# Patient Record
Sex: Male | Born: 1950
Health system: Southern US, Community
[De-identification: ages and names within clinical notes are randomized; demographics above are authoritative.]

## PROBLEM LIST (undated history)

## (undated) DIAGNOSIS — J449 Chronic obstructive pulmonary disease, unspecified: Secondary | ICD-10-CM

## (undated) DIAGNOSIS — G8929 Other chronic pain: Secondary | ICD-10-CM

## (undated) DIAGNOSIS — R21 Rash and other nonspecific skin eruption: Secondary | ICD-10-CM

## (undated) DIAGNOSIS — I253 Aneurysm of heart: Secondary | ICD-10-CM

## (undated) DIAGNOSIS — R509 Fever, unspecified: Secondary | ICD-10-CM

## (undated) DIAGNOSIS — R739 Hyperglycemia, unspecified: Secondary | ICD-10-CM

## (undated) DIAGNOSIS — J9601 Acute respiratory failure with hypoxia: Secondary | ICD-10-CM

## (undated) DIAGNOSIS — R1310 Dysphagia, unspecified: Secondary | ICD-10-CM

## (undated) DIAGNOSIS — R570 Cardiogenic shock: Secondary | ICD-10-CM

## (undated) DIAGNOSIS — R0989 Other specified symptoms and signs involving the circulatory and respiratory systems: Secondary | ICD-10-CM

## (undated) DIAGNOSIS — J810 Acute pulmonary edema: Secondary | ICD-10-CM

## (undated) DIAGNOSIS — I5041 Acute combined systolic (congestive) and diastolic (congestive) heart failure: Secondary | ICD-10-CM

## (undated) DIAGNOSIS — D72829 Elevated white blood cell count, unspecified: Secondary | ICD-10-CM

## (undated) DIAGNOSIS — Z9981 Dependence on supplemental oxygen: Secondary | ICD-10-CM

## (undated) DIAGNOSIS — J029 Acute pharyngitis, unspecified: Secondary | ICD-10-CM

## (undated) DIAGNOSIS — R0902 Hypoxemia: Secondary | ICD-10-CM

## (undated) DIAGNOSIS — R410 Disorientation, unspecified: Secondary | ICD-10-CM

## (undated) DIAGNOSIS — E876 Hypokalemia: Secondary | ICD-10-CM

## (undated) DIAGNOSIS — I2109 ST elevation (STEMI) myocardial infarction involving other coronary artery of anterior wall: Secondary | ICD-10-CM

## (undated) DIAGNOSIS — R5381 Other malaise: Secondary | ICD-10-CM

## (undated) DIAGNOSIS — I2510 Atherosclerotic heart disease of native coronary artery without angina pectoris: Secondary | ICD-10-CM

## (undated) DIAGNOSIS — I5022 Chronic systolic (congestive) heart failure: Secondary | ICD-10-CM

## (undated) DIAGNOSIS — Z4509 Encounter for adjustment and management of other cardiac device: Secondary | ICD-10-CM

## (undated) DIAGNOSIS — Z1211 Encounter for screening for malignant neoplasm of colon: Secondary | ICD-10-CM

## (undated) DIAGNOSIS — I213 ST elevation (STEMI) myocardial infarction of unspecified site: Secondary | ICD-10-CM

## (undated) DIAGNOSIS — I238 Other current complications following acute myocardial infarction: Secondary | ICD-10-CM

## (undated) DIAGNOSIS — I10 Essential (primary) hypertension: Secondary | ICD-10-CM

## (undated) DIAGNOSIS — A419 Sepsis, unspecified organism: Secondary | ICD-10-CM

## (undated) DIAGNOSIS — R7881 Bacteremia: Secondary | ICD-10-CM

## (undated) DIAGNOSIS — Z452 Encounter for adjustment and management of vascular access device: Secondary | ICD-10-CM

## (undated) DIAGNOSIS — R0682 Tachypnea, not elsewhere classified: Secondary | ICD-10-CM

## (undated) DIAGNOSIS — G931 Anoxic brain damage, not elsewhere classified: Secondary | ICD-10-CM

## (undated) DIAGNOSIS — M542 Cervicalgia: Secondary | ICD-10-CM

## (undated) DIAGNOSIS — I249 Acute ischemic heart disease, unspecified: Secondary | ICD-10-CM

## (undated) DIAGNOSIS — D62 Acute posthemorrhagic anemia: Secondary | ICD-10-CM

## (undated) DIAGNOSIS — I509 Heart failure, unspecified: Secondary | ICD-10-CM

## (undated) DIAGNOSIS — I952 Hypotension due to drugs: Secondary | ICD-10-CM

## (undated) HISTORY — DX: Atherosclerotic heart disease of native coronary artery without angina pectoris: I25.10

## (undated) HISTORY — DX: ST elevation (STEMI) myocardial infarction of unspecified site: I21.3

## (undated) HISTORY — DX: Dependence on supplemental oxygen: Z99.81

## (undated) HISTORY — DX: Other malaise: R53.81

## (undated) HISTORY — DX: Encounter for screening for malignant neoplasm of colon: Z12.11

## (undated) HISTORY — DX: Bacteremia: R78.81

## (undated) HISTORY — DX: Acute combined systolic (congestive) and diastolic (congestive) heart failure: I50.41

## (undated) HISTORY — DX: Acute ischemic heart disease, unspecified: I24.9

## (undated) HISTORY — DX: Disorientation, unspecified: R41.0

## (undated) HISTORY — DX: Cervicalgia: M54.2

## (undated) HISTORY — DX: Other chronic pain: G89.29

## (undated) HISTORY — DX: Heart failure, unspecified: I50.9

## (undated) HISTORY — DX: Hypokalemia: E87.6

## (undated) HISTORY — DX: Cardiogenic shock: R57.0

## (undated) HISTORY — DX: Elevated white blood cell count, unspecified: D72.829

## (undated) HISTORY — DX: Acute pulmonary edema: J81.0

## (undated) HISTORY — DX: Encounter for adjustment and management of vascular access device: Z45.2

## (undated) HISTORY — DX: Other specified symptoms and signs involving the circulatory and respiratory systems: R09.89

## (undated) HISTORY — DX: Encounter for adjustment and management of other cardiac device: Z45.09

## (undated) HISTORY — PX: CARDIAC CATHETERIZATION: SHX172

## (undated) HISTORY — DX: Hypoxemia: R09.02

## (undated) HISTORY — DX: Dysphagia, unspecified: R13.10

## (undated) HISTORY — DX: Acute pharyngitis, unspecified: J02.9

## (undated) HISTORY — DX: Chronic obstructive pulmonary disease, unspecified: J44.9

## (undated) HISTORY — DX: Chronic systolic (congestive) heart failure: I50.22

## (undated) HISTORY — DX: Acute posthemorrhagic anemia: D62

## (undated) HISTORY — DX: Hyperglycemia, unspecified: R73.9

## (undated) HISTORY — DX: Essential (primary) hypertension: I10

## (undated) HISTORY — DX: Acute respiratory failure with hypoxia: J96.01

## (undated) HISTORY — DX: Sepsis, unspecified organism: A41.9

## (undated) HISTORY — DX: Fever, unspecified: R50.9

## (undated) HISTORY — DX: Anoxic brain damage, not elsewhere classified: G93.1

## (undated) HISTORY — DX: Hypotension due to drugs: I95.2

## (undated) HISTORY — DX: Other current complications following acute myocardial infarction: I23.8

## (undated) HISTORY — DX: ST elevation (STEMI) myocardial infarction involving other coronary artery of anterior wall: I21.09

## (undated) HISTORY — DX: Tachypnea, not elsewhere classified: R06.82

## (undated) HISTORY — DX: Rash and other nonspecific skin eruption: R21

## (undated) HISTORY — DX: Other current complications following acute myocardial infarction: I25.3

---

## 2003-04-03 ENCOUNTER — Emergency Department (HOSPITAL_COMMUNITY): Admission: EM | Admit: 2003-04-03 | Discharge: 2003-04-04 | Payer: Self-pay | Admitting: Emergency Medicine

## 2010-05-11 ENCOUNTER — Inpatient Hospital Stay (HOSPITAL_COMMUNITY)
Admission: EM | Admit: 2010-05-11 | Discharge: 2010-05-16 | Payer: Self-pay | Source: Home / Self Care | Attending: Orthopedic Surgery | Admitting: Orthopedic Surgery

## 2010-06-10 ENCOUNTER — Emergency Department (HOSPITAL_COMMUNITY)
Admission: EM | Admit: 2010-06-10 | Discharge: 2010-06-10 | Payer: Self-pay | Source: Home / Self Care | Admitting: Emergency Medicine

## 2010-07-16 NOTE — Discharge Summary (Signed)
NAMESHAILEN, THIELEN NO.:  [id number]  MEDICAL RECORD NO.:  [id number]          PATIENT TYPE:  INP  LOCATION:  5033                         FACILITY:  [facility]  PHYSICIAN:  Dionne Ano. Rayn Enderson, M.D.DATE OF BIRTH:  [birth year]  DATE OF ADMISSION:  05/11/2010 DATE OF DISCHARGE:  05/16/2010                              DISCHARGE SUMMARY   ADMITTING DIAGNOSES: 1. Severe mangling injury to the left forearm with significant soft     tissue disarray secondary to dog bite. 2. History of hypertension. 3. History of tobacco abuse. 4. History of chronic back pain. 5. History of atypical chest pain with questionable history of chronic     obstructive pulmonary disease.  SURGEON:  Dionne Ano. Amanda Pea, MD  CONSULTATIONS:  [facility] per Dr. Darnelle Catalan.  PROCEDURE:  Multiple incision and debridements of the forearm with ultimate full thickness skin grafting to the significant soft tissue defects.  BRIEF HISTORY OF PRESENT ILLNESS:  Mr. Desroches is a very pleasant gentleman who was seen and evaluated at request of the emergency room at [facility]  He is a pleasant 60 year old gentleman who unfortunately sustained a severe dog bite to the left forearm who had notable soft tissue disarray present.  This was a severe mangling injury.  Given the severity of hand and upper extremity, surgeon Dominica Severin was asked to see in regards to hand surgery consultation.  The patient's past medical history was updated given this significant soft tissue disarray. We discussed with him the recommendations for urgent I and D, admittance to the hospital to prevent infection, and possibility for repeat I and Ds into the future and ultimate closure with possible skin grafting. Given his past medical history, [facility] was consulted for medical clearance and did follow him along during his inpatient admit. Preoperative labs in terms of hemogram were noted to be stable without any  significant abnormalities.  Routine chemistry was relatively within normal limits.  EKG was performed with normal sinus rhythm noted without acute abnormalities present.  Radiographs showed soft tissue defects about the forearm.  No obvious bony abnormalities.  Preoperative chest x- ray showed minimal bibasilar atelectasis, mild peribronchial thickening, otherwise lungs were noted to be clear.  HOSPITAL COURSE:  Mr. Veltre was admitted on May 12, 2011 with the above-mentioned diagnosis.  Given the severe soft tissue injury, he was taken urgently to the emergency room where he underwent irrigation and excisional debridement of the significant soft tissue disarray.  He was admitted to the hospital for IV antibiotics, pain management, and for tentative reconstruction to be performed to the upper extremity.  He did very well during his postop period.  On postop day #1, he was doing well.  Vital signs were stable.  He was afebrile.  He was tolerating p.o.'s without difficulty.  He had expected dysesthesia of the upper extremity given the significant soft tissue disarray.  His chest was clear to auscultation.  Heart was regular rate and rhythm.  The vacuum- assisted closure device was noted be intact which was placed intraoperatively and was functioning without difficulties.  Therapy had been contacted for ambulatory purposes  and ADLs.  Decision was made to proceed to the operative room for repeat I and D to the upper extremity. The patient medically was stable and control of his hypertensive issues was noted on atenolol 50 mg daily.  On May 14, 2010, he returned to the operative suite undergoing second I and D with closure utilizing split-thickness skin graft to the operative site.  He tolerated the procedure very well and there were no complication.  Continued admittance was noted for IV antibiotics and continued wound care.  The patient remained medically stable.  On May 16, 2010, he was doing very well, was not having difficulties, his pain was controlled, he was tolerating p.o.'s without difficulties, he was voiding without difficulty, and was noted to have a bowel movement.  He denied nausea, vomiting, fevers, chills, shortness of breath, or chest pain.  His head was atraumatic and normocephalic.  Chest was clear to auscultation bilaterally.  Heart was S1-S2.  Abdomen was soft and nontender.  Bowel sounds were positive.  Evaluation of the upper extremity showed neurovascularly he was intact.  Median nerve appeared intact.  He was noted to have dysesthesias about superficial radial nerve and lateral antebrachial cutaneous regions.  He had no signs of infection or cellulitis.  Evaluation of the left thigh where the split-thickness skin graft was harvested showed that the dressings were intact.  No cellulitic process was noted.  He had been performing hair dryer treatment as directed throughout his hospital stay without difficulties. The wound margins were drying appropriately  ASSESSMENT: 1. Status post severe mangling injury to the left forearm secondary to     a dog bite requiring multiple incision and debridements and stayed     reconstruction of left forearm with subsequent split thickness skin     graft supplied from the thigh to the forearm. 2. History of hypertension with prior poor compliance, currently     controlled while inpatient on metoprolol. 3. Questionable history of chronic obstructive pulmonary disease. 4. History of chronic low back pain.  PLAN:  Decision was made to discharge Mr. [name] home.  CONDITION ON DISCHARGE:  He was noted to be improved.  We recommended a DASH diet, increasing his sodium, increasing fiber, fruits, and vegetables, and decreasing his fat foods.  In regards to the activities of the left upper extremities, he will keep his dressings clean, dry, and intact, elevating his hand frequently and wearing his sling  when he is ambulatory.  DISCHARGE MEDICATIONS: 1. Augmentin 875 mg 1 p.o. b.i.d. for 10 days. 2. Percocet 5/325 one to two p.o. q.4-6 p.r.n. #50. 3. Atenolol 50 mg 1 p.o. daily with 2 refills.  DISCHARGE INSTRUCTIONS:  He does understand he will have to establish a followup care with his family physician for further refills.  In regards to wound care, he is going to change his dressings daily, utilizing air dryer treatments as directed 10-15 minutes 4-5 times daily.  He is going to follow up with Dr. Amanda Pea next Friday afternoon at 1:15 to take his dressings down since his wound looks good. We discussed the need to follow up with Dr. Madaline Guthrie for any cardiac issues and hypertensive issues at which as he had seen him in the distant past.  All questions were encouraged and answered.     Karie Chimera, P.A.-C.   ______________________________ Dionne Ano. Amanda Pea, M.D.    BB/MEDQ  D:  06/30/2010  T:  07/01/2010  Job:  [id number]  Electronically Signed by Karie Chimera  P.A.-C. on 07/12/2010 01:06:12 PM Electronically Signed by Dominica Severin M.D. on 07/16/2010 05:54:03 AM

## 2010-08-09 LAB — CBC
HCT: 42.6 % (ref 39.0–52.0)
Hemoglobin: 14.1 g/dL (ref 13.0–17.0)
MCH: 30 pg (ref 26.0–34.0)
MCHC: 33.1 g/dL (ref 30.0–36.0)
MCV: 90.6 fL (ref 78.0–100.0)
Platelets: 213 10*3/uL (ref 150–400)
RBC: 4.7 MIL/uL (ref 4.22–5.81)
RDW: 13.5 % (ref 11.5–15.5)
WBC: 11.1 10*3/uL — ABNORMAL HIGH (ref 4.0–10.5)

## 2010-08-09 LAB — BASIC METABOLIC PANEL
BUN: 7 mg/dL (ref 6–23)
CO2: 26 mEq/L (ref 19–32)
Calcium: 8.9 mg/dL (ref 8.4–10.5)
Chloride: 108 mEq/L (ref 96–112)
Creatinine, Ser: 0.92 mg/dL (ref 0.4–1.5)
GFR calc Af Amer: >60 mL/min (ref >60)
GFR calc non Af Amer: >60 mL/min (ref >60)
Glucose, Bld: 105 mg/dL — ABNORMAL HIGH (ref 70–99)
Potassium: 3.8 mEq/L (ref 3.5–5.1)
Sodium: 141 mEq/L (ref 135–145)

## 2010-08-10 LAB — DIFFERENTIAL
Basophils Absolute: 0 10*3/uL (ref 0.0–0.1)
Basophils Relative: 0 % (ref 0–1)
Eosinophils Absolute: 0.1 10*3/uL (ref 0.0–0.7)
Eosinophils Relative: 1 % (ref 0–5)
Lymphocytes Relative: 13 % (ref 12–46)
Lymphs Abs: 1.5 10*3/uL (ref 0.7–4.0)
Monocytes Absolute: 0.7 10*3/uL (ref 0.1–1.0)
Monocytes Relative: 6 % (ref 3–12)
Neutro Abs: 9.5 10*3/uL — ABNORMAL HIGH (ref 1.7–7.7)
Neutrophils Relative %: 80 % — ABNORMAL HIGH (ref 43–77)

## 2010-08-10 LAB — BASIC METABOLIC PANEL
BUN: 9 mg/dL (ref 6–23)
CO2: 24 mEq/L (ref 19–32)
Calcium: 9 mg/dL (ref 8.4–10.5)
Chloride: 104 mEq/L (ref 96–112)
Creatinine, Ser: 0.9 mg/dL (ref 0.4–1.5)
GFR calc Af Amer: >60 mL/min (ref >60)
GFR calc non Af Amer: >60 mL/min (ref >60)
Glucose, Bld: 106 mg/dL — ABNORMAL HIGH (ref 70–99)
Potassium: 4.5 mEq/L (ref 3.5–5.1)
Sodium: 134 mEq/L — ABNORMAL LOW (ref 135–145)

## 2010-08-10 LAB — CBC
HCT: 50.8 % (ref 39.0–52.0)
Hemoglobin: 16.9 g/dL (ref 13.0–17.0)
MCH: 30 pg (ref 26.0–34.0)
MCHC: 33.3 g/dL (ref 30.0–36.0)
MCV: 90.2 fL (ref 78.0–100.0)
Platelets: 212 10*3/uL (ref 150–400)
RBC: 5.63 MIL/uL (ref 4.22–5.81)
RDW: 13.6 % (ref 11.5–15.5)
WBC: 11.8 10*3/uL — ABNORMAL HIGH (ref 4.0–10.5)

## 2010-08-10 LAB — PROTIME-INR
INR: 1.05 (ref 0.00–1.49)
Prothrombin Time: 13.9 seconds (ref 11.6–15.2)

## 2010-08-10 LAB — APTT: aPTT: 31 seconds (ref 24–37)

## 2017-04-11 ENCOUNTER — Encounter (HOSPITAL_COMMUNITY): Payer: Self-pay | Admitting: Emergency Medicine

## 2017-04-11 ENCOUNTER — Emergency Department (HOSPITAL_COMMUNITY): Payer: Medicare Other

## 2017-04-11 ENCOUNTER — Inpatient Hospital Stay (HOSPITAL_COMMUNITY)
Admission: EM | Disposition: A | Discharge status: Rehabilitation Facility | Payer: Self-pay | Source: Home / Self Care | Attending: Cardiothoracic Surgery

## 2017-04-11 ENCOUNTER — Inpatient Hospital Stay (HOSPITAL_COMMUNITY)
Admission: EM | Admit: 2017-04-11 | Discharge: 2017-04-27 | DRG: 001 | Disposition: A | Discharge status: Rehabilitation Facility | Payer: Medicare Other | Attending: Cardiothoracic Surgery | Admitting: Cardiothoracic Surgery

## 2017-04-11 ENCOUNTER — Other Ambulatory Visit: Payer: Self-pay

## 2017-04-11 DIAGNOSIS — I249 Acute ischemic heart disease, unspecified: Secondary | ICD-10-CM

## 2017-04-11 DIAGNOSIS — I110 Hypertensive heart disease with heart failure: Secondary | ICD-10-CM | POA: Diagnosis present

## 2017-04-11 DIAGNOSIS — R410 Disorientation, unspecified: Secondary | ICD-10-CM | POA: Diagnosis not present

## 2017-04-11 DIAGNOSIS — N183 Chronic kidney disease, stage 3 (moderate): Secondary | ICD-10-CM

## 2017-04-11 DIAGNOSIS — R509 Fever, unspecified: Secondary | ICD-10-CM | POA: Diagnosis not present

## 2017-04-11 DIAGNOSIS — F17210 Nicotine dependence, cigarettes, uncomplicated: Secondary | ICD-10-CM | POA: Diagnosis present

## 2017-04-11 DIAGNOSIS — Z720 Tobacco use: Secondary | ICD-10-CM

## 2017-04-11 DIAGNOSIS — I5023 Acute on chronic systolic (congestive) heart failure: Secondary | ICD-10-CM | POA: Diagnosis present

## 2017-04-11 DIAGNOSIS — I252 Old myocardial infarction: Secondary | ICD-10-CM

## 2017-04-11 DIAGNOSIS — R0682 Tachypnea, not elsewhere classified: Secondary | ICD-10-CM | POA: Diagnosis not present

## 2017-04-11 DIAGNOSIS — Z9689 Presence of other specified functional implants: Secondary | ICD-10-CM

## 2017-04-11 DIAGNOSIS — M542 Cervicalgia: Secondary | ICD-10-CM | POA: Diagnosis not present

## 2017-04-11 DIAGNOSIS — J810 Acute pulmonary edema: Secondary | ICD-10-CM

## 2017-04-11 DIAGNOSIS — R7881 Bacteremia: Secondary | ICD-10-CM | POA: Diagnosis not present

## 2017-04-11 DIAGNOSIS — Z9861 Coronary angioplasty status: Secondary | ICD-10-CM | POA: Diagnosis not present

## 2017-04-11 DIAGNOSIS — I2510 Atherosclerotic heart disease of native coronary artery without angina pectoris: Secondary | ICD-10-CM | POA: Diagnosis not present

## 2017-04-11 DIAGNOSIS — I480 Paroxysmal atrial fibrillation: Secondary | ICD-10-CM | POA: Diagnosis not present

## 2017-04-11 DIAGNOSIS — A419 Sepsis, unspecified organism: Secondary | ICD-10-CM

## 2017-04-11 DIAGNOSIS — J9811 Atelectasis: Secondary | ICD-10-CM | POA: Diagnosis not present

## 2017-04-11 DIAGNOSIS — R451 Restlessness and agitation: Secondary | ICD-10-CM | POA: Diagnosis not present

## 2017-04-11 DIAGNOSIS — R918 Other nonspecific abnormal finding of lung field: Secondary | ICD-10-CM | POA: Diagnosis not present

## 2017-04-11 DIAGNOSIS — J9690 Respiratory failure, unspecified, unspecified whether with hypoxia or hypercapnia: Secondary | ICD-10-CM | POA: Diagnosis not present

## 2017-04-11 DIAGNOSIS — Y838 Other surgical procedures as the cause of abnormal reaction of the patient, or of later complication, without mention of misadventure at the time of the procedure: Secondary | ICD-10-CM | POA: Diagnosis not present

## 2017-04-11 DIAGNOSIS — G9349 Other encephalopathy: Secondary | ICD-10-CM | POA: Diagnosis present

## 2017-04-11 DIAGNOSIS — I238 Other current complications following acute myocardial infarction: Secondary | ICD-10-CM | POA: Diagnosis not present

## 2017-04-11 DIAGNOSIS — G931 Anoxic brain damage, not elsewhere classified: Secondary | ICD-10-CM | POA: Diagnosis present

## 2017-04-11 DIAGNOSIS — I2102 ST elevation (STEMI) myocardial infarction involving left anterior descending coronary artery: Secondary | ICD-10-CM

## 2017-04-11 DIAGNOSIS — Z4509 Encounter for adjustment and management of other cardiac device: Secondary | ICD-10-CM | POA: Diagnosis not present

## 2017-04-11 DIAGNOSIS — I509 Heart failure, unspecified: Secondary | ICD-10-CM | POA: Diagnosis not present

## 2017-04-11 DIAGNOSIS — J449 Chronic obstructive pulmonary disease, unspecified: Secondary | ICD-10-CM | POA: Diagnosis not present

## 2017-04-11 DIAGNOSIS — R4587 Impulsiveness: Secondary | ICD-10-CM | POA: Diagnosis not present

## 2017-04-11 DIAGNOSIS — J438 Other emphysema: Secondary | ICD-10-CM | POA: Diagnosis not present

## 2017-04-11 DIAGNOSIS — I2582 Chronic total occlusion of coronary artery: Secondary | ICD-10-CM | POA: Diagnosis not present

## 2017-04-11 DIAGNOSIS — Z4682 Encounter for fitting and adjustment of non-vascular catheter: Secondary | ICD-10-CM | POA: Diagnosis not present

## 2017-04-11 DIAGNOSIS — I952 Hypotension due to drugs: Secondary | ICD-10-CM | POA: Diagnosis not present

## 2017-04-11 DIAGNOSIS — Z955 Presence of coronary angioplasty implant and graft: Secondary | ICD-10-CM | POA: Diagnosis not present

## 2017-04-11 DIAGNOSIS — I4901 Ventricular fibrillation: Secondary | ICD-10-CM | POA: Diagnosis not present

## 2017-04-11 DIAGNOSIS — Z481 Encounter for planned postprocedural wound closure: Secondary | ICD-10-CM | POA: Diagnosis not present

## 2017-04-11 DIAGNOSIS — R011 Cardiac murmur, unspecified: Secondary | ICD-10-CM | POA: Diagnosis not present

## 2017-04-11 DIAGNOSIS — R739 Hyperglycemia, unspecified: Secondary | ICD-10-CM | POA: Diagnosis present

## 2017-04-11 DIAGNOSIS — J9383 Other pneumothorax: Secondary | ICD-10-CM | POA: Diagnosis not present

## 2017-04-11 DIAGNOSIS — G8929 Other chronic pain: Secondary | ICD-10-CM

## 2017-04-11 DIAGNOSIS — B9620 Unspecified Escherichia coli [E. coli] as the cause of diseases classified elsewhere: Secondary | ICD-10-CM | POA: Diagnosis not present

## 2017-04-11 DIAGNOSIS — Z1612 Extended spectrum beta lactamase (ESBL) resistance: Secondary | ICD-10-CM | POA: Diagnosis not present

## 2017-04-11 DIAGNOSIS — I313 Pericardial effusion (noninflammatory): Secondary | ICD-10-CM | POA: Diagnosis not present

## 2017-04-11 DIAGNOSIS — I2101 ST elevation (STEMI) myocardial infarction involving left main coronary artery: Secondary | ICD-10-CM | POA: Diagnosis not present

## 2017-04-11 DIAGNOSIS — R55 Syncope and collapse: Secondary | ICD-10-CM | POA: Diagnosis not present

## 2017-04-11 DIAGNOSIS — T82868A Thrombosis of vascular prosthetic devices, implants and grafts, initial encounter: Secondary | ICD-10-CM | POA: Diagnosis not present

## 2017-04-11 DIAGNOSIS — R0902 Hypoxemia: Secondary | ICD-10-CM | POA: Diagnosis not present

## 2017-04-11 DIAGNOSIS — J9601 Acute respiratory failure with hypoxia: Secondary | ICD-10-CM

## 2017-04-11 DIAGNOSIS — E871 Hypo-osmolality and hyponatremia: Secondary | ICD-10-CM | POA: Diagnosis not present

## 2017-04-11 DIAGNOSIS — A499 Bacterial infection, unspecified: Secondary | ICD-10-CM | POA: Diagnosis not present

## 2017-04-11 DIAGNOSIS — R0989 Other specified symptoms and signs involving the circulatory and respiratory systems: Secondary | ICD-10-CM | POA: Diagnosis not present

## 2017-04-11 DIAGNOSIS — I080 Rheumatic disorders of both mitral and aortic valves: Secondary | ICD-10-CM | POA: Diagnosis present

## 2017-04-11 DIAGNOSIS — N170 Acute kidney failure with tubular necrosis: Secondary | ICD-10-CM | POA: Diagnosis not present

## 2017-04-11 DIAGNOSIS — E874 Mixed disorder of acid-base balance: Secondary | ICD-10-CM | POA: Diagnosis not present

## 2017-04-11 DIAGNOSIS — E1165 Type 2 diabetes mellitus with hyperglycemia: Secondary | ICD-10-CM | POA: Diagnosis not present

## 2017-04-11 DIAGNOSIS — Z09 Encounter for follow-up examination after completed treatment for conditions other than malignant neoplasm: Secondary | ICD-10-CM

## 2017-04-11 DIAGNOSIS — R9431 Abnormal electrocardiogram [ECG] [EKG]: Secondary | ICD-10-CM | POA: Diagnosis not present

## 2017-04-11 DIAGNOSIS — R7989 Other specified abnormal findings of blood chemistry: Secondary | ICD-10-CM | POA: Diagnosis not present

## 2017-04-11 DIAGNOSIS — I233 Rupture of cardiac wall without hemopericardium as current complication following acute myocardial infarction: Secondary | ICD-10-CM | POA: Diagnosis not present

## 2017-04-11 DIAGNOSIS — I253 Aneurysm of heart: Secondary | ICD-10-CM | POA: Diagnosis not present

## 2017-04-11 DIAGNOSIS — Z1211 Encounter for screening for malignant neoplasm of colon: Secondary | ICD-10-CM

## 2017-04-11 DIAGNOSIS — R1310 Dysphagia, unspecified: Secondary | ICD-10-CM | POA: Diagnosis not present

## 2017-04-11 DIAGNOSIS — I10 Essential (primary) hypertension: Secondary | ICD-10-CM | POA: Diagnosis not present

## 2017-04-11 DIAGNOSIS — I5041 Acute combined systolic (congestive) and diastolic (congestive) heart failure: Secondary | ICD-10-CM

## 2017-04-11 DIAGNOSIS — J029 Acute pharyngitis, unspecified: Secondary | ICD-10-CM | POA: Diagnosis not present

## 2017-04-11 DIAGNOSIS — Z978 Presence of other specified devices: Secondary | ICD-10-CM

## 2017-04-11 DIAGNOSIS — J811 Chronic pulmonary edema: Secondary | ICD-10-CM | POA: Diagnosis not present

## 2017-04-11 DIAGNOSIS — D62 Acute posthemorrhagic anemia: Secondary | ICD-10-CM | POA: Diagnosis not present

## 2017-04-11 DIAGNOSIS — I340 Nonrheumatic mitral (valve) insufficiency: Secondary | ICD-10-CM | POA: Diagnosis not present

## 2017-04-11 DIAGNOSIS — Z951 Presence of aortocoronary bypass graft: Secondary | ICD-10-CM | POA: Diagnosis not present

## 2017-04-11 DIAGNOSIS — N390 Urinary tract infection, site not specified: Secondary | ICD-10-CM | POA: Diagnosis not present

## 2017-04-11 DIAGNOSIS — J90 Pleural effusion, not elsewhere classified: Secondary | ICD-10-CM

## 2017-04-11 DIAGNOSIS — R29898 Other symptoms and signs involving the musculoskeletal system: Secondary | ICD-10-CM | POA: Diagnosis not present

## 2017-04-11 DIAGNOSIS — R5381 Other malaise: Secondary | ICD-10-CM | POA: Diagnosis not present

## 2017-04-11 DIAGNOSIS — I5043 Acute on chronic combined systolic (congestive) and diastolic (congestive) heart failure: Secondary | ICD-10-CM | POA: Diagnosis not present

## 2017-04-11 DIAGNOSIS — E876 Hypokalemia: Secondary | ICD-10-CM | POA: Diagnosis present

## 2017-04-11 DIAGNOSIS — I2109 ST elevation (STEMI) myocardial infarction involving other coronary artery of anterior wall: Secondary | ICD-10-CM | POA: Diagnosis not present

## 2017-04-11 DIAGNOSIS — R0602 Shortness of breath: Secondary | ICD-10-CM | POA: Diagnosis not present

## 2017-04-11 DIAGNOSIS — F419 Anxiety disorder, unspecified: Secondary | ICD-10-CM | POA: Diagnosis not present

## 2017-04-11 DIAGNOSIS — D72829 Elevated white blood cell count, unspecified: Secondary | ICD-10-CM | POA: Diagnosis not present

## 2017-04-11 DIAGNOSIS — I97190 Other postprocedural cardiac functional disturbances following cardiac surgery: Secondary | ICD-10-CM | POA: Diagnosis not present

## 2017-04-11 DIAGNOSIS — G9340 Encephalopathy, unspecified: Secondary | ICD-10-CM | POA: Diagnosis not present

## 2017-04-11 DIAGNOSIS — I5022 Chronic systolic (congestive) heart failure: Secondary | ICD-10-CM | POA: Diagnosis present

## 2017-04-11 DIAGNOSIS — E872 Acidosis: Secondary | ICD-10-CM | POA: Diagnosis not present

## 2017-04-11 DIAGNOSIS — Z9289 Personal history of other medical treatment: Secondary | ICD-10-CM

## 2017-04-11 DIAGNOSIS — I5021 Acute systolic (congestive) heart failure: Secondary | ICD-10-CM | POA: Diagnosis not present

## 2017-04-11 DIAGNOSIS — R570 Cardiogenic shock: Secondary | ICD-10-CM | POA: Diagnosis present

## 2017-04-11 DIAGNOSIS — R21 Rash and other nonspecific skin eruption: Secondary | ICD-10-CM | POA: Diagnosis not present

## 2017-04-11 DIAGNOSIS — Z9981 Dependence on supplemental oxygen: Secondary | ICD-10-CM | POA: Diagnosis not present

## 2017-04-11 DIAGNOSIS — I161 Hypertensive emergency: Secondary | ICD-10-CM

## 2017-04-11 DIAGNOSIS — Z4659 Encounter for fitting and adjustment of other gastrointestinal appliance and device: Secondary | ICD-10-CM

## 2017-04-11 DIAGNOSIS — D696 Thrombocytopenia, unspecified: Secondary | ICD-10-CM | POA: Diagnosis not present

## 2017-04-11 DIAGNOSIS — I2111 ST elevation (STEMI) myocardial infarction involving right coronary artery: Secondary | ICD-10-CM | POA: Diagnosis not present

## 2017-04-11 DIAGNOSIS — I213 ST elevation (STEMI) myocardial infarction of unspecified site: Secondary | ICD-10-CM

## 2017-04-11 DIAGNOSIS — Z452 Encounter for adjustment and management of vascular access device: Secondary | ICD-10-CM | POA: Diagnosis not present

## 2017-04-11 DIAGNOSIS — I081 Rheumatic disorders of both mitral and tricuspid valves: Secondary | ICD-10-CM | POA: Diagnosis not present

## 2017-04-11 DIAGNOSIS — I519 Heart disease, unspecified: Secondary | ICD-10-CM | POA: Diagnosis not present

## 2017-04-11 HISTORY — PX: CORONARY THROMBECTOMY: CATH118304

## 2017-04-11 HISTORY — PX: CORONARY STENT INTERVENTION: CATH118234

## 2017-04-11 HISTORY — DX: Atherosclerotic heart disease of native coronary artery without angina pectoris: I25.10

## 2017-04-11 HISTORY — DX: Essential (primary) hypertension: I10

## 2017-04-11 HISTORY — PX: ULTRASOUND GUIDANCE FOR VASCULAR ACCESS: SHX6516

## 2017-04-11 HISTORY — PX: IABP INSERTION: CATH118242

## 2017-04-11 HISTORY — DX: ST elevation (STEMI) myocardial infarction of unspecified site: I21.3

## 2017-04-11 HISTORY — PX: CORONARY/GRAFT ACUTE MI REVASCULARIZATION: CATH118305

## 2017-04-11 HISTORY — DX: Chronic obstructive pulmonary disease, unspecified: J44.9

## 2017-04-11 HISTORY — PX: LEFT HEART CATH AND CORONARY ANGIOGRAPHY: CATH118249

## 2017-04-11 HISTORY — DX: Acute combined systolic (congestive) and diastolic (congestive) heart failure: I50.41

## 2017-04-11 LAB — PROTIME-INR
INR: 1.28
Prothrombin Time: 15.9 seconds — ABNORMAL HIGH (ref 11.4–15.2)

## 2017-04-11 LAB — CBC WITH DIFFERENTIAL/PLATELET
Basophils Absolute: 0.1 10*3/uL (ref 0.0–0.1)
Basophils Relative: 1 %
Eosinophils Absolute: 0.3 10*3/uL (ref 0.0–0.7)
Eosinophils Relative: 2 %
HCT: 46.7 % (ref 39.0–52.0)
Hemoglobin: 14.5 g/dL (ref 13.0–17.0)
Lymphocytes Relative: 40 %
Lymphs Abs: 5.5 10*3/uL — ABNORMAL HIGH (ref 0.7–4.0)
MCH: 28.9 pg (ref 26.0–34.0)
MCHC: 31 g/dL (ref 30.0–36.0)
MCV: 93 fL (ref 78.0–100.0)
Monocytes Absolute: 0.8 10*3/uL (ref 0.1–1.0)
Monocytes Relative: 6 %
Neutro Abs: 7.2 10*3/uL (ref 1.7–7.7)
Neutrophils Relative %: 51 %
Platelets: 235 10*3/uL (ref 150–400)
RBC: 5.02 MIL/uL (ref 4.22–5.81)
RDW: 14.3 % (ref 11.5–15.5)
WBC: 13.9 10*3/uL — ABNORMAL HIGH (ref 4.0–10.5)

## 2017-04-11 LAB — COMPREHENSIVE METABOLIC PANEL
ALT: 20 U/L (ref 17–63)
AST: 32 U/L (ref 15–41)
Albumin: 3.4 g/dL — ABNORMAL LOW (ref 3.5–5.0)
Alkaline Phosphatase: 86 U/L (ref 38–126)
Anion gap: 12 (ref 5–15)
BUN: 19 mg/dL (ref 6–20)
CO2: 19 mmol/L — ABNORMAL LOW (ref 22–32)
Calcium: 8.4 mg/dL — ABNORMAL LOW (ref 8.9–10.3)
Chloride: 107 mmol/L (ref 101–111)
Creatinine, Ser: 1.67 mg/dL — ABNORMAL HIGH (ref 0.61–1.24)
GFR calc Af Amer: 47 mL/min — ABNORMAL LOW (ref >60)
GFR calc non Af Amer: 40 mL/min — ABNORMAL LOW (ref >60)
Glucose, Bld: 249 mg/dL — ABNORMAL HIGH (ref 65–99)
Potassium: 4 mmol/L (ref 3.5–5.1)
Sodium: 138 mmol/L (ref 135–145)
Total Bilirubin: 0.5 mg/dL (ref 0.3–1.2)
Total Protein: 6.9 g/dL (ref 6.5–8.1)

## 2017-04-11 LAB — LIPID PANEL
Cholesterol: 167 mg/dL (ref 0–200)
HDL: 27 mg/dL — ABNORMAL LOW (ref >40)
LDL Cholesterol: 118 mg/dL — ABNORMAL HIGH (ref 0–99)
Total CHOL/HDL Ratio: 6.2 RATIO
Triglycerides: 109 mg/dL (ref <150)
VLDL: 22 mg/dL (ref 0–40)

## 2017-04-11 LAB — TROPONIN I: Troponin I: 0.09 ng/mL (ref <0.03)

## 2017-04-11 LAB — APTT: aPTT: 30 seconds (ref 24–36)

## 2017-04-11 SURGERY — CORONARY/GRAFT ACUTE MI REVASCULARIZATION
Anesthesia: LOCAL

## 2017-04-11 MED ORDER — LIDOCAINE HCL (PF) 1 % IJ SOLN
INTRAMUSCULAR | Status: AC
  Filled 2017-04-11: qty 30

## 2017-04-11 MED ORDER — NITROGLYCERIN 1 MG/10 ML FOR IR/CATH LAB
INTRA_ARTERIAL | Status: AC
  Filled 2017-04-11: qty 10

## 2017-04-11 MED ORDER — HEPARIN (PORCINE) IN NACL 2-0.9 UNIT/ML-% IJ SOLN
INTRAMUSCULAR | Status: AC | PRN
  Administered 2017-04-11: 500 mL

## 2017-04-11 MED ORDER — MIDAZOLAM HCL 2 MG/2ML IJ SOLN
1.0000 mg | INTRAMUSCULAR | Status: DC | PRN
  Administered 2017-04-12: 1 mg via INTRAVENOUS
  Filled 2017-04-11: qty 2

## 2017-04-11 MED ORDER — LIDOCAINE HCL (PF) 1 % IJ SOLN
INTRAMUSCULAR | Status: DC | PRN
  Administered 2017-04-11: 20 mL

## 2017-04-11 MED ORDER — MIDAZOLAM HCL 2 MG/2ML IJ SOLN
INTRAMUSCULAR | Status: AC
  Filled 2017-04-11: qty 2

## 2017-04-11 MED ORDER — MIDAZOLAM HCL 2 MG/2ML IJ SOLN
INTRAMUSCULAR | Status: DC | PRN
  Administered 2017-04-11 (×3): 2 mg via INTRAVENOUS

## 2017-04-11 MED ORDER — ADENOSINE 6 MG/2ML IV SOLN
INTRAVENOUS | Status: AC
  Filled 2017-04-11: qty 2

## 2017-04-11 MED ORDER — SODIUM CHLORIDE 0.9 % IV SOLN
INTRAVENOUS | Status: AC | PRN
  Administered 2017-04-11: 10 mL/h via INTRAVENOUS

## 2017-04-11 MED ORDER — FENTANYL BOLUS VIA INFUSION
25.0000 ug | INTRAVENOUS | Status: DC | PRN
  Filled 2017-04-11: qty 25

## 2017-04-11 MED ORDER — ADENOSINE (DIAGNOSTIC) FOR INTRACORONARY USE
INTRAVENOUS | Status: DC | PRN
  Administered 2017-04-11 (×8): 60 mg via INTRACORONARY

## 2017-04-11 MED ORDER — ASPIRIN 81 MG PO CHEW: 324.0000 mg | CHEWABLE_TABLET | Freq: Once | ORAL | Status: DC

## 2017-04-11 MED ORDER — ETOMIDATE 2 MG/ML IV SOLN
20.0000 mg | Freq: Once | INTRAVENOUS | Status: AC
  Administered 2017-04-11: 20 mg via INTRAVENOUS

## 2017-04-11 MED ORDER — ROCURONIUM BROMIDE 50 MG/5ML IV SOLN
75.0000 mg | Freq: Once | INTRAVENOUS | Status: AC
  Administered 2017-04-11: 75 mg via INTRAVENOUS

## 2017-04-11 MED ORDER — BIVALIRUDIN TRIFLUOROACETATE 250 MG IV SOLR
INTRAVENOUS | Status: AC
  Filled 2017-04-11: qty 250

## 2017-04-11 MED ORDER — FENTANYL 2500MCG IN NS 250ML (10MCG/ML) PREMIX INFUSION
25.0000 ug/h | INTRAVENOUS | Status: DC
  Administered 2017-04-11: 50 ug/h via INTRAVENOUS
  Administered 2017-04-12: 200 ug/h via INTRAVENOUS
  Filled 2017-04-11 (×2): qty 250

## 2017-04-11 MED ORDER — NITROGLYCERIN IN D5W 200-5 MCG/ML-% IV SOLN
0.0000 ug/min | Freq: Once | INTRAVENOUS | Status: AC
  Administered 2017-04-11: 5 ug/min via INTRAVENOUS

## 2017-04-11 MED ORDER — TICAGRELOR 90 MG PO TABS
ORAL_TABLET | ORAL | Status: DC | PRN
  Administered 2017-04-11: 180 mg via NASOGASTRIC

## 2017-04-11 MED ORDER — HEPARIN (PORCINE) IN NACL 2-0.9 UNIT/ML-% IJ SOLN
INTRAMUSCULAR | Status: AC
  Filled 2017-04-11: qty 1000

## 2017-04-11 MED ORDER — TICAGRELOR 90 MG PO TABS
ORAL_TABLET | ORAL | Status: AC
  Filled 2017-04-11: qty 2

## 2017-04-11 MED ORDER — NITROGLYCERIN 0.4 MG SL SUBL: 0.4000 mg | SUBLINGUAL_TABLET | SUBLINGUAL | Status: DC | PRN

## 2017-04-11 MED ORDER — HEPARIN SODIUM (PORCINE) 5000 UNIT/ML IJ SOLN
4000.0000 [IU] | Freq: Once | INTRAMUSCULAR | Status: AC
  Administered 2017-04-11: 4000 [IU] via INTRAVENOUS

## 2017-04-11 MED ORDER — MIDAZOLAM HCL 2 MG/2ML IJ SOLN
1.0000 mg | INTRAMUSCULAR | Status: AC | PRN
  Administered 2017-04-12: 4 mg via INTRAVENOUS
  Administered 2017-04-12: 1 mg via INTRAVENOUS
  Administered 2017-04-12 (×2): 2 mg via INTRAVENOUS
  Administered 2017-04-12 (×4): 1 mg via INTRAVENOUS
  Administered 2017-04-12: 2 mg via INTRAVENOUS
  Administered 2017-04-12: 1 mg via INTRAVENOUS
  Filled 2017-04-11 (×2): qty 2

## 2017-04-11 MED ORDER — NITROGLYCERIN 1 MG/10 ML FOR IR/CATH LAB
INTRA_ARTERIAL | Status: DC | PRN
  Administered 2017-04-11 (×2): 200 ug via INTRACORONARY

## 2017-04-11 MED ORDER — BIVALIRUDIN BOLUS VIA INFUSION - CUPID
INTRAVENOUS | Status: DC | PRN
  Administered 2017-04-11: 54.45 mg via INTRAVENOUS

## 2017-04-11 MED ORDER — IOPAMIDOL (ISOVUE-370) INJECTION 76%
INTRAVENOUS | Status: AC
  Filled 2017-04-11: qty 100

## 2017-04-11 MED ORDER — ADENOSINE 12 MG/4ML IV SOLN
INTRAVENOUS | Status: AC
  Filled 2017-04-11: qty 4

## 2017-04-11 MED ORDER — SODIUM CHLORIDE 0.9 % IV SOLN
INTRAVENOUS | Status: AC | PRN
  Administered 2017-04-11 (×2): 1.75 mg/kg/h via INTRAVENOUS

## 2017-04-11 MED ORDER — NITROGLYCERIN IN D5W 200-5 MCG/ML-% IV SOLN
INTRAVENOUS | Status: AC
  Filled 2017-04-11: qty 250

## 2017-04-11 SURGICAL SUPPLY — 25 items
BALLN LINEAR 7.5FR IABP 40CC (BALLOONS) ×3
BALLN SAPPHIRE 2.5X12 (BALLOONS) ×3
BALLN SAPPHIRE 2.75X20 (BALLOONS) ×3
BALLN SAPPHIRE ~~LOC~~ 3.75X18 (BALLOONS) ×3 IMPLANT
BALLOON LINEAR 7.5FR IABP 40CC (BALLOONS) ×2 IMPLANT
BALLOON SAPPHIRE 2.5X12 (BALLOONS) ×2 IMPLANT
BALLOON SAPPHIRE 2.75X20 (BALLOONS) ×2 IMPLANT
CATH EXTRAC PRONTO 5.5F 138CM (CATHETERS) ×3 IMPLANT
CATH INFINITI 5FR JL4 (CATHETERS) ×3 IMPLANT
CATH INFINITI JR4 5F (CATHETERS) ×3 IMPLANT
CATH LAUNCHER 6FR EBU3.5 (CATHETERS) ×3 IMPLANT
CATH VISTA GUIDE 6FR XBRCA (CATHETERS) ×3 IMPLANT
GLIDESHEATH SLEND SS 6F .021 (SHEATH) IMPLANT
GUIDEWIRE INQWIRE 1.5J.035X260 (WIRE) IMPLANT
INQWIRE 1.5J .035X260CM (WIRE)
KIT ENCORE 26 ADVANTAGE (KITS) ×3 IMPLANT
KIT HEART LEFT (KITS) ×3 IMPLANT
PACK CARDIAC CATHETERIZATION (CUSTOM PROCEDURE TRAY) ×3 IMPLANT
SHEATH PINNACLE 6F 10CM (SHEATH) ×3 IMPLANT
STENT SYNERGY DES 3.5X28 (Permanent Stent) ×3 IMPLANT
TRANSDUCER W/STOPCOCK (MISCELLANEOUS) ×3 IMPLANT
TUBING CIL FLEX 10 FLL-RA (TUBING) ×3 IMPLANT
VALVE GUARDIAN II ~~LOC~~ HEMO (MISCELLANEOUS) ×3 IMPLANT
WIRE ASAHI PROWATER 180CM (WIRE) ×6 IMPLANT
WIRE EMERALD 3MM-J .035X150CM (WIRE) ×3 IMPLANT

## 2017-04-11 NOTE — Consult Note (Signed)
PULMONARY / CRITICAL CARE MEDICINE   Name: Ronald HoveGary Casalino MRN: [id number] DOB: Jun 15, 1950    ADMISSION DATE:  04/11/2017 CONSULTATION DATE:  04/11/17  REFERRING MD:  Eldridge DaceVaranasi  CHIEF COMPLAINT:  STEMI  HISTORY OF PRESENT ILLNESS:  Pt is encephelopathic; therefore, this HPI is obtained from chart review. [name] is a 66 y.o. male with PMH of CAD and HTN.  [facility] with sudden onset of SOB and chest pain.  He was found to have inferolateral STEMI and was deemed to be cath lab candidate.  Prior to cath lab, he was intubated; therefore, PCCM asked to see in consultation to assist with vent management.  In cath lab, he was found to have 100% stenosed mid LAD lesion that was balloon angioplastied with 25% residual stenosis, distal RCA lesion which had DES placed, Ost 1st diag lesion 50% stenosed, proximal ramus 70% stenosed.  IABP was also placed.   PAST MEDICAL HISTORY :  He  has a past medical history of Coronary artery disease and Hypertension.  PAST SURGICAL HISTORY: He  has no past surgical history on file.  No Known Allergies  No current facility-administered medications on file prior to encounter.    No current outpatient medications on file prior to encounter.    FAMILY HISTORY:  His has no family status information on file.    SOCIAL HISTORY: He  reports that he has been smoking.  he has never used smokeless tobacco.  REVIEW OF SYSTEMS:   Unable to obtain as pt is encephalopathic.  SUBJECTIVE:  On vent, unresponsive.  VITAL SIGNS: BP 104/64 (BP Location: Left Arm)   Pulse (!) 119   Temp (!) 95.2 F (35.1 C) (Temporal)   Resp 14   Wt 72.6 kg (160 lb)   SpO2 100%   HEMODYNAMICS:    VENTILATOR SETTINGS: Vent Mode: PRVC FiO2 (%):  [100 %] 100 % Set Rate:  [14 bmp] 14 bmp Vt Set:  [580 mL] 580 mL PEEP:  [5 cmH20] 5 cmH20 Plateau Pressure:  [27 cmH20] 27 cmH20  INTAKE / OUTPUT: No intake/output data recorded.   PHYSICAL  EXAMINATION: General: Adult male, resting in bed, in NAD. Neuro: Sedated, non-responsive. HEENT: [place]/AT. PERRL, sclerae anicteric. Cardiovascular: RRR, no M/R/G.  Lungs: Respirations even and unlabored.  CTA bilaterally, No W/R/R. Abdomen: BS x 4, soft, NT/ND.  Musculoskeletal: No gross deformities, no edema.  Skin: Pale, warm, no rashes.   LABS:  BMET Recent Labs  Lab 04/11/17 2138  NA 138  K 4.0  CL 107  CO2 19*  BUN 19  CREATININE 1.67*  GLUCOSE 249*    Electrolytes Recent Labs  Lab 04/11/17 2138  CALCIUM 8.4*    CBC Recent Labs  Lab 04/11/17 2138  WBC 13.9*  HGB 14.5  HCT 46.7  PLT 235    Coag's Recent Labs  Lab 04/11/17 2138  APTT 30  INR 1.28    Sepsis Markers No results for input(s): LATICACIDVEN, PROCALCITON, O2SATVEN in the last 168 hours.  ABG No results for input(s): PHART, PCO2ART, PO2ART in the last 168 hours.  Liver Enzymes Recent Labs  Lab 04/11/17 2138  AST 32  ALT 20  ALKPHOS 86  BILITOT 0.5  ALBUMIN 3.4*    Cardiac Enzymes Recent Labs  Lab 04/11/17 2138  TROPONINI 0.09*    Glucose No results for input(s): GLUCAP in the last 168 hours.  Imaging Dg Chest Portable 1 View  Result Date: 04/11/2017 CLINICAL DATA:  Endotracheal tube  placement. EXAM: PORTABLE CHEST 1 VIEW COMPARISON:  Chest radiograph performed 05/11/2010 FINDINGS: The patient's endotracheal tube is seen ending 3-4 cm above the carina. An enteric tube is noted extending below the diaphragm. Diffuse bilateral airspace opacification is compatible with pneumonia, though superimposed pulmonary edema cannot be excluded. Vascular congestion is noted. No pleural effusion or pneumothorax is seen. The cardiomediastinal silhouette is mildly enlarged. No acute osseous abnormalities are seen. IMPRESSION: 1. Endotracheal tube seen ending 3-4 cm above the carina. 2. Diffuse bilateral airspace opacification, compatible with pneumonia. Superimposed pulmonary edema cannot be  excluded. 3. Vascular congestion and mild cardiomegaly. Electronically Signed   By: Roanna RaiderJeffery  Chang M.D.   On: 04/11/2017 22:25     STUDIES:  CXR 11/13 > possible PNA. Echo 11/14 >   CULTURES: Blood 11/13 >  Sputum 11/13 >   ANTIBIOTICS: Ceftriaxone 11/13 >  Azithromycin 11/13 >   SIGNIFICANT EVENTS: 11/13 > admit.  LINES/TUBES: ETT 11/13 >  R femoral sheath 11/13 >   DISCUSSION: 66 y.o. male admitted 11/13 with inferolateral STEMI.  He was intubated and was then taken to cath lab where he was found to have 100% stenosed mid LAD lesion that was balloon angioplastied with 25% residual stenosis, distal RCA lesion which had DES placed, Ost 1st diag lesion 50% stenosed, proximal ramus 70% stenosed.  IABP was also placed. Post cath lab, he remained ventilated and PCCM was asked to assist with vent management.   ASSESSMENT / PLAN:  PULMONARY A: Respiratory insufficiency - required intubation prior to cath lab. Possible PNA. P:   Full vent support. Wean as able. VAP prevention measures. SBT in AM if able. Empiric abx. CXR in AM.  CARDIOVASCULAR A:  Inferolateral STEMI - s/p cath lab where he was found to have 100% stenosed mid LAD lesion that was balloon angioplastied with 25% residual stenosis, distal RCA lesion which had DES placed, Ost 1st diag lesion 50% stenosed, proximal ramus 70% stenosed.  IABP was also placed. Hypotension - suspect sedation related (was normotensive on arrival to ICU but was somewhat agitated so got additional sedation and later became hypotensive). Hx HTN, CAD. P:  Cardiology following / managing. Low dose neosynephrine through PIV. Might need CVL if continues to require vasopressors or high doses required. Follow echo.  RENAL A:   NAGMA. AKI. P:   HCO3 @ 75. BMP in AM.  GASTROINTESTINAL A:   GI prophylaxis. Nutrition. P:   SUP: Pantoprazole. NPO.  HEMATOLOGIC A:   VTE Prophylaxis. P:  SCD's / heparin. CBC in  AM.  INFECTIOUS A:   Possible PNA. P:   Abx as above (ceftriaxone / azithromycin).  Follow cultures as above.  ENDOCRINE A:   No acute issues. P:   No interventions required.  NEUROLOGIC A:   Sedation due to mechanical ventilation. P:   Sedation:  Fentanyl gtt / Midazolam PRN. RASS goal: 0 to -1. Daily WUA.  Family updated: None available.  Interdisciplinary Family Meeting v Palliative Care Meeting:  Due by: 04/17/17.  CC time: 40 min.   Rutherford Guysahul Finnley Lewis, GeorgiaPA - C  Pulmonary & Critical Care Medicine Pager: [phone]  or [phone] 04/12/2017, 12:35 AM

## 2017-04-11 NOTE — ED Notes (Signed)
Per Dr Eldridge DaceVaranasi patient to be intubated before cath lab.

## 2017-04-11 NOTE — ED Provider Notes (Signed)
[facility] Provider Note  CSN: [id number] Arrival date & time: 04/11/17 2131  Chief Complaint(s) Code STEMI  HPI Arlana HoveGary Klang is a 66 y.o. male with a history of hypertension presents to the emergency department with chest pain and shortness of breath that began several hours prior to arrival.  EMS was called to the patient's home and patient was diaphoretic, writhing due to pain, and communicative due to the pain.  They obtained an EKG and transmitted for evaluation.  On my initial evaluation patient had ST segment elevation in the inferior and lateral leads with likely reciprocal changes and leads I and aVL.  I instructed EMS to activate code STEMI.  Patient received nitroglycerin and aspirin in route.  EMS reported that the patient became less agitated after the nitroglycerin.  Remainder of history, ROS, and physical exam limited due to patient's condition (acuity of condition). Additional information was obtained from EMS.   Level V Caveat.    HPI  Past Medical History Past Medical History:  Diagnosis Date  . Coronary artery disease   . Hypertension    Patient Active Problem List   Diagnosis Date Noted  . STEMI (ST elevation myocardial infarction) (HCC) 04/11/2017  . COPD (chronic obstructive pulmonary disease) (HCC) 04/11/2017  . CHF (congestive heart failure), NYHA class II, acute, combined (HCC) 04/11/2017   Home Medication(s) Prior to Admission medications   Not on File                                                                                                                                    Past Surgical History History reviewed. No pertinent surgical history. Family History History reviewed. No pertinent family history.  Social History Social History   Tobacco Use  . Smoking status: Current Every Day Smoker  . Smokeless tobacco: Never Used  Substance Use Topics  . Alcohol use: Not on file  . Drug use: Not on file    Allergies Patient has no known allergies.  Review of Systems Review of Systems  Unable to perform ROS: Acuity of condition    Physical Exam Vital Signs  I have reviewed the triage vital signs BP 104/64 (BP Location: Left Arm)   Pulse (!) 119   Temp (!) 95.2 F (35.1 C) (Temporal)   Resp 14   Wt 72.6 kg (160 lb)   SpO2 100%   Physical Exam  Constitutional: He is oriented to person, place, and time. He appears well-developed and well-nourished. He appears lethargic. He appears ill. No distress. Face mask in place.  HENT:  Head: Normocephalic and atraumatic.  Nose: Nose normal.  Eyes: Conjunctivae and EOM are normal. Pupils are equal, round, and reactive to light. Right eye exhibits no discharge. Left eye exhibits no discharge. No scleral icterus.  Neck: Normal range of motion. Neck supple.  Cardiovascular: Regular rhythm. Tachycardia present. Exam reveals no gallop and no friction  rub.  No murmur heard. Pulmonary/Chest: No stridor. Tachypnea noted. He is in respiratory distress. He has rales ( Diffuse fine crackles).  Abdominal: Soft. He exhibits no distension. There is no tenderness.  Musculoskeletal: He exhibits no edema or tenderness.  Neurological: He is oriented to person, place, and time. He appears lethargic.  Skin: Skin is warm. No rash noted. He is diaphoretic. No erythema. There is pallor.  Psychiatric: He has a normal mood and affect.  Vitals reviewed.   ED Results and Treatments Labs (all labs ordered are listed, but only abnormal results are displayed) Labs Reviewed  CBC WITH DIFFERENTIAL/PLATELET - Abnormal; Notable for the following components:      Result Value   WBC 13.9 (*)    Lymphs Abs 5.5 (*)    All other components within normal limits  PROTIME-INR - Abnormal; Notable for the following components:   Prothrombin Time 15.9 (*)    All other components within normal limits  COMPREHENSIVE METABOLIC PANEL - Abnormal; Notable for the following  components:   CO2 19 (*)    Glucose, Bld 249 (*)    Creatinine, Ser 1.67 (*)    Calcium 8.4 (*)    Albumin 3.4 (*)    GFR calc non Af Amer 40 (*)    GFR calc Af Amer 47 (*)    All other components within normal limits  TROPONIN I - Abnormal; Notable for the following components:   Troponin I 0.09 (*)    All other components within normal limits  LIPID PANEL - Abnormal; Notable for the following components:   HDL 27 (*)    LDL Cholesterol 118 (*)    All other components within normal limits  APTT                                                                                                                         EKG  EKG Interpretation  Date/Time:    Ventricular Rate:    PR Interval:    QRS Duration:   QT Interval:    QTC Calculation:   R Axis:     Text Interpretation:        Radiology Dg Chest Portable 1 View  Result Date: 04/11/2017 CLINICAL DATA:  Endotracheal tube placement. EXAM: PORTABLE CHEST 1 VIEW COMPARISON:  Chest radiograph performed 05/11/2010 FINDINGS: The patient's endotracheal tube is seen ending 3-4 cm above the carina. An enteric tube is noted extending below the diaphragm. Diffuse bilateral airspace opacification is compatible with pneumonia, though superimposed pulmonary edema cannot be excluded. Vascular congestion is noted. No pleural effusion or pneumothorax is seen. The cardiomediastinal silhouette is mildly enlarged. No acute osseous abnormalities are seen. IMPRESSION: 1. Endotracheal tube seen ending 3-4 cm above the carina. 2. Diffuse bilateral airspace opacification, compatible with pneumonia. Superimposed pulmonary edema cannot be excluded. 3. Vascular congestion and mild cardiomegaly. Electronically Signed   By: Roanna Raider M.D.   On: 04/11/2017 22:25   Pertinent labs & imaging results  that were available during my care of the patient were reviewed by me and considered in my medical decision making (see chart for details).  Medications Ordered  in ED Medications  fentaNYL in NS (94mcg/ml) infusion-PREMIX (50 mcg/hr Intravenous New Bag/Given 04/11/17 2213)  fentaNYL (SUBLIMAZE) bolus via infusion 25 mcg ( Intravenous MAR Hold 04/11/17 2229)  midazolam (VERSED) injection 1 mg ( Intravenous MAR Hold 04/11/17 2229)  midazolam (VERSED) injection 1 mg ( Intravenous MAR Hold 04/11/17 2229)  lidocaine (PF) (XYLOCAINE) 1 % injection (20 mLs  Given 04/11/17 2231)  0.9 %  sodium chloride infusion (10 mL/hr Intravenous New Bag/Given 04/11/17 2234)  bivalirudin (ANGIOMAX) BOLUS via infusion (54.45 mg Intravenous Given 04/11/17 2245)  bivalirudin (ANGIOMAX) 250 mg in sodium chloride 0.9 % 50 mL (5 mg/mL) infusion (1.75 mg/kg/hr  72.6 kg Intravenous New Bag/Given 04/11/17 2248)  heparin injection 4,000 Units (4,000 Units Intravenous Given 04/11/17 2145)  nitroGLYCERIN 50 mg in dextrose 5 % 250 mL (0.2 mg/mL) infusion (0 mcg/min Intravenous Stopped 04/11/17 2210)  etomidate (AMIDATE) injection 20 mg (20 mg Intravenous Given 04/11/17 2204)  rocuronium (ZEMURON) injection 75 mg (75 mg Intravenous Given 04/11/17 2205)                                                                                                                                    Procedures Procedure Name: Intubation Date/Time: 04/11/2017 10:40 PM Performed by: Nira Conn, MD Pre-anesthesia Checklist: Patient identified Oxygen Delivery Method: Non-rebreather mask Preoxygenation: Pre-oxygenation with 100% oxygen Induction Type: Rapid sequence Laryngoscope Size: Glidescope and 4 Grade View: Grade II Tube size: 7.5 mm Number of attempts: 1 Placement Confirmation: CO2 detector,  ETT inserted through vocal cords under direct vision and Breath sounds checked- equal and bilateral Secured at: 24 cm Tube secured with: ETT holder Difficulty Due To: Difficulty was anticipated       (including critical care time)  CRITICAL CARE Performed by: Amadeo Garnet Cardama Total critical care time: 30 minutes Critical care time was exclusive of separately billable procedures and treating other patients. Critical care was necessary to treat or prevent imminent or life-threatening deterioration. Critical care was time spent personally by me on the following activities: development of treatment plan with patient and/or surrogate as well as nursing, discussions with consultants, evaluation of patient's response to treatment, examination of patient, obtaining history from patient or surrogate, ordering and performing treatments and interventions, ordering and review of laboratory studies, ordering and review of radiographic studies, pulse oximetry and re-evaluation of patient's condition.    Medical Decision Making / ED Course I have reviewed the nursing notes for this encounter and the patient's prior records (if available in EHR or on provided paperwork).    Code STEMI activated in route.  Upon arrival patient was noted to be ill-appearing, lethargic, hypertensive, tachycardic, and respiratory distress.  EKG reaffirmed prior ST segment elevations.  Cardiology at bedside who  agreed to take the patient to the Cath Lab.  Patient started on heparin.  Given nitroglycerin resulting in improved blood pressure.  Patient placed on BiPAP due to Rales that were concerning for pulmonary edema likely secondary from heart failure.  Patient still remained lethargic and had difficulty lying supine.  Patient also had very labile saturations.  He was ultimately intubated due to airway protection.  Patient was taken up to the Cath Lab by cardiology for further management.  Final Clinical Impression(s) / ED Diagnoses Final diagnoses:  Hypertensive emergency  ST elevation myocardial infarction (STEMI), unspecified artery (HCC)  Acute heart failure, unspecified heart failure type [facility](HCC)      This chart was dictated using voice recognition software.  Despite best efforts  to proofread,  errors can occur which can change the documentation meaning.   Nira Connardama, Pedro Eduardo, MD 04/11/17 [phone]

## 2017-04-11 NOTE — H&P (Addendum)
[name] is an 66 y.o. male.         No prior  Established  Cardiologist, Pt  Seen emergently in ED  Along  With ED physician  Chief Complaint:CHEST PAIN HPI: 66 YR OLD  WITH  H/O  OF  Remote MI, HTN , COPD, h/o  Smoking C/O chest pain  And  Called EMS Found to have  Inferior , lateral Stemi, brought by EMS    Pt  Not  Oriented to name   And  Place , appears to respond  To name  oof  And on  , and  Does  Seem to have  Chest  Pressure  Per  Ems  PERSONNEL HISTORY ,  Past Medical History:  Diagnosis Date  . Coronary artery disease   . Hypertension     History reviewed. No pertinent surgical history.  History reviewed. No pertinent family history. Social History:  reports that he has been smoking.  he has never used smokeless tobacco. His alcohol and drug histories are not on file.  Allergies: No Known Allergies  No medications prior to admission.  ROS" with available  History  , only  Chest pressure  And dyspnea.  Results for orders placed or performed during the hospital encounter of 04/11/17 (from the past 48 hour(s))  CBC with Differential/Platelet     Status: Abnormal   Collection Time: 04/11/17  9:38 PM  Result Value Ref Range   WBC 13.9 (H) 4.0 - 10.5 K/uL   RBC 5.02 4.22 - 5.81 MIL/uL   Hemoglobin 14.5 13.0 - 17.0 g/dL   HCT 46.7 39.0 - 52.0 %   MCV 93.0 78.0 - 100.0 fL   MCH 28.9 26.0 - 34.0 pg   MCHC 31.0 30.0 - 36.0 g/dL   RDW 14.3 11.5 - 15.5 %   Platelets 235 150 - 400 K/uL   Neutrophils Relative % 51 %   Neutro Abs 7.2 1.7 - 7.7 K/uL   Lymphocytes Relative 40 %   Lymphs Abs 5.5 (H) 0.7 - 4.0 K/uL   Monocytes Relative 6 %   Monocytes Absolute 0.8 0.1 - 1.0 K/uL   Eosinophils Relative 2 %   Eosinophils Absolute 0.3 0.0 - 0.7 K/uL   Basophils Relative 1 %   Basophils Absolute 0.1 0.0 - 0.1 K/uL  Protime-INR     Status: Abnormal   Collection Time: 04/11/17  9:38 PM  Result Value Ref Range   Prothrombin Time 15.9 (H) 11.4 - 15.2 seconds   INR 1.28    APTT     Status: None   Collection Time: 04/11/17  9:38 PM  Result Value Ref Range   aPTT 30 24 - 36 seconds  Comprehensive metabolic panel     Status: Abnormal   Collection Time: 04/11/17  9:38 PM  Result Value Ref Range   Sodium 138 135 - 145 mmol/L   Potassium 4.0 3.5 - 5.1 mmol/L   Chloride 107 101 - 111 mmol/L   CO2 19 (L) 22 - 32 mmol/L   Glucose, Bld 249 (H) 65 - 99 mg/dL   BUN 19 6 - 20 mg/dL   Creatinine, Ser 1.67 (H) 0.61 - 1.24 mg/dL   Calcium 8.4 (L) 8.9 - 10.3 mg/dL   Total Protein 6.9 6.5 - 8.1 g/dL   Albumin 3.4 (L) 3.5 - 5.0 g/dL   AST 32 15 - 41 U/L   ALT 20 17 - 63 U/L   Alkaline Phosphatase 86 38 - 126 U/L  Total Bilirubin 0.5 0.3 - 1.2 mg/dL   GFR calc non Af Amer 40 (L) >60 mL/min   GFR calc Af Amer 47 (L) >60 mL/min    Comment: (NOTE) The eGFR has been calculated using the CKD EPI equation. This calculation has not been validated in all clinical situations. eGFR's persistently <60 mL/min signify possible Chronic Kidney Disease.    Anion gap 12 5 - 15  Troponin I     Status: Abnormal   Collection Time: 04/11/17  9:38 PM  Result Value Ref Range   Troponin I 0.09 (HH) <0.03 ng/mL    Comment: CRITICAL RESULT CALLED TO, READ BACK BY AND VERIFIED WITH: BOPE C,RCIS 04/11/17 2235 WAYK   Lipid panel     Status: Abnormal   Collection Time: 04/11/17  9:38 PM  Result Value Ref Range   Cholesterol 167 0 - 200 mg/dL   Triglycerides 109 <150 mg/dL   HDL 27 (L) >40 mg/dL   Total CHOL/HDL Ratio 6.2 RATIO   VLDL 22 0 - 40 mg/dL   LDL Cholesterol 118 (H) 0 - 99 mg/dL    Comment:        Total Cholesterol/HDL:CHD Risk Coronary Heart Disease Risk Table                     Men   Women  1/2 Average Risk   3.4   3.3  Average Risk       5.0   4.4  2 X Average Risk   9.6   7.1  3 X Average Risk  23.4   11.0        Use the calculated Patient Ratio above and the CHD Risk Table to determine the patient's CHD Risk.        ATP III CLASSIFICATION (LDL):  <100      mg/dL   Optimal  100-129  mg/dL   Near or Above                    Optimal  130-159  mg/dL   Borderline  160-189  mg/dL   High  >190     mg/dL   Very High    Dg Chest Portable 1 View  Result Date: 04/11/2017 CLINICAL DATA:  Endotracheal tube placement. EXAM: PORTABLE CHEST 1 VIEW COMPARISON:  Chest radiograph performed 05/11/2010 FINDINGS: The patient's endotracheal tube is seen ending 3-4 cm above the carina. An enteric tube is noted extending below the diaphragm. Diffuse bilateral airspace opacification is compatible with pneumonia, though superimposed pulmonary edema cannot be excluded. Vascular congestion is noted. No pleural effusion or pneumothorax is seen. The cardiomediastinal silhouette is mildly enlarged. No acute osseous abnormalities are seen. IMPRESSION: 1. Endotracheal tube seen ending 3-4 cm above the carina. 2. Diffuse bilateral airspace opacification, compatible with pneumonia. Superimposed pulmonary edema cannot be excluded. 3. Vascular congestion and mild cardiomegaly. Electronically Signed   By: Garald Balding M.D.   On: 04/11/2017 22:25    ROS  Blood pressure 104/64, pulse (!) 119, temperature (!) 95.2 F (35.1 C), temperature source Temporal, resp. rate 14, weight 72.6 kg (160 lb), SpO2 100 %. Physical Exam  Eyes: Pupils are equal, round, and reactive to light.  Cardiovascular:  Murmur heard. Respiratory: He is in respiratory distress. He has wheezes. He has rales.  Musculoskeletal: Normal range of motion.  Skin: He is diaphoretic.      Assessment/Plan  STEMI- Cath Lab  Infero lateral  Respiratory  Failure CHF, COPD  Exacerbation Hep bolus Cath lab , D/W DR Irish Lack , advised  Intubation , per  ER  Assessment, respiratory  Failure,  ICU admit, consult  Johna Sheriff, MD 04/11/2017, 10:43 PM       I have examined the patient and reviewed assessment and plan and discussed with patient.  Agree with above as stated.  I personally reviewed the ECG and  made the decision to take the patient to the Cath Lab.  Patient appeared gray, and was tachycardic.  He has bilateral crackles.  Due to significant respiratory distress, we elected to intubate the patient prior to the catheterization.  Chest x-ray, which I personally reviewed, showed bilateral pulmonary edema which explained his hypoxia.  The initial ECG appeared to show inferior and lateral ST elevations predominantly.  Cardiac cath revealed an occluded mid LAD.  There was also an ulcerative lesion in the distal RCA.  Given the ECG changes, it was not clear which vessel was the culprit but since the LAD was completely occluded, intervention was attempted on this vessel first.  After thrombectomy and angioplasty, there is significant no reflow.  Intracoronary adenosine was given but the no reflow persisted.  We then treated the distal RCA successfully with a stent.  Repeat angiography of the LAD showed continued no reflow.  Intra-aortic balloon pump was placed due to his elevated LVEDP of 44 mmHg.  Will need to obtain echocardiogram.  Hopefully, the balloon pump can be weaned and the patient diuresed.  We will have to watch his renal function as well.  Creatinine was 1.6 at the start of the cath procedure.  Larae Grooms

## 2017-04-11 NOTE — ED Triage Notes (Addendum)
Patient with sudden onset of shortness of breath and chest pain.  Patient was unwilling to give any information or history to EMS.  Patient is pale upon arrival, in resp distress, talking in one word sentences.  Patient given 324mg  ASA and one sl nitro.  Patient did receive one 5mg  albuterol neb.

## 2017-04-11 NOTE — ED Notes (Signed)
To Cath lab with RN, RT, EMT and MD.

## 2017-04-11 NOTE — ED Notes (Signed)
Pads and zoll on patient

## 2017-04-12 ENCOUNTER — Inpatient Hospital Stay (HOSPITAL_COMMUNITY): Payer: Medicare Other

## 2017-04-12 ENCOUNTER — Encounter (HOSPITAL_COMMUNITY)
Admission: EM | Disposition: A | Discharge status: Rehabilitation Facility | Payer: Self-pay | Source: Home / Self Care | Attending: Cardiothoracic Surgery

## 2017-04-12 ENCOUNTER — Inpatient Hospital Stay (HOSPITAL_COMMUNITY): Payer: Medicare Other | Admitting: Certified Registered Nurse Anesthetist

## 2017-04-12 ENCOUNTER — Ambulatory Visit (HOSPITAL_COMMUNITY): Payer: Medicare Other

## 2017-04-12 ENCOUNTER — Encounter (HOSPITAL_COMMUNITY): Payer: Self-pay | Admitting: Interventional Cardiology

## 2017-04-12 DIAGNOSIS — Z1211 Encounter for screening for malignant neoplasm of colon: Secondary | ICD-10-CM

## 2017-04-12 DIAGNOSIS — I110 Hypertensive heart disease with heart failure: Secondary | ICD-10-CM | POA: Diagnosis not present

## 2017-04-12 DIAGNOSIS — I2109 ST elevation (STEMI) myocardial infarction involving other coronary artery of anterior wall: Secondary | ICD-10-CM

## 2017-04-12 DIAGNOSIS — I2101 ST elevation (STEMI) myocardial infarction involving left main coronary artery: Secondary | ICD-10-CM

## 2017-04-12 DIAGNOSIS — I233 Rupture of cardiac wall without hemopericardium as current complication following acute myocardial infarction: Secondary | ICD-10-CM

## 2017-04-12 DIAGNOSIS — I2111 ST elevation (STEMI) myocardial infarction involving right coronary artery: Secondary | ICD-10-CM

## 2017-04-12 DIAGNOSIS — I5041 Acute combined systolic (congestive) and diastolic (congestive) heart failure: Secondary | ICD-10-CM

## 2017-04-12 DIAGNOSIS — I081 Rheumatic disorders of both mitral and tricuspid valves: Secondary | ICD-10-CM | POA: Diagnosis not present

## 2017-04-12 DIAGNOSIS — I509 Heart failure, unspecified: Secondary | ICD-10-CM | POA: Diagnosis not present

## 2017-04-12 DIAGNOSIS — J810 Acute pulmonary edema: Secondary | ICD-10-CM

## 2017-04-12 DIAGNOSIS — J438 Other emphysema: Secondary | ICD-10-CM

## 2017-04-12 DIAGNOSIS — I313 Pericardial effusion (noninflammatory): Secondary | ICD-10-CM

## 2017-04-12 DIAGNOSIS — I5021 Acute systolic (congestive) heart failure: Secondary | ICD-10-CM

## 2017-04-12 DIAGNOSIS — Z452 Encounter for adjustment and management of vascular access device: Secondary | ICD-10-CM

## 2017-04-12 DIAGNOSIS — I253 Aneurysm of heart: Secondary | ICD-10-CM

## 2017-04-12 DIAGNOSIS — I249 Acute ischemic heart disease, unspecified: Secondary | ICD-10-CM

## 2017-04-12 DIAGNOSIS — R9431 Abnormal electrocardiogram [ECG] [EKG]: Secondary | ICD-10-CM

## 2017-04-12 DIAGNOSIS — J9601 Acute respiratory failure with hypoxia: Secondary | ICD-10-CM

## 2017-04-12 DIAGNOSIS — Z4509 Encounter for adjustment and management of other cardiac device: Secondary | ICD-10-CM

## 2017-04-12 HISTORY — PX: PLACEMENT OF IMPELLA LEFT VENTRICULAR ASSIST DEVICE: SHX6519

## 2017-04-12 HISTORY — PX: REPAIR OF RIGHT VENTRICLE LACERATION: SHX6554

## 2017-04-12 HISTORY — DX: Aneurysm of heart: I25.3

## 2017-04-12 LAB — POCT ACTIVATED CLOTTING TIME: ACTIVATED CLOTTING TIME: 312 s

## 2017-04-12 LAB — ECHOCARDIOGRAM COMPLETE: Weight: 3206.37 oz

## 2017-04-12 LAB — POCT I-STAT, CHEM 8
BUN: 25 mg/dL — ABNORMAL HIGH (ref 6–20)
BUN: 15 mg/dL (ref 6–20)
BUN: 14 mg/dL (ref 6–20)
BUN: 13 mg/dL (ref 6–20)
BUN: 15 mg/dL (ref 6–20)
BUN: 14 mg/dL (ref 6–20)
CALCIUM ION: 1.19 mmol/L (ref 1.15–1.40)
CALCIUM ION: 1.14 mmol/L — AB (ref 1.15–1.40)
CHLORIDE: 101 mmol/L (ref 101–111)
CHLORIDE: 101 mmol/L (ref 101–111)
CHLORIDE: 104 mmol/L (ref 101–111)
CREATININE: 0.8 mg/dL (ref 0.61–1.24)
CREATININE: 0.9 mg/dL (ref 0.61–1.24)
Calcium, Ion: 1.01 mmol/L — ABNORMAL LOW (ref 1.15–1.40)
Calcium, Ion: 0.99 mmol/L — ABNORMAL LOW (ref 1.15–1.40)
Calcium, Ion: 1.08 mmol/L — ABNORMAL LOW (ref 1.15–1.40)
Calcium, Ion: 1.18 mmol/L (ref 1.15–1.40)
Chloride: 104 mmol/L (ref 101–111)
Chloride: 106 mmol/L (ref 101–111)
Chloride: 102 mmol/L (ref 101–111)
Creatinine, Ser: 1.4 mg/dL — ABNORMAL HIGH (ref 0.61–1.24)
Creatinine, Ser: 1 mg/dL (ref 0.61–1.24)
Creatinine, Ser: 0.8 mg/dL (ref 0.61–1.24)
Creatinine, Ser: 0.8 mg/dL (ref 0.61–1.24)
GLUCOSE: 122 mg/dL — AB (ref 65–99)
Glucose, Bld: 283 mg/dL — ABNORMAL HIGH (ref 65–99)
Glucose, Bld: 99 mg/dL (ref 65–99)
Glucose, Bld: 97 mg/dL (ref 65–99)
Glucose, Bld: 111 mg/dL — ABNORMAL HIGH (ref 65–99)
Glucose, Bld: 127 mg/dL — ABNORMAL HIGH (ref 65–99)
HCT: 26 % — ABNORMAL LOW (ref 39.0–52.0)
HEMATOCRIT: 45 % (ref 39.0–52.0)
HEMATOCRIT: 31 % — AB (ref 39.0–52.0)
HEMATOCRIT: 27 % — AB (ref 39.0–52.0)
HEMATOCRIT: 26 % — AB (ref 39.0–52.0)
HEMATOCRIT: 27 % — AB (ref 39.0–52.0)
HEMOGLOBIN: 15.3 g/dL (ref 13.0–17.0)
HEMOGLOBIN: 9.2 g/dL — AB (ref 13.0–17.0)
HEMOGLOBIN: 9.2 g/dL — AB (ref 13.0–17.0)
Hemoglobin: 10.5 g/dL — ABNORMAL LOW (ref 13.0–17.0)
Hemoglobin: 8.8 g/dL — ABNORMAL LOW (ref 13.0–17.0)
Hemoglobin: 8.8 g/dL — ABNORMAL LOW (ref 13.0–17.0)
POTASSIUM: 3.8 mmol/L (ref 3.5–5.1)
POTASSIUM: 4.2 mmol/L (ref 3.5–5.1)
POTASSIUM: 3.6 mmol/L (ref 3.5–5.1)
Potassium: 4.3 mmol/L (ref 3.5–5.1)
Potassium: 3.2 mmol/L — ABNORMAL LOW (ref 3.5–5.1)
Potassium: 3 mmol/L — ABNORMAL LOW (ref 3.5–5.1)
SODIUM: 142 mmol/L (ref 135–145)
SODIUM: 142 mmol/L (ref 135–145)
SODIUM: 142 mmol/L (ref 135–145)
SODIUM: 141 mmol/L (ref 135–145)
Sodium: 142 mmol/L (ref 135–145)
Sodium: 142 mmol/L (ref 135–145)
TCO2: 27 mmol/L (ref 22–32)
TCO2: 23 mmol/L (ref 22–32)
TCO2: 27 mmol/L (ref 22–32)
TCO2: 25 mmol/L (ref 22–32)
TCO2: 26 mmol/L (ref 22–32)
TCO2: 26 mmol/L (ref 22–32)

## 2017-04-12 LAB — POCT I-STAT 3, ART BLOOD GAS (G3+)
Acid-Base Excess: 2 mmol/L (ref 0.0–2.0)
Acid-Base Excess: 3 mmol/L — ABNORMAL HIGH (ref 0.0–2.0)
Acid-Base Excess: 2 mmol/L (ref 0.0–2.0)
Acid-base deficit: 1 mmol/L (ref 0.0–2.0)
Acid-base deficit: 2 mmol/L (ref 0.0–2.0)
BICARBONATE: 26.8 mmol/L (ref 20.0–28.0)
BICARBONATE: 25.6 mmol/L (ref 20.0–28.0)
BICARBONATE: 24.6 mmol/L (ref 20.0–28.0)
Bicarbonate: 27.8 mmol/L (ref 20.0–28.0)
Bicarbonate: 26.8 mmol/L (ref 20.0–28.0)
Bicarbonate: 23.9 mmol/L (ref 20.0–28.0)
Bicarbonate: 27.7 mmol/L (ref 20.0–28.0)
O2 SAT: 98 %
O2 SAT: 92 %
O2 Saturation: 100 %
O2 Saturation: 95 %
O2 Saturation: 100 %
O2 Saturation: 100 %
O2 Saturation: 95 %
PCO2 ART: 66.4 mmHg — AB (ref 32.0–48.0)
PCO2 ART: 42.9 mmHg (ref 32.0–48.0)
PCO2 ART: 36.9 mmHg (ref 32.0–48.0)
PH ART: 7.229 — AB (ref 7.350–7.450)
PH ART: 7.402 (ref 7.350–7.450)
PH ART: 7.42 (ref 7.350–7.450)
PH ART: 7.427 (ref 7.350–7.450)
PH ART: 7.412 (ref 7.350–7.450)
PH ART: 7.311 — AB (ref 7.350–7.450)
PO2 ART: 106 mmHg (ref 83.0–108.0)
PO2 ART: 306 mmHg — AB (ref 83.0–108.0)
PO2 ART: 67 mmHg — AB (ref 83.0–108.0)
PO2 ART: 82 mmHg — AB (ref 83.0–108.0)
Patient temperature: 36.7
TCO2: 30 mmol/L (ref 22–32)
TCO2: 28 mmol/L (ref 22–32)
TCO2: 25 mmol/L (ref 22–32)
TCO2: 29 mmol/L (ref 22–32)
TCO2: 28 mmol/L (ref 22–32)
TCO2: 27 mmol/L (ref 22–32)
TCO2: 26 mmol/L (ref 22–32)
pCO2 arterial: 42 mmHg (ref 32.0–48.0)
pCO2 arterial: 42.1 mmHg (ref 32.0–48.0)
pCO2 arterial: 45.9 mmHg (ref 32.0–48.0)
pCO2 arterial: 48.5 mmHg — ABNORMAL HIGH (ref 32.0–48.0)
pH, Arterial: 7.355 (ref 7.350–7.450)
pO2, Arterial: 222 mmHg — ABNORMAL HIGH (ref 83.0–108.0)
pO2, Arterial: 76 mmHg — ABNORMAL LOW (ref 83.0–108.0)
pO2, Arterial: 327 mmHg — ABNORMAL HIGH (ref 83.0–108.0)

## 2017-04-12 LAB — CBC
HCT: 42.5 % (ref 39.0–52.0)
HCT: 38.5 % — ABNORMAL LOW (ref 39.0–52.0)
HCT: 40.2 % (ref 39.0–52.0)
HEMOGLOBIN: 13 g/dL (ref 13.0–17.0)
HEMOGLOBIN: 13.5 g/dL (ref 13.0–17.0)
Hemoglobin: 12.1 g/dL — ABNORMAL LOW (ref 13.0–17.0)
MCH: 29.3 pg (ref 26.0–34.0)
MCH: 28.2 pg (ref 26.0–34.0)
MCH: 29 pg (ref 26.0–34.0)
MCHC: 31.4 g/dL (ref 30.0–36.0)
MCHC: 31.8 g/dL (ref 30.0–36.0)
MCHC: 32.3 g/dL (ref 30.0–36.0)
MCV: 89.5 fL (ref 78.0–100.0)
MCV: 89.7 fL (ref 78.0–100.0)
MCV: 92.2 fL (ref 78.0–100.0)
PLATELETS: 202 10*3/uL (ref 150–400)
Platelets: 194 10*3/uL (ref 150–400)
Platelets: 79 10*3/uL — ABNORMAL LOW (ref 150–400)
RBC: 4.29 MIL/uL (ref 4.22–5.81)
RBC: 4.49 MIL/uL (ref 4.22–5.81)
RBC: 4.61 MIL/uL (ref 4.22–5.81)
RDW: 14.5 % (ref 11.5–15.5)
RDW: 14.2 % (ref 11.5–15.5)
RDW: 14.2 % (ref 11.5–15.5)
WBC: 12.1 10*3/uL — AB (ref 4.0–10.5)
WBC: 12.6 10*3/uL — AB (ref 4.0–10.5)
WBC: 19.6 10*3/uL — ABNORMAL HIGH (ref 4.0–10.5)

## 2017-04-12 LAB — COMPREHENSIVE METABOLIC PANEL
ALBUMIN: 3 g/dL — AB (ref 3.5–5.0)
ALK PHOS: 75 U/L (ref 38–126)
ALT: 27 U/L (ref 17–63)
ANION GAP: 7 (ref 5–15)
AST: 39 U/L (ref 15–41)
BUN: 20 mg/dL (ref 6–20)
CALCIUM: 8.1 mg/dL — AB (ref 8.9–10.3)
CHLORIDE: 109 mmol/L (ref 101–111)
CO2: 24 mmol/L (ref 22–32)
Creatinine, Ser: 1.52 mg/dL — ABNORMAL HIGH (ref 0.61–1.24)
GFR calc Af Amer: 53 mL/min — ABNORMAL LOW (ref >60)
GFR calc non Af Amer: 45 mL/min — ABNORMAL LOW (ref >60)
GLUCOSE: 150 mg/dL — AB (ref 65–99)
Potassium: 4.3 mmol/L (ref 3.5–5.1)
SODIUM: 140 mmol/L (ref 135–145)
Total Bilirubin: 0.7 mg/dL (ref 0.3–1.2)
Total Protein: 6.3 g/dL — ABNORMAL LOW (ref 6.5–8.1)

## 2017-04-12 LAB — POCT I-STAT 4, (NA,K, GLUC, HGB,HCT)
Glucose, Bld: 153 mg/dL — ABNORMAL HIGH (ref 65–99)
HCT: 38 % — ABNORMAL LOW (ref 39.0–52.0)
Hemoglobin: 12.9 g/dL — ABNORMAL LOW (ref 13.0–17.0)
Potassium: 3.6 mmol/L (ref 3.5–5.1)
Sodium: 143 mmol/L (ref 135–145)

## 2017-04-12 LAB — COOXEMETRY PANEL
Carboxyhemoglobin: 1 % (ref 0.5–1.5)
METHEMOGLOBIN: 1.1 % (ref 0.0–1.5)
O2 Saturation: 70.5 %
Total hemoglobin: 12.3 g/dL (ref 12.0–16.0)

## 2017-04-12 LAB — FIBRINOGEN
FIBRINOGEN: 249 mg/dL (ref 210–475)
Fibrinogen: 297 mg/dL (ref 210–475)

## 2017-04-12 LAB — BASIC METABOLIC PANEL
ANION GAP: 9 (ref 5–15)
BUN: 19 mg/dL (ref 6–20)
CALCIUM: 7.8 mg/dL — AB (ref 8.9–10.3)
CO2: 24 mmol/L (ref 22–32)
Chloride: 107 mmol/L (ref 101–111)
Creatinine, Ser: 1.42 mg/dL — ABNORMAL HIGH (ref 0.61–1.24)
GFR, EST AFRICAN AMERICAN: 57 mL/min — AB (ref >60)
GFR, EST NON AFRICAN AMERICAN: 49 mL/min — AB (ref >60)
Glucose, Bld: 119 mg/dL — ABNORMAL HIGH (ref 65–99)
Potassium: 4.2 mmol/L (ref 3.5–5.1)
Sodium: 140 mmol/L (ref 135–145)

## 2017-04-12 LAB — PREPARE RBC (CROSSMATCH)

## 2017-04-12 LAB — MRSA PCR SCREENING: MRSA BY PCR: NEGATIVE

## 2017-04-12 LAB — LACTATE DEHYDROGENASE: LDH: 334 U/L — AB (ref 98–192)

## 2017-04-12 LAB — ABO/RH: ABO/RH(D): O POS

## 2017-04-12 LAB — GLUCOSE, CAPILLARY
GLUCOSE-CAPILLARY: 140 mg/dL — AB (ref 65–99)
GLUCOSE-CAPILLARY: 87 mg/dL (ref 65–99)
Glucose-Capillary: 96 mg/dL (ref 65–99)

## 2017-04-12 LAB — TROPONIN I
TROPONIN I: 0.29 ng/mL — AB (ref <0.03)
Troponin I: 0.11 ng/mL (ref <0.03)
Troponin I: 0.18 ng/mL (ref <0.03)

## 2017-04-12 LAB — LACTIC ACID, PLASMA: LACTIC ACID, VENOUS: 0.9 mmol/L (ref 0.5–1.9)

## 2017-04-12 LAB — PLATELET COUNT: Platelets: 116 10*3/uL — ABNORMAL LOW (ref 150–400)

## 2017-04-12 LAB — PROTIME-INR
INR: 0.99
PROTHROMBIN TIME: 13 s (ref 11.4–15.2)

## 2017-04-12 LAB — HEMOGLOBIN AND HEMATOCRIT, BLOOD
HCT: 27.4 % — ABNORMAL LOW (ref 39.0–52.0)
Hemoglobin: 8.6 g/dL — ABNORMAL LOW (ref 13.0–17.0)

## 2017-04-12 LAB — PHOSPHORUS: Phosphorus: 2.8 mg/dL (ref 2.5–4.6)

## 2017-04-12 LAB — APTT: APTT: 41 s — AB (ref 24–36)

## 2017-04-12 LAB — MAGNESIUM: MAGNESIUM: 1.6 mg/dL — AB (ref 1.7–2.4)

## 2017-04-12 LAB — PROCALCITONIN: Procalcitonin: 1.16 ng/mL

## 2017-04-12 SURGERY — REPAIR, PERFORATION, CARDIAC VENTRICLE
Anesthesia: General | Laterality: Left

## 2017-04-12 MED ORDER — ACETAMINOPHEN 325 MG PO TABS: 650.0000 mg | ORAL_TABLET | ORAL | Status: DC | PRN

## 2017-04-12 MED ORDER — AMIODARONE IV BOLUS ONLY 150 MG/100ML
INTRAVENOUS | Status: DC | PRN
  Administered 2017-04-12: 150 mg via INTRAVENOUS

## 2017-04-12 MED ORDER — EPHEDRINE 5 MG/ML INJ
INTRAVENOUS | Status: AC
  Filled 2017-04-12: qty 20

## 2017-04-12 MED ORDER — SODIUM CHLORIDE 0.9 % IV SOLN
INTRAVENOUS | Status: DC
  Administered 2017-04-12: 10 mL/h via INTRAVENOUS

## 2017-04-12 MED ORDER — AMIODARONE HCL IN DEXTROSE 360-4.14 MG/200ML-% IV SOLN
30.0000 mg/h | INTRAVENOUS | Status: DC
  Administered 2017-04-13 – 2017-04-25 (×25): 30 mg/h via INTRAVENOUS
  Filled 2017-04-12 (×26): qty 200

## 2017-04-12 MED ORDER — EPINEPHRINE PF 1 MG/ML IJ SOLN
0.0000 ug/min | INTRAMUSCULAR | Status: AC
  Administered 2017-04-12: 4 ug/min via INTRAVENOUS
  Filled 2017-04-12 (×2): qty 4

## 2017-04-12 MED ORDER — CHLORHEXIDINE GLUCONATE CLOTH 2 % EX PADS: 6.0000 | MEDICATED_PAD | Freq: Once | CUTANEOUS | Status: DC

## 2017-04-12 MED ORDER — BISACODYL 5 MG PO TBEC
10.0000 mg | DELAYED_RELEASE_TABLET | Freq: Every day | ORAL | Status: DC
  Administered 2017-04-19 – 2017-04-27 (×6): 10 mg via ORAL
  Filled 2017-04-12 (×5): qty 2

## 2017-04-12 MED ORDER — SODIUM CHLORIDE 0.9% FLUSH: 3.0000 mL | INTRAVENOUS | Status: DC | PRN

## 2017-04-12 MED ORDER — CHLORHEXIDINE GLUCONATE 0.12 % MT SOLN
15.0000 mL | OROMUCOSAL | Status: AC
  Administered 2017-04-12: 15 mL via OROMUCOSAL

## 2017-04-12 MED ORDER — ASPIRIN EC 81 MG PO TBEC: 81.0000 mg | DELAYED_RELEASE_TABLET | Freq: Every day | ORAL | Status: DC

## 2017-04-12 MED ORDER — LACTATED RINGERS IV SOLN
INTRAVENOUS | Status: DC
  Administered 2017-04-14 – 2017-04-16 (×2): via INTRAVENOUS

## 2017-04-12 MED ORDER — MAGNESIUM SULFATE 50 % IJ SOLN
40.0000 meq | INTRAMUSCULAR | Status: DC
  Filled 2017-04-12: qty 9.85

## 2017-04-12 MED ORDER — ASPIRIN 81 MG PO CHEW
81.0000 mg | CHEWABLE_TABLET | Freq: Every day | ORAL | Status: DC
  Administered 2017-04-12: 81 mg via ORAL
  Filled 2017-04-12: qty 1

## 2017-04-12 MED ORDER — MIDAZOLAM HCL 2 MG/2ML IJ SOLN
INTRAMUSCULAR | Status: AC
  Filled 2017-04-12: qty 2

## 2017-04-12 MED ORDER — FENTANYL CITRATE (PF) 250 MCG/5ML IJ SOLN
INTRAMUSCULAR | Status: AC
  Filled 2017-04-12: qty 20

## 2017-04-12 MED ORDER — DIPHENHYDRAMINE HCL 50 MG/ML IJ SOLN
INTRAMUSCULAR | Status: DC | PRN
  Administered 2017-04-12: 25 mg via INTRAVENOUS

## 2017-04-12 MED ORDER — SODIUM CHLORIDE 0.9 % IV SOLN
INTRAVENOUS | Status: DC
  Filled 2017-04-12: qty 1

## 2017-04-12 MED ORDER — OXYCODONE HCL 5 MG PO TABS: 5.0000 mg | ORAL_TABLET | ORAL | Status: DC | PRN

## 2017-04-12 MED ORDER — TRANEXAMIC ACID 1000 MG/10ML IV SOLN
1.5000 mg/kg/h | INTRAVENOUS | Status: AC
  Administered 2017-04-12: 1.5 mg/kg/h via INTRAVENOUS
  Filled 2017-04-12: qty 25

## 2017-04-12 MED ORDER — BISACODYL 10 MG RE SUPP
10.0000 mg | Freq: Every day | RECTAL | Status: DC
  Administered 2017-04-14 – 2017-04-16 (×3): 10 mg via RECTAL
  Filled 2017-04-12 (×3): qty 1

## 2017-04-12 MED ORDER — ALBUMIN HUMAN 5 % IV SOLN
250.0000 mL | INTRAVENOUS | Status: AC | PRN
  Administered 2017-04-13 (×3): 250 mL via INTRAVENOUS
  Filled 2017-04-12: qty 250

## 2017-04-12 MED ORDER — DEXTROSE 5 % IV SOLN
500.0000 mg | Freq: Every day | INTRAVENOUS | Status: DC
Start: 2017-04-12 — End: 2017-04-12
  Administered 2017-04-12: 500 mg via INTRAVENOUS
  Filled 2017-04-12 (×2): qty 500

## 2017-04-12 MED ORDER — SODIUM CHLORIDE 0.9 % IV SOLN
INTRAVENOUS | Status: DC
  Filled 2017-04-12: qty 30

## 2017-04-12 MED ORDER — SODIUM CHLORIDE 0.45 % IV SOLN
INTRAVENOUS | Status: DC | PRN
  Administered 2017-04-12 – 2017-04-14 (×2): via INTRAVENOUS
  Administered 2017-04-16: 20 mL/h via INTRAVENOUS

## 2017-04-12 MED ORDER — PANTOPRAZOLE SODIUM 40 MG PO TBEC: 40.0000 mg | DELAYED_RELEASE_TABLET | Freq: Every day | ORAL | Status: DC

## 2017-04-12 MED ORDER — VECURONIUM BROMIDE 10 MG IV SOLR
INTRAVENOUS | Status: AC
  Filled 2017-04-12: qty 10

## 2017-04-12 MED ORDER — MIDAZOLAM HCL 2 MG/2ML IJ SOLN
2.0000 mg | INTRAMUSCULAR | Status: DC | PRN
  Administered 2017-04-13 – 2017-04-24 (×83): 2 mg via INTRAVENOUS
  Filled 2017-04-12 (×84): qty 2

## 2017-04-12 MED ORDER — ASPIRIN 81 MG PO CHEW
324.0000 mg | CHEWABLE_TABLET | Freq: Every day | ORAL | Status: DC
  Administered 2017-04-13: 324 mg
  Filled 2017-04-12: qty 4

## 2017-04-12 MED ORDER — VITAL HIGH PROTEIN PO LIQD: 1000.0000 mL | ORAL | Status: DC

## 2017-04-12 MED ORDER — TICAGRELOR 90 MG PO TABS: 90.0000 mg | ORAL_TABLET | Freq: Two times a day (BID) | ORAL | Status: DC

## 2017-04-12 MED ORDER — SUCCINYLCHOLINE CHLORIDE 200 MG/10ML IV SOSY
PREFILLED_SYRINGE | INTRAVENOUS | Status: AC
  Filled 2017-04-12: qty 10

## 2017-04-12 MED ORDER — SODIUM CHLORIDE 0.9 % IV SOLN
0.0000 ug/min | INTRAVENOUS | Status: DC
  Administered 2017-04-14: 15 ug/min via INTRAVENOUS
  Administered 2017-04-14: 20 ug/min via INTRAVENOUS
  Administered 2017-04-15: 25 ug/min via INTRAVENOUS
  Filled 2017-04-12 (×5): qty 2

## 2017-04-12 MED ORDER — IOPAMIDOL (ISOVUE-370) INJECTION 76%
INTRAVENOUS | Status: DC | PRN
  Administered 2017-04-12: 215 mL via INTRAVENOUS

## 2017-04-12 MED ORDER — ORAL CARE MOUTH RINSE
15.0000 mL | OROMUCOSAL | Status: DC
  Administered 2017-04-13 – 2017-04-24 (×114): 15 mL via OROMUCOSAL

## 2017-04-12 MED ORDER — HEPARIN SODIUM (PORCINE) 1000 UNIT/ML IJ SOLN
INTRAMUSCULAR | Status: AC
  Filled 2017-04-12: qty 1

## 2017-04-12 MED ORDER — DEXAMETHASONE SODIUM PHOSPHATE 4 MG/ML IJ SOLN
INTRAMUSCULAR | Status: DC | PRN
  Administered 2017-04-12: 10 mg via INTRAVENOUS

## 2017-04-12 MED ORDER — ASPIRIN EC 325 MG PO TBEC
325.0000 mg | DELAYED_RELEASE_TABLET | Freq: Every day | ORAL | Status: DC
  Filled 2017-04-12: qty 1

## 2017-04-12 MED ORDER — METOPROLOL TARTRATE 12.5 MG HALF TABLET: 12.5000 mg | ORAL_TABLET | Freq: Two times a day (BID) | ORAL | Status: DC

## 2017-04-12 MED ORDER — POTASSIUM CHLORIDE 2 MEQ/ML IV SOLN
80.0000 meq | INTRAVENOUS | Status: DC
  Filled 2017-04-12: qty 40

## 2017-04-12 MED ORDER — CHLORHEXIDINE GLUCONATE 0.12% ORAL RINSE (MEDLINE KIT)
15.0000 mL | Freq: Two times a day (BID) | OROMUCOSAL | Status: DC
  Administered 2017-04-13 – 2017-04-24 (×24): 15 mL via OROMUCOSAL

## 2017-04-12 MED ORDER — POTASSIUM CHLORIDE 10 MEQ/50ML IV SOLN
10.0000 meq | INTRAVENOUS | Status: AC
  Administered 2017-04-12 – 2017-04-13 (×3): 10 meq via INTRAVENOUS

## 2017-04-12 MED ORDER — ACETAMINOPHEN 500 MG PO TABS: 1000.0000 mg | ORAL_TABLET | Freq: Four times a day (QID) | ORAL | Status: AC

## 2017-04-12 MED ORDER — CEFUROXIME SODIUM 750 MG IJ SOLR
750.0000 mg | INTRAMUSCULAR | Status: DC
  Filled 2017-04-12: qty 750

## 2017-04-12 MED ORDER — ASPIRIN 81 MG PO CHEW: 324.0000 mg | CHEWABLE_TABLET | ORAL | Status: DC

## 2017-04-12 MED ORDER — ASPIRIN 300 MG RE SUPP: 300.0000 mg | RECTAL | Status: DC

## 2017-04-12 MED ORDER — SODIUM CHLORIDE 0.9 % IV SOLN
INTRAVENOUS | Status: DC
  Administered 2017-04-12 – 2017-04-25 (×3): via INTRAVENOUS

## 2017-04-12 MED ORDER — 0.9 % SODIUM CHLORIDE (POUR BTL) OPTIME
TOPICAL | Status: DC | PRN
Start: 2017-04-12 — End: 2017-04-12
  Administered 2017-04-12: 5000 mL

## 2017-04-12 MED ORDER — INSULIN REGULAR BOLUS VIA INFUSION
0.0000 [IU] | Freq: Three times a day (TID) | INTRAVENOUS | Status: DC
  Filled 2017-04-12: qty 10

## 2017-04-12 MED ORDER — LACTATED RINGERS IV SOLN: 500.0000 mL | Freq: Once | INTRAVENOUS | Status: DC | PRN

## 2017-04-12 MED ORDER — PANTOPRAZOLE SODIUM 40 MG IV SOLR: 40.0000 mg | Freq: Every day | INTRAVENOUS | Status: DC

## 2017-04-12 MED ORDER — PROPOFOL 10 MG/ML IV BOLUS
INTRAVENOUS | Status: AC
  Filled 2017-04-12: qty 20

## 2017-04-12 MED ORDER — CHLORHEXIDINE GLUCONATE 0.12% ORAL RINSE (MEDLINE KIT)
15.0000 mL | Freq: Two times a day (BID) | OROMUCOSAL | Status: DC
  Administered 2017-04-12: 15 mL via OROMUCOSAL

## 2017-04-12 MED ORDER — DEXMEDETOMIDINE HCL IN NACL 400 MCG/100ML IV SOLN
0.0000 ug/kg/h | INTRAVENOUS | Status: DC
  Administered 2017-04-12 – 2017-04-16 (×17): 0.7 ug/kg/h via INTRAVENOUS
  Administered 2017-04-17 (×2): 0.8 ug/kg/h via INTRAVENOUS
  Administered 2017-04-17: 0.7 ug/kg/h via INTRAVENOUS
  Administered 2017-04-17 – 2017-04-18 (×5): 0.9 ug/kg/h via INTRAVENOUS
  Filled 2017-04-12 (×24): qty 100

## 2017-04-12 MED ORDER — SODIUM BICARBONATE 8.4 % IV SOLN
INTRAVENOUS | Status: DC
  Administered 2017-04-12: 01:00:00 via INTRAVENOUS
  Filled 2017-04-12 (×2): qty 850

## 2017-04-12 MED ORDER — LACTATED RINGERS IV SOLN
INTRAVENOUS | Status: DC | PRN
  Administered 2017-04-12: 15:00:00 via INTRAVENOUS

## 2017-04-12 MED ORDER — LACTATED RINGERS IV SOLN
INTRAVENOUS | Status: DC
  Administered 2017-04-13: 20 mL/h via INTRAVENOUS
  Administered 2017-04-13 – 2017-04-16 (×3): via INTRAVENOUS

## 2017-04-12 MED ORDER — SODIUM CHLORIDE 0.9% FLUSH
3.0000 mL | Freq: Two times a day (BID) | INTRAVENOUS | Status: DC
  Administered 2017-04-13 – 2017-04-25 (×16): 3 mL via INTRAVENOUS

## 2017-04-12 MED ORDER — ARTIFICIAL TEARS OPHTHALMIC OINT
TOPICAL_OINTMENT | OPHTHALMIC | Status: DC | PRN
  Administered 2017-04-12: 1 via OPHTHALMIC

## 2017-04-12 MED ORDER — METOPROLOL TARTRATE 25 MG/10 ML ORAL SUSPENSION: 12.5000 mg | Freq: Two times a day (BID) | ORAL | Status: DC

## 2017-04-12 MED ORDER — SODIUM CHLORIDE 0.9 % IV SOLN: INTRAVENOUS | Status: DC

## 2017-04-12 MED ORDER — TRANEXAMIC ACID (OHS) BOLUS VIA INFUSION
15.0000 mg/kg | INTRAVENOUS | Status: AC
  Administered 2017-04-12: 1363.5 mg via INTRAVENOUS

## 2017-04-12 MED ORDER — DEXTROSE 5 % IV SOLN
0.0000 ug/min | INTRAVENOUS | Status: DC
  Administered 2017-04-12: 2 ug/min via INTRAVENOUS
  Filled 2017-04-12 (×2): qty 4

## 2017-04-12 MED ORDER — HEMOSTATIC AGENTS (NO CHARGE) OPTIME
TOPICAL | Status: DC | PRN
  Administered 2017-04-12 (×7): 1 via TOPICAL

## 2017-04-12 MED ORDER — EPINEPHRINE PF 1 MG/ML IJ SOLN: 0.0000 ug/min | INTRAVENOUS | Status: DC

## 2017-04-12 MED ORDER — AMIODARONE HCL IN DEXTROSE 360-4.14 MG/200ML-% IV SOLN
INTRAVENOUS | Status: DC | PRN
  Administered 2017-04-12: 60 mg/h via INTRAVENOUS

## 2017-04-12 MED ORDER — MIDAZOLAM HCL 10 MG/2ML IJ SOLN
INTRAMUSCULAR | Status: AC
  Filled 2017-04-12: qty 2

## 2017-04-12 MED ORDER — NITROGLYCERIN IN D5W 200-5 MCG/ML-% IV SOLN: 0.0000 ug/min | INTRAVENOUS | Status: DC

## 2017-04-12 MED ORDER — GELATIN ABSORBABLE MT POWD
OROMUCOSAL | Status: DC | PRN
  Administered 2017-04-12: 4 mL via TOPICAL

## 2017-04-12 MED ORDER — ACETAMINOPHEN 160 MG/5ML PO SOLN
1000.0000 mg | Freq: Four times a day (QID) | ORAL | Status: AC
  Administered 2017-04-13 – 2017-04-17 (×16): 1000 mg
  Filled 2017-04-12 (×16): qty 40.6

## 2017-04-12 MED ORDER — DOPAMINE-DEXTROSE 3.2-5 MG/ML-% IV SOLN
0.0000 ug/kg/min | INTRAVENOUS | Status: AC
  Administered 2017-04-12: 2.5 ug/kg/min via INTRAVENOUS
  Filled 2017-04-12: qty 250

## 2017-04-12 MED ORDER — MIDAZOLAM HCL 2 MG/2ML IJ SOLN
INTRAMUSCULAR | Status: DC | PRN
  Administered 2017-04-11: 2 mg via INTRAVENOUS

## 2017-04-12 MED ORDER — FENTANYL CITRATE (PF) 250 MCG/5ML IJ SOLN
INTRAMUSCULAR | Status: DC | PRN
  Administered 2017-04-12 (×2): 100 ug via INTRAVENOUS
  Administered 2017-04-12: 250 ug via INTRAVENOUS
  Administered 2017-04-12: 100 ug via INTRAVENOUS
  Administered 2017-04-12: 150 ug via INTRAVENOUS
  Administered 2017-04-12: 200 ug via INTRAVENOUS
  Administered 2017-04-12: 100 ug via INTRAVENOUS
  Administered 2017-04-12: 150 ug via INTRAVENOUS
  Administered 2017-04-12 (×3): 100 ug via INTRAVENOUS
  Administered 2017-04-12 (×2): 150 ug via INTRAVENOUS

## 2017-04-12 MED ORDER — THROMBIN (RECOMBINANT) 5000 UNITS EX SOLR
CUTANEOUS | Status: DC | PRN
  Administered 2017-04-12: 5000 [IU] via TOPICAL

## 2017-04-12 MED ORDER — MORPHINE SULFATE (PF) 4 MG/ML IV SOLN: 1.0000 mg | INTRAVENOUS | Status: DC | PRN

## 2017-04-12 MED ORDER — SODIUM CHLORIDE 0.9 % IV SOLN: Freq: Once | INTRAVENOUS | Status: DC

## 2017-04-12 MED ORDER — DEXTROSE 5 % IV SOLN
1.5000 g | Freq: Two times a day (BID) | INTRAVENOUS | Status: DC
  Administered 2017-04-13 – 2017-04-15 (×5): 1.5 g via INTRAVENOUS
  Filled 2017-04-12 (×5): qty 1.5

## 2017-04-12 MED ORDER — SODIUM CHLORIDE 0.9 % IV SOLN: 250.0000 mL | INTRAVENOUS | Status: DC | PRN

## 2017-04-12 MED ORDER — ONDANSETRON HCL 4 MG/2ML IJ SOLN: 4.0000 mg | Freq: Four times a day (QID) | INTRAMUSCULAR | Status: DC | PRN

## 2017-04-12 MED ORDER — AMIODARONE HCL IN DEXTROSE 360-4.14 MG/200ML-% IV SOLN
60.0000 mg/h | INTRAVENOUS | Status: AC
  Administered 2017-04-12: 60 mg/h via INTRAVENOUS
  Filled 2017-04-12: qty 200

## 2017-04-12 MED ORDER — EPINEPHRINE PF 1 MG/ML IJ SOLN
0.5000 ug/min | INTRAVENOUS | Status: DC
  Administered 2017-04-14 – 2017-04-16 (×4): 2 ug/min via INTRAVENOUS
  Filled 2017-04-12 (×4): qty 4

## 2017-04-12 MED ORDER — SODIUM CHLORIDE 0.9 % IV BOLUS (SEPSIS)
250.0000 mL | Freq: Once | INTRAVENOUS | Status: AC
  Administered 2017-04-12: 250 mL via INTRAVENOUS

## 2017-04-12 MED ORDER — BIVALIRUDIN TRIFLUOROACETATE 250 MG IV SOLR: 1.7500 mg/kg/h | INTRAVENOUS | Status: DC

## 2017-04-12 MED ORDER — TICAGRELOR 90 MG PO TABS
90.0000 mg | ORAL_TABLET | Freq: Two times a day (BID) | ORAL | Status: DC
  Administered 2017-04-12: 90 mg
  Filled 2017-04-12: qty 1

## 2017-04-12 MED ORDER — INSULIN ASPART 100 UNIT/ML ~~LOC~~ SOLN
2.0000 [IU] | SUBCUTANEOUS | Status: DC
  Administered 2017-04-12: 2 [IU] via SUBCUTANEOUS

## 2017-04-12 MED ORDER — CALCIUM CHLORIDE 10 % IV SOLN
INTRAVENOUS | Status: DC | PRN
  Administered 2017-04-12: 0.5 g via INTRAVENOUS
  Administered 2017-04-12: .25 g via INTRAVENOUS
  Administered 2017-04-12: 0.5 g via INTRAVENOUS
  Administered 2017-04-12 (×3): .25 g via INTRAVENOUS
  Administered 2017-04-12: 0.5 g via INTRAVENOUS
  Administered 2017-04-12 (×2): .25 g via INTRAVENOUS
  Administered 2017-04-12: 0.5 g via INTRAVENOUS

## 2017-04-12 MED ORDER — NITROGLYCERIN 0.4 MG SL SUBL: 0.4000 mg | SUBLINGUAL_TABLET | SUBLINGUAL | Status: DC | PRN

## 2017-04-12 MED ORDER — SODIUM CHLORIDE 0.9 % IV SOLN
30.0000 ug/min | INTRAVENOUS | Status: DC
  Filled 2017-04-12: qty 2

## 2017-04-12 MED ORDER — ROCURONIUM BROMIDE 10 MG/ML (PF) SYRINGE
PREFILLED_SYRINGE | INTRAVENOUS | Status: AC
  Filled 2017-04-12: qty 15

## 2017-04-12 MED ORDER — SODIUM CHLORIDE 0.9% FLUSH
3.0000 mL | Freq: Two times a day (BID) | INTRAVENOUS | Status: DC
  Administered 2017-04-12: 3 mL via INTRAVENOUS

## 2017-04-12 MED ORDER — HEPARIN (PORCINE) IN NACL 100-0.45 UNIT/ML-% IJ SOLN
1250.0000 [IU]/h | INTRAMUSCULAR | Status: DC
  Administered 2017-04-12: 1250 [IU]/h via INTRAVENOUS
  Filled 2017-04-12: qty 250

## 2017-04-12 MED ORDER — FENTANYL CITRATE (PF) 250 MCG/5ML IJ SOLN
INTRAMUSCULAR | Status: AC
  Filled 2017-04-12: qty 5

## 2017-04-12 MED ORDER — NITROGLYCERIN IN D5W 200-5 MCG/ML-% IV SOLN
2.0000 ug/min | INTRAVENOUS | Status: AC
  Administered 2017-04-12: 16.6 ug/min via INTRAVENOUS
  Filled 2017-04-12: qty 250

## 2017-04-12 MED ORDER — LABETALOL HCL 5 MG/ML IV SOLN: 10.0000 mg | INTRAVENOUS | Status: DC | PRN

## 2017-04-12 MED ORDER — FAMOTIDINE IN NACL 20-0.9 MG/50ML-% IV SOLN
20.0000 mg | Freq: Two times a day (BID) | INTRAVENOUS | Status: AC
  Administered 2017-04-12 – 2017-04-13 (×2): 20 mg via INTRAVENOUS
  Filled 2017-04-12: qty 50

## 2017-04-12 MED ORDER — FUROSEMIDE 10 MG/ML IJ SOLN
80.0000 mg | Freq: Once | INTRAMUSCULAR | Status: AC
  Administered 2017-04-12: 40 mg via INTRAVENOUS
  Filled 2017-04-12: qty 8

## 2017-04-12 MED ORDER — TRAMADOL HCL 50 MG PO TABS: 50.0000 mg | ORAL_TABLET | ORAL | Status: DC | PRN

## 2017-04-12 MED ORDER — CHLORHEXIDINE GLUCONATE 0.12 % MT SOLN: 15.0000 mL | Freq: Once | OROMUCOSAL | Status: DC

## 2017-04-12 MED ORDER — BISACODYL 5 MG PO TBEC: 5.0000 mg | DELAYED_RELEASE_TABLET | Freq: Once | ORAL | Status: DC

## 2017-04-12 MED ORDER — ROCURONIUM BROMIDE 10 MG/ML (PF) SYRINGE
PREFILLED_SYRINGE | INTRAVENOUS | Status: DC | PRN
  Administered 2017-04-12: 50 mg via INTRAVENOUS
  Administered 2017-04-12: 100 mg via INTRAVENOUS
  Administered 2017-04-12: 50 mg via INTRAVENOUS

## 2017-04-12 MED ORDER — ACETAMINOPHEN 650 MG RE SUPP
650.0000 mg | Freq: Once | RECTAL | Status: AC
  Administered 2017-04-12: 650 mg via RECTAL

## 2017-04-12 MED ORDER — SODIUM CHLORIDE 0.9 % IV SOLN
1500.0000 mg | INTRAVENOUS | Status: AC
  Administered 2017-04-12: 1500 mg via INTRAVENOUS
  Filled 2017-04-12: qty 1500

## 2017-04-12 MED ORDER — METOPROLOL TARTRATE 12.5 MG HALF TABLET: 12.5000 mg | ORAL_TABLET | Freq: Once | ORAL | Status: DC

## 2017-04-12 MED ORDER — MIDAZOLAM HCL 2 MG/2ML IJ SOLN: 2.0000 mg | Freq: Once | INTRAMUSCULAR | Status: AC

## 2017-04-12 MED ORDER — PROPOFOL 500 MG/50ML IV EMUL
INTRAVENOUS | Status: AC
  Filled 2017-04-12: qty 50

## 2017-04-12 MED ORDER — LISINOPRIL 5 MG PO TABS: 5.0000 mg | ORAL_TABLET | Freq: Every day | ORAL | Status: DC

## 2017-04-12 MED ORDER — PRO-STAT SUGAR FREE PO LIQD: 30.0000 mL | Freq: Two times a day (BID) | ORAL | Status: DC

## 2017-04-12 MED ORDER — CEFUROXIME SODIUM 1.5 G IV SOLR
1.5000 g | INTRAVENOUS | Status: AC
  Administered 2017-04-12: 1.5 g via INTRAVENOUS
  Administered 2017-04-12: .75 g via INTRAVENOUS
  Filled 2017-04-12: qty 1.5

## 2017-04-12 MED ORDER — VANCOMYCIN HCL IN DEXTROSE 1-5 GM/200ML-% IV SOLN
1000.0000 mg | Freq: Once | INTRAVENOUS | Status: AC
  Administered 2017-04-13: 1000 mg via INTRAVENOUS
  Filled 2017-04-12: qty 200

## 2017-04-12 MED ORDER — DEXAMETHASONE SODIUM PHOSPHATE 10 MG/ML IJ SOLN
INTRAMUSCULAR | Status: AC
  Filled 2017-04-12: qty 1

## 2017-04-12 MED ORDER — SODIUM CHLORIDE 0.9 % IJ SOLN
OROMUCOSAL | Status: DC | PRN
  Administered 2017-04-12: 4 mL via TOPICAL

## 2017-04-12 MED ORDER — HEPARIN SODIUM (PORCINE) 1000 UNIT/ML IJ SOLN
INTRAMUSCULAR | Status: DC | PRN
  Administered 2017-04-12: 27000 [IU] via INTRAVENOUS
  Administered 2017-04-12: 5000 [IU] via INTRAVENOUS

## 2017-04-12 MED ORDER — ATORVASTATIN CALCIUM 80 MG PO TABS: 80.0000 mg | ORAL_TABLET | Freq: Every day | ORAL | Status: DC

## 2017-04-12 MED ORDER — MIDAZOLAM HCL 2 MG/2ML IJ SOLN
INTRAMUSCULAR | Status: AC
  Administered 2017-04-12: 2 mg
  Filled 2017-04-12: qty 4

## 2017-04-12 MED ORDER — HEPARIN SODIUM (PORCINE) 5000 UNIT/ML IJ SOLN: 5000.0000 [IU] | Freq: Three times a day (TID) | INTRAMUSCULAR | Status: DC

## 2017-04-12 MED ORDER — SODIUM CHLORIDE 0.9 % IV SOLN
0.0000 ug/min | INTRAVENOUS | Status: DC
  Administered 2017-04-12: 75 ug/min via INTRAVENOUS
  Administered 2017-04-12: 10 ug/min via INTRAVENOUS
  Filled 2017-04-12 (×3): qty 1

## 2017-04-12 MED ORDER — SODIUM CHLORIDE 0.9 % IJ SOLN
INTRAMUSCULAR | Status: DC | PRN
  Administered 2017-04-12: 4 mL via TOPICAL

## 2017-04-12 MED ORDER — TEMAZEPAM 15 MG PO CAPS: 15.0000 mg | ORAL_CAPSULE | Freq: Once | ORAL | Status: DC | PRN

## 2017-04-12 MED ORDER — METOPROLOL SUCCINATE ER 25 MG PO TB24
12.5000 mg | ORAL_TABLET | Freq: Every day | ORAL | Status: DC
  Filled 2017-04-12: qty 1

## 2017-04-12 MED ORDER — DIPHENHYDRAMINE HCL 50 MG/ML IJ SOLN
INTRAMUSCULAR | Status: AC
  Filled 2017-04-12: qty 1

## 2017-04-12 MED ORDER — DEXTROSE 5 % SOLN FOR IMPELLA PURGE CATHETER
INTRAVENOUS | Status: DC
  Filled 2017-04-12: qty 1000

## 2017-04-12 MED ORDER — PHENYLEPHRINE 40 MCG/ML (10ML) SYRINGE FOR IV PUSH (FOR BLOOD PRESSURE SUPPORT)
PREFILLED_SYRINGE | INTRAVENOUS | Status: AC
  Filled 2017-04-12: qty 20

## 2017-04-12 MED ORDER — THROMBIN (RECOMBINANT) 5000 UNITS EX SOLR
CUTANEOUS | Status: AC
  Filled 2017-04-12: qty 5000

## 2017-04-12 MED ORDER — DOCUSATE SODIUM 100 MG PO CAPS: 200.0000 mg | ORAL_CAPSULE | Freq: Every day | ORAL | Status: DC

## 2017-04-12 MED ORDER — DEXMEDETOMIDINE HCL IN NACL 400 MCG/100ML IV SOLN
0.1000 ug/kg/h | INTRAVENOUS | Status: AC
  Administered 2017-04-12: .7 ug/kg/h via INTRAVENOUS
  Filled 2017-04-12: qty 100

## 2017-04-12 MED ORDER — SODIUM CHLORIDE 0.9 % IV SOLN: 250.0000 mL | INTRAVENOUS | Status: DC

## 2017-04-12 MED ORDER — CALCIUM CHLORIDE 10 % IV SOLN
INTRAVENOUS | Status: AC
  Filled 2017-04-12: qty 20

## 2017-04-12 MED ORDER — POTASSIUM CHLORIDE 10 MEQ/50ML IV SOLN
10.0000 meq | Freq: Once | INTRAVENOUS | Status: AC
  Administered 2017-04-13: 10 meq via INTRAVENOUS
  Filled 2017-04-12: qty 50

## 2017-04-12 MED ORDER — ACETAMINOPHEN 160 MG/5ML PO SOLN: 650.0000 mg | Freq: Once | ORAL | Status: AC

## 2017-04-12 MED ORDER — HYDRALAZINE HCL 20 MG/ML IJ SOLN: 5.0000 mg | INTRAMUSCULAR | Status: DC | PRN

## 2017-04-12 MED ORDER — PAPAVERINE HCL 30 MG/ML IJ SOLN
INTRAMUSCULAR | Status: AC
  Administered 2017-04-12: 500 mL
  Filled 2017-04-12: qty 2.5

## 2017-04-12 MED ORDER — COAGULATION FACTOR VIIA RECOMB 1 MG IV SOLR
45.0000 ug/kg | Freq: Once | INTRAVENOUS | Status: AC
  Administered 2017-04-12 (×2): 1500 ug via INTRAVENOUS
  Administered 2017-04-12: 1000 ug via INTRAVENOUS
  Filled 2017-04-12: qty 4

## 2017-04-12 MED ORDER — CEFTRIAXONE SODIUM 1 G IJ SOLR
1.0000 g | Freq: Every day | INTRAMUSCULAR | Status: DC
  Administered 2017-04-12: 1 g via INTRAVENOUS
  Filled 2017-04-12 (×2): qty 10

## 2017-04-12 MED ORDER — MORPHINE SULFATE (PF) 4 MG/ML IV SOLN
2.0000 mg | INTRAVENOUS | Status: DC | PRN
  Administered 2017-04-13 (×2): 4 mg via INTRAVENOUS
  Filled 2017-04-12 (×2): qty 1

## 2017-04-12 MED ORDER — METOPROLOL TARTRATE 5 MG/5ML IV SOLN: 2.5000 mg | INTRAVENOUS | Status: DC | PRN

## 2017-04-12 MED ORDER — HEPARIN SODIUM (PORCINE) 5000 UNIT/ML IJ SOLN
50000.0000 [IU] | INTRAVENOUS | Status: DC
  Administered 2017-04-12: 50000 [IU]
  Filled 2017-04-12: qty 10

## 2017-04-12 MED ORDER — TRANEXAMIC ACID (OHS) PUMP PRIME SOLUTION
2.0000 mg/kg | INTRAVENOUS | Status: DC
  Filled 2017-04-12: qty 1.82

## 2017-04-12 MED ORDER — INSULIN REGULAR HUMAN 100 UNIT/ML IJ SOLN
INTRAMUSCULAR | Status: AC
  Administered 2017-04-12: .9 [IU]/h via INTRAVENOUS
  Filled 2017-04-12: qty 1

## 2017-04-12 MED ORDER — LACTATED RINGERS IV SOLN
INTRAVENOUS | Status: DC | PRN
  Administered 2017-04-12 (×4): via INTRAVENOUS

## 2017-04-12 MED ORDER — PROTAMINE SULFATE 10 MG/ML IV SOLN
INTRAVENOUS | Status: AC
  Filled 2017-04-12: qty 25

## 2017-04-12 MED ORDER — ORAL CARE MOUTH RINSE
15.0000 mL | Freq: Four times a day (QID) | OROMUCOSAL | Status: DC
  Administered 2017-04-12: 15 mL via OROMUCOSAL

## 2017-04-12 MED ORDER — MAGNESIUM SULFATE 4 GM/100ML IV SOLN
4.0000 g | Freq: Once | INTRAVENOUS | Status: AC
  Administered 2017-04-12: 4 g via INTRAVENOUS
  Filled 2017-04-12: qty 100

## 2017-04-12 MED ORDER — MIDAZOLAM HCL 2 MG/2ML IJ SOLN
4.0000 mg | Freq: Once | INTRAMUSCULAR | Status: AC
  Administered 2017-04-12: 4 mg via INTRAVENOUS

## 2017-04-12 MED ORDER — PANTOPRAZOLE SODIUM 40 MG IV SOLR
40.0000 mg | Freq: Two times a day (BID) | INTRAVENOUS | Status: DC
  Administered 2017-04-12 (×2): 40 mg via INTRAVENOUS
  Filled 2017-04-12 (×2): qty 40

## 2017-04-12 MED ORDER — TRANEXAMIC ACID 1000 MG/10ML IV SOLN
1.5000 mg/kg/h | INTRAVENOUS | Status: DC
  Filled 2017-04-12: qty 25

## 2017-04-12 MED ORDER — LACTATED RINGERS IV SOLN
INTRAVENOUS | Status: DC | PRN
  Administered 2017-04-12: 14:00:00 via INTRAVENOUS

## 2017-04-12 MED ORDER — PROTAMINE SULFATE 10 MG/ML IV SOLN
INTRAVENOUS | Status: DC | PRN
  Administered 2017-04-12: 10 mg via INTRAVENOUS
  Administered 2017-04-12: 240 mg via INTRAVENOUS

## 2017-04-12 MED FILL — Heparin Sodium (Porcine) 2 Unit/ML in Sodium Chloride 0.9%: INTRAMUSCULAR | Qty: 500 | Status: AC

## 2017-04-12 SURGICAL SUPPLY — 78 items
BAG DECANTER FOR FLEXI CONT (MISCELLANEOUS) ×3 IMPLANT
BANDAGE ACE 4X5 VEL STRL LF (GAUZE/BANDAGES/DRESSINGS) ×3 IMPLANT
BANDAGE ACE 6X5 VEL STRL LF (GAUZE/BANDAGES/DRESSINGS) ×3 IMPLANT
BANDAGE ESMARK 6X9 LF (GAUZE/BANDAGES/DRESSINGS) ×2 IMPLANT
BLADE STERNUM SYSTEM 6 (BLADE) ×3 IMPLANT
BNDG ESMARK 6X9 LF (GAUZE/BANDAGES/DRESSINGS) ×3
BNDG GAUZE ELAST 4 BULKY (GAUZE/BANDAGES/DRESSINGS) ×3 IMPLANT
CANISTER SUCT 3000ML PPV (MISCELLANEOUS) ×3 IMPLANT
CATH CPB KIT GERHARDT (MISCELLANEOUS) ×3 IMPLANT
CATH THORACIC 28FR (CATHETERS) ×3 IMPLANT
CRADLE DONUT ADULT HEAD (MISCELLANEOUS) ×3 IMPLANT
DRAIN CHANNEL 28F RND 3/8 FF (WOUND CARE) ×3 IMPLANT
DRAPE CARDIOVASCULAR INCISE (DRAPES) ×1
DRAPE SLUSH/WARMER DISC (DRAPES) ×3 IMPLANT
DRAPE SRG 135X102X78XABS (DRAPES) ×2 IMPLANT
DRSG AQUACEL AG ADV 3.5X14 (GAUZE/BANDAGES/DRESSINGS) ×3 IMPLANT
ELECT BLADE 4.0 EZ CLEAN MEGAD (MISCELLANEOUS) ×3
ELECT REM PT RETURN 9FT ADLT (ELECTROSURGICAL) ×6
ELECTRODE BLDE 4.0 EZ CLN MEGD (MISCELLANEOUS) ×2 IMPLANT
ELECTRODE REM PT RTRN 9FT ADLT (ELECTROSURGICAL) ×4 IMPLANT
FELT TEFLON 1X6 (MISCELLANEOUS) ×6 IMPLANT
GAUZE SPONGE 4X4 12PLY STRL (GAUZE/BANDAGES/DRESSINGS) ×6 IMPLANT
GAUZE SPONGE 4X4 12PLY STRL LF (GAUZE/BANDAGES/DRESSINGS) ×3 IMPLANT
GAUZE XEROFORM 5X9 LF (GAUZE/BANDAGES/DRESSINGS) ×3 IMPLANT
GLOVE BIO SURGEON STRL SZ 6.5 (GLOVE) ×9 IMPLANT
GLOVE BIOGEL M 6.5 STRL (GLOVE) ×18 IMPLANT
GLOVE BIOGEL M STRL SZ7.5 (GLOVE) ×12 IMPLANT
GLOVE BIOGEL PI IND STRL 6.5 (GLOVE) ×12 IMPLANT
GLOVE BIOGEL PI INDICATOR 6.5 (GLOVE) ×6
GOWN STRL REUS W/ TWL LRG LVL3 (GOWN DISPOSABLE) ×28 IMPLANT
GOWN STRL REUS W/TWL LRG LVL3 (GOWN DISPOSABLE) ×14
GRAFT HEMASHIELD 10MM (Graft) ×1 IMPLANT
GRAFT VASC STRG 30X10STRL (Graft) ×2 IMPLANT
HEMOSTAT POWDER SURGIFOAM 1G (HEMOSTASIS) ×9 IMPLANT
HEMOSTAT SURGICEL 2X14 (HEMOSTASIS) ×3 IMPLANT
KIT BASIN OR (CUSTOM PROCEDURE TRAY) ×3 IMPLANT
KIT CATH SUCT 8FR (CATHETERS) ×3 IMPLANT
KIT ROOM TURNOVER OR (KITS) ×3 IMPLANT
KIT SUCTION CATH 14FR (SUCTIONS) ×6 IMPLANT
KIT VASOVIEW HEMOPRO VH 3000 (KITS) ×3 IMPLANT
LEAD PACING MYOCARDI (MISCELLANEOUS) ×3 IMPLANT
LINE VENT (MISCELLANEOUS) ×3 IMPLANT
MARKER GRAFT CORONARY BYPASS (MISCELLANEOUS) ×9 IMPLANT
NS IRRIG 1000ML POUR BTL (IV SOLUTION) ×15 IMPLANT
PACK OPEN HEART (CUSTOM PROCEDURE TRAY) ×3 IMPLANT
PAD ARMBOARD 7.5X6 YLW CONV (MISCELLANEOUS) ×6 IMPLANT
PAD ELECT DEFIB RADIOL ZOLL (MISCELLANEOUS) ×3 IMPLANT
PENCIL BUTTON HOLSTER BLD 10FT (ELECTRODE) ×3 IMPLANT
PUMP SET IMPELLA LD (CATHETERS) ×3 IMPLANT
SEALANT SURG COSEAL 8ML (VASCULAR PRODUCTS) ×3 IMPLANT
SET CARDIOPLEGIA MPS 5001102 (MISCELLANEOUS) ×3 IMPLANT
SPONGE LAP 18X18 X RAY DECT (DISPOSABLE) ×9 IMPLANT
SUT BONE WAX W31G (SUTURE) ×3 IMPLANT
SUT PROLENE 2 0 MH 48 (SUTURE) ×39 IMPLANT
SUT PROLENE 3 0 SH1 36 (SUTURE) ×9 IMPLANT
SUT PROLENE 4 0 RB 1 (SUTURE) ×3
SUT PROLENE 4 0 TF (SUTURE) ×6 IMPLANT
SUT PROLENE 4-0 RB1 .5 CRCL 36 (SUTURE) ×6 IMPLANT
SUT PROLENE 6 0 CC (SUTURE) ×6 IMPLANT
SUT PROLENE 7 0 BV1 MDA (SUTURE) ×3 IMPLANT
SUT SILK  1 MH (SUTURE) ×3
SUT SILK 1 MH (SUTURE) ×6 IMPLANT
SUT SILK 2 0 SH CR/8 (SUTURE) ×3 IMPLANT
SUT STEEL 6MS V (SUTURE) ×3 IMPLANT
SUT STEEL SZ 6 DBL 3X14 BALL (SUTURE) ×3 IMPLANT
SUT VIC AB 1 CTX 18 (SUTURE) ×6 IMPLANT
SUTURE E-PAK OPEN HEART (SUTURE) ×3 IMPLANT
SYSTEM SAHARA CHEST DRAIN ATS (WOUND CARE) ×3 IMPLANT
TAPE CLOTH SURG 4X10 WHT LF (GAUZE/BANDAGES/DRESSINGS) ×3 IMPLANT
TAPE PAPER 2X10 WHT MICROPORE (GAUZE/BANDAGES/DRESSINGS) ×3 IMPLANT
TOWEL GREEN STERILE (TOWEL DISPOSABLE) ×12 IMPLANT
TOWEL GREEN STERILE FF (TOWEL DISPOSABLE) ×6 IMPLANT
TOWEL OR 17X24 6PK STRL BLUE (TOWEL DISPOSABLE) ×6 IMPLANT
TOWEL OR 17X26 10 PK STRL BLUE (TOWEL DISPOSABLE) ×6 IMPLANT
TRAY FOLEY SILVER 16FR TEMP (SET/KITS/TRAYS/PACK) ×3 IMPLANT
TUBING INSUFFLATION (TUBING) ×3 IMPLANT
UNDERPAD 30X30 (UNDERPADS AND DIAPERS) ×3 IMPLANT
WATER STERILE IRR 1000ML POUR (IV SOLUTION) ×6 IMPLANT

## 2017-04-12 NOTE — Progress Notes (Signed)
  Echocardiogram 2D Echocardiogram has been performed.  Leta JunglingCooper, Graceanna Theissen M 04/12/2017, 10:09 AM

## 2017-04-12 NOTE — Interval H&P Note (Signed)
History and Physical Interval Note:  04/12/2017 12:06 PM  Ronald HoveGary Tisdel  has presented today for surgery, with the diagnosis of STEMI  The various methods of treatment have been discussed with the patient and family. After consideration of risks, benefits and other options for treatment, the patient has consented to  Procedure(s): Coronary/Graft Acute MI Revascularization (N/A) LEFT HEART CATH AND CORONARY ANGIOGRAPHY (N/A) Ultrasound Guidance For Vascular Access CORONARY STENT INTERVENTION (N/A) IABP Insertion (N/A) Coronary Thrombectomy (N/A) as a surgical intervention .  The patient's history has been reviewed, patient examined, no change in status, stable for surgery.  I have reviewed the patient's chart and labs.  Questions were answered to the patient's satisfaction.     Eliberto Sole [employer]

## 2017-04-12 NOTE — Procedures (Signed)
Central Venous Catheter Insertion Procedure Note [name] [id number] Jan 02, 1951  Procedure: Insertion of Central Venous Catheter Indications: Assessment of intravascular volume  Procedure Details Consent: Risks of procedure as well as the alternatives and risks of each were explained to the (patient/caregiver).  Consent for procedure obtained. Time Out: Verified patient identification, verified procedure, site/side was marked, verified correct patient position, special equipment/implants available, medications/allergies/relevent history reviewed, required imaging and test results available.  Performed  Maximum sterile technique was used including antiseptics, cap, gloves, gown, hand hygiene, mask and sheet. Skin prep: Chlorhexidine; local anesthetic administered A antimicrobial bonded/coated triple lumen catheter was placed in the right internal jugular vein using the Seldinger technique.  Evaluation Blood flow good Complications: No apparent complications Patient did tolerate procedure well. Chest X-ray ordered to verify placement.  CXR: pending.  [name], Daniel MD 04/12/2017, 9:15 AM

## 2017-04-12 NOTE — Progress Notes (Signed)
Orthopedic Tech Progress Note Patient Details:  Ronald HoveGary [name] February 23, 1951 [id number]  Ortho Devices Type of Ortho Device: Knee Immobilizer Ortho Device/Splint Location: rle Ortho Device/Splint Interventions: Ordered, Adjustment, Application   [name], [name] 04/12/2017, 1:36 AM

## 2017-04-12 NOTE — H&P (View-Only) (Signed)
Advanced Heart Failure Team Consult Note   Primary Physician: Primary Cardiologist:    Reason for Consultation: Cardiogenic Shock   HPI:    Arlana HoveGary Netherland is seen today for evaluation of cardiogenic shock at the request of Dr Isabel CapriceVaranassi.   [name] is 66 year old with h/o MI, HTN, COPD admitted with chest pian. EKG with evidence of inferior/lateral STEMI. Due to lethargy and hypoxemia he was intubated in the ED.    Taken to the cath lab as noted below. RCA DES and balloon angioplasty to mid lad with 25% residual stenosis. IABP placed due to high LVEDP. He has received 40 mg IV lasix. Started on neo for hypotension. Remains on neo 160 mcg. Remains intubated.    LHC 04/11/2017   Mid LAD-2 lesion is 100% stenosed.  Balloon angioplasty was performed using a BALLOON SAPPHIRE 2.5X12. Post intervention, there is a 25% residual stenosis with significant no reflow in the apical LAD.  Dist RCA lesion is 95% stenosed. A drug-eluting stent was successfully placed using a STENT SYNERGY DES 3.5X28.  Post intervention, there is a 0% residual stenosis.  LV end diastolic pressure is severely elevated.  There is no aortic valve stenosis.  Ost 1st Diag lesion is 50% stenosed.  Proximal ramus is 70% stenosed.  IABP placed due to high LVEDP.  Review of Systems: [y] = yes, [ ]  = no Patient is encephalopathic and or intubated. Therefore history has been obtained from chart review.   General: Weight gain [ ] ; Weight loss [ ] ; Anorexia [ ] ; Fatigue [ ] ; Fever [ ] ; Chills [ ] ; Weakness [ ]   Cardiac: Chest pain/pressure [ ] ; Resting SOB [ ] ; Exertional SOB [ ] ; Orthopnea [ ] ; Pedal Edema [ ] ; Palpitations [ ] ; Syncope [ ] ; Presyncope [ ] ; Paroxysmal nocturnal dyspnea[ ]   Pulmonary: Cough [ ] ; Wheezing[ ] ; Hemoptysis[ ] ; Sputum [ ] ; Snoring [ ]   GI: Vomiting[ ] ; Dysphagia[ ] ; Melena[ ] ; Hematochezia [ ] ; Heartburn[ ] ; Abdominal pain [ ] ; Constipation [ ] ; Diarrhea [ ] ; BRBPR [ ]   GU: Hematuria[ ] ;  Dysuria [ ] ; Nocturia[ ]   Vascular: Pain in legs with walking [ ] ; Pain in feet with lying flat [ ] ; Non-healing sores [ ] ; Stroke [ ] ; TIA [ ] ; Slurred speech [ ] ;  Neuro: Headaches[ ] ; Vertigo[ ] ; Seizures[ ] ; Paresthesias[ ] ;Blurred vision [ ] ; Diplopia [ ] ; Vision changes [ ]   Ortho/Skin: Arthritis [ ] ; Joint pain [ ] ; Muscle pain [ ] ; Joint swelling [ ] ; Back Pain [ ] ; Rash [ ]   Psych: Depression[ ] ; Anxiety[ ]   Heme: Bleeding problems [ ] ; Clotting disorders [ ] ; Anemia [ ]   Endocrine: Diabetes [ ] ; Thyroid dysfunction[ ]   Home Medications Prior to Admission medications   Not on File    Past Medical History: Past Medical History:  Diagnosis Date  . Coronary artery disease   . Hypertension     Past Surgical History: History reviewed. No pertinent surgical history.  No family available to obtain additional information.    Family History: History reviewed. No pertinent family history. No family available to obtain additional information.   Social History: Social History   Socioeconomic History  . Marital status: Divorced    Spouse name: None  . Number of children: None  . Years of education: None  . Highest education level: None  Social Needs  . Financial resource strain: None  . Food insecurity - worry: None  . Food insecurity - inability:  None  . Transportation needs - medical: None  . Transportation needs - non-medical: None  Occupational History  . None  Tobacco Use  . Smoking status: Current Every Day Smoker  . Smokeless tobacco: Never Used  Substance and Sexual Activity  . Alcohol use: None  . Drug use: None  . Sexual activity: None  Other Topics Concern  . None  Social History Narrative  . None    Allergies:  No Known Allergies  Objective:    Vital Signs:   Temp:  [94.6 F (34.8 C)-97.7 F (36.5 C)] 97.7 F (36.5 C) (11/14 0730) Pulse Rate:  [39-224] 75 (11/14 0730) Resp:  [13-42] 18 (11/14 0730) BP: (48-206)/(34-120) 48/39 (11/14  0500) SpO2:  [0 %-100 %] 97 % (11/14 0730) Arterial Line BP: (40-97)/(35-65) 61/47 (11/14 0730) FiO2 (%):  [50 %-100 %] 50 % (11/14 0325) Weight:  [160 lb (72.6 kg)-215 lb 6.2 oz (97.7 kg)] 200 lb 6.4 oz (90.9 kg) (11/14 0615) Last BM Date: (PTA)  Weight change: Filed Weights   04/11/17 11-05-30 04/12/17 0030 04/12/17 0615  Weight: 160 lb (72.6 kg) 215 lb 6.2 oz (97.7 kg) 200 lb 6.4 oz (90.9 kg)    Intake/Output:   Intake/Output Summary (Last 24 hours) at 04/12/2017 0809 Last data filed at 04/12/2017 0800 Gross per 24 hour  Intake 1127.64 ml  Output 2070 ml  Net -942.36 ml      Physical Exam    General: Intubated. Awake. Follow scommands HEENT: normal. ETT  Neck: supple. JVP to jaw  . Carotids 2+ bilat; no bruits. No lymphadenopathy or thyromegaly appreciated. Cor: PMI nondisplaced. Regular rate & rhythm. 3/6 <R Lungs: EW decreased in the base.  Abdomen: soft, nontender, nondistended. No hepatosplenomegaly. No bruits or masses. Good bowel sounds. Extremities: no cyanosis, clubbing, rash, edema. Extremities warm. Able to doppler R and L pedal pulse. R groin IABP.  Neuro: Intubated. Follows commands.    Telemetry  NSR 70s    EKG   NSr 72 bpm from 04/11/17  Anterior q waves. No st elevation.  Personally reviewed    Labs   Basic Metabolic Panel: Recent Labs  Lab 04/11/17 11/04/2136 04/12/17 0057 04/12/17 0605  NA 138 140 140  K 4.0 4.3 4.2  CL 107 109 107  CO2 19* 24 24  GLUCOSE 249* 150* 119*  BUN 19 20 19   CREATININE 1.67* 1.52* 1.42*  CALCIUM 8.4* 8.1* 7.8*    Liver Function Tests: Recent Labs  Lab 04/11/17 November 04, 2136 04/12/17 0057  AST 32 39  ALT 20 27  ALKPHOS 86 75  BILITOT 0.5 0.7  PROT 6.9 6.3*  ALBUMIN 3.4* 3.0*   No results for input(s): LIPASE, AMYLASE in the last 168 hours. No results for input(s): AMMONIA in the last 168 hours.  CBC: Recent Labs  Lab 04/11/17 11-04-2136 04/12/17 0057 04/12/17 0605  WBC 13.9* 19.6* 12.6*  NEUTROABS 7.2  --   --    HGB 14.5 13.5 12.1*  HCT 46.7 42.5 38.5*  MCV 93.0 92.2 89.7  PLT 235 194 202    Cardiac Enzymes: Recent Labs  Lab 04/11/17 Nov 04, 2136 04/12/17 0057 04/12/17 0605  TROPONINI 0.09* 0.11* 0.18*    BNP: BNP (last 3 results) No results for input(s): BNP in the last 8760 hours.  ProBNP (last 3 results) No results for input(s): PROBNP in the last 8760 hours.   CBG: No results for input(s): GLUCAP in the last 168 hours.  Coagulation Studies: Recent Labs    04/11/17 November 04, 2136  LABPROT 15.9*  INR 1.28     Imaging   Dg Chest Port 1 View  Result Date: 04/12/2017 CLINICAL DATA:  66 year old male with aortic balloon pump placement. EXAM: PORTABLE CHEST 1 VIEW COMPARISON:  Earlier radiograph dated 04/11/2017 FINDINGS: There has been interval placement of an intra-aortic balloon pump. The radiopaque marker of the pop is located in the proximal descending aorta at the level of the carina. An endotracheal tube is seen with tip approximately 2 cm above the carina. Enteric tube extends into the left hemiabdomen with tip beyond the inferior margin of the image. Bilateral confluent airspace opacities again noted. No large pleural effusion. No pneumothorax. Cardiomegaly. Degenerative changes of the shoulders. No acute osseous pathology. IMPRESSION: 1. Interval placement of an intra-aortic balloon pop with radiopaque marker over the proximal descending thoracic aorta. 2. Endotracheal tube 2 cm above the carina. 3. Bilateral patchy airspace opacities. Electronically Signed   By: Elgie CollardArash  Radparvar M.D.   On: 04/12/2017 01:49   Dg Chest Portable 1 View  Result Date: 04/11/2017 CLINICAL DATA:  Endotracheal tube placement. EXAM: PORTABLE CHEST 1 VIEW COMPARISON:  Chest radiograph performed 05/11/2010 FINDINGS: The patient's endotracheal tube is seen ending 3-4 cm above the carina. An enteric tube is noted extending below the diaphragm. Diffuse bilateral airspace opacification is compatible with pneumonia,  though superimposed pulmonary edema cannot be excluded. Vascular congestion is noted. No pleural effusion or pneumothorax is seen. The cardiomediastinal silhouette is mildly enlarged. No acute osseous abnormalities are seen. IMPRESSION: 1. Endotracheal tube seen ending 3-4 cm above the carina. 2. Diffuse bilateral airspace opacification, compatible with pneumonia. Superimposed pulmonary edema cannot be excluded. 3. Vascular congestion and mild cardiomegaly. Electronically Signed   By: Roanna RaiderJeffery  Chang M.D.   On: 04/11/2017 22:25      Medications:     Current Medications: . aspirin  81 mg Oral Daily  . atorvastatin  80 mg Oral q1800  . insulin aspart  2-6 Units Subcutaneous Q4H  . lisinopril  5 mg Oral Daily  . metoprolol succinate  12.5 mg Oral Daily  . pantoprazole (PROTONIX) IV  40 mg Intravenous Q12H  . sodium chloride flush  3 mL Intravenous Q12H  . sodium chloride flush  3 mL Intravenous Q12H  . ticagrelor  90 mg Oral BID     Infusions: . sodium chloride    . sodium chloride    . azithromycin Stopped (04/12/17 0415)  . cefTRIAXone (ROCEPHIN)  IV Stopped (04/12/17 19140312)  . fentaNYL infusion INTRAVENOUS 200 mcg/hr (04/12/17 0600)  . heparin 1,250 Units/hr (04/12/17 0600)  . phenylephrine (NEO-SYNEPHRINE) Adult infusion 140 mcg/min (04/12/17 0749)  .  sodium bicarbonate (isotonic) infusion in sterile water 75 mL/hr at 04/12/17 0600       Patient Profile   [name] is 66 year old with h/o MI, HTN, COPD admitted with chest pain. EKG with evidence of inferior/lateral STEMI. Due to lethargy and hypoxemia he was intubated in the ED.  Taken to cath lab with RCA DES and POBA to chronically occluded LAD with IABP placed.    Assessment/Plan  1. STEMI -LHC -RCA DES and ballon angioplasty to mid lad with 25% residual stenosis. IABP placed due to high LVEDP. -On asa, brillinta, statin,  2. Acute Hypoxic Respiratory Failure  Intubated 11/13 prior to cath CCM following.  3.  Cardiogenic Shock  -Has IABP. Place central line. Check CO-OX CVP.  -SBP soft on neo . Anticipate starting norepi.  -Volume status elevated. Start 80 mg IV lasix  twice a day. Start after central line placed.  -No bb. No ARB for now.  -ECHO pending. 4. AKI  Admit creatinine 1.67>1.52>1.4  Follow BMET.  5. COPD.    Length of Stay: 1  Amy Clegg, NP  04/12/2017, 8:09 AM  Advanced Heart Failure Team Pager [phone] (M-F; 7a - 4p)  Please contact [facility] for night-coverage after hours (4p -7a ) and weekends on [email or web address]  Agree with above.   He is currently in shock. Cath films reviewed. LAD is totally occluded and appears chronic/sub acute. Unable to be opened. Distal RCA with ulcerated 95% plaque seems to have correlated more with ST elevation on initial ECG but troponin not elevated so suspect this was OOH/subacute MI.   Now intubated and in shock. Systolic BPs low on high-dose neo but augmented MAPs ok on IABP. On exam has significant MR murmur- concern for ischemic MR or other complication.   Central line placed emergently to measure CVP and co-ox. Wil get stat echo. Switch neo to norepi. Appreciate CCM help with vent.   On exam  Intubated following commands Cor RRR 3/6 MR Lungs mild crackles anteriorly Ab soft NT Ext warm + IABP  Await echo and CVP and co-ox to guide further management.   CRITICAL CARE Performed by: Arvilla Meres  Total critical care time: 45 minutes  Critical care time was exclusive of separately billable procedures and treating other patients.  Critical care was necessary to treat or prevent imminent or life-threatening deterioration.  Critical care was time spent personally by me (independent of midlevel providers or residents) on the following activities: development of treatment plan with patient and/or surrogate as well as nursing, discussions with consultants, evaluation of patient's response to treatment, examination of patient,  obtaining history from patient or surrogate, ordering and performing treatments and interventions, ordering and review of laboratory studies, ordering and review of radiographic studies, pulse oximetry and re-evaluation of patient's condition.  Arvilla Meres, MD  9:25 AM

## 2017-04-12 NOTE — Consult Note (Addendum)
Advanced Heart Failure Team Consult Note   Primary Physician: Primary Cardiologist:    Reason for Consultation: Cardiogenic Shock   HPI:    Ronald HoveGary [name] is seen today for evaluation of cardiogenic shock at the request of Dr Isabel CapriceVaranassi.   [name] is 66 year old with h/o MI, HTN, COPD admitted with chest pian. EKG with evidence of inferior/lateral STEMI. Due to lethargy and hypoxemia he was intubated in the ED.    Taken to the cath lab as noted below. RCA DES and balloon angioplasty to mid lad with 25% residual stenosis. IABP placed due to high LVEDP. He has received 40 mg IV lasix. Started on neo for hypotension. Remains on neo 160 mcg. Remains intubated.    LHC 04/11/2017   Mid LAD-2 lesion is 100% stenosed.  Balloon angioplasty was performed using a BALLOON SAPPHIRE 2.5X12. Post intervention, there is a 25% residual stenosis with significant no reflow in the apical LAD.  Dist RCA lesion is 95% stenosed. A drug-eluting stent was successfully placed using a STENT SYNERGY DES 3.5X28.  Post intervention, there is a 0% residual stenosis.  LV end diastolic pressure is severely elevated.  There is no aortic valve stenosis.  Ost 1st Diag lesion is 50% stenosed.  Proximal ramus is 70% stenosed.  IABP placed due to high LVEDP.  Review of Systems: [y] = yes, [ ]  = no Patient is encephalopathic and or intubated. Therefore history has been obtained from chart review.   General: Weight gain [ ] ; Weight loss [ ] ; Anorexia [ ] ; Fatigue [ ] ; Fever [ ] ; Chills [ ] ; Weakness [ ]   Cardiac: Chest pain/pressure [ ] ; Resting SOB [ ] ; Exertional SOB [ ] ; Orthopnea [ ] ; Pedal Edema [ ] ; Palpitations [ ] ; Syncope [ ] ; Presyncope [ ] ; Paroxysmal nocturnal dyspnea[ ]   Pulmonary: Cough [ ] ; Wheezing[ ] ; Hemoptysis[ ] ; Sputum [ ] ; Snoring [ ]   GI: Vomiting[ ] ; Dysphagia[ ] ; Melena[ ] ; Hematochezia [ ] ; Heartburn[ ] ; Abdominal pain [ ] ; Constipation [ ] ; Diarrhea [ ] ; BRBPR [ ]   GU: Hematuria[ ] ;  Dysuria [ ] ; Nocturia[ ]   Vascular: Pain in legs with walking [ ] ; Pain in feet with lying flat [ ] ; Non-healing sores [ ] ; Stroke [ ] ; TIA [ ] ; Slurred speech [ ] ;  Neuro: Headaches[ ] ; Vertigo[ ] ; Seizures[ ] ; Paresthesias[ ] ;Blurred vision [ ] ; Diplopia [ ] ; Vision changes [ ]   Ortho/Skin: Arthritis [ ] ; Joint pain [ ] ; Muscle pain [ ] ; Joint swelling [ ] ; Back Pain [ ] ; Rash [ ]   Psych: Depression[ ] ; Anxiety[ ]   Heme: Bleeding problems [ ] ; Clotting disorders [ ] ; Anemia [ ]   Endocrine: Diabetes [ ] ; Thyroid dysfunction[ ]   Home Medications Prior to Admission medications   Not on File    Past Medical History: Past Medical History:  Diagnosis Date  . Coronary artery disease   . Hypertension     Past Surgical History: History reviewed. No pertinent surgical history.  No family available to obtain additional information.    Family History: History reviewed. No pertinent family history. No family available to obtain additional information.   Social History: Social History   Socioeconomic History  . Marital status: Divorced    Spouse name: None  . Number of children: None  . Years of education: None  . Highest education level: None  Social Needs  . Financial resource strain: None  . Food insecurity - worry: None  . Food insecurity - inability:  None  . Transportation needs - medical: None  . Transportation needs - non-medical: None  Occupational History  . None  Tobacco Use  . Smoking status: Current Every Day Smoker  . Smokeless tobacco: Never Used  Substance and Sexual Activity  . Alcohol use: None  . Drug use: None  . Sexual activity: None  Other Topics Concern  . None  Social History Narrative  . None    Allergies:  No Known Allergies  Objective:    Vital Signs:   Temp:  [94.6 F (34.8 C)-97.7 F (36.5 C)] 97.7 F (36.5 C) (11/14 0730) Pulse Rate:  [39-224] 75 (11/14 0730) Resp:  [13-42] 18 (11/14 0730) BP: (48-206)/(34-120) 48/39 (11/14  0500) SpO2:  [0 %-100 %] 97 % (11/14 0730) Arterial Line BP: (40-97)/(35-65) 61/47 (11/14 0730) FiO2 (%):  [50 %-100 %] 50 % (11/14 0325) Weight:  [160 lb (72.6 kg)-215 lb 6.2 oz (97.7 kg)] 200 lb 6.4 oz (90.9 kg) (11/14 0615) Last BM Date: (PTA)  Weight change: Filed Weights   04/11/17 11-05-30 04/12/17 0030 04/12/17 0615  Weight: 160 lb (72.6 kg) 215 lb 6.2 oz (97.7 kg) 200 lb 6.4 oz (90.9 kg)    Intake/Output:   Intake/Output Summary (Last 24 hours) at 04/12/2017 0809 Last data filed at 04/12/2017 0800 Gross per 24 hour  Intake 1127.64 ml  Output 2070 ml  Net -942.36 ml      Physical Exam    General: Intubated. Awake. Follow scommands HEENT: normal. ETT  Neck: supple. JVP to jaw  . Carotids 2+ bilat; no bruits. No lymphadenopathy or thyromegaly appreciated. Cor: PMI nondisplaced. Regular rate & rhythm. 3/6 <R Lungs: EW decreased in the base.  Abdomen: soft, nontender, nondistended. No hepatosplenomegaly. No bruits or masses. Good bowel sounds. Extremities: no cyanosis, clubbing, rash, edema. Extremities warm. Able to doppler R and L pedal pulse. R groin IABP.  Neuro: Intubated. Follows commands.    Telemetry  NSR 70s    EKG   NSr 72 bpm from 04/11/17  Anterior q waves. No st elevation.  Personally reviewed    Labs   Basic Metabolic Panel: Recent Labs  Lab 04/11/17 11/04/2136 04/12/17 0057 04/12/17 0605  NA 138 140 140  K 4.0 4.3 4.2  CL 107 109 107  CO2 19* 24 24  GLUCOSE 249* 150* 119*  BUN 19 20 19   CREATININE 1.67* 1.52* 1.42*  CALCIUM 8.4* 8.1* 7.8*    Liver Function Tests: Recent Labs  Lab 04/11/17 November 04, 2136 04/12/17 0057  AST 32 39  ALT 20 27  ALKPHOS 86 75  BILITOT 0.5 0.7  PROT 6.9 6.3*  ALBUMIN 3.4* 3.0*   No results for input(s): LIPASE, AMYLASE in the last 168 hours. No results for input(s): AMMONIA in the last 168 hours.  CBC: Recent Labs  Lab 04/11/17 11-04-2136 04/12/17 0057 04/12/17 0605  WBC 13.9* 19.6* 12.6*  NEUTROABS 7.2  --   --    HGB 14.5 13.5 12.1*  HCT 46.7 42.5 38.5*  MCV 93.0 92.2 89.7  PLT 235 194 202    Cardiac Enzymes: Recent Labs  Lab 04/11/17 Nov 04, 2136 04/12/17 0057 04/12/17 0605  TROPONINI 0.09* 0.11* 0.18*    BNP: BNP (last 3 results) No results for input(s): BNP in the last 8760 hours.  ProBNP (last 3 results) No results for input(s): PROBNP in the last 8760 hours.   CBG: No results for input(s): GLUCAP in the last 168 hours.  Coagulation Studies: Recent Labs    04/11/17 November 04, 2136  LABPROT 15.9*  INR 1.28     Imaging   Dg Chest Port 1 View  Result Date: 04/12/2017 CLINICAL DATA:  66 year old male with aortic balloon pump placement. EXAM: PORTABLE CHEST 1 VIEW COMPARISON:  Earlier radiograph dated 04/11/2017 FINDINGS: There has been interval placement of an intra-aortic balloon pump. The radiopaque marker of the pop is located in the proximal descending aorta at the level of the carina. An endotracheal tube is seen with tip approximately 2 cm above the carina. Enteric tube extends into the left hemiabdomen with tip beyond the inferior margin of the image. Bilateral confluent airspace opacities again noted. No large pleural effusion. No pneumothorax. Cardiomegaly. Degenerative changes of the shoulders. No acute osseous pathology. IMPRESSION: 1. Interval placement of an intra-aortic balloon pop with radiopaque marker over the proximal descending thoracic aorta. 2. Endotracheal tube 2 cm above the carina. 3. Bilateral patchy airspace opacities. Electronically Signed   By: Elgie CollardArash  Radparvar M.D.   On: 04/12/2017 01:49   Dg Chest Portable 1 View  Result Date: 04/11/2017 CLINICAL DATA:  Endotracheal tube placement. EXAM: PORTABLE CHEST 1 VIEW COMPARISON:  Chest radiograph performed 05/11/2010 FINDINGS: The patient's endotracheal tube is seen ending 3-4 cm above the carina. An enteric tube is noted extending below the diaphragm. Diffuse bilateral airspace opacification is compatible with pneumonia,  though superimposed pulmonary edema cannot be excluded. Vascular congestion is noted. No pleural effusion or pneumothorax is seen. The cardiomediastinal silhouette is mildly enlarged. No acute osseous abnormalities are seen. IMPRESSION: 1. Endotracheal tube seen ending 3-4 cm above the carina. 2. Diffuse bilateral airspace opacification, compatible with pneumonia. Superimposed pulmonary edema cannot be excluded. 3. Vascular congestion and mild cardiomegaly. Electronically Signed   By: Roanna RaiderJeffery  Chang M.D.   On: 04/11/2017 22:25      Medications:     Current Medications: . aspirin  81 mg Oral Daily  . atorvastatin  80 mg Oral q1800  . insulin aspart  2-6 Units Subcutaneous Q4H  . lisinopril  5 mg Oral Daily  . metoprolol succinate  12.5 mg Oral Daily  . pantoprazole (PROTONIX) IV  40 mg Intravenous Q12H  . sodium chloride flush  3 mL Intravenous Q12H  . sodium chloride flush  3 mL Intravenous Q12H  . ticagrelor  90 mg Oral BID     Infusions: . sodium chloride    . sodium chloride    . azithromycin Stopped (04/12/17 0415)  . cefTRIAXone (ROCEPHIN)  IV Stopped (04/12/17 [id number])  . fentaNYL infusion INTRAVENOUS 200 mcg/hr (04/12/17 0600)  . heparin 1,250 Units/hr (04/12/17 0600)  . phenylephrine (NEO-SYNEPHRINE) Adult infusion 140 mcg/min (04/12/17 0749)  .  sodium bicarbonate (isotonic) infusion in sterile water 75 mL/hr at 04/12/17 0600       Patient Profile   [name] is 66 year old with h/o MI, HTN, COPD admitted with chest pain. EKG with evidence of inferior/lateral STEMI. Due to lethargy and hypoxemia he was intubated in the ED.  Taken to cath lab with RCA DES and POBA to chronically occluded LAD with IABP placed.    Assessment/Plan  1. STEMI -LHC -RCA DES and ballon angioplasty to mid lad with 25% residual stenosis. IABP placed due to high LVEDP. -On asa, brillinta, statin,  2. Acute Hypoxic Respiratory Failure  Intubated 11/13 prior to cath CCM following.  3.  Cardiogenic Shock  -Has IABP. Place central line. Check CO-OX CVP.  -SBP soft on neo . Anticipate starting norepi.  -Volume status elevated. Start 80 mg IV lasix  twice a day. Start after central line placed.  -No bb. No ARB for now.  -ECHO pending. 4. AKI  Admit creatinine 1.67>1.52>1.4  Follow BMET.  5. COPD.    Length of Stay: 1  Amy Clegg, NP  04/12/2017, 8:09 AM  Advanced Heart Failure Team Pager [phone] (M-F; 7a - 4p)  Please contact [facility] for night-coverage after hours (4p -7a ) and weekends on [email or web address]  Agree with above.   He is currently in shock. Cath films reviewed. LAD is totally occluded and appears chronic/sub acute. Unable to be opened. Distal RCA with ulcerated 95% plaque seems to have correlated more with ST elevation on initial ECG but troponin not elevated so suspect this was OOH/subacute MI.   Now intubated and in shock. Systolic BPs low on high-dose neo but augmented MAPs ok on IABP. On exam has significant Ronald murmur- concern for ischemic Ronald or other complication.   Central line placed emergently to measure CVP and co-ox. Wil get stat echo. Switch neo to norepi. Appreciate CCM help with vent.   On exam  Intubated following commands Cor RRR 3/6 Ronald Lungs mild crackles anteriorly Ab soft NT Ext warm + IABP  Await echo and CVP and co-ox to guide further management.   CRITICAL CARE Performed by: Arvilla Meres  Total critical care time: 45 minutes  Critical care time was exclusive of separately billable procedures and treating other patients.  Critical care was necessary to treat or prevent imminent or life-threatening deterioration.  Critical care was time spent personally by me (independent of midlevel providers or residents) on the following activities: development of treatment plan with patient and/or surrogate as well as nursing, discussions with consultants, evaluation of patient's response to treatment, examination of patient,  obtaining history from patient or surrogate, ordering and performing treatments and interventions, ordering and review of laboratory studies, ordering and review of radiographic studies, pulse oximetry and re-evaluation of patient's condition.  Arvilla Meres, MD  9:25 AM

## 2017-04-12 NOTE — Progress Notes (Signed)
[facility]ards [facility] paged and made aware of no pulse in right foot and minimal pulse in left. Toes cool/pale/dusky. Also relayed current vital signs and clarified order for 80mg  lasix. Orders received to half dose to 40mg .  Will continue to monitor.  Modena JanskyKevin Saamiya Jeppsen RN 2 Heart

## 2017-04-12 NOTE — Progress Notes (Signed)
Patient transported from Cath Lab to 2H21 without incident. Report given to unit RT.

## 2017-04-12 NOTE — Procedures (Signed)
Arterial Catheter Insertion Procedure Note [name] [id number] Nov 27, 1950  Procedure: Insertion of Arterial Catheter  Indications: Blood pressure monitoring and Frequent blood sampling  Procedure Details Consent: Risks of procedure as well as the alternatives and risks of each were explained to the (patient/caregiver).  Consent for procedure obtained. and Unable to obtain consent because of emergent medical necessity. Time Out: Verified patient identification, verified procedure, site/side was marked, verified correct patient position, special equipment/implants available, medications/allergies/relevent history reviewed, required imaging and test results available.  Performed  Maximum sterile technique was used including antiseptics, cap, gloves, gown, hand hygiene, mask and sheet. Skin prep: Chlorhexidine; local anesthetic administered 22 gauge catheter was inserted into right radial artery using the Seldinger technique.  Evaluation Blood flow poor; BP tracing poor. Complications: No apparent complications.   [name], [name] 04/12/2017

## 2017-04-12 NOTE — Anesthesia Procedure Notes (Signed)
Arterial Line Insertion Start/End11/14/2018 2:30 PM, 04/12/2017 2:30 PM Performed by: CRNA  Patient location: OR. Preanesthetic checklist: patient identified, IV checked, site marked, risks and benefits discussed, surgical consent, monitors and equipment checked, pre-op evaluation, timeout performed and anesthesia consent Left, radial was placed Catheter size: 20 G Hand hygiene performed  and maximum sterile barriers used   Attempts: 1 Procedure performed without using ultrasound guided technique. Following insertion, dressing applied and Biopatch. Post procedure assessment: normal  Patient tolerated the procedure well with no immediate complications.

## 2017-04-12 NOTE — Anesthesia Postprocedure Evaluation (Signed)
Anesthesia Post Note  Patient: [name]  Procedure(s) Performed: Resection of LV Aneurysm (Left ) PLACEMENT OF IMPELLA  LD     Patient location during evaluation: ICU Anesthesia Type: General Level of consciousness: sedated Pain management: pain level controlled Vital Signs Assessment: post-procedure vital signs reviewed and stable Respiratory status: patient remains intubated per anesthesia plan Cardiovascular status: unstable Postop Assessment: no apparent nausea or vomiting Anesthetic complications: no Comments: Patient transported to ICU with impella and chest open    Last Vitals:  Vitals:   04/12/17 2315 04/12/17 2330  BP:    Pulse: (!) 29 (!) 29  Resp: (!) 24 (!) 24  Temp: 37.2 C 37.2 C  SpO2: 100% 99%    Last Pain:  Vitals:   04/12/17 2200  TempSrc: Core  PainSc:                  [name]

## 2017-04-12 NOTE — Progress Notes (Signed)
Progress Note  Patient Name: Ronald HoveGary [name] Date of Encounter: 04/12/2017  Primary Cardiologist: New  Subjective   Intubated, sedated  Inpatient Medications    Scheduled Meds: . aspirin  81 mg Oral Daily  . atorvastatin  80 mg Oral q1800  . insulin aspart  2-6 Units Subcutaneous Q4H  . midazolam      . pantoprazole (PROTONIX) IV  40 mg Intravenous Q12H  . sodium chloride flush  3 mL Intravenous Q12H  . sodium chloride flush  3 mL Intravenous Q12H  . ticagrelor  90 mg Per Tube BID  . vecuronium       Continuous Infusions: . sodium chloride    . sodium chloride    . sodium chloride    . azithromycin Stopped (04/12/17 0415)  . cefTRIAXone (ROCEPHIN)  IV Stopped (04/12/17 [id number])  . fentaNYL infusion INTRAVENOUS 200 mcg/hr (04/12/17 0600)  . heparin 1,250 Units/hr (04/12/17 0600)  . norepinephrine (LEVOPHED) Adult infusion    . phenylephrine (NEO-SYNEPHRINE) Adult infusion 160 mcg/min (04/12/17 0800)  . propofol     PRN Meds: sodium chloride, sodium chloride, acetaminophen, fentaNYL, midazolam, midazolam, nitroGLYCERIN, ondansetron (ZOFRAN) IV, sodium chloride flush, sodium chloride flush   Vital Signs    Vitals:   04/12/17 0645 04/12/17 0700 04/12/17 0715 04/12/17 0730  BP:      Pulse: 72 71 83 75  Resp: 18 18 16 18   Temp: (!) 97.3 F (36.3 C) (!) 97 F (36.1 C) 97.7 F (36.5 C) 97.7 F (36.5 C)  TempSrc:      SpO2: 97% 97% 96% 97%  Weight:        Intake/Output Summary (Last 24 hours) at 04/12/2017 0855 Last data filed at 04/12/2017 0800 Gross per 24 hour  Intake 1318.39 ml  Output 2070 ml  Net -751.61 ml   Filed Weights   04/11/17 2132 04/12/17 0030 04/12/17 0615  Weight: 160 lb (72.6 kg) 215 lb 6.2 oz (97.7 kg) 200 lb 6.4 oz (90.9 kg)    Physical Exam   GEN: sedated, calm  Neck: JVD Cardiac: RRR, no murmurs, rubs, or gallops.  Respiratory: intubated, course breath sounds  GI: Soft, nontender, non-distended  MS: No edema; No deformity. Neuro:   Nonfocal   Labs    Chemistry Recent Labs  Lab 04/11/17 2138 04/12/17 0057 04/12/17 0605  NA 138 140 140  K 4.0 4.3 4.2  CL 107 109 107  CO2 19* 24 24  GLUCOSE 249* 150* 119*  BUN 19 20 19   CREATININE 1.67* 1.52* 1.42*  CALCIUM 8.4* 8.1* 7.8*  PROT 6.9 6.3*  --   ALBUMIN 3.4* 3.0*  --   AST 32 39  --   ALT 20 27  --   ALKPHOS 86 75  --   BILITOT 0.5 0.7  --   GFRNONAA 40* 45* 49*  GFRAA 47* 53* 57*  ANIONGAP 12 7 9      Hematology Recent Labs  Lab 04/11/17 2138 04/12/17 0057 04/12/17 0605  WBC 13.9* 19.6* 12.6*  RBC 5.02 4.61 4.29  HGB 14.5 13.5 12.1*  HCT 46.7 42.5 38.5*  MCV 93.0 92.2 89.7  MCH 28.9 29.3 28.2  MCHC 31.0 31.8 31.4  RDW 14.3 14.5 14.2  PLT 235 194 202    Cardiac Enzymes Recent Labs  Lab 04/11/17 2138 04/12/17 0057 04/12/17 0605  TROPONINI 0.09* 0.11* 0.18*   No results for input(s): TROPIPOC in the last 168 hours.   BNPNo results for input(s): BNP, PROBNP in the last 168  hours.   DDimer No results for input(s): DDIMER in the last 168 hours.   Radiology    Dg Chest Port 1 View  Result Date: 04/12/2017 CLINICAL DATA:  66 year old male with aortic balloon pump placement. EXAM: PORTABLE CHEST 1 VIEW COMPARISON:  Earlier radiograph dated 04/11/2017 FINDINGS: There has been interval placement of an intra-aortic balloon pump. The radiopaque marker of the pop is located in the proximal descending aorta at the level of the carina. An endotracheal tube is seen with tip approximately 2 cm above the carina. Enteric tube extends into the left hemiabdomen with tip beyond the inferior margin of the image. Bilateral confluent airspace opacities again noted. No large pleural effusion. No pneumothorax. Cardiomegaly. Degenerative changes of the shoulders. No acute osseous pathology. IMPRESSION: 1. Interval placement of an intra-aortic balloon pop with radiopaque marker over the proximal descending thoracic aorta. 2. Endotracheal tube 2 cm above the carina.  3. Bilateral patchy airspace opacities. Electronically Signed   By: Elgie CollardArash  Radparvar M.D.   On: 04/12/2017 01:49   Dg Chest Portable 1 View  Result Date: 04/11/2017 CLINICAL DATA:  Endotracheal tube placement. EXAM: PORTABLE CHEST 1 VIEW COMPARISON:  Chest radiograph performed 05/11/2010 FINDINGS: The patient's endotracheal tube is seen ending 3-4 cm above the carina. An enteric tube is noted extending below the diaphragm. Diffuse bilateral airspace opacification is compatible with pneumonia, though superimposed pulmonary edema cannot be excluded. Vascular congestion is noted. No pleural effusion or pneumothorax is seen. The cardiomediastinal silhouette is mildly enlarged. No acute osseous abnormalities are seen. IMPRESSION: 1. Endotracheal tube seen ending 3-4 cm above the carina. 2. Diffuse bilateral airspace opacification, compatible with pneumonia. Superimposed pulmonary edema cannot be excluded. 3. Vascular congestion and mild cardiomegaly. Electronically Signed   By: Roanna RaiderJeffery  Chang M.D.   On: 04/11/2017 22:25    Cardiac Studies   Cardiac catheterization 04/11/2017  Mid LAD-2 lesion is 100% stenosed.  Balloon angioplasty was performed using a BALLOON SAPPHIRE 2.5X12. Post intervention, there is a 25% residual stenosis with significant no reflow in the apical LAD.  Dist RCA lesion is 95% stenosed. A drug-eluting stent was successfully placed using a STENT SYNERGY DES 3.5X28.  Post intervention, there is a 0% residual stenosis.  LV end diastolic pressure is severely elevated.  There is no aortic valve stenosis.  Ost 1st Diag lesion is 50% stenosed.  Proximal ramus is 70% stenosed.  IABP placed due to high LVEDP.   Continue IV Angiomax for the current bag.  He will need dual antiplatelet therapy for ideally 1 year.  He will need echocardiogram to evaluate left ventricular function.  I suspect his LV function is severely decreased based on his LVEDP of 44 mmHg.  Continue aggressive  secondary prevention, including smoking cessation.    [facility].    IV Lasix ordered.  Patient Profile     66 y.o. male with history of myocardial infarction, hypertension, COPD, prior tobacco use. Presented via EMS 11/13 with chest pain and hematemesis found to have inferior lateral STEMI and was taken for emergent coronary angiography. Cath revealed occlusion of the mid LAD with an ulcerative lesion in the distal RCA. The distal RCA was stented, flow to the LAD could not be reestablished with thrombectomy and angioplasty, an intra aortic balloon pump was placed.   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 On dopamine, phenylephrine, norepinephrine and epinephrine for cardiogenic shock. Will initiate medication management for the ischemic disease as he stabilizes.   Acute hypoxic respiratory failure 2/2 cardiogenic shock - intubated prior to angiography, appreciate PCCM team care   For questions or updates, please contact [facility] Please consult [email or web address] for contact info under Cardiology/STEMI.      Signed, Eulah Pont, MD  04/12/2017, 8:55 AM

## 2017-04-12 NOTE — Progress Notes (Signed)
  Echocardiogram Echocardiogram Transesophageal has been performed.  Ronald SkeenVijay  [name] 04/12/2017, 3:22 PM

## 2017-04-12 NOTE — Transfer of Care (Signed)
Immediate Anesthesia Transfer of Care Note  Patient: Arlana HoveGary Zoll  Procedure(s) Performed: Resection of LV Aneurysm (Left ) PLACEMENT OF IMPELLA  LD  Patient Location: SICU  Anesthesia Type:General  Level of Consciousness: sedated and Patient remains intubated per anesthesia plan  Airway & Oxygen Therapy: Patient remains intubated per anesthesia plan  Post-op Assessment: Report given to RN and Post -op Vital signs reviewed and stable  Post vital signs: Reviewed and stable  Last Vitals:  Vitals:   04/12/17 1345 04/12/17 1400  BP:  (!) 154/79  Pulse: 83 99  Resp:    Temp: 37.5 C 37.4 C  SpO2: 95% 99%    Last Pain:  Vitals:   04/12/17 1200  TempSrc: Esophageal  PainSc:          Complications: No apparent anesthesia complications   Transported to unit on ventilator.  Placed on vent in OR prior to leaving.   Tolerated transport well.  VS stable upon arrival to SICU.  Dr. Tyrone SageGerhardt at bedside.  Pulse ox monitor in-and-out after trying several probes.  Currently 96%.

## 2017-04-12 NOTE — Progress Notes (Signed)
PULMONARY / CRITICAL CARE MEDICINE   Name: [name] MRN: [id number] DOB: [birth year]    ADMISSION DATE:  04/11/2017 CONSULTATION DATE:  04/11/17  REFERRING MD:  Eldridge Dace  CHIEF COMPLAINT:  STEMI  HISTORY OF PRESENT ILLNESS:  [name] is a 66 y.o. male with PMH of CAD and HTN.  He presented to [facility] ED 11/13 with sudden onset of SOB and chest pain.  He was found to have inferolateral STEMI and was taken urgently to cath lab.  Prior to cath lab, he was intubated; therefore, PCCM asked to see in consultation to assist with vent management.  In cath lab, he was found to have 100% stenosed mid LAD lesion that was balloon angioplastied with 25% residual stenosis, distal RCA lesion which had DES placed, Ost 1st diag lesion 50% stenosed, proximal ramus 70% stenosed.  IABP was also placed. Has had ongoing shock, pressor dependent.    SUBJECTIVE:  Hypotensive overnight.  On levophed .  On IABP 1:1. Good UOP. Co-ox 70  VITAL SIGNS: BP (!) 48/39   Pulse 94   Temp 97.9 F (36.6 C)   Resp 18   Wt 90.9 kg (200 lb 6.4 oz)   SpO2 97%   HEMODYNAMICS:    VENTILATOR SETTINGS: Vent Mode: PRVC FiO2 (%):  [50 %-100 %] 50 % Set Rate:  [14 bmp-18 bmp] 18 bmp Vt Set:  [580 mL] 580 mL PEEP:  [5 cmH20] 5 cmH20 Plateau Pressure:  [25 cmH20-29 cmH20] 26 cmH20  INTAKE / OUTPUT: I/O last 3 completed shifts: In: 977.6 [I.V.:897.6; NG/GT:30; IV Piggyback:50] Out: 1970 [Urine:1370; Emesis/NG output:600]   PHYSICAL EXAMINATION: General: Adult male, acutely ill appearing, NAD  Neuro: sedated, RASS -1, opens eyes to voice  HEENT: [place]/AT. PERRL, sclerae anicteric. Cardiovascular: RRR, no M/R/G.  Lungs: Respirations even and unlabored.  CTA bilaterally, No W/R/R. Abdomen: BS x 4, soft, NT/ND.  Musculoskeletal: No gross deformities, no edema.  Skin: Pale, warm, no rashes.   LABS:  BMET Recent Labs  Lab 04/11/17 2138 04/12/17 0057 04/12/17 0605  NA 138 140 140  K 4.0 4.3 4.2  CL 107  109 107  CO2 19* 24 24  BUN 19 20 19   CREATININE 1.67* 1.52* 1.42*  GLUCOSE 249* 150* 119*    Electrolytes Recent Labs  Lab 04/11/17 2138 04/12/17 0057 04/12/17 0605  CALCIUM 8.4* 8.1* 7.8*    CBC Recent Labs  Lab 04/11/17 2138 04/12/17 0057 04/12/17 0605  WBC 13.9* 19.6* 12.6*  HGB 14.5 13.5 12.1*  HCT 46.7 42.5 38.5*  PLT 235 194 202    Coag's Recent Labs  Lab 04/11/17 2138  APTT 30  INR 1.28    Sepsis Markers No results for input(s): LATICACIDVEN, PROCALCITON, O2SATVEN in the last 168 hours.  ABG Recent Labs  Lab 04/12/17 0225 04/12/17 0605  PHART 7.229* 7.402  PCO2ART 66.4* 42.9  PO2ART 222.0* 76.0*    Liver Enzymes Recent Labs  Lab 04/11/17 2138 04/12/17 0057  AST 32 39  ALT 20 27  ALKPHOS 86 75  BILITOT 0.5 0.7  ALBUMIN 3.4* 3.0*    Cardiac Enzymes Recent Labs  Lab 04/11/17 2138 04/12/17 0057 04/12/17 0605  TROPONINI 0.09* 0.11* 0.18*    Glucose Recent Labs  Lab 04/12/17 0515 04/12/17 0827  GLUCAP 140* 87    Imaging Dg Chest Port 1 View  Result Date: 04/12/2017 CLINICAL DATA:  Hypoxia EXAM: PORTABLE CHEST 1 VIEW COMPARISON:  April 12, 2017 FINDINGS: Endotracheal tube tip is 3.6 cm above the carina.  Nasogastric tube tip and side port are below the diaphragm. Intra-aortic balloon pump tip is in the proximal descending thoracic aortic region, stable. Central catheter tip is in the superior vena cava. An apparent monitor leads either in or overlying the mid esophagus. No pneumothorax. There is patchy infiltrate in the right upper lobe, less than on study obtained earlier in the day. There is perihilar interstitial edema. There is cardiomegaly with small left pleural effusion. There is mild pulmonary venous hypertension. No bone lesions. No adenopathy. IMPRESSION: Tube and catheter positions as described without pneumothorax. Less airspace opacity right upper lobe compared to earlier in the day. Persistent perihilar edema remains  with small left pleural effusion. Stable cardiomegaly. Electronically Signed   By: Bretta BangWilliam  Woodruff III M.D.   On: 04/12/2017 09:21   Dg Chest Port 1 View  Result Date: 04/12/2017 CLINICAL DATA:  66 year old male with aortic balloon pump placement. EXAM: PORTABLE CHEST 1 VIEW COMPARISON:  Earlier radiograph dated 04/11/2017 FINDINGS: There has been interval placement of an intra-aortic balloon pump. The radiopaque marker of the pop is located in the proximal descending aorta at the level of the carina. An endotracheal tube is seen with tip approximately 2 cm above the carina. Enteric tube extends into the left hemiabdomen with tip beyond the inferior margin of the image. Bilateral confluent airspace opacities again noted. No large pleural effusion. No pneumothorax. Cardiomegaly. Degenerative changes of the shoulders. No acute osseous pathology. IMPRESSION: 1. Interval placement of an intra-aortic balloon pop with radiopaque marker over the proximal descending thoracic aorta. 2. Endotracheal tube 2 cm above the carina. 3. Bilateral patchy airspace opacities. Electronically Signed   By: Elgie CollardArash  Radparvar M.D.   On: 04/12/2017 01:49   Dg Chest Portable 1 View  Result Date: 04/11/2017 CLINICAL DATA:  Endotracheal tube placement. EXAM: PORTABLE CHEST 1 VIEW COMPARISON:  Chest radiograph performed 05/11/2010 FINDINGS: The patient's endotracheal tube is seen ending 3-4 cm above the carina. An enteric tube is noted extending below the diaphragm. Diffuse bilateral airspace opacification is compatible with pneumonia, though superimposed pulmonary edema cannot be excluded. Vascular congestion is noted. No pleural effusion or pneumothorax is seen. The cardiomediastinal silhouette is mildly enlarged. No acute osseous abnormalities are seen. IMPRESSION: 1. Endotracheal tube seen ending 3-4 cm above the carina. 2. Diffuse bilateral airspace opacification, compatible with pneumonia. Superimposed pulmonary edema cannot be  excluded. 3. Vascular congestion and mild cardiomegaly. Electronically Signed   By: Roanna RaiderJeffery  Chang M.D.   On: 04/11/2017 22:25     STUDIES:  CXR 11/13 > possible PNA. Echo 11/14 >   CULTURES: Blood 11/13 >  Sputum 11/13 >   ANTIBIOTICS: Ceftriaxone 11/13 >  Azithromycin 11/13 >   SIGNIFICANT EVENTS: 11/13 > admit.  LINES/TUBES: ETT 11/13 >  R femoral sheath 11/13 >  R IJ CVL (bensimohn) 11/14>>>  DISCUSSION: 66 y.o. male admitted 11/13 with inferolateral STEMI.  He was intubated and was then taken to cath lab where he was found to have 100% stenosed mid LAD lesion that was balloon angioplastied with 25% residual stenosis, distal RCA lesion which had DES placed, Ost 1st diag lesion 50% stenosed, proximal ramus 70% stenosed.  IABP was also placed. Post cath lab, he remained ventilated, pressor dependent and PCCM was asked to assist.   ASSESSMENT / PLAN:  PULMONARY A: Acute respiratory failure - r/t STEMI ? PNA - bilateral patchiness - ?aspiration v edema  P:   Vent support - 8cc/kg  F/u CXR  F/u ABG SBT in  am if meets criteria  Continue empiric abx    CARDIOVASCULAR A:  Inferolateral STEMI - s/p cath lab where he was found to have 100% stenosed mid LAD lesion that was balloon angioplastied with 25% residual stenosis, distal RCA lesion which had DES placed, Ost 1st diag lesion 50% stenosed, proximal ramus 70% stenosed.  IABP was also placed. Hypotension - suspect sedation related (was normotensive on arrival to ICU but was somewhat agitated so got additional sedation and later became hypotensive). Hx HTN, CAD. P:  Cardiology following / managing. Wean levophed as able  Follow CVPs  Co-ox 70 Echo pending  ASA, brilinta, statin  Heparin gtt per pharmacy for ACS   RENAL A:   NAGMA. AKI - improving.  Good UOP P:   Gentle volume for now  BMP in AM. Replace electrolytes as needed   GASTROINTESTINAL A:   GI prophylaxis. Nutrition. P:   SUP:  Pantoprazole. Add TF 11/14  HEMATOLOGIC A:   VTE Prophylaxis. P:  Heparin gtt as above  CBC in AM.  INFECTIOUS A:   Possible PNA. P:   Abx as above (ceftriaxone / azithromycin).  Follow cultures Trend wbc, fever curve  Check pct   ENDOCRINE A:   No acute issues. P:   Monitor glucose on chem   NEUROLOGIC A:   Sedation due to mechanical ventilation. P:   Sedation:  Fentanyl gtt / Midazolam PRN. RASS goal: 0 to -1. Daily WUA.  Family updated: None available 11/14  Interdisciplinary Family Meeting v Palliative Care Meeting:  Due by: 04/17/17.  CC time: 40 min.   Dirk DressKaty Whiteheart, NP 04/12/2017  10:38 AM Pager: [phone] or [phone]) [phone]  STAFF NOTE: I, Rory Percyaniel Feinstein, MD FACP have personally reviewed patient's available data, including medical history, events of note, physical examination and test results as part of my evaluation. I have discussed with resident/NP and other care providers such as pharmacist, RN and RRT. In addition, I personally evaluated patient and elicited key findings of: rass -1,FC,calm on vent, JVD up, line clean just placed, coarse crackles, abdo soft, no r/g, no rash,min edema, pcxr which I reviewed shows int edema c/w pulm edema, ETT wnl, ABG last reviewed would keep him on same MV,remain ON 50%, peep 5, NO SBT with balloon support, echo prelim with Mitral valve pap rupture unofficial, MAP goal 65, levophed now 14, neo off, agree use levophed, fent to rass goal -1, may need deeper, for CVTS consult, agree lasix to even to neg balance, although now O2 needs are low and last pcxr slight improved edema, I am not impressed for infection, consider dc zosyn, glu up and down,SSI , no long acting, NO feeding, sisteer here on rounds and particpiated Assess lactic, svo2 for perfsuion status The patient is critically ill with multiple organ systems failure and requires high complexity decision making for assessment and support, frequent evaluation and  titration of therapies, application of advanced monitoring technologies and extensive interpretation of multiple databases.   Critical Care Time devoted to patient care services described in this note is 50 Minutes. This time reflects time of care of this signee: Rory Percyaniel Feinstein, MD FACP. This critical care time does not reflect procedure time, or teaching time or supervisory time of PA/NP/Med student/Med Resident etc but could involve care discussion time. Rest per NP/medical resident whose note is outlined above and that I agree with   Mcarthur Rossettianiel J. Tyson AliasFeinstein, MD, FACP Pgr: [phone] [facility] 04/12/2017 11:11 AM

## 2017-04-12 NOTE — Progress Notes (Signed)
eLink Physician-Brief Progress Note Patient Name: Ronald HoveGary [name] DOB: 1950/12/15 MRN: [id number]   Date of Service  04/12/2017  HPI/Events of Note  ABG on 100%/PRVC 14/TV 580/P5 = 7.23/66/222/27.  eICU Interventions  Will order:] 1. Increase PRVC rate to 18. 2. ABG at 3:45 AM.     Intervention Category Major Interventions: Acid-Base disturbance - evaluation and management;Respiratory failure - evaluation and management  Vinnie Gombert Dennard Nipugene 04/12/2017, 2:31 AM

## 2017-04-12 NOTE — Progress Notes (Signed)
     Dr Gala RomneyBensimhon and Dr Shirlee LatchMcLean reviewed ECHO.   Contained rupture in LV myocardium towards the Apex.   Currently on 12 mcg norepi. Set up CVP.   CT surgery consulted stat.Stop bival. Stat Type and Cross.   Amy Clegg NP-C  11:09 AM   Stat echo reviewed personally and discussed with Dr. Shirlee LatchMclean which showed contained ruptured of LV. Dr. Shirlee LatchMclean then performed TEE which confirmed TTE findings.   TCTS consulted emergently. Discussed with PharmD who stopped Bival and held [place].  Patient requiring increased doses of norepi in addition to IABP support.  I discussed with family at bedside and with Dr. Tyrone SageGerhardt. Taken emergently to OR.   Additional CCT 45 minutes.  [name], Chailyn Racette, MD  9:08 PM

## 2017-04-12 NOTE — Progress Notes (Signed)
Patient transported from ED to Cath Lab without incident.

## 2017-04-12 NOTE — Progress Notes (Addendum)
Pt with hypotensive episode unrelated to sedation. Cards and Elink MD made aware of increase in pressor requirement as well as current vital signs. Radial a-line and cuff pressure unreliable, for consistent BP readings please refer to IABP charting. No orders received. Will continue to monitor closely. Modena JanskyKevin Dent Plantz RN 2 Heart

## 2017-04-12 NOTE — Progress Notes (Signed)
Arterial Blood Gas panic value called to Dr. Dellie CatholicSommers.  New vent orders given and made.  Will obtain a repeat ABG in 1 hour per MD order.

## 2017-04-12 NOTE — Progress Notes (Signed)
eLink Physician-Brief Progress Note Patient Name: Ronald HoveGary [name] DOB: 04-Mar-1951 MRN: [id number]   Date of Service  04/12/2017  HPI/Events of Note  Patient is intubated and ventilated -  Request for A-line placement.   eICU Interventions  Will order A-line placed.      Intervention Category Major Interventions: Hypotension - evaluation and management  [name] 04/12/2017, 4:42 AM

## 2017-04-12 NOTE — Consult Note (Signed)
    [address]       [place] [address]             [phone]        Judea Ritter [facility] Medical Record #[id number] Date of Birth: 09/25/1950  Referring: No ref. provider found Primary Care: No primary care provider on file.  Chief Complaint:    Chief Complaint  Patient presents with  . Code STEMI    History of Present Illness:     Asked to see the patient urgently.  Patient is a 66-year-old who approximately 1 week ago called his family noting that he felt bad and thought he was going to die.  Over the past week initially he felt somewhat better, but because of increasing shortness of breath he was brought by EMS to the emergency room yesterday.  He was seen by cardiology and taken directly to the Cath Lab-code STEMI.  Intra-aortic balloon pump was placed patient was intubated as he was inpulmonary edema and respiratory distress.  Attempts at opening the LAD distally were unsuccessful, patient had minor degree of disease in the distal right which was stented.   First echo done today suggesting contained rupture of the apex, apical anterior wall, follow-up TEE has been done   Current Activity/ Functional Status: Unknown   Zubrod Score: At the time of surgery this patient's most appropriate activity status/level should be described as: []    0    Normal activity, no symptoms []    1    Restricted in physical strenuous activity but ambulatory, able to do out light work []    2    Ambulatory and capable of self care, unable to do work activities, up and about                 more than 50%  Of the time                            []    3    Only limited self care, in bed greater than 50% of waking hours []    4    Completely disabled, no self care, confined to bed or chair []    5    Moribund  Past Medical History:  Diagnosis Date  . Coronary artery disease   . Hypertension     History reviewed. No pertinent surgical history.  Social History    Tobacco Use  Smoking Status Current Every Day Smoker  Smokeless Tobacco Never Used    Social History   Substance and Sexual Activity  Alcohol Use Not on file    Social History   Socioeconomic History  . Marital status: Divorced    Spouse name: Not on file  . Number of children: Not on file  . Years of education: Not on file  . Highest education level: Not on file  Social Needs  . Financial resource strain: Not on file  . Food insecurity - worry: Not on file  . Food insecurity - inability: Not on file  . Transportation needs - medical: Not on file  . Transportation needs - non-medical: Not on file  Occupational History  . Not on file  Tobacco Use  . Smoking status: Current Every Day Smoker  . Smokeless tobacco: Never Used  Substance and Sexual Activity  . Alcohol use: Not on file  . Drug use: Not on file  .   Sexual activity: Not on file  Other Topics Concern  . Not on file  Social History Narrative  . Not on file    No Known Allergies  Current Facility-Administered Medications  Medication Dose Route Frequency Provider Last Rate Last Dose  . 0.9 %  sodium chloride infusion  250 mL Intravenous PRN Vedre, Ameeth, MD      . 0.9 %  sodium chloride infusion  250 mL Intravenous PRN Varanasi, Jayadeep S, MD      . 0.9 %  sodium chloride infusion   Intravenous Continuous Varanasi, Jayadeep S, MD 10 mL/hr at 04/12/17 0940 10 mL/hr at 04/12/17 0940  . 0.9 %  sodium chloride infusion   Intravenous Once Clegg, Amy D, NP      . acetaminophen (TYLENOL) tablet 650 mg  650 mg Oral Q4H PRN Varanasi, Jayadeep S, MD      . aspirin chewable tablet 81 mg  81 mg Oral Daily Varanasi, Jayadeep S, MD   81 mg at 04/12/17 1000  . atorvastatin (LIPITOR) tablet 80 mg  80 mg Oral q1800 Varanasi, Jayadeep S, MD      . azithromycin (ZITHROMAX) 500 mg in dextrose 5 % 250 mL IVPB  500 mg Intravenous QHS Varanasi, Jayadeep S, MD   Stopped at 04/12/17 0415  . cefTRIAXone (ROCEPHIN) 1 g in dextrose 5  % 50 mL IVPB  1 g Intravenous QHS Varanasi, Jayadeep S, MD   Stopped at 04/12/17 0312  . [START ON 04/13/2017] cefUROXime (ZINACEF) 1.5 g in dextrose 5 % 50 mL IVPB  1.5 g Intravenous To OR Varanasi, Jayadeep S, MD      . [START ON 04/13/2017] cefUROXime (ZINACEF) 750 mg in dextrose 5 % 50 mL IVPB  750 mg Intravenous To OR Varanasi, Jayadeep S, MD      . chlorhexidine gluconate (MEDLINE KIT) (PERIDEX) 0.12 % solution 15 mL  15 mL Mouth Rinse BID Varanasi, Jayadeep S, MD   15 mL at 04/12/17 1103  . [START ON 04/13/2017] dexmedetomidine (PRECEDEX) 400 MCG/100ML (4 mcg/mL) infusion  0.1-0.7 mcg/kg/hr Intravenous To OR Varanasi, Jayadeep S, MD      . [START ON 04/13/2017] DOPamine (INTROPIN) 800 mg in dextrose 5 % 250 mL (3.2 mg/mL) infusion  0-10 mcg/kg/min Intravenous To OR Varanasi, Jayadeep S, MD      . [START ON 04/13/2017] EPINEPHrine (ADRENALIN) 4 mg in dextrose 5 % 250 mL (0.016 mg/mL) infusion  0-10 mcg/min Intravenous To OR Varanasi, Jayadeep S, MD      . feeding supplement (PRO-STAT SUGAR FREE 64) liquid 30 mL  30 mL Per Tube BID Whiteheart, Kathryn A, NP   Stopped at 04/12/17 1309  . feeding supplement (VITAL HIGH PROTEIN) liquid 1,000 mL  1,000 mL Per Tube Q24H Whiteheart, Kathryn A, NP   Stopped at 04/12/17 1308  . fentaNYL (SUBLIMAZE) bolus via infusion 25 mcg  25 mcg Intravenous Q1H PRN Cardama, Pedro Eduardo, MD      . fentaNYL 2500mcg in NS 250mL (10mcg/ml) infusion-PREMIX  25-400 mcg/hr Intravenous Continuous Cardama, Pedro Eduardo, MD 20 mL/hr at 04/12/17 1017 200 mcg/hr at 04/12/17 1017  . [START ON 04/13/2017] heparin 2,500 Units, papaverine 30 mg in electrolyte-148 (PLASMALYTE-148) 500 mL irrigation   Irrigation To OR Varanasi, Jayadeep S, MD      . [START ON 04/13/2017] heparin 30,000 units/NS 1000 mL solution for CELLSAVER   Other To OR Varanasi, Jayadeep S, MD      . insulin aspart (novoLOG) injection 2-6 Units  2-6   Units Subcutaneous Q4H Scatliffe, Kristen D, MD   2 Units at  04/12/17 0537  . [START ON 04/13/2017] insulin regular (NOVOLIN R,HUMULIN R) 100 Units in sodium chloride 0.9 % 100 mL (1 Units/mL) infusion   Intravenous To OR Varanasi, Jayadeep S, MD      . [START ON 04/13/2017] magnesium sulfate (IV Push/IM) injection 40 mEq  40 mEq Other To OR Varanasi, Jayadeep S, MD      . MEDLINE mouth rinse  15 mL Mouth Rinse QID Varanasi, Jayadeep S, MD   15 mL at 04/12/17 1200  . midazolam (VERSED) injection 1 mg  1 mg Intravenous Q15 min PRN Cardama, Pedro Eduardo, MD   1 mg at 04/12/17 0023  . midazolam (VERSED) injection 1 mg  1 mg Intravenous Q2H PRN Cardama, Pedro Eduardo, MD   1 mg at 04/12/17 0449  . nitroGLYCERIN (NITROSTAT) SL tablet 0.4 mg  0.4 mg Sublingual Q5 Min x 3 PRN Vedre, Ameeth, MD      . [START ON 04/13/2017] nitroGLYCERIN 50 mg in dextrose 5 % 250 mL (0.2 mg/mL) infusion  2-200 mcg/min Intravenous To OR Varanasi, Jayadeep S, MD      . norepinephrine (LEVOPHED) 4 mg in dextrose 5 % 250 mL (0.016 mg/mL) infusion  0-40 mcg/min Intravenous Titrated Varanasi, Jayadeep S, MD 60 mL/hr at 04/12/17 1000 16 mcg/min at 04/12/17 1000  . ondansetron (ZOFRAN) injection 4 mg  4 mg Intravenous Q6H PRN Vedre, Ameeth, MD      . pantoprazole (PROTONIX) injection 40 mg  40 mg Intravenous Q12H Scatliffe, Kristen D, MD   40 mg at 04/12/17 1057  . [START ON 04/13/2017] phenylephrine (NEO-SYNEPHRINE) 20 mg in sodium chloride 0.9 % 250 mL (0.08 mg/mL) infusion  30-200 mcg/min Intravenous To OR Varanasi, Jayadeep S, MD      . [START ON 04/13/2017] potassium chloride injection 80 mEq  80 mEq Other To OR Varanasi, Jayadeep S, MD      . sodium chloride 0.9 % bolus 250 mL  250 mL Intravenous Once Clegg, Amy D, NP 250 mL/hr at 04/12/17 1248 250 mL at 04/12/17 1248  . sodium chloride flush (NS) 0.9 % injection 3 mL  3 mL Intravenous Q12H Vedre, Ameeth, MD   3 mL at 04/12/17 0038  . sodium chloride flush (NS) 0.9 % injection 3 mL  3 mL Intravenous PRN Vedre, Ameeth, MD      . sodium  chloride flush (NS) 0.9 % injection 3 mL  3 mL Intravenous Q12H Varanasi, Jayadeep S, MD   3 mL at 04/12/17 0036  . sodium chloride flush (NS) 0.9 % injection 3 mL  3 mL Intravenous PRN Varanasi, Jayadeep S, MD      . [START ON 04/13/2017] tranexamic acid (CYKLOKAPRON) 2,500 mg in sodium chloride 0.9 % 250 mL (10 mg/mL) infusion  1.5 mg/kg/hr Intravenous To OR Varanasi, Jayadeep S, MD      . [START ON 04/13/2017] tranexamic acid (CYKLOKAPRON) bolus via infusion - over 30 minutes 1,363.5 mg  15 mg/kg Intravenous To OR Varanasi, Jayadeep S, MD      . [START ON 04/13/2017] tranexamic acid (CYKLOKAPRON) pump prime solution 182 mg  2 mg/kg Intracatheter To OR Varanasi, Jayadeep S, MD      . [START ON 04/13/2017] vancomycin (VANCOCIN) 1,500 mg in sodium chloride 0.9 % 250 mL IVPB  1,500 mg Intravenous To OR Varanasi, Jayadeep S, MD        No medications prior to admission.    History   reviewed. No pertinent family history.   Review of Systems:  Pertinent items are noted in HPI.     Cardiac Review of Systems: Y or N  Chest Pain [    ]  Resting SOB [   ] Exertional SOB  [  ]  Orthopnea [  ]   Pedal Edema [   ]    Palpitations [  ] Syncope  [  ]   Presyncope [   ]  General Review of Systems: [Y] = yes [  ]=no Constitional: recent weight change [  ]; anorexia [  ]; fatigue [  ]; nausea [  ]; night sweats [  ]; fever [  ]; or chills [  ]                                                               Dental: poor dentition[  ]; Last Dentist visit:   Eye : blurred vision [  ]; diplopia [   ]; vision changes [  ];  Amaurosis fugax[  ]; Resp: cough [  ];  wheezing[  ];  hemoptysis[  ]; shortness of breath[  ]; paroxysmal nocturnal dyspnea[  ]; dyspnea on exertion[  ]; or orthopnea[  ];  GI:  gallstones[  ], vomiting[  ];  dysphagia[  ]; melena[  ];  hematochezia [  ]; heartburn[  ];   Hx of  Colonoscopy[  ]; GU: kidney stones [  ]; hematuria[  ];   dysuria [  ];  nocturia[  ];  history of     obstruction [   ]; urinary frequency [  ]             Skin: rash, swelling[  ];, hair loss[  ];  peripheral edema[  ];  or itching[  ]; Musculosketetal: myalgias[  ];  joint swelling[  ];  joint erythema[  ];  joint pain[  ];  back pain[  ];  Heme/Lymph: bruising[  ];  bleeding[  ];  anemia[  ];  Neuro: TIA[  ];  headaches[  ];  stroke[  ];  vertigo[  ];  seizures[  ];   paresthesias[  ];  difficulty walking[  ];  Psych:depression[  ]; anxiety[  ];  Endocrine: diabetes[  ];  thyroid dysfunction[  ];  Immunizations: Flu [  ]; Pneumococcal[  ];  Other:  Physical Exam: BP 116/72   Pulse 83   Temp 99.1 F (37.3 C)   Resp 18   Wt 200 lb 6.4 oz (90.9 kg)   SpO2 96%    General appearance: Patient sedated on ventilator but does follow commands and opens eyes Head: Normocephalic, without obvious abnormality, atraumatic Neck: no adenopathy, no carotid bruit, no JVD, supple, symmetrical, trachea midline and thyroid not enlarged, symmetric, no tenderness/mass/nodules Lymph nodes: Cervical, supraclavicular, and axillary nodes normal. Resp: diminished breath sounds bilaterally Back: symmetric, no curvature. ROM normal. No CVA tenderness. Cardio: regular rate and rhythm, S1, S2 normal, no murmur, click, rub or gallop GI: soft, non-tender; bowel sounds normal; no masses,  no organomegaly Extremities: edema Bilateral lower extremity edema Neurologic: Grossly normal Arterial line is in the right radial, intra-aortic balloon pump and right femoral artery Diagnostic Studies & Laboratory data:     Recent  Radiology Findings:   Dg Chest Port 1 View  Result Date: 04/12/2017 CLINICAL DATA:  Hypoxia EXAM: PORTABLE CHEST 1 VIEW COMPARISON:  April 12, 2017 FINDINGS: Endotracheal tube tip is 3.6 cm above the carina. Nasogastric tube tip and side port are below the diaphragm. Intra-aortic balloon pump tip is in the proximal descending thoracic aortic region, stable. Central catheter tip is in the superior vena cava. An  apparent monitor leads either in or overlying the mid esophagus. No pneumothorax. There is patchy infiltrate in the right upper lobe, less than on study obtained earlier in the day. There is perihilar interstitial edema. There is cardiomegaly with small left pleural effusion. There is mild pulmonary venous hypertension. No bone lesions. No adenopathy. IMPRESSION: Tube and catheter positions as described without pneumothorax. Less airspace opacity right upper lobe compared to earlier in the day. Persistent perihilar edema remains with small left pleural effusion. Stable cardiomegaly. Electronically Signed   By: Lowella Grip III M.D.   On: 04/12/2017 09:21   Dg Chest Port 1 View  Result Date: 04/12/2017 CLINICAL DATA:  66 year old male with aortic balloon pump placement. EXAM: PORTABLE CHEST 1 VIEW COMPARISON:  Earlier radiograph dated 04/11/2017 FINDINGS: There has been interval placement of an intra-aortic balloon pump. The radiopaque marker of the pop is located in the proximal descending aorta at the level of the carina. An endotracheal tube is seen with tip approximately 2 cm above the carina. Enteric tube extends into the left hemiabdomen with tip beyond the inferior margin of the image. Bilateral confluent airspace opacities again noted. No large pleural effusion. No pneumothorax. Cardiomegaly. Degenerative changes of the shoulders. No acute osseous pathology. IMPRESSION: 1. Interval placement of an intra-aortic balloon pop with radiopaque marker over the proximal descending thoracic aorta. 2. Endotracheal tube 2 cm above the carina. 3. Bilateral patchy airspace opacities. Electronically Signed   By: Anner Crete M.D.   On: 04/12/2017 01:49   Dg Chest Portable 1 View  Result Date: 04/11/2017 CLINICAL DATA:  Endotracheal tube placement. EXAM: PORTABLE CHEST 1 VIEW COMPARISON:  Chest radiograph performed 05/11/2010 FINDINGS: The patient's endotracheal tube is seen ending 3-4 cm above the  carina. An enteric tube is noted extending below the diaphragm. Diffuse bilateral airspace opacification is compatible with pneumonia, though superimposed pulmonary edema cannot be excluded. Vascular congestion is noted. No pleural effusion or pneumothorax is seen. The cardiomediastinal silhouette is mildly enlarged. No acute osseous abnormalities are seen. IMPRESSION: 1. Endotracheal tube seen ending 3-4 cm above the carina. 2. Diffuse bilateral airspace opacification, compatible with pneumonia. Superimposed pulmonary edema cannot be excluded. 3. Vascular congestion and mild cardiomegaly. Electronically Signed   By: Garald Balding M.D.   On: 04/11/2017 22:25     I have independently reviewed the above radiologic studies.  Recent Lab Findings: Lab Results  Component Value Date   WBC 12.6 (H) 04/12/2017   HGB 12.1 (L) 04/12/2017   HCT 38.5 (L) 04/12/2017   PLT 202 04/12/2017   GLUCOSE 119 (H) 04/12/2017   CHOL 167 04/11/2017   TRIG 109 04/11/2017   HDL 27 (L) 04/11/2017   LDLCALC 118 (H) 04/11/2017   ALT 27 04/12/2017   AST 39 04/12/2017   NA 140 04/12/2017   K 4.2 04/12/2017   CL 107 04/12/2017   CREATININE 1.42 (H) 04/12/2017   BUN 19 04/12/2017   CO2 24 04/12/2017   INR 1.28 04/11/2017   Chronic Kidney Disease   Stage I     GFR >90  Stage  II    GFR 60-89  Stage IIIA GFR 45-59  Stage IIIB GFR 30-44  Stage IV   GFR 15-29  Stage V    GFR  <15  Lab Results  Component Value Date   CREATININE 1.42 (H) 04/12/2017   CrCl cannot be calculated (Unknown ideal weight.).    Assessment / Plan:    Significant anterior apical myocardial infarction probably 7 days old with delayed contained rupture of the ventricle Chronic renal insufficiency Long-term smoking history Patient studies reviewed with cardiology, although the patient is not currently tamponaded with post infarct clot that appears to be impending.  I discussed in detail with the patient's family the dire nature of the  current situation.  With the patient on the ventilator we may attempt to explain to him the current situation and he appears to understand.   The goals risks and alternatives of the planned surgical procedure Procedure(s): Coronary/Graft Acute MI Revascularization (N/A) LEFT HEART CATH AND CORONARY ANGIOGRAPHY (N/A) Ultrasound Guidance For Vascular Access CORONARY STENT INTERVENTION (N/A) IABP Insertion (N/A) Coronary Thrombectomy (N/A)  have been discussed with the patient in detail. The risks of the procedure including death, infection, stroke, myocardial infarction, bleeding, blood transfusion have all been discussed specifically.  I have quoted Kyrin Ferdig a 50% of perioperative mortality and a complication rate as high as 80 %. The patient's questions have been answered.Charli Amiri is willing  to proceed with the planned procedure.  B  MD      [address] [place] [address] Office [phone]   Beeper [phone]  04/12/2017 1:35 PM       

## 2017-04-12 NOTE — Care Management Note (Signed)
Case Management Note Donn PieriniKristi Bettie Capistran RN, BSN Unit 4E-Case Manager-- 2H coverage [phone]  Patient Details  Name: Ronald HoveGary [name] MRN: [id number] Date of Birth: May 09, 1951  Subjective/Objective:    Pt admitted with STEMI- taken urgently to cath lab- intubated- s/p stent- post cath pressor dependent and has IABP                Action/Plan: PTA  Pt lived at home- CM to follow   Expected Discharge Date:                  Expected Discharge Plan:  Home/Self Care  In-House Referral:     Discharge planning Services  CM Consult  Post Acute Care Choice:    Choice offered to:     DME Arranged:    DME Agency:     HH Arranged:    HH Agency:     Status of Service:  In process, will continue to follow  If discussed at Long Length of Stay Meetings, dates discussed:    Discharge Disposition:   Additional Comments:  Darrold SpanWebster, Kaien Pezzullo Hall, RN 04/12/2017, 11:09 AM

## 2017-04-12 NOTE — Brief Op Note (Addendum)
04/11/2017 - 04/12/2017  10:02 PM  PATIENT:  [name]  66 y.o. male  PRE-OPERATIVE DIAGNOSIS: Recent STEMI, cardiogenic shock, LV pseudoaneurysm  POST-OPERATIVE DIAGNOSIS: Same without evidence of frank rupture Sternum remains open  PROCEDURE:  Procedure(s): Resection of LV aneurysm IMPELLA 5.0 PLACEMENT  SURGEON:  Surgeon(s) and Role:    * Delight OvensGerhardt, Nahia Nissan B, MD - Primary    * Donata ClayVan Trigt, Theron AristaPeter, MD - Assisting  PHYSICIAN ASSISTANT: WAYNE GOLD PA-C  ANESTHESIA:   general  EBL:  1800  BLOOD ADMINISTERED: Multiple units of platelets and fresh frozen, factor VII for diffuse coagulopathy  DRAINS: 2 mediastinal tubes     SPECIMEN:  No Specimen and Source of Specimen:  LV clot  DISPOSITION OF SPECIMEN:  N/A  COUNTS:  YES   DICTATION: .Other Dictation: Dictation Number PENDING  PLAN OF CARE: Admit to inpatient   PATIENT DISPOSITION:  ICU - intubated and hemodynamically stable.   Delay start of Pharmacological VTE agent (>24hrs) due to surgical blood loss or risk of bleeding: yes

## 2017-04-12 NOTE — Progress Notes (Signed)
      [address]       [place] [id number]             [phone]      S/p repair of LV aneurysm  Intubated, sedated  8/71, 92 SR PRVC 16/100%/5 PEEP PA 30/24 CO 5.2 CI 2.6 Impella flow 3.9/ IABP 1:3 Minimal CT output HCT= 38 K= 3.6  Critically ill but relatively stable early postop  Atif Chapple C. Dorris FetchHendrickson, MD [facility] [phone]

## 2017-04-12 NOTE — Progress Notes (Signed)
Attempted to call emergency contact number in chart as well as patients home number. Message left at home number. Will continue to monitor.  Modena JanskyKevin Kishan Wachsmuth Rn 2 Heart

## 2017-04-12 NOTE — Progress Notes (Signed)
Approximately 150cc of fentanyl wasted down sink, witnessed by Lezlie LyeLisa Frei.

## 2017-04-12 NOTE — Progress Notes (Addendum)
Procedure: TEE  Sedation: Patient was intubated.  Received 4 mg IV Versed total.   Indication: Concern for LV rupture.   Findings: Please see echo section for full report.  Normal left ventricular size with mild LV hypertrophy.  EF 20%. There appears to be a contained ventricular rupture with extensive thrombus involving the apex and peri-apical segments that appears to be restrained by the visceral pericardium.  There is a small free-flowing pericardial effusion.  The papillary muscles appear intact, there is trivial mitral regurgitation.  Mildly calcified, trileaflet aortic valve with trivial regurgitation and no stenosis.  Normal right ventricular size and systolic function.  Normal ascending aorta and root size.  Normal right and left atrial sizes.  No significant tricuspid regurgitation.   Marca AnconaDalton Haydon Dorris 04/12/2017 12:28 PM

## 2017-04-12 NOTE — Progress Notes (Signed)
RT moved back ETT tube 3 cm per MD , RT found ETT at 27cm at the lips and pulled it back to 24 at the lips.

## 2017-04-12 NOTE — Progress Notes (Signed)
ANTICOAGULATION CONSULT NOTE - Initial Consult  Pharmacy Consult for Heparin Indication: IABP  No Known Allergies  Patient Measurements: Weight: 215 lb 6.2 oz (97.7 kg) Heparin Dosing Weight: 90 kg  Vital Signs: Temp: 95.2 F (35.1 C) (11/13 2142) Temp Source: Temporal (11/13 2142) BP: 151/96 (11/14 0031) Pulse Rate: 109 (11/14 0031)  Labs: Recent Labs    04/11/17 2138  HGB 14.5  HCT 46.7  PLT 235  APTT 30  LABPROT 15.9*  INR 1.28  CREATININE 1.67*  TROPONINI 0.09*    CrCl cannot be calculated (Unknown ideal weight.).   Medical History: Past Medical History:  Diagnosis Date  . Coronary artery disease   . Hypertension     Medications:  No medications prior to admission.    Assessment: 66 y.o. male with STEMI s/p PCI and IABP placement, for heparin Goal of Therapy:  Heparin level 0.2-0.5 Monitor platelets by anticoagulation protocol: Yes   Plan:  Start heparin 1250 units/hr once bivalirudin bag has completed. Check heparin level in 8 hours.   Eddie Candlebbott, Eldean Klatt Vernon 04/12/2017,1:04 AM

## 2017-04-13 ENCOUNTER — Inpatient Hospital Stay (HOSPITAL_COMMUNITY): Payer: Medicare Other

## 2017-04-13 ENCOUNTER — Encounter (HOSPITAL_COMMUNITY): Payer: Self-pay | Admitting: Cardiothoracic Surgery

## 2017-04-13 LAB — BPAM FFP
BLOOD PRODUCT EXPIRATION DATE: 201811182359
Blood Product Expiration Date: 201811182359
Blood Product Expiration Date: 201811182359
Blood Product Expiration Date: 201811182359
ISSUE DATE / TIME: 201811141911
ISSUE DATE / TIME: 201811141911
ISSUE DATE / TIME: 201811141911
ISSUE DATE / TIME: 201811141911
UNIT TYPE AND RH: 5100
Unit Type and Rh: 5100
Unit Type and Rh: 5100
Unit Type and Rh: 9500

## 2017-04-13 LAB — POCT I-STAT 3, ART BLOOD GAS (G3+)
ACID-BASE EXCESS: 1 mmol/L (ref 0.0–2.0)
Acid-base deficit: 4 mmol/L — ABNORMAL HIGH (ref 0.0–2.0)
Bicarbonate: 25.6 mmol/L (ref 20.0–28.0)
Bicarbonate: 21.8 mmol/L (ref 20.0–28.0)
O2 Saturation: 100 %
O2 Saturation: 97 %
PCO2 ART: 41.3 mmHg (ref 32.0–48.0)
PH ART: 7.33 — AB (ref 7.350–7.450)
PO2 ART: 101 mmHg (ref 83.0–108.0)
TCO2: 27 mmol/L (ref 22–32)
TCO2: 23 mmol/L (ref 22–32)
pCO2 arterial: 39.3 mmHg (ref 32.0–48.0)
pH, Arterial: 7.422 (ref 7.350–7.450)
pO2, Arterial: 250 mmHg — ABNORMAL HIGH (ref 83.0–108.0)

## 2017-04-13 LAB — GLUCOSE, CAPILLARY
GLUCOSE-CAPILLARY: 127 mg/dL — AB (ref 65–99)
GLUCOSE-CAPILLARY: 126 mg/dL — AB (ref 65–99)
GLUCOSE-CAPILLARY: 139 mg/dL — AB (ref 65–99)
GLUCOSE-CAPILLARY: 159 mg/dL — AB (ref 65–99)
GLUCOSE-CAPILLARY: 144 mg/dL — AB (ref 65–99)
GLUCOSE-CAPILLARY: 138 mg/dL — AB (ref 65–99)
GLUCOSE-CAPILLARY: 136 mg/dL — AB (ref 65–99)
GLUCOSE-CAPILLARY: 106 mg/dL — AB (ref 65–99)
GLUCOSE-CAPILLARY: 112 mg/dL — AB (ref 65–99)
GLUCOSE-CAPILLARY: 100 mg/dL — AB (ref 65–99)
GLUCOSE-CAPILLARY: 104 mg/dL — AB (ref 65–99)
GLUCOSE-CAPILLARY: 106 mg/dL — AB (ref 65–99)
Glucose-Capillary: 125 mg/dL — ABNORMAL HIGH (ref 65–99)
Glucose-Capillary: 120 mg/dL — ABNORMAL HIGH (ref 65–99)
Glucose-Capillary: 135 mg/dL — ABNORMAL HIGH (ref 65–99)
Glucose-Capillary: 128 mg/dL — ABNORMAL HIGH (ref 65–99)
Glucose-Capillary: 141 mg/dL — ABNORMAL HIGH (ref 65–99)
Glucose-Capillary: 120 mg/dL — ABNORMAL HIGH (ref 65–99)
Glucose-Capillary: 115 mg/dL — ABNORMAL HIGH (ref 65–99)
Glucose-Capillary: 105 mg/dL — ABNORMAL HIGH (ref 65–99)
Glucose-Capillary: 102 mg/dL — ABNORMAL HIGH (ref 65–99)
Glucose-Capillary: 98 mg/dL (ref 65–99)

## 2017-04-13 LAB — BASIC METABOLIC PANEL
Anion gap: 5 (ref 5–15)
BUN: 12 mg/dL (ref 6–20)
CALCIUM: 8.9 mg/dL (ref 8.9–10.3)
CO2: 22 mmol/L (ref 22–32)
CREATININE: 1.06 mg/dL (ref 0.61–1.24)
Chloride: 109 mmol/L (ref 101–111)
GFR calc Af Amer: >60 mL/min (ref >60)
GLUCOSE: 155 mg/dL — AB (ref 65–99)
Potassium: 4.6 mmol/L (ref 3.5–5.1)
SODIUM: 136 mmol/L (ref 135–145)

## 2017-04-13 LAB — PREPARE FRESH FROZEN PLASMA
UNIT DIVISION: 0
Unit division: 0
Unit division: 0
Unit division: 0

## 2017-04-13 LAB — POCT ACTIVATED CLOTTING TIME
ACTIVATED CLOTTING TIME: 142 s
Activated Clotting Time: 147 seconds

## 2017-04-13 LAB — POCT I-STAT, CHEM 8
BUN: 17 mg/dL (ref 6–20)
BUN: 12 mg/dL (ref 6–20)
CALCIUM ION: 1.14 mmol/L — AB (ref 1.15–1.40)
CHLORIDE: 102 mmol/L (ref 101–111)
CREATININE: 0.9 mg/dL (ref 0.61–1.24)
Calcium, Ion: 1.3 mmol/L (ref 1.15–1.40)
Chloride: 104 mmol/L (ref 101–111)
Creatinine, Ser: 1.1 mg/dL (ref 0.61–1.24)
GLUCOSE: 116 mg/dL — AB (ref 65–99)
Glucose, Bld: 105 mg/dL — ABNORMAL HIGH (ref 65–99)
HCT: 35 % — ABNORMAL LOW (ref 39.0–52.0)
HEMATOCRIT: 36 % — AB (ref 39.0–52.0)
HEMOGLOBIN: 11.9 g/dL — AB (ref 13.0–17.0)
HEMOGLOBIN: 12.2 g/dL — AB (ref 13.0–17.0)
POTASSIUM: 3.8 mmol/L (ref 3.5–5.1)
Potassium: 3.3 mmol/L — ABNORMAL LOW (ref 3.5–5.1)
SODIUM: 143 mmol/L (ref 135–145)
Sodium: 139 mmol/L (ref 135–145)
TCO2: 26 mmol/L (ref 22–32)
TCO2: 24 mmol/L (ref 22–32)

## 2017-04-13 LAB — CBC
HEMATOCRIT: 38.9 % — AB (ref 39.0–52.0)
HEMATOCRIT: 37.3 % — AB (ref 39.0–52.0)
HEMOGLOBIN: 12.6 g/dL — AB (ref 13.0–17.0)
HEMOGLOBIN: 12.2 g/dL — AB (ref 13.0–17.0)
MCH: 28.5 pg (ref 26.0–34.0)
MCH: 28.5 pg (ref 26.0–34.0)
MCHC: 32.4 g/dL (ref 30.0–36.0)
MCHC: 32.7 g/dL (ref 30.0–36.0)
MCV: 88 fL (ref 78.0–100.0)
MCV: 87.1 fL (ref 78.0–100.0)
PLATELETS: 89 10*3/uL — AB (ref 150–400)
Platelets: 85 10*3/uL — ABNORMAL LOW (ref 150–400)
RBC: 4.42 MIL/uL (ref 4.22–5.81)
RBC: 4.28 MIL/uL (ref 4.22–5.81)
RDW: 14.3 % (ref 11.5–15.5)
RDW: 14.5 % (ref 11.5–15.5)
WBC: 14.7 10*3/uL — ABNORMAL HIGH (ref 4.0–10.5)
WBC: 17.5 10*3/uL — ABNORMAL HIGH (ref 4.0–10.5)

## 2017-04-13 LAB — POCT I-STAT 4, (NA,K, GLUC, HGB,HCT)
GLUCOSE: 115 mg/dL — AB (ref 65–99)
Glucose, Bld: 111 mg/dL — ABNORMAL HIGH (ref 65–99)
HCT: 34 % — ABNORMAL LOW (ref 39.0–52.0)
HEMATOCRIT: 35 % — AB (ref 39.0–52.0)
HEMOGLOBIN: 11.9 g/dL — AB (ref 13.0–17.0)
HEMOGLOBIN: 11.6 g/dL — AB (ref 13.0–17.0)
POTASSIUM: 3.9 mmol/L (ref 3.5–5.1)
Potassium: 3.3 mmol/L — ABNORMAL LOW (ref 3.5–5.1)
Sodium: 140 mmol/L (ref 135–145)
Sodium: 139 mmol/L (ref 135–145)

## 2017-04-13 LAB — LACTIC ACID, PLASMA
LACTIC ACID, VENOUS: 1.6 mmol/L (ref 0.5–1.9)
Lactic Acid, Venous: 2.4 mmol/L (ref 0.5–1.9)

## 2017-04-13 LAB — MAGNESIUM
MAGNESIUM: 2.3 mg/dL (ref 1.7–2.4)
Magnesium: 1.8 mg/dL (ref 1.7–2.4)

## 2017-04-13 LAB — CREATININE, SERUM: Creatinine, Ser: 0.9 mg/dL (ref 0.61–1.24)

## 2017-04-13 LAB — PREPARE PLATELET PHERESIS: UNIT DIVISION: 0

## 2017-04-13 LAB — BPAM PLATELET PHERESIS
BLOOD PRODUCT EXPIRATION DATE: 201811162359
ISSUE DATE / TIME: 201811141912
Unit Type and Rh: 6200

## 2017-04-13 LAB — LACTATE DEHYDROGENASE: LDH: 332 U/L — ABNORMAL HIGH (ref 98–192)

## 2017-04-13 LAB — APTT: aPTT: 68 seconds — ABNORMAL HIGH (ref 24–36)

## 2017-04-13 MED ORDER — SODIUM CHLORIDE 0.9 % IV SOLN
0.7500 ug/kg/min | INTRAVENOUS | Status: AC
  Administered 2017-04-13 (×2): 0.75 ug/kg/min via INTRAVENOUS
  Filled 2017-04-13 (×2): qty 50

## 2017-04-13 MED ORDER — FUROSEMIDE 10 MG/ML IJ SOLN
5.0000 mg/h | INTRAVENOUS | Status: DC
  Administered 2017-04-13 – 2017-04-15 (×3): 5 mg/h via INTRAVENOUS
  Filled 2017-04-13 (×3): qty 25
  Filled 2017-04-13: qty 5

## 2017-04-13 MED ORDER — POTASSIUM CHLORIDE 10 MEQ/50ML IV SOLN
10.0000 meq | INTRAVENOUS | Status: AC
  Administered 2017-04-13 – 2017-04-14 (×3): 10 meq via INTRAVENOUS
  Filled 2017-04-13 (×3): qty 50

## 2017-04-13 MED ORDER — FENTANYL BOLUS VIA INFUSION
25.0000 ug | INTRAVENOUS | Status: DC | PRN
  Administered 2017-04-14 – 2017-04-23 (×8): 25 ug via INTRAVENOUS
  Filled 2017-04-13: qty 25

## 2017-04-13 MED ORDER — FENTANYL CITRATE (PF) 100 MCG/2ML IJ SOLN: 50.0000 ug | Freq: Once | INTRAMUSCULAR | Status: DC

## 2017-04-13 MED ORDER — FUROSEMIDE 10 MG/ML IJ SOLN: 40.0000 mg | Freq: Once | INTRAMUSCULAR | Status: DC | PRN

## 2017-04-13 MED ORDER — FENTANYL 2500MCG IN NS 250ML (10MCG/ML) PREMIX INFUSION
25.0000 ug/h | INTRAVENOUS | Status: DC
  Administered 2017-04-13: 200 ug/h via INTRAVENOUS
  Administered 2017-04-13: 50 ug/h via INTRAVENOUS
  Administered 2017-04-14 (×2): 200 ug/h via INTRAVENOUS
  Administered 2017-04-15: 150 ug/h via INTRAVENOUS
  Administered 2017-04-15: 250 ug/h via INTRAVENOUS
  Administered 2017-04-16 – 2017-04-17 (×3): 300 ug/h via INTRAVENOUS
  Administered 2017-04-17 (×2): 350 ug/h via INTRAVENOUS
  Administered 2017-04-18 (×3): 400 ug/h via INTRAVENOUS
  Administered 2017-04-18: 350 ug/h via INTRAVENOUS
  Administered 2017-04-19 (×2): 400 ug/h via INTRAVENOUS
  Administered 2017-04-19: 100 ug/h via INTRAVENOUS
  Administered 2017-04-20: 400 ug/h via INTRAVENOUS
  Administered 2017-04-20 – 2017-04-21 (×3): 200 ug/h via INTRAVENOUS
  Administered 2017-04-21 – 2017-04-23 (×6): 250 ug/h via INTRAVENOUS
  Administered 2017-04-24: 275 ug/h via INTRAVENOUS
  Filled 2017-04-13 (×29): qty 250

## 2017-04-13 MED ORDER — PANTOPRAZOLE SODIUM 40 MG PO PACK
40.0000 mg | PACK | Freq: Every day | ORAL | Status: DC
  Administered 2017-04-13 – 2017-04-27 (×14): 40 mg
  Filled 2017-04-13 (×14): qty 20

## 2017-04-13 MED ORDER — INSULIN ASPART 100 UNIT/ML ~~LOC~~ SOLN
0.0000 [IU] | SUBCUTANEOUS | Status: DC
  Administered 2017-04-14 – 2017-04-26 (×50): 2 [IU] via SUBCUTANEOUS

## 2017-04-13 NOTE — Progress Notes (Signed)
ANTICOAGULATION CONSULT NOTE  Pharmacy Consult for Heparin Indication: Impella  No Known Allergies  Patient Measurements: Height: 5\' 7"  (170.2 cm) Weight: 219 lb 5.7 oz (99.5 kg) IBW/kg (Calculated) : 66.1 Heparin Dosing Weight:   Vital Signs: Temp: 99.1 F (37.3 C) (11/15 1100) Temp Source: Core (11/15 0400) BP: 100/79 (11/15 1100) Pulse Rate: 88 (11/15 1100)  Labs: Recent Labs    04/11/17 2138  04/12/17 0057 04/12/17 0605 04/12/17 1248  04/12/17 1750 04/12/17 1758 04/12/17 1922 04/12/17 2147 04/12/17 2154 04/13/17 0348  HGB 14.5   < > 13.5 12.1*  --    < > 8.6* 9.2* 8.8* 13.0 12.9* 12.6*  HCT 46.7   < > 42.5 38.5*  --    < > 27.4* 27.0* 26.0* 40.2 38.0* 38.9*  PLT 235  --  194 202  --   --  116*  --   --  79*  --  89*  APTT 30  --   --   --   --   --   --   --   --  41*  --  68*  LABPROT 15.9*  --   --   --   --   --   --   --   --  13.0  --   --   INR 1.28  --   --   --   --   --   --   --   --  0.99  --   --   CREATININE 1.67*   < > 1.52* 1.42*  --    < >  --  0.90 0.80  --   --  1.06  TROPONINI 0.09*  --  0.11* 0.18* 0.29*  --   --   --   --   --   --   --    < > = values in this interval not displayed.    Estimated Creatinine Clearance: 75 mL/min (by C-G formula based on SCr of 1.06 mg/dL).   Medical History: Past Medical History:  Diagnosis Date  . Coronary artery disease   . Hypertension     Assessment: 66 yo male presents with STEMI, brought to cath, received DES for RCA and IABP placed for high LVEDP. Patient was later found to have a LV pseudoanyeurism rupture, brought to OR for repair and Impella was placed for cardiogenic shock. IABP now removed.   Impella 5.0, P7, Flow Rate 3.8, Purge Rate 17.6. APTT 68, plts 89 (quick drop, HIT panel sent out).   Goal of Therapy:  Monitor platelets by anticoagulation protocol: Yes   Plan:  Continue heparin 50,000 units IV per RN protocol  Monitor daily APTT Monitor platelets and results of HIT antibody  panel

## 2017-04-13 NOTE — Progress Notes (Signed)
Initial Nutrition Assessment  DOCUMENTATION CODES:   Obesity unspecified  INTERVENTION:   -Recommend insertion of Cortrak feeding tube with plans to initiate EN if unable to extubate within 24-48 hours  Tube Feeding Recommendations:   Vital High Protein @ 50 ml/hr with Pro-Stat 30 mL BID providing 136 g of protein, 1400 kcals, 1008 mL of free water  NUTRITION DIAGNOSIS:   Inadequate oral intake related to acute illness, inability to eat as evidenced by NPO status.  GOAL:   Provide needs based on ASPEN/SCCM guidelines  MONITOR:   Vent status, Labs, Weight trends  REASON FOR ASSESSMENT:   Ventilator    ASSESSMENT:   66 yo male admitted with sunset onset of SOB and chest pain, found to have inferolateral STEMI and taken to cath lab (distal RCA lesion with DES placed, 100% stenosed mid LAD lesion that was balloon angioplastied, IABP placed). Pt intubated prior to cardiac cath. Pt found to have LV aneurysm on 11/14 and taking emergently to OR. Pt with hx of CAD, HTN  11/13 cardiac cath 11/14 LV aneurysm s/p resection, Impella placement, open sternum 11/15 intra-aortic balloon pump removed today  Spoke with Vincenza HewsShane RN this AM reporting pt to possibly to return to OR tomorrow  No family at bedside; pt sedated. Unable to obtain diet or weight history on visit today. No previous weight encounters.  Patient is currently intubated on ventilator support, in shock, on 2 pressors MV: 12.1 L/min Temp (24hrs), Avg:98.8 F (37.1 C), Min:98.1 F (36.7 C), Max:99.3 F (37.4 C)  Labs: reviewed Meds: insulin drip, lasix drip, precedex, fentanyl, phenylephrine, epinephrine   NUTRITION - FOCUSED PHYSICAL EXAM:    Most Recent Value  Orbital Region  No depletion  Upper Arm Region  No depletion  Thoracic and Lumbar Region  No depletion  Buccal Region  Unable to assess  Temple Region  No depletion  Clavicle Bone Region  No depletion  Clavicle and Acromion Bone Region  No depletion   Scapular Bone Region  Unable to assess  Dorsal Hand  No depletion  Patellar Region  No depletion  Anterior Thigh Region  No depletion  Posterior Calf Region  No depletion  Edema (RD Assessment)  Moderate  Hair  Reviewed  Eyes  Unable to assess  Mouth  Unable to assess  Skin  Reviewed  Nails  Reviewed       Diet Order:  No diet orders on file  EDUCATION NEEDS:   Not appropriate for education at this time  Skin:  Skin Assessment: Reviewed RN Assessment  Last BM:  no documented BM  Height:   Ht Readings from Last 1 Encounters:  04/13/17 5\' 7"  (1.702 m)    Weight:   Wt Readings from Last 1 Encounters:  04/13/17 219 lb 5.7 oz (99.5 kg)    Ideal Body Weight:  67.3 kg  BMI:  Body mass index is 34.36 kg/m.  Estimated Nutritional Needs:   Kcal:  [id number] kcals  Protein:  135-168 g  Fluid:  >/= 1.8 L   Romelle Starcherate Danaija Eskridge MS, RD, LDN, CNSC [phone] Pager  [phone] Weekend/On-Call Pager

## 2017-04-13 NOTE — Progress Notes (Signed)
Advanced Heart Failure Rounding Note   Subjective:    S/P LV aneurysm repeat 11/14 Impella 5.0 Placed. chect remains open. IABP weaning with plan to remove this morning.   Currently intubated. On amio drip 30 mg per hour and epi 2 mcg,   Follows commands. Denies pain.   Impella flow 3.8L on P-7   Swan #s CVP 11 PAP 30/17 PAPi 1.2  SVR 1,024 CO 4.8 CI 4.8   Objective:   Weight Range: 219 lb 5.7 oz (99.5 kg) Body mass index is 34.36 kg/m.   Vital Signs:   Temp:  [97.7 F (36.5 C)-99.5 F (37.5 C)] 98.8 F (37.1 C) (11/15 0715) Pulse Rate:  [28-104] 89 (11/15 0715) Resp:  [14-27] 19 (11/15 0715) BP: (76-154)/(55-90) 115/90 (11/15 0700) SpO2:  [95 %-100 %] 98 % (11/15 0715) Arterial Line BP: (46-183)/(37-79) 73/61 (11/15 0715) FiO2 (%):  [50 %-100 %] 70 % (11/15 0400) Weight:  [219 lb 5.7 oz (99.5 kg)] 219 lb 5.7 oz (99.5 kg) (11/15 0440) Last BM Date: (PTA)  Weight change: Filed Weights   04/12/17 0030 04/12/17 0615 04/13/17 0440  Weight: 215 lb 6.2 oz (97.7 kg) 200 lb 6.4 oz (90.9 kg) 219 lb 5.7 oz (99.5 kg)    Intake/Output:   Intake/Output Summary (Last 24 hours) at 04/13/2017 0733 Last data filed at 04/13/2017 0700 Gross per 24 hour  Intake 10270.53 ml  Output 3735 ml  Net 6535.53 ml      Physical Exam   CVP11 General:  Intubataed. Awake HEENT: ETT RIJ swan  Neck:  Carotids 2+ bilat; no bruits. No lymphadenopathy or thyromegaly appreciated. Cor: PMI nondisplaced. Regular rate & rhythm. No rubs, gallops or murmurs.Chest open with dressing intact. R upper chest impella.  Lungs: Clear Abdomen: Soft, nontender, nondistended. No hepatosplenomegaly. No bruits or masses. No bowel sounds. Extremities: No cyanosis, clubbing, rash, R and LLE 2+ edema L radial a line  Neuro: Alert MAE. Intubated.   Telemetry  NSR 80s   EKG  N/A  Labs    CBC Recent Labs    04/11/17 2138  04/12/17 2147 04/12/17 2154 04/13/17 0348  WBC 13.9*   < > 12.1*  --   14.7*  NEUTROABS 7.2  --   --   --   --   HGB 14.5   < > 13.0 12.9* 12.6*  HCT 46.7   < > 40.2 38.0* 38.9*  MCV 93.0   < > 89.5  --  88.0  PLT 235   < > 79*  --  89*   < > = values in this interval not displayed.   Basic Metabolic Panel Recent Labs    04/12/17 0605 04/12/17 1248  04/12/17 1922 04/12/17 2154 04/13/17 0348  NA 140  --    < > 142 143 136  K 4.2  --    < > 3.6 3.6 4.6  CL 107  --    < > 104  --  109  CO2 24  --   --   --   --  22  GLUCOSE 119*  --    < > 122* 153* 155*  BUN 19  --    < > 14  --  12  CREATININE 1.42*  --    < > 0.80  --  1.06  CALCIUM 7.8*  --   --   --   --  8.9  MG  --  1.6*  --   --   --  2.3  PHOS  --  2.8  --   --   --   --    < > = values in this interval not displayed.   Liver Function Tests Recent Labs    04/11/17 2138 04/12/17 0057  AST 32 39  ALT 20 27  ALKPHOS 86 75  BILITOT 0.5 0.7  PROT 6.9 6.3*  ALBUMIN 3.4* 3.0*   No results for input(s): LIPASE, AMYLASE in the last 72 hours. Cardiac Enzymes Recent Labs    04/12/17 0057 04/12/17 0605 04/12/17 1248  TROPONINI 0.11* 0.18* 0.29*    BNP: BNP (last 3 results) No results for input(s): BNP in the last 8760 hours.  ProBNP (last 3 results) No results for input(s): PROBNP in the last 8760 hours.   D-Dimer No results for input(s): DDIMER in the last 72 hours. Hemoglobin A1C No results for input(s): HGBA1C in the last 72 hours. Fasting Lipid Panel Recent Labs    04/11/17 2138  CHOL 167  HDL 27*  LDLCALC 118*  TRIG 109  CHOLHDL 6.2   Thyroid Function Tests No results for input(s): TSH, T4TOTAL, T3FREE, THYROIDAB in the last 72 hours.  Invalid input(s): FREET3  Other results:   Imaging    Dg Chest Port 1 View  Result Date: 04/12/2017 CLINICAL DATA:  66 year old male with surgery and follow-up. EXAM: PORTABLE CHEST 1 VIEW COMPARISON:  Chest radiograph dated 06/12/2016 FINDINGS: Endotracheal tube above the carina, right IJ central venous line, partially  visualized enteric tube in similar positioning. An interval aortic balloon pump is noted with tip over the descending thoracic aorta at the level of the left hilum. The positioning of the tip of the balloon pump appears slightly lower compared to the prior radiograph. This however may be positional and due to slight tilted positioning of the patient. Attention on follow-up radiograph recommended. There has been interval placement of 80 left IJ Swan-Ganz catheter with tip in the region of the right hilum/ right pulmonary artery. There is persistent perihilar edema with diffuse streaky densities without significant interval change. There is probable small right pleural effusion. The left costophrenic angle has been excluded from the image. There is no pneumothorax. There is stable cardiomegaly. No acute osseous pathology. IMPRESSION: 1. Interval placement of a left IJ Swan-Ganz catheter with tip over the right hilum in the region of the right pulmonary artery. 2. The intra-aortic balloon pump appears slightly lower in position compared to the prior radiograph. However comments may be due to slight tilted positioning of the patient. Attention on follow-up radiograph recommended. Other support devices in similar position. 3. No significant change in the perihilar edema and interstitial densities. Electronically Signed   By: Anner Crete M.D.   On: 04/12/2017 22:17   Dg Chest Port 1 View  Result Date: 04/12/2017 CLINICAL DATA:  Hypoxia EXAM: PORTABLE CHEST 1 VIEW COMPARISON:  April 12, 2017 FINDINGS: Endotracheal tube tip is 3.6 cm above the carina. Nasogastric tube tip and side port are below the diaphragm. Intra-aortic balloon pump tip is in the proximal descending thoracic aortic region, stable. Central catheter tip is in the superior vena cava. An apparent monitor leads either in or overlying the mid esophagus. No pneumothorax. There is patchy infiltrate in the right upper lobe, less than on study  obtained earlier in the day. There is perihilar interstitial edema. There is cardiomegaly with small left pleural effusion. There is mild pulmonary venous hypertension. No bone lesions. No adenopathy. IMPRESSION: Tube and catheter positions as described  without pneumothorax. Less airspace opacity right upper lobe compared to earlier in the day. Persistent perihilar edema remains with small left pleural effusion. Stable cardiomegaly. Electronically Signed   By: William  Woodruff III M.D.   On: 04/12/2017 09:21      Medications:     Scheduled Medications: . acetaminophen  1,000 mg Oral Q6H   Or  . acetaminophen (TYLENOL) oral liquid 160 mg/5 mL  1,000 mg Per Tube Q6H  . aspirin EC  325 mg Oral Daily   Or  . aspirin  324 mg Per Tube Daily  . bisacodyl  10 mg Oral Daily   Or  . bisacodyl  10 mg Rectal Daily  . chlorhexidine gluconate (MEDLINE KIT)  15 mL Mouth Rinse BID  . docusate sodium  200 mg Oral Daily  . insulin regular  0-10 Units Intravenous TID WC  . mouth rinse  15 mL Mouth Rinse 10 times per day  . metoprolol tartrate  12.5 mg Oral BID   Or  . metoprolol tartrate  12.5 mg Per Tube BID  . [START ON 04/14/2017] pantoprazole  40 mg Oral Daily  . sodium chloride flush  3 mL Intravenous Q12H     Infusions: . sodium chloride 20 mL/hr at 04/12/17 2200  . sodium chloride    . sodium chloride 20 mL/hr at 04/12/17 2200  . sodium chloride    . albumin human    . amiodarone 30 mg/hr (04/13/17 0506)  . cefUROXime (ZINACEF)  IV Stopped (04/13/17 0656)  . dexmedetomidine (PRECEDEX) IV infusion 0.7 mcg/kg/hr (04/13/17 0509)  . epinephrine 2 mcg/min (04/13/17 0300)  . famotidine (PEPCID) IV Stopped (04/12/17 2231)  . impella catheter heparin 50 unit/mL in dextrose 5%    . insulin (NOVOLIN-R) infusion 1.6 Units/hr (04/13/17 0700)  . lactated ringers    . lactated ringers 20 mL/hr at 04/12/17 2200  . lactated ringers 20 mL/hr at 04/13/17 0408  . nitroGLYCERIN Stopped (04/12/17 2200)   . phenylephrine (NEO-SYNEPHRINE) Adult infusion Stopped (04/13/17 0100)     PRN Medications:  sodium chloride, albumin human, lactated ringers, metoprolol tartrate, midazolam, morphine injection, morphine injection, ondansetron (ZOFRAN) IV, oxyCODONE, sodium chloride flush, traMADol    Patient Profile    Mr Woodyard is 66 year old with h/o MI, HTN, COPD admitted with chest pain. EKG with evidence of inferior/lateral STEMI. Due to lethargy and hypoxemia he was intubated in the ED.  Taken to cath lab with RCA DES and POBA to chronically occluded LAD with IABP placed.   Had LV aneurysm repair.     Assessment/Plan  1. S/P LV aneurysm repair 11/14 Impella 5.0 placed. Chest open.  On epi 2 mcg.  2. STEMI -LHC -RCA DES and ballon angioplasty to mid lad with 25% residual stenosis.  IABP placed due to high LVEDP. Plan to remove today.  -On asa 3. Acute Hypoxic Respiratory Failure  Intubated 11/13 prior to cath CCM following. Remains intubated.   4. Cardiogenic Shock  - Impella 5.0. On epi 2 mcg.  --No bb. No ARB for now.  5. AKI  Admit creatinine 1.67 Todays creatinine 1.06 6. COPD   Length of Stay: 2   Amy Clegg, NP  04/13/2017, 7:33 AM  Advanced Heart Failure Team Pager [phone] (M-F; 7a - 4p)  Please contact [facility] for night-coverage after hours (4p -7a ) and weekends on [email or web address]  Agree with above.  S/p LV pseudoaneurysm repair.  Awake on vent. Chest open. On epi 2 and IV amio. Impella-5   in place. Working well. Position looks good.   Swan numbers look good. CVP 11. Weight up 22 pounds.   Exam Awake on vent Impella site ok Chest open.  Cor RRR + impella hum Lungs mild rhonchi Ab soft quite Ext 2-3+ edema.   Will attempt gentle diuresis with lasix gtt at 5. Watch for suction events. Can turn Impella down as needed. Continue epi. Will continue heparin for Impella through purge. Start IV cangrelor for stent. D/w PharmD and Dr. Gerhardt.   CRITICAL  CARE Performed by: ,   Total critical care time: 40 minutes  Critical care time was exclusive of separately billable procedures and treating other patients.  Critical care was necessary to treat or prevent imminent or life-threatening deterioration.  Critical care was time spent personally by me (independent of midlevel providers or residents) on the following activities: development of treatment plan with patient and/or surrogate as well as nursing, discussions with consultants, evaluation of patient's response to treatment, examination of patient, obtaining history from patient or surrogate, ordering and performing treatments and interventions, ordering and review of laboratory studies, ordering and review of radiographic studies, pulse oximetry and re-evaluation of patient's condition.  , , MD  9:37 AM      

## 2017-04-13 NOTE — Progress Notes (Signed)
[facility] BRIEF SICU PROGRESS NOTE  1 Day Post-Op  S/P Procedure(s) (LRB): Resection of LV Aneurysm (Left) PLACEMENT OF IMPELLA  LD   Sedated on vent AAI w/ stable hemodynamics on minimal dose Epi drip, Impella flow 3.8-3.9 L/min O2 sats 100% on 70% FiO2 Minimal chest tube output Excellent UOP  Plan: Continue current plan  Ronald Nailslarence H Kamaiya Antilla, MD 04/13/2017 7:10 PM

## 2017-04-13 NOTE — Progress Notes (Signed)
Patient ID: [name], male   DOB: 1950/10/28, 66 y.o.   MRN: [id number] EVENING ROUNDS NOTE :     [address]       [place] [id number]             [phone]                 1 Day Post-Op Procedure(s) (LRB): Resection of LV Aneurysm (Left) PLACEMENT OF IMPELLA  LD  Total Length of Stay:  LOS: 2 days  BP 103/80   Pulse 91   Temp 99.5 F (37.5 C)   Resp (!) 24   Ht 5\' 7"  (1.702 m)   Wt 219 lb 5.7 oz (99.5 kg)   SpO2 99%   BMI 34.36 kg/m   .Intake/Output      11/14 0701 - 11/15 0700 11/15 0701 - 11/16 0700   I.V. (mL/kg) 5378.2 (54.1) 1600.4 (16.1)   Blood 3343    Other 169.3 175.2   NG/GT 30    IV Piggyback 1350 50   Total Intake(mL/kg) 10270.5 (103.2) 1825.6 (18.3)   Urine (mL/kg/hr) 1820 (0.8) 1940 (1.8)   Emesis/NG output 300 350   Drains 75    Blood 1370    Chest Tube 170 130   Total Output 3735 2420   Net +6535.5 -594.4          . sodium chloride 20 mL/hr at 04/13/17 1700  . sodium chloride    . sodium chloride 20 mL/hr at 04/13/17 1700  . sodium chloride    . amiodarone 30 mg/hr (04/13/17 1718)  . cangrelor 50 mg in NS 250 mL 0.75 mcg/kg/min (04/13/17 1700)  . cefUROXime (ZINACEF)  IV Stopped (04/13/17 [id number])  . dexmedetomidine (PRECEDEX) IV infusion 0.7 mcg/kg/hr (04/13/17 1700)  . epinephrine 1.013 mcg/min (04/13/17 1700)  . fentaNYL infusion INTRAVENOUS 150 mcg/hr (04/13/17 1700)  . furosemide (LASIX) infusion 5 mg/hr (04/13/17 1700)  . impella catheter heparin 50 unit/mL in dextrose 5%    . insulin (NOVOLIN-R) infusion 1.3 Units/hr (04/13/17 1700)  . lactated ringers    . lactated ringers 20 mL/hr at 04/13/17 1700  . lactated ringers 20 mL/hr at 04/13/17 1700  . nitroGLYCERIN Stopped (04/12/17 2200)  . phenylephrine (NEO-SYNEPHRINE) Adult infusion Stopped (04/13/17 1735)     Lab Results  Component Value Date   WBC 17.5 (H) 04/13/2017   HGB 12.2 (L) 04/13/2017   HCT 37.3 (L) 04/13/2017   PLT 85 (L) 04/13/2017   GLUCOSE  116 (H) 04/13/2017   CHOL 167 04/11/2017   TRIG 109 04/11/2017   HDL 27 (L) 04/11/2017   LDLCALC 118 (H) 04/11/2017   ALT 27 04/12/2017   AST 39 04/12/2017   NA 139 04/13/2017   K 3.8 04/13/2017   CL 102 04/13/2017   CREATININE 0.90 04/13/2017   BUN 12 04/13/2017   CO2 22 04/13/2017   INR 0.99 04/12/2017   Patient has remained relatively stable today today he has had 1.8 L of urine output on Lasix drip, Level of pressor support has significantly decreased now just on 1 mcg/min now We will plan to return to the OR in the a.m. for washout of mediastinum and potentially closing the sternum at this point will leave the ampulla in place, for several days longer.  Delight OvensEdward B Patrici Minnis MD  Beeper [phone] Office [phone] 04/13/2017 5:51 PM

## 2017-04-13 NOTE — Progress Notes (Signed)
IABP aspirated and removed from RFA. Manual pressure applied for 30 minutes. Groin level 0, no s+s of hematoma. Tegaderm dressing applied. Patient sedated and intubated. Bilateral dp and pt pulses present with doppler.

## 2017-04-13 NOTE — Progress Notes (Addendum)
Patient ID: [name], male   DOB: [birth year], 66 y.o.   MRN: [id number] TCTS DAILY ICU PROGRESS NOTE                   [address]            [place] [address]          [phone]   1 Day Post-Op Procedure(s) (LRB): Resection of LV Aneurysm (Left) PLACEMENT OF IMPELLA  LD  Total Length of Stay:  LOS: 2 days   Subjective: Patient remains on ventilator, sedated, he is neurologically intact opens his eyes to commands and follows commands.  Objective: Vital signs in last 24 hours: Temp:  [97.7 F (36.5 C)-99.5 F (37.5 C)] 98.8 F (37.1 C) (11/15 0715) Pulse Rate:  [28-104] 89 (11/15 0715) Cardiac Rhythm: Atrial paced (11/15 0400) Resp:  [14-27] 19 (11/15 0715) BP: (87-154)/(63-90) 100/69 (11/15 0800) SpO2:  [95 %-100 %] 98 % (11/15 0715) Arterial Line BP: (46-183)/(37-79) 73/61 (11/15 0715) FiO2 (%):  [50 %-100 %] 70 % (11/15 0400) Weight:  [219 lb 5.7 oz (99.5 kg)] 219 lb 5.7 oz (99.5 kg) (11/15 0440)  Filed Weights   04/12/17 0030 04/12/17 0615 04/13/17 0440  Weight: 215 lb 6.2 oz (97.7 kg) 200 lb 6.4 oz (90.9 kg) 219 lb 5.7 oz (99.5 kg)    Weight change: 59 lb 5.7 oz (26.9 kg)   Hemodynamic parameters for last 24 hours: PAP: (25-41)/(16-26) 36/23 CVP:  [5 mmHg-18 mmHg] 14 mmHg CO:  [3.4 L/min-5.2 L/min] 4.2 L/min CI:  [1.7 L/min/m2-2.6 L/min/m2] 2.1 L/min/m2  Intake/Output from previous day: 11/14 0701 - 11/15 0700 In: 10270.5 [I.V.:5378.2; Blood:3343; NG/GT:30; IV Piggyback:1350] Out: 0347 [Urine:1820; Emesis/NG output:300; Drains:75; Blood:1370; Chest Tube:170]  Intake/Output this shift: Total I/O In: 16.8 [Other:16.8] Out: -   Current Meds: Scheduled Meds: . acetaminophen  1,000 mg Oral Q6H   Or  . acetaminophen (TYLENOL) oral liquid 160 mg/5 mL  1,000 mg Per Tube Q6H  . aspirin EC  325 mg Oral Daily   Or  . aspirin  324 mg Per Tube Daily  . bisacodyl  10 mg Oral Daily   Or  . bisacodyl  10 mg Rectal Daily  . chlorhexidine  gluconate (MEDLINE KIT)  15 mL Mouth Rinse BID  . docusate sodium  200 mg Oral Daily  . insulin regular  0-10 Units Intravenous TID WC  . mouth rinse  15 mL Mouth Rinse 10 times per day  . metoprolol tartrate  12.5 mg Oral BID   Or  . metoprolol tartrate  12.5 mg Per Tube BID  . [START ON 04/14/2017] pantoprazole  40 mg Oral Daily  . sodium chloride flush  3 mL Intravenous Q12H   Continuous Infusions: . sodium chloride 20 mL/hr at 04/12/17 2200  . sodium chloride    . sodium chloride 20 mL/hr at 04/12/17 2200  . sodium chloride    . albumin human    . amiodarone 30 mg/hr (04/13/17 0506)  . cefUROXime (ZINACEF)  IV Stopped (04/13/17 4259)  . dexmedetomidine (PRECEDEX) IV infusion 0.7 mcg/kg/hr (04/13/17 0509)  . epinephrine 2 mcg/min (04/13/17 0300)  . famotidine (PEPCID) IV Stopped (04/12/17 2231)  . impella catheter heparin 50 unit/mL in dextrose 5%    . insulin (NOVOLIN-R) infusion 1.6 Units/hr (04/13/17 0700)  . lactated ringers    . lactated ringers 20 mL/hr at 04/12/17 2200  . lactated ringers 20 mL/hr at 04/13/17 0408  . nitroGLYCERIN Stopped (  04/12/17 2200)  . phenylephrine (NEO-SYNEPHRINE) Adult infusion Stopped (04/13/17 0100)   PRN Meds:.sodium chloride, albumin human, lactated ringers, metoprolol tartrate, midazolam, morphine injection, morphine injection, ondansetron (ZOFRAN) IV, oxyCODONE, sodium chloride flush, traMADol  General appearance: cooperative and no distress Neurologic: intact Heart: regular rate and rhythm, S1, S2 normal, no murmur, click, rub or gallop Lungs: diminished breath sounds bibasilar Abdomen: soft, non-tender; bowel sounds normal; no masses,  no organomegaly Extremities: Extremities warm, dopplerable pedal pulses Wound: Esmark dressing in place wound VAC functioning  Lab Results: CBC: Recent Labs    04/12/17 2147 04/12/17 2154 04/13/17 0348  WBC 12.1*  --  14.7*  HGB 13.0 12.9* 12.6*  HCT 40.2 38.0* 38.9*  PLT 79*  --  89*   BMET:   Recent Labs    04/12/17 0605  04/12/17 1922 04/12/17 2154 04/13/17 0348  NA 140   < > 142 143 136  K 4.2   < > 3.6 3.6 4.6  CL 107   < > 104  --  109  CO2 24  --   --   --  22  GLUCOSE 119*   < > 122* 153* 155*  BUN 19   < > 14  --  12  CREATININE 1.42*   < > 0.80  --  1.06  CALCIUM 7.8*  --   --   --  8.9   < > = values in this interval not displayed.    CMET: Lab Results  Component Value Date   WBC 14.7 (H) 04/13/2017   HGB 12.6 (L) 04/13/2017   HCT 38.9 (L) 04/13/2017   PLT 89 (L) 04/13/2017   GLUCOSE 155 (H) 04/13/2017   CHOL 167 04/11/2017   TRIG 109 04/11/2017   HDL 27 (L) 04/11/2017   LDLCALC 118 (H) 04/11/2017   ALT 27 04/12/2017   AST 39 04/12/2017   NA 136 04/13/2017   K 4.6 04/13/2017   CL 109 04/13/2017   CREATININE 1.06 04/13/2017   BUN 12 04/13/2017   CO2 22 04/13/2017   INR 0.99 04/12/2017      PT/INR:  Recent Labs    04/12/17 2147  LABPROT 13.0  INR 0.99   Radiology: Dg Chest Port 1 View  Result Date: 04/12/2017 CLINICAL DATA:  66 year old male with surgery and follow-up. EXAM: PORTABLE CHEST 1 VIEW COMPARISON:  Chest radiograph dated 06/12/2016 FINDINGS: Endotracheal tube above the carina, right IJ central venous line, partially visualized enteric tube in similar positioning. An interval aortic balloon pump is noted with tip over the descending thoracic aorta at the level of the left hilum. The positioning of the tip of the balloon pump appears slightly lower compared to the prior radiograph. This however may be positional and due to slight tilted positioning of the patient. Attention on follow-up radiograph recommended. There has been interval placement of 80 left IJ Swan-Ganz catheter with tip in the region of the right hilum/ right pulmonary artery. There is persistent perihilar edema with diffuse streaky densities without significant interval change. There is probable small right pleural effusion. The left costophrenic angle has been excluded  from the image. There is no pneumothorax. There is stable cardiomegaly. No acute osseous pathology. IMPRESSION: 1. Interval placement of a left IJ Swan-Ganz catheter with tip over the right hilum in the region of the right pulmonary artery. 2. The intra-aortic balloon pump appears slightly lower in position compared to the prior radiograph. However comments may be due to slight tilted positioning of the patient.  Attention on follow-up radiograph recommended. Other support devices in similar position. 3. No significant change in the perihilar edema and interstitial densities. Electronically Signed   By: Arash  Radparvar M.D.   On: 04/12/2017 22:17   Dg Chest Port 1 View  Result Date: 04/12/2017 CLINICAL DATA:  Hypoxia EXAM: PORTABLE CHEST 1 VIEW COMPARISON:  April 12, 2017 FINDINGS: Endotracheal tube tip is 3.6 cm above the carina. Nasogastric tube tip and side port are below the diaphragm. Intra-aortic balloon pump tip is in the proximal descending thoracic aortic region, stable. Central catheter tip is in the superior vena cava. An apparent monitor leads either in or overlying the mid esophagus. No pneumothorax. There is patchy infiltrate in the right upper lobe, less than on study obtained earlier in the day. There is perihilar interstitial edema. There is cardiomegaly with small left pleural effusion. There is mild pulmonary venous hypertension. No bone lesions. No adenopathy. IMPRESSION: Tube and catheter positions as described without pneumothorax. Less airspace opacity right upper lobe compared to earlier in the day. Persistent perihilar edema remains with small left pleural effusion. Stable cardiomegaly. Electronically Signed   By: William  Woodruff III M.D.   On: 04/12/2017 09:21     Assessment/Plan: S/P Procedure(s) (LRB): Resection of LV Aneurysm (Left) PLACEMENT OF IMPELLA  LD Respiratory-stable continue on ventilator Intra-aortic balloon pump will be removed today Renal function is  improved slightly from preop continue to monitor Leave Impala in place for now-we will need to add low-dose heparin, platelet count is 89,000-follow ACT Chest can be closed in the next 48-72 hours, and possibly remove Impala at the same time Impala currently on P7, with flows of 3.8 L/min, CVP 11 Continues on amiodarone drip for V. fib at the end of the case Pressors have been reduced to epinephrine 2     B  04/13/2017 8:20 AM  

## 2017-04-13 NOTE — Progress Notes (Signed)
PULMONARY / CRITICAL CARE MEDICINE   Name: [name] MRN: [id number] DOB: 08-Jun-1950    ADMISSION DATE:  04/11/2017 CONSULTATION DATE:  04/11/17  REFERRING MD:  Eldridge DaceVaranasi  CHIEF COMPLAINT:  STEMI  HISTORY OF PRESENT ILLNESS:  Ronald HoveGary [name] is a 66 y.o. male with PMH of CAD and HTN.  [facility] with sudden onset of SOB and chest pain.  He was found to have inferolateral STEMI and was taken urgently to cath lab.  Prior to cath lab, he was intubated; therefore, PCCM asked to see in consultation to assist with vent management.  In cath lab, he was found to have 100% stenosed mid LAD lesion that was balloon angioplastied with 25% residual stenosis, distal RCA lesion which had DES placed, Ost 1st diag lesion 50% stenosed, proximal ramus 70% stenosed.  IABP was also placed. Has had ongoing shock, pressor dependent.    SUBJECTIVE:  Found to have  LV rupture. Taken emergently to OR for repair. Post op on vent in shock. 11/14  11/15 Remains shock. IABP, Impella, and 2mcg epi. Open chest. IABP scheduled to come out. Full support vent. Precedex sedation RASS 0.   VITAL SIGNS: BP 115/90   Pulse 89   Temp 98.8 F (37.1 C)   Resp 19   Ht 5\' 7"  (1.702 m)   Wt 99.5 kg (219 lb 5.7 oz)   SpO2 98%   BMI 34.36 kg/m   HEMODYNAMICS: PAP: (25-41)/(16-26) 36/23 CVP:  [5 mmHg-18 mmHg] 14 mmHg CO:  [3.4 L/min-5.2 L/min] 4.2 L/min CI:  [1.7 L/min/m2-2.6 L/min/m2] 2.1 L/min/m2  VENTILATOR SETTINGS: Vent Mode: PRVC FiO2 (%):  [50 %-100 %] 70 % Set Rate:  [18 bmp-24 bmp] 24 bmp Vt Set:  [500 mL-580 mL] 500 mL PEEP:  [5 cmH20] 5 cmH20 Plateau Pressure:  [18 cmH20-26 cmH20] 18 cmH20  INTAKE / OUTPUT: I/O last 3 completed shifts: In: 11248.2 [I.V.:6275.9; Blood:3343; Other:169.3; NG/GT:60; IV Piggyback:1400] Out: 5705 [Urine:3190; Emesis/NG output:900; Drains:75; Blood:1370; Chest Tube:170]   PHYSICAL EXAMINATION: General: Adult male, acutely ill appearing, in NAD Neuro: Awake  on vent. Denies pain.  HEENT: [place]/AT, PERRL, no JVD Cardiovascular: Mechanical assist with heart sounds. RRR, no MRG  Lungs: Vent supported breaths, clear.  Abdomen: BS x 4, soft, NT/ND.  Musculoskeletal: No gross deformities, no edema.  Skin: open chest, wound vac in place.   LABS:  BMET Recent Labs  Lab 04/12/17 0057 04/12/17 0605  04/12/17 1758 04/12/17 1922 04/12/17 2154 04/13/17 0348  NA 140 140   < > 142 142 143 136  K 4.3 4.2   < > 4.2 3.6 3.6 4.6  CL 109 107   < > 102 104  --  109  CO2 24 24  --   --   --   --  22  BUN 20 19   < > 15 14  --  12  CREATININE 1.52* 1.42*   < > 0.90 0.80  --  1.06  GLUCOSE 150* 119*   < > 127* 122* 153* 155*   < > = values in this interval not displayed.    Electrolytes Recent Labs  Lab 04/12/17 0057 04/12/17 0605 04/12/17 1248 04/13/17 0348  CALCIUM 8.1* 7.8*  --  8.9  MG  --   --  1.6* 2.3  PHOS  --   --  2.8  --     CBC Recent Labs  Lab 04/12/17 0605  04/12/17 1750  04/12/17 2147 04/12/17 2154 04/13/17 0348  WBC 12.6*  --   --   --  12.1*  --  14.7*  HGB 12.1*   < > 8.6*   < > 13.0 12.9* 12.6*  HCT 38.5*   < > 27.4*   < > 40.2 38.0* 38.9*  PLT 202  --  116*  --  79*  --  89*   < > = values in this interval not displayed.    Coag's Recent Labs  Lab 04/11/17 2138 04/12/17 2147 04/13/17 0348  APTT 30 41* 68*  INR 1.28 0.99  --     Sepsis Markers Recent Labs  Lab 04/12/17 1248 04/12/17 1254  LATICACIDVEN  --  0.9  PROCALCITON 1.16  --     ABG Recent Labs  Lab 04/12/17 2000 04/12/17 2206 04/13/17 0357  PHART 7.355 7.311* 7.330*  PCO2ART 45.9 48.5* 41.3  PO2ART 67.0* 82.0* 101.0    Liver Enzymes Recent Labs  Lab 04/11/17 2138 04/12/17 0057  AST 32 39  ALT 20 27  ALKPHOS 86 75  BILITOT 0.5 0.7  ALBUMIN 3.4* 3.0*    Cardiac Enzymes Recent Labs  Lab 04/12/17 0057 04/12/17 0605 04/12/17 1248  TROPONINI 0.11* 0.18* 0.29*    Glucose Recent Labs  Lab 04/13/17 0101 04/13/17 0156  04/13/17 0301 04/13/17 0353 04/13/17 0500 04/13/17 0559  GLUCAP 127* 126* 120* 139* 159* 144*    Imaging Dg Chest Port 1 View  Result Date: 04/12/2017 CLINICAL DATA:  66 year old male with surgery and follow-up. EXAM: PORTABLE CHEST 1 VIEW COMPARISON:  Chest radiograph dated 06/12/2016 FINDINGS: Endotracheal tube above the carina, right IJ central venous line, partially visualized enteric tube in similar positioning. An interval aortic balloon pump is noted with tip over the descending thoracic aorta at the level of the left hilum. The positioning of the tip of the balloon pump appears slightly lower compared to the prior radiograph. This however may be positional and due to slight tilted positioning of the patient. Attention on follow-up radiograph recommended. There has been interval placement of 80 left IJ Swan-Ganz catheter with tip in the region of the right hilum/ right pulmonary artery. There is persistent perihilar edema with diffuse streaky densities without significant interval change. There is probable small right pleural effusion. The left costophrenic angle has been excluded from the image. There is no pneumothorax. There is stable cardiomegaly. No acute osseous pathology. IMPRESSION: 1. Interval placement of a left IJ Swan-Ganz catheter with tip over the right hilum in the region of the right pulmonary artery. 2. The intra-aortic balloon pump appears slightly lower in position compared to the prior radiograph. However comments may be due to slight tilted positioning of the patient. Attention on follow-up radiograph recommended. Other support devices in similar position. 3. No significant change in the perihilar edema and interstitial densities. Electronically Signed   By: Elgie CollardArash  Radparvar M.D.   On: 04/12/2017 22:17   Dg Chest Port 1 View  Result Date: 04/12/2017 CLINICAL DATA:  Hypoxia EXAM: PORTABLE CHEST 1 VIEW COMPARISON:  April 12, 2017 FINDINGS: Endotracheal tube tip is 3.6 cm  above the carina. Nasogastric tube tip and side port are below the diaphragm. Intra-aortic balloon pump tip is in the proximal descending thoracic aortic region, stable. Central catheter tip is in the superior vena cava. An apparent monitor leads either in or overlying the mid esophagus. No pneumothorax. There is patchy infiltrate in the right upper lobe, less than on study obtained earlier in the day. There is perihilar interstitial edema. There is  cardiomegaly with small left pleural effusion. There is mild pulmonary venous hypertension. No bone lesions. No adenopathy. IMPRESSION: Tube and catheter positions as described without pneumothorax. Less airspace opacity right upper lobe compared to earlier in the day. Persistent perihilar edema remains with small left pleural effusion. Stable cardiomegaly. Electronically Signed   By: Bretta Bang III M.D.   On: 04/12/2017 09:21     STUDIES:  Echo 11/14 > Wall thickness was increased in a pattern of mild LVH. Systolic function was severely reduced. The estimated ejection fraction was in the range of 25% to 30%. Dyskinesis of the mid-apicalanteroseptal and apical myocardium. Features are consistent with a pseudonormal left ventricular filling pattern,   with concomitant abnormal relaxation and increased filling pressure (grade 2 diastolic dysfunction). A moderate pericardial effusion was identified. LHC 11/14 >  100% stenosed mid LAD lesion that was balloon angioplastied with 25% residual stenosis, distal RCA lesion which had DES placed, Ost 1st diag lesion 50% stenosed, proximal ramus 70% stenosed.  IABP was also placed.  CULTURES: Blood 11/13 >  Sputum 11/13 >   ANTIBIOTICS: Cefuroxime 11/14 > Vancomycin 11/14 >  SIGNIFICANT EVENTS: 11/13 > admit.  LINES/TUBES: ETT 11/13 >  R femoral sheath 11/13 >  R IJ CVL (bensimohn) 11/14>>>  DISCUSSION: 66 y.o. male admitted 11/13 with inferolateral STEMI.  He was intubated and was then taken to cath  lab where he was found to have 100% stenosed mid LAD lesion that was balloon angioplastied with 25% residual stenosis, distal RCA lesion which had DES placed, Ost 1st diag lesion 50% stenosed, proximal ramus 70% stenosed.  IABP was also placed. Post cath lab, he remained ventilated, pressor dependent and PCCM was asked to assist.   ASSESSMENT / PLAN:  PULMONARY A: Acute respiratory failure - r/t STEMI ? PNA - bilateral patchiness - favor edema.  P:   Vent support - 8cc/kg  ABG reviewed, no vent changes needed Intermittent CXR Not SBT candidate yet VAP bundle ABX for surg ppx only.   CARDIOVASCULAR A:  Inferolateral STEMI - s/p cath lab where he was found to have 100% stenosed mid LAD lesion that was balloon angioplastied with 25% residual stenosis, distal RCA lesion which had DES placed, Ost 1st diag lesion 50% stenosed, proximal ramus 70% stenosed.  IABP was also placed. Hypotension - suspect sedation related (was normotensive on arrival to ICU but was somewhat agitated so got additional sedation and later became hypotensive). LV rupture - s/p surgical repair 11/14 Hx HTN, CAD. P:  Cardiology and CVTS  following / managing. Epi titrated to MAP goal per CVTS Swan in place, follow hemodynamics.  Anticoagulants per primary IABP to be removed today Impella 5.0  RENAL A:   NAGMA - improving.  AKI - improving.  Good UOP P:   KVO BMP in AM. Replace electrolytes as needed   GASTROINTESTINAL A:   GI prophylaxis. Nutrition. P:   SUP: Pantoprazole. Holding TG for now  HEMATOLOGIC A:   VTE Prophylaxis P:  Per primary  INFECTIOUS A:   Possible PNA P:   Abx as above Follow cultures Trend wbc, fever curve  ENDOCRINE A:   Hyperglycemia P:   Insulin infusion.  NEUROLOGIC A:   Sedation due to mechanical ventilation P:   Sedation: Precedex infusion Will add fentanyl infusion for analgesia RASS goal: 0 to -1 Daily WUA  Family updated: No family present    Interdisciplinary Family Meeting v Palliative Care Meeting:  Due by: 04/17/17  Joneen Roach, AGACNP-BC [place]  Pulmonology/Critical Care Pager [phone] or [phone]  04/13/2017 8:24 AM  STAFF NOTE: Cindi Carbon, MD FACP have personally reviewed patient's available data, including medical history, events of note, physical examination and test results as part of my evaluation. I have discussed with resident/NP and other care providers such as pharmacist, RN and RRT. In addition, I personally evaluated patient and elicited key findings of: calm  On vent, no pain complaints, ETT, perr, nonfocal, FC, sternum wound open dressed vac, lungs CTA anterior, s1 s2 rrr distnt, abdo soft, drains, edema, pcxr whic I reviewed shows pulm edema, impella, swan, ett all wnl, was 6.8 liters positive, epi low dose required, abg reviewed keep same MV, no weaning sbt with cardiogenic shock needs and chest, fortunate to be on min O2 support, and pao2 101 = can reduce O2 to goal 50%, if unable then would tolerate 70% and peep 5 without increasing peep with chest an shock state, so not imperative for neg balance as of now, will need neg balance after cardiogenic shock improves, high risk ATN, crt noted, no role plat transfusion, stable hct , no bleeding, he has done remarkably well, repeat coags in am needed, pct neg overall, expected from sirs alone, no additional ABX required except for post op care per cvts needs with intravascular devices, I updated his sister prior, will repeat abg in am  The patient is critically ill with multiple organ systems failure and requires high complexity decision making for assessment and support, frequent evaluation and titration of therapies, application of advanced monitoring technologies and extensive interpretation of multiple databases.   Critical Care Time devoted to patient care services described in this note is 35 Minutes. This time reflects time of care of this  signee: Rory Percy, MD FACP. This critical care time does not reflect procedure time, or teaching time or supervisory time of PA/NP/Med student/Med Resident etc but could involve care discussion time. Rest per NP/medical resident whose note is outlined above and that I agree with   Mcarthur Rossetti. Tyson Alias, MD, FACP Pgr: [phone] [facility] 04/13/2017 11:36 AM

## 2017-04-14 ENCOUNTER — Inpatient Hospital Stay (HOSPITAL_COMMUNITY): Payer: Medicare Other

## 2017-04-14 ENCOUNTER — Encounter (HOSPITAL_COMMUNITY): Payer: Self-pay | Admitting: Certified Registered Nurse Anesthetist

## 2017-04-14 ENCOUNTER — Encounter (HOSPITAL_COMMUNITY)
Admission: EM | Disposition: A | Discharge status: Rehabilitation Facility | Payer: Self-pay | Source: Home / Self Care | Attending: Cardiothoracic Surgery

## 2017-04-14 ENCOUNTER — Inpatient Hospital Stay (HOSPITAL_COMMUNITY): Payer: Medicare Other | Admitting: Certified Registered Nurse Anesthetist

## 2017-04-14 HISTORY — PX: APPLICATION OF WOUND VAC: SHX5189

## 2017-04-14 HISTORY — PX: TEE WITHOUT CARDIOVERSION: SHX5443

## 2017-04-14 HISTORY — PX: STERNAL CLOSURE: SHX6203

## 2017-04-14 LAB — COMPREHENSIVE METABOLIC PANEL
ALT: 26 U/L (ref 17–63)
AST: 69 U/L — ABNORMAL HIGH (ref 15–41)
Albumin: 2.7 g/dL — ABNORMAL LOW (ref 3.5–5.0)
Alkaline Phosphatase: 40 U/L (ref 38–126)
Anion gap: 7 (ref 5–15)
BUN: 10 mg/dL (ref 6–20)
CO2: 25 mmol/L (ref 22–32)
Calcium: 8.4 mg/dL — ABNORMAL LOW (ref 8.9–10.3)
Chloride: 101 mmol/L (ref 101–111)
Creatinine, Ser: 1.04 mg/dL (ref 0.61–1.24)
GFR calc Af Amer: >60 mL/min (ref >60)
GFR calc non Af Amer: >60 mL/min (ref >60)
Glucose, Bld: 118 mg/dL — ABNORMAL HIGH (ref 65–99)
Potassium: 3.7 mmol/L (ref 3.5–5.1)
Sodium: 133 mmol/L — ABNORMAL LOW (ref 135–145)
Total Bilirubin: 1.2 mg/dL (ref 0.3–1.2)
Total Protein: 4.9 g/dL — ABNORMAL LOW (ref 6.5–8.1)

## 2017-04-14 LAB — POCT I-STAT 3, ART BLOOD GAS (G3+)
ACID-BASE EXCESS: 3 mmol/L — AB (ref 0.0–2.0)
ACID-BASE EXCESS: 3 mmol/L — AB (ref 0.0–2.0)
ACID-BASE EXCESS: 3 mmol/L — AB (ref 0.0–2.0)
ACID-BASE EXCESS: 1 mmol/L (ref 0.0–2.0)
Acid-Base Excess: 3 mmol/L — ABNORMAL HIGH (ref 0.0–2.0)
Acid-Base Excess: 1 mmol/L (ref 0.0–2.0)
BICARBONATE: 27.4 mmol/L (ref 20.0–28.0)
BICARBONATE: 29.4 mmol/L — AB (ref 20.0–28.0)
BICARBONATE: 27.1 mmol/L (ref 20.0–28.0)
BICARBONATE: 28.3 mmol/L — AB (ref 20.0–28.0)
BICARBONATE: 26.5 mmol/L (ref 20.0–28.0)
Bicarbonate: 28.4 mmol/L — ABNORMAL HIGH (ref 20.0–28.0)
O2 SAT: 98 %
O2 Saturation: 96 %
O2 Saturation: 60 %
O2 Saturation: 100 %
O2 Saturation: 100 %
O2 Saturation: 100 %
PCO2 ART: 42.1 mmHg (ref 32.0–48.0)
PCO2 ART: 46.9 mmHg (ref 32.0–48.0)
PCO2 ART: 46.5 mmHg (ref 32.0–48.0)
PCO2 ART: 43.5 mmHg (ref 32.0–48.0)
PH ART: 7.426 (ref 7.350–7.450)
PH ART: 7.421 (ref 7.350–7.450)
PO2 ART: 83 mmHg (ref 83.0–108.0)
PO2 ART: 338 mmHg — AB (ref 83.0–108.0)
Patient temperature: 37.9
TCO2: 29 mmol/L (ref 22–32)
TCO2: 30 mmol/L (ref 22–32)
TCO2: 31 mmol/L (ref 22–32)
TCO2: 28 mmol/L (ref 22–32)
TCO2: 30 mmol/L (ref 22–32)
TCO2: 28 mmol/L (ref 22–32)
pCO2 arterial: 52.1 mmHg — ABNORMAL HIGH (ref 32.0–48.0)
pCO2 arterial: 45.6 mmHg (ref 32.0–48.0)
pH, Arterial: 7.36 (ref 7.350–7.450)
pH, Arterial: 7.39 (ref 7.350–7.450)
pH, Arterial: 7.373 (ref 7.350–7.450)
pH, Arterial: 7.372 (ref 7.350–7.450)
pO2, Arterial: 115 mmHg — ABNORMAL HIGH (ref 83.0–108.0)
pO2, Arterial: 33 mmHg — CL (ref 83.0–108.0)
pO2, Arterial: 280 mmHg — ABNORMAL HIGH (ref 83.0–108.0)
pO2, Arterial: 174 mmHg — ABNORMAL HIGH (ref 83.0–108.0)

## 2017-04-14 LAB — POCT I-STAT, CHEM 8
BUN: 6 mg/dL (ref 6–20)
BUN: 5 mg/dL — ABNORMAL LOW (ref 6–20)
BUN: 7 mg/dL (ref 6–20)
BUN: 6 mg/dL (ref 6–20)
BUN: 6 mg/dL (ref 6–20)
CALCIUM ION: 1.22 mmol/L (ref 1.15–1.40)
CALCIUM ION: 0.94 mmol/L — AB (ref 1.15–1.40)
CHLORIDE: 102 mmol/L (ref 101–111)
CHLORIDE: 104 mmol/L (ref 101–111)
CREATININE: 0.6 mg/dL — AB (ref 0.61–1.24)
Calcium, Ion: 1.24 mmol/L (ref 1.15–1.40)
Calcium, Ion: 1.14 mmol/L — ABNORMAL LOW (ref 1.15–1.40)
Calcium, Ion: 1.08 mmol/L — ABNORMAL LOW (ref 1.15–1.40)
Chloride: 104 mmol/L (ref 101–111)
Chloride: 104 mmol/L (ref 101–111)
Chloride: 103 mmol/L (ref 101–111)
Creatinine, Ser: 0.6 mg/dL — ABNORMAL LOW (ref 0.61–1.24)
Creatinine, Ser: 0.5 mg/dL — ABNORMAL LOW (ref 0.61–1.24)
Creatinine, Ser: 0.7 mg/dL (ref 0.61–1.24)
Creatinine, Ser: 0.6 mg/dL — ABNORMAL LOW (ref 0.61–1.24)
GLUCOSE: 142 mg/dL — AB (ref 65–99)
Glucose, Bld: 181 mg/dL — ABNORMAL HIGH (ref 65–99)
Glucose, Bld: 130 mg/dL — ABNORMAL HIGH (ref 65–99)
Glucose, Bld: 159 mg/dL — ABNORMAL HIGH (ref 65–99)
Glucose, Bld: 150 mg/dL — ABNORMAL HIGH (ref 65–99)
HCT: 36 % — ABNORMAL LOW (ref 39.0–52.0)
HCT: 26 % — ABNORMAL LOW (ref 39.0–52.0)
HCT: 30 % — ABNORMAL LOW (ref 39.0–52.0)
HEMATOCRIT: 34 % — AB (ref 39.0–52.0)
HEMATOCRIT: 28 % — AB (ref 39.0–52.0)
HEMOGLOBIN: 12.2 g/dL — AB (ref 13.0–17.0)
HEMOGLOBIN: 11.6 g/dL — AB (ref 13.0–17.0)
HEMOGLOBIN: 8.8 g/dL — AB (ref 13.0–17.0)
HEMOGLOBIN: 9.5 g/dL — AB (ref 13.0–17.0)
Hemoglobin: 10.2 g/dL — ABNORMAL LOW (ref 13.0–17.0)
POTASSIUM: 3.8 mmol/L (ref 3.5–5.1)
POTASSIUM: 3.8 mmol/L (ref 3.5–5.1)
POTASSIUM: 4.4 mmol/L (ref 3.5–5.1)
Potassium: 3.7 mmol/L (ref 3.5–5.1)
Potassium: 4.2 mmol/L (ref 3.5–5.1)
SODIUM: 141 mmol/L (ref 135–145)
SODIUM: 140 mmol/L (ref 135–145)
SODIUM: 139 mmol/L (ref 135–145)
Sodium: 140 mmol/L (ref 135–145)
Sodium: 138 mmol/L (ref 135–145)
TCO2: 26 mmol/L (ref 22–32)
TCO2: 27 mmol/L (ref 22–32)
TCO2: 29 mmol/L (ref 22–32)
TCO2: 28 mmol/L (ref 22–32)
TCO2: 25 mmol/L (ref 22–32)

## 2017-04-14 LAB — BASIC METABOLIC PANEL
Anion gap: 4 — ABNORMAL LOW (ref 5–15)
BUN: 11 mg/dL (ref 6–20)
CO2: 29 mmol/L (ref 22–32)
Calcium: 8.4 mg/dL — ABNORMAL LOW (ref 8.9–10.3)
Chloride: 99 mmol/L — ABNORMAL LOW (ref 101–111)
Creatinine, Ser: 0.88 mg/dL (ref 0.61–1.24)
GFR calc Af Amer: >60 mL/min (ref >60)
GFR calc non Af Amer: >60 mL/min (ref >60)
Glucose, Bld: 116 mg/dL — ABNORMAL HIGH (ref 65–99)
Potassium: 3.3 mmol/L — ABNORMAL LOW (ref 3.5–5.1)
Sodium: 132 mmol/L — ABNORMAL LOW (ref 135–145)

## 2017-04-14 LAB — CBC
HCT: 37.1 % — ABNORMAL LOW (ref 39.0–52.0)
HCT: 34.9 % — ABNORMAL LOW (ref 39.0–52.0)
Hemoglobin: 12.1 g/dL — ABNORMAL LOW (ref 13.0–17.0)
Hemoglobin: 11.4 g/dL — ABNORMAL LOW (ref 13.0–17.0)
MCH: 28.4 pg (ref 26.0–34.0)
MCH: 28.6 pg (ref 26.0–34.0)
MCHC: 32.6 g/dL (ref 30.0–36.0)
MCHC: 32.7 g/dL (ref 30.0–36.0)
MCV: 87.1 fL (ref 78.0–100.0)
MCV: 87.5 fL (ref 78.0–100.0)
Platelets: 80 10*3/uL — ABNORMAL LOW (ref 150–400)
Platelets: 76 10*3/uL — ABNORMAL LOW (ref 150–400)
RBC: 4.26 MIL/uL (ref 4.22–5.81)
RBC: 3.99 MIL/uL — ABNORMAL LOW (ref 4.22–5.81)
RDW: 14.7 % (ref 11.5–15.5)
RDW: 14.5 % (ref 11.5–15.5)
WBC: 18.3 10*3/uL — ABNORMAL HIGH (ref 4.0–10.5)
WBC: 17.9 10*3/uL — ABNORMAL HIGH (ref 4.0–10.5)

## 2017-04-14 LAB — GLUCOSE, CAPILLARY
GLUCOSE-CAPILLARY: 104 mg/dL — AB (ref 65–99)
GLUCOSE-CAPILLARY: 104 mg/dL — AB (ref 65–99)
GLUCOSE-CAPILLARY: 110 mg/dL — AB (ref 65–99)
GLUCOSE-CAPILLARY: 127 mg/dL — AB (ref 65–99)
GLUCOSE-CAPILLARY: 130 mg/dL — AB (ref 65–99)
Glucose-Capillary: 106 mg/dL — ABNORMAL HIGH (ref 65–99)
Glucose-Capillary: 124 mg/dL — ABNORMAL HIGH (ref 65–99)

## 2017-04-14 LAB — COOXEMETRY PANEL
Carboxyhemoglobin: 1.4 % (ref 0.5–1.5)
Methemoglobin: 1.1 % (ref 0.0–1.5)
O2 Saturation: 67 %
Total hemoglobin: 12.1 g/dL (ref 12.0–16.0)

## 2017-04-14 LAB — APTT: APTT: 57 s — AB (ref 24–36)

## 2017-04-14 LAB — POCT ACTIVATED CLOTTING TIME: ACTIVATED CLOTTING TIME: 142 s

## 2017-04-14 LAB — HEPARIN INDUCED PLATELET AB (HIT ANTIBODY): Heparin Induced Plt Ab: 0.159 OD (ref 0.000–0.400)

## 2017-04-14 LAB — LACTATE DEHYDROGENASE: LDH: 346 U/L — ABNORMAL HIGH (ref 98–192)

## 2017-04-14 SURGERY — CLOSURE, STERNUM
Anesthesia: General | Site: Chest

## 2017-04-14 MED ORDER — VANCOMYCIN HCL IN DEXTROSE 1-5 GM/200ML-% IV SOLN
1000.0000 mg | INTRAVENOUS | Status: AC
  Administered 2017-04-14: 1000 mg via INTRAVENOUS
  Filled 2017-04-14: qty 200

## 2017-04-14 MED ORDER — ACETAMINOPHEN 10 MG/ML IV SOLN
1000.0000 mg | Freq: Once | INTRAVENOUS | Status: AC
  Administered 2017-04-14: 1000 mg via INTRAVENOUS
  Filled 2017-04-14: qty 100

## 2017-04-14 MED ORDER — HEMOSTATIC AGENTS (NO CHARGE) OPTIME
TOPICAL | Status: DC | PRN
  Administered 2017-04-14: 1 via TOPICAL

## 2017-04-14 MED ORDER — SODIUM CHLORIDE 0.9 % IJ SOLN
OROMUCOSAL | Status: DC | PRN
  Administered 2017-04-14: 4 mL via TOPICAL

## 2017-04-14 MED ORDER — ALBUMIN HUMAN 5 % IV SOLN
INTRAVENOUS | Status: DC | PRN
  Administered 2017-04-14: 09:00:00 via INTRAVENOUS

## 2017-04-14 MED ORDER — SODIUM CHLORIDE 0.9 % IV SOLN
0.7500 ug/kg/min | INTRAVENOUS | Status: DC
  Administered 2017-04-14 – 2017-04-17 (×6): 0.75 ug/kg/min via INTRAVENOUS
  Filled 2017-04-14 (×7): qty 50

## 2017-04-14 MED ORDER — MIDAZOLAM HCL 2 MG/2ML IJ SOLN
INTRAMUSCULAR | Status: AC
  Filled 2017-04-14: qty 2

## 2017-04-14 MED ORDER — SODIUM CHLORIDE 0.9 % IR SOLN
Status: DC | PRN
  Administered 2017-04-14: 2000 mL

## 2017-04-14 MED ORDER — PROPOFOL 10 MG/ML IV BOLUS
INTRAVENOUS | Status: AC
  Filled 2017-04-14: qty 20

## 2017-04-14 MED ORDER — ROCURONIUM BROMIDE 10 MG/ML (PF) SYRINGE
PREFILLED_SYRINGE | INTRAVENOUS | Status: AC
  Filled 2017-04-14: qty 10

## 2017-04-14 MED ORDER — SODIUM CHLORIDE 0.9% FLUSH: 10.0000 mL | INTRAVENOUS | Status: DC | PRN

## 2017-04-14 MED ORDER — POTASSIUM CHLORIDE 10 MEQ/50ML IV SOLN
INTRAVENOUS | Status: AC
  Filled 2017-04-14: qty 50

## 2017-04-14 MED ORDER — VANCOMYCIN HCL IN DEXTROSE 1-5 GM/200ML-% IV SOLN
1000.0000 mg | Freq: Two times a day (BID) | INTRAVENOUS | Status: DC
  Administered 2017-04-14 – 2017-04-15 (×2): 1000 mg via INTRAVENOUS
  Filled 2017-04-14 (×3): qty 200

## 2017-04-14 MED ORDER — HEPARIN (PORCINE) IN NACL 100-0.45 UNIT/ML-% IJ SOLN: 200.0000 [IU]/h | INTRAMUSCULAR | Status: DC

## 2017-04-14 MED ORDER — POTASSIUM CHLORIDE 10 MEQ/50ML IV SOLN
10.0000 meq | INTRAVENOUS | Status: AC
  Administered 2017-04-14 (×3): 10 meq via INTRAVENOUS
  Filled 2017-04-14: qty 50

## 2017-04-14 MED ORDER — MIDAZOLAM HCL 10 MG/2ML IJ SOLN
INTRAMUSCULAR | Status: AC
  Filled 2017-04-14: qty 2

## 2017-04-14 MED ORDER — SODIUM CHLORIDE 0.9% FLUSH
10.0000 mL | Freq: Two times a day (BID) | INTRAVENOUS | Status: DC
  Administered 2017-04-15 – 2017-04-26 (×14): 10 mL

## 2017-04-14 MED ORDER — MIDAZOLAM HCL 5 MG/5ML IJ SOLN
INTRAMUSCULAR | Status: DC | PRN
  Administered 2017-04-14: 3 mg via INTRAVENOUS

## 2017-04-14 MED ORDER — FENTANYL CITRATE (PF) 250 MCG/5ML IJ SOLN
INTRAMUSCULAR | Status: AC
  Filled 2017-04-14: qty 5

## 2017-04-14 MED ORDER — LACTATED RINGERS IV SOLN
INTRAVENOUS | Status: DC | PRN
  Administered 2017-04-14: 08:00:00 via INTRAVENOUS

## 2017-04-14 MED ORDER — POTASSIUM CHLORIDE 10 MEQ/50ML IV SOLN
10.0000 meq | INTRAVENOUS | Status: AC
  Administered 2017-04-14 (×2): 10 meq via INTRAVENOUS
  Filled 2017-04-14 (×2): qty 50

## 2017-04-14 MED ORDER — ETOMIDATE 2 MG/ML IV SOLN
INTRAVENOUS | Status: AC
  Filled 2017-04-14: qty 10

## 2017-04-14 MED ORDER — THROMBIN (RECOMBINANT) 5000 UNITS EX SOLR
CUTANEOUS | Status: DC | PRN
  Administered 2017-04-14: 5000 [IU] via TOPICAL

## 2017-04-14 MED ORDER — CHLORHEXIDINE GLUCONATE CLOTH 2 % EX PADS
6.0000 | MEDICATED_PAD | Freq: Every day | CUTANEOUS | Status: DC
  Administered 2017-04-15 – 2017-04-27 (×13): 6 via TOPICAL

## 2017-04-14 MED ORDER — SODIUM CHLORIDE 0.9 % IJ SOLN
INTRAMUSCULAR | Status: AC
  Filled 2017-04-14: qty 10

## 2017-04-14 MED ORDER — HEPARIN SODIUM (PORCINE) 5000 UNIT/ML IJ SOLN
50000.0000 [IU] | INTRAVENOUS | Status: DC
  Administered 2017-04-14: 50000 [IU]
  Filled 2017-04-14: qty 10

## 2017-04-14 MED ORDER — ROCURONIUM BROMIDE 10 MG/ML (PF) SYRINGE
PREFILLED_SYRINGE | INTRAVENOUS | Status: DC | PRN
  Administered 2017-04-14 (×3): 50 mg via INTRAVENOUS

## 2017-04-14 MED ORDER — FENTANYL CITRATE (PF) 100 MCG/2ML IJ SOLN
INTRAMUSCULAR | Status: DC | PRN
Start: 2017-04-14 — End: 2017-04-14
  Administered 2017-04-14: 50 ug via INTRAVENOUS
  Administered 2017-04-14: 100 ug via INTRAVENOUS
  Administered 2017-04-14 (×2): 50 ug via INTRAVENOUS

## 2017-04-14 SURGICAL SUPPLY — 72 items
ATTRACTOMAT 16X20 MAGNETIC DRP (DRAPES) ×2 IMPLANT
BAG DECANTER FOR FLEXI CONT (MISCELLANEOUS) ×2 IMPLANT
BAG URIMETER BARDEX IC 350 (UROLOGICAL SUPPLIES) IMPLANT
BENZOIN TINCTURE PRP APPL 2/3 (GAUZE/BANDAGES/DRESSINGS) IMPLANT
BLADE SURG 10 STRL SS (BLADE) ×4 IMPLANT
BNDG GAUZE ELAST 4 BULKY (GAUZE/BANDAGES/DRESSINGS) IMPLANT
CANISTER SUCT 3000ML PPV (MISCELLANEOUS) ×2 IMPLANT
CATH FOLEY 2WAY SLVR  5CC 16FR (CATHETERS)
CATH FOLEY 2WAY SLVR 5CC 16FR (CATHETERS) IMPLANT
CATH THORACIC 28FR RT ANG (CATHETERS) IMPLANT
CATH THORACIC 36FR (CATHETERS) IMPLANT
CATH THORACIC 36FR RT ANG (CATHETERS) IMPLANT
CHLORAPREP W/TINT 10.5 ML (MISCELLANEOUS) ×2 IMPLANT
CLIP VESOCCLUDE MED 24/CT (CLIP) ×2 IMPLANT
CLIP VESOCCLUDE SM WIDE 24/CT (CLIP) ×2 IMPLANT
CONN ST 1/4X3/8  BEN (MISCELLANEOUS) ×2
CONN ST 1/4X3/8 BEN (MISCELLANEOUS) ×2 IMPLANT
CONN Y 3/8X3/8X3/8  BEN (MISCELLANEOUS) ×1
CONN Y 3/8X3/8X3/8 BEN (MISCELLANEOUS) ×1 IMPLANT
CONT SPEC 4OZ CLIKSEAL STRL BL (MISCELLANEOUS) IMPLANT
DRAIN CHANNEL 28F RND 3/8 FF (WOUND CARE) ×4 IMPLANT
DRAPE LAPAROSCOPIC ABDOMINAL (DRAPES) ×2 IMPLANT
DRAPE WARM FLUID 44X44 (DRAPE) IMPLANT
DRSG AQUACEL AG ADV 3.5X14 (GAUZE/BANDAGES/DRESSINGS) ×2 IMPLANT
DRSG PAD ABDOMINAL 8X10 ST (GAUZE/BANDAGES/DRESSINGS) ×6 IMPLANT
ELECT BLADE 4.0 EZ CLEAN MEGAD (MISCELLANEOUS) ×2
ELECT REM PT RETURN 9FT ADLT (ELECTROSURGICAL) ×2
ELECTRODE BLDE 4.0 EZ CLN MEGD (MISCELLANEOUS) ×1 IMPLANT
ELECTRODE REM PT RTRN 9FT ADLT (ELECTROSURGICAL) ×1 IMPLANT
GAUZE SPONGE 4X4 12PLY STRL (GAUZE/BANDAGES/DRESSINGS) ×2 IMPLANT
GAUZE XEROFORM 5X9 LF (GAUZE/BANDAGES/DRESSINGS) ×2 IMPLANT
GLOVE BIO SURGEON STRL SZ 6.5 (GLOVE) ×12 IMPLANT
GOWN STRL REUS W/ TWL LRG LVL3 (GOWN DISPOSABLE) ×6 IMPLANT
GOWN STRL REUS W/TWL LRG LVL3 (GOWN DISPOSABLE) ×6
HANDPIECE INTERPULSE COAX TIP (DISPOSABLE)
HEMOSTAT POWDER SURGIFOAM 1G (HEMOSTASIS) IMPLANT
HEMOSTAT SURGICEL 2X14 (HEMOSTASIS) IMPLANT
KIT BASIN OR (CUSTOM PROCEDURE TRAY) ×2 IMPLANT
KIT PREVENA INCISION MGT20CM45 (CANNISTER) ×2 IMPLANT
KIT ROOM TURNOVER OR (KITS) ×2 IMPLANT
KIT SUCTION CATH 14FR (SUCTIONS) IMPLANT
MARKER SKIN DUAL TIP RULER LAB (MISCELLANEOUS) IMPLANT
NS IRRIG 1000ML POUR BTL (IV SOLUTION) ×4 IMPLANT
PACK CHEST (CUSTOM PROCEDURE TRAY) ×2 IMPLANT
PAD ARMBOARD 7.5X6 YLW CONV (MISCELLANEOUS) ×4 IMPLANT
PIN SAFETY STERILE (MISCELLANEOUS) IMPLANT
RUBBERBAND STERILE (MISCELLANEOUS) IMPLANT
SET HNDPC FAN SPRY TIP SCT (DISPOSABLE) IMPLANT
SOL PREP POV-IOD 4OZ 10% (MISCELLANEOUS) IMPLANT
SPONGE LAP 18X18 X RAY DECT (DISPOSABLE) ×4 IMPLANT
STAPLER VISISTAT 35W (STAPLE) IMPLANT
STRAP MONTGOMERY 1.25X11-1/8 (MISCELLANEOUS) IMPLANT
SUT ETHILON 3 0 FSL (SUTURE) ×4 IMPLANT
SUT SILK 2 0 SH CR/8 (SUTURE) ×4 IMPLANT
SUT STEEL 6MS V (SUTURE) IMPLANT
SUT STEEL STERNAL CCS#1 18IN (SUTURE) IMPLANT
SUT STEEL SZ 6 DBL 3X14 BALL (SUTURE) IMPLANT
SUT VIC AB 1 CTX 18 (SUTURE) ×6 IMPLANT
SUT VIC AB 1 CTX 36 (SUTURE)
SUT VIC AB 1 CTX36XBRD ANBCTR (SUTURE) IMPLANT
SUT VIC AB 2-0 CTX 27 (SUTURE) ×4 IMPLANT
SUT VIC AB 3-0 X1 27 (SUTURE) ×4 IMPLANT
SWAB COLLECTION DEVICE MRSA (MISCELLANEOUS) IMPLANT
SWAB CULTURE ESWAB REG 1ML (MISCELLANEOUS) IMPLANT
SYR 5ML LL (SYRINGE) IMPLANT
SYSTEM SAHARA CHEST DRAIN ATS (WOUND CARE) ×2 IMPLANT
TAPE CLOTH SURG 4X10 WHT LF (GAUZE/BANDAGES/DRESSINGS) ×2 IMPLANT
TAPE PAPER 2X10 WHT MICROPORE (GAUZE/BANDAGES/DRESSINGS) ×4 IMPLANT
TOWEL OR 17X24 6PK STRL BLUE (TOWEL DISPOSABLE) ×2 IMPLANT
TOWEL OR 17X26 10 PK STRL BLUE (TOWEL DISPOSABLE) ×2 IMPLANT
WATER STERILE IRR 1000ML POUR (IV SOLUTION) ×2 IMPLANT
WND VAC CANISTER 500ML (MISCELLANEOUS) ×2 IMPLANT

## 2017-04-14 NOTE — Progress Notes (Signed)
Pharmacy Antibiotic Note  [name] is a 66 y.o. male admitted on 04/11/2017 with surgical prophylaxis.  Pharmacy has been consulted for cefuroxime and vancomycin dosing.  Asked by MD to continue for 24 hrs post op.    Plan: 1. Continue cefuroxime 1.5g q 12 hrs through tomorrow. 2. Continue vancomycin 1g IV q 12 hrs through tomorrow.  Height: 5\' 7"  (170.2 cm) Weight: 216 lb 7.9 oz (98.2 kg) IBW/kg (Calculated) : 66.1  Temp (24hrs), Avg:99.6 F (37.6 C), Min:98.4 F (36.9 C), Max:100.8 F (38.2 C)  Recent Labs  Lab 04/12/17 0605 04/12/17 1254  04/12/17 1922 04/12/17 2147 04/13/17 0348 04/13/17 0832 04/13/17 1207 04/13/17 1609 04/13/17 1615 04/14/17 0325  WBC 12.6*  --   --   --  12.1* 14.7*  --   --   --  17.5* 18.3*  CREATININE 1.42*  --    < > 0.80  --  1.06  --   --  0.90 0.90 1.04  LATICACIDVEN  --  0.9  --   --   --   --  2.4* 1.6  --   --   --    < > = values in this interval not displayed.    Estimated Creatinine Clearance: 75.9 mL/min (by C-G formula based on SCr of 1.04 mg/dL).    No Known Allergies   Thank you for allowing pharmacy to be a part of this patient's care.  Tad MooreJessica Nikiah Goin, Pharm D, BCPS  Clinical Pharmacist Pager [phone]  04/14/2017 12:49 PM

## 2017-04-14 NOTE — Anesthesia Preprocedure Evaluation (Signed)
Anesthesia Evaluation  Patient identified by MRN, date of birth, ID band Patient awake    Reviewed: Allergy & Precautions, NPO status , Patient's Chart, lab work & pertinent test results  Airway Mallampati: Intubated       Dental   Pulmonary Current Smoker,     + decreased breath sounds      Cardiovascular hypertension,  Rhythm:Regular Rate:Normal     Neuro/Psych    GI/Hepatic   Endo/Other    Renal/GU      Musculoskeletal   Abdominal   Peds  Hematology   Anesthesia Other Findings   Reproductive/Obstetrics                             Anesthesia Physical Anesthesia Plan  ASA: III  Anesthesia Plan: General   Post-op Pain Management:    Induction: Intravenous  PONV Risk Score and Plan: Ondansetron  Airway Management Planned: Oral ETT  Additional Equipment: Arterial line, PA Cath, CVP and 3D TEE  Intra-op Plan:   Post-operative Plan: Post-operative intubation/ventilation  Informed Consent: I have reviewed the patients History and Physical, chart, labs and discussed the procedure including the risks, benefits and alternatives for the proposed anesthesia with the patient or authorized representative who has indicated his/her understanding and acceptance.     Plan Discussed with: CRNA and Anesthesiologist  Anesthesia Plan Comments:         Anesthesia Quick Evaluation

## 2017-04-14 NOTE — Progress Notes (Signed)
CT surgery p.m. Rounds  Patient remains hemodynamically stable after sternal closure today Urine output satisfactory VAD flow remains 3.7-3.8 L/min Direct thrombin inhibitor started and heparin stopped over concerns of  HIT

## 2017-04-14 NOTE — Progress Notes (Signed)
Patient just returned from OR and is still paralyzed.

## 2017-04-14 NOTE — Anesthesia Postprocedure Evaluation (Signed)
Anesthesia Post Note  Patient: [name]  Procedure(s) Performed: STERNAL CLOSURE (N/A Chest) TRANSESOPHAGEAL ECHOCARDIOGRAM (TEE) (N/A Chest) APPLICATION OF WOUND VAC with reposition of Impella device (N/A Chest)     Patient location during evaluation: SICU Anesthesia Type: General Level of consciousness: sedated and patient remains intubated per anesthesia plan Vital Signs Assessment: post-procedure vital signs reviewed and stable Respiratory status: patient on ventilator - see flowsheet for VS and patient remains intubated per anesthesia plan Cardiovascular status: blood pressure returned to baseline Anesthetic complications: no    Last Vitals:  Vitals:   04/14/17 1647 04/14/17 1700  BP: 96/82   Pulse: 88 88  Resp: (!) 24 (!) 24  Temp: (!) 38.2 C (!) 38.2 C  SpO2: 99% 99%    Last Pain:  Vitals:   04/14/17 1200  TempSrc: Core  PainSc:                  Shanta Hartner COKER

## 2017-04-14 NOTE — Anesthesia Procedure Notes (Addendum)
Central Venous Catheter Insertion Performed by: Arta Brucessey, Amrie Gurganus, MD, anesthesiologist Start/End11/14/2018 2:30 PM, 04/12/2017 2:40 PM Patient location: OR. Preanesthetic checklist: patient identified, IV checked, risks and benefits discussed, surgical consent, monitors and equipment checked, pre-op evaluation, timeout performed and anesthesia consent Position: Trendelenburg Patient sedated Hand hygiene performed , maximum sterile barriers used  and Seldinger technique used Catheter size: 8.5 Fr PA cath was placed.Sheath introducer Swan type:thermodilution PA Cath depth:48 Procedure performed using ultrasound guided technique. Ultrasound Notes:anatomy identified, needle tip was noted to be adjacent to the nerve/plexus identified and no ultrasound evidence of intravascular and/or intraneural injection Attempts: 1 Following insertion, line sutured and dressing applied. Post procedure assessment: blood return through all ports, free fluid flow and no air  Patient tolerated the procedure well with no immediate complications.

## 2017-04-14 NOTE — Anesthesia Preprocedure Evaluation (Signed)
Anesthesia Evaluation  Patient identified by MRN, date of birth, ID band Patient unresponsive    Reviewed: Allergy & Precautions, Unable to perform ROS - Chart review onlyPreop documentation limited or incomplete due to emergent nature of procedure.  Airway Mallampati: Intubated       Dental   Pulmonary COPD, Current Smoker,    Pulmonary exam normal        Cardiovascular hypertension, Pt. on medications + CAD and + Past MI  Normal cardiovascular exam     Neuro/Psych    GI/Hepatic   Endo/Other    Renal/GU      Musculoskeletal   Abdominal   Peds  Hematology   Anesthesia Other Findings   Reproductive/Obstetrics                             Anesthesia Physical Anesthesia Plan  ASA: IV  Anesthesia Plan: General   Post-op Pain Management:    Induction: Intravenous  PONV Risk Score and Plan: 1 and Treatment may vary due to age or medical condition  Airway Management Planned: Oral ETT  Additional Equipment: Arterial line, PA Cath, Ultrasound Guidance Line Placement and TEE  Intra-op Plan:   Post-operative Plan: Post-operative intubation/ventilation  Informed Consent: I have reviewed the patients History and Physical, chart, labs and discussed the procedure including the risks, benefits and alternatives for the proposed anesthesia with the patient or authorized representative who has indicated his/her understanding and acceptance.     Plan Discussed with: CRNA and Surgeon  Anesthesia Plan Comments:         Anesthesia Quick Evaluation

## 2017-04-14 NOTE — Anesthesia Procedure Notes (Signed)
Date/Time: 04/14/2017 7:48 AM Performed by: Waynard EdwardsSmith, Niomie Englert A, CRNA Pre-anesthesia Checklist: Patient identified, Emergency Drugs available, Suction available and Patient being monitored Patient Re-evaluated:Patient Re-evaluated prior to induction Oxygen Delivery Method: Ambu bag Preoxygenation: Pre-oxygenation with 100% oxygen Induction Type: Inhalational induction with existing ETT Placement Confirmation: positive ETCO2 and breath sounds checked- equal and bilateral

## 2017-04-14 NOTE — Brief Op Note (Signed)
04/11/2017 - 04/14/2017  9:41 AM  PATIENT:  Arlana HoveGary Almanzar  66 y.o. male  PRE-OPERATIVE DIAGNOSIS:  s/p repair LV rupture with open chest  POST-OPERATIVE DIAGNOSIS:  s/p repair LV rupture with open chest  PROCEDURE:  Procedure(s): STERNAL CLOSURE (N/A) TRANSESOPHAGEAL ECHOCARDIOGRAM (TEE) (N/A) APPLICATION OF WOUND VAC with reposition of Impella device (N/A)  SURGEON:  Surgeon(s) and Role:    * Delight OvensGerhardt, Dayon Witt B, MD - Primary  PHYSICIAN ASSISTANT: D Zimmerman   ANESTHESIA:   general  EBL:  50 mL   BLOOD ADMINISTERED:none  DRAINS: one 28 mediastinal tube   LOCAL MEDICATIONS USED:  NONE  SPECIMEN:  No Specimen  DISPOSITION OF SPECIMEN:  N/A  COUNTS:  YES  TOURNIQUET:  * No tourniquets in log *  DICTATION: .Dragon Dictation  PLAN OF CARE: inpatient   PATIENT DISPOSITION:  ICU - intubated and critically ill.   Delay start of Pharmacological VTE agent (>24hrs) due to surgical blood loss or risk of bleeding: yes

## 2017-04-14 NOTE — Progress Notes (Signed)
  Echocardiogram Echocardiogram Transesophageal has been performed.  [name]  [name] 04/14/2017, 9:57 AM

## 2017-04-14 NOTE — Progress Notes (Signed)
Advanced Heart Failure Rounding Note   Subjective:    -S/P LV pseudoaneurysm repeat 11/14 Impella 5.0 Placed.  - Chest closed 11/16.   Remains intubated. Impella 5.0 on P7 with 3.7 flow. Currently on amio 30 mg per hour, neo 15 mcg, epi 2 mcg, and lasix drip 5 mg per hour. chect remains open. IABP weaning with plan to remove this morning.   Impella flow 3.7L on P-7   Swan #s CVP 6  PAP 29/14  CO 4.9  CI 2.3   Objective:   Weight Range: 216 lb 7.9 oz (98.2 kg) Body mass index is 33.91 kg/m.   Vital Signs:   Temp:  [98.4 F (36.9 C)-100.8 F (38.2 C)] 100.4 F (38 C) (11/16 1530) Pulse Rate:  [57-97] 88 (11/16 1530) Resp:  [0-24] 24 (11/16 1530) BP: (93-106)/(71-87) 98/81 (11/16 1500) SpO2:  [97 %-100 %] 100 % (11/16 1530) Arterial Line BP: (88-163)/(57-101) 97/67 (11/16 1530) FiO2 (%):  [50 %-70 %] 50 % (11/16 1200) Weight:  [216 lb 7.9 oz (98.2 kg)] 216 lb 7.9 oz (98.2 kg) (11/16 0400) Last BM Date: (PTA)  Weight change: Filed Weights   04/12/17 0615 04/13/17 0440 04/14/17 0400  Weight: 200 lb 6.4 oz (90.9 kg) 219 lb 5.7 oz (99.5 kg) 216 lb 7.9 oz (98.2 kg)    Intake/Output:   Intake/Output Summary (Last 24 hours) at 04/14/2017 1545 Last data filed at 04/14/2017 1500 Gross per 24 hour  Intake 5399.46 ml  Output 7183 ml  Net -1783.54 ml      Physical Exam  CVP 6  General:  Intubated .  HEENT: normal LIJ swan RIJ  Neck: supple. no JVD. Carotids 2+ bilat; no bruits. No lymphadenopathy or thryomegaly appreciated. Cor: PMI nondisplaced. Regular rate & rhythm. No rubs, gallops or murmurs. Stetnal dressing.  Lungs: clear Abdomen: soft, nontender, nondistended. No hepatosplenomegaly. No bruits or masses. Good bowel sounds. Extremities: no cyanosis, clubbing, rash, edema Neuro: intubated.  Telemetry  A paced 80s   EKG  N/A  Labs    CBC Recent Labs    04/11/17 2138  04/13/17 1615 04/13/17 2307 04/14/17 0325  WBC 13.9*   < > 17.5*  --  18.3*   NEUTROABS 7.2  --   --   --   --   HGB 14.5   < > 12.2* 11.6* 12.1*  HCT 46.7   < > 37.3* 34.0* 37.1*  MCV 93.0   < > 87.1  --  87.1  PLT 235   < > 85*  --  80*   < > = values in this interval not displayed.   Basic Metabolic Panel Recent Labs    04/12/17 1248  04/13/17 0348  04/13/17 1609 04/13/17 1615 04/13/17 2307 04/14/17 0325  NA  --    < > 136   < > 139  --  139 133*  K  --    < > 4.6   < > 3.8  --  3.3* 3.7  CL  --    < > 109  --  102  --   --  101  CO2  --   --  22  --   --   --   --  25  GLUCOSE  --    < > 155*   < > 116*  --  111* 118*  BUN  --    < > 12  --  12  --   --  10  CREATININE  --    < > 1.06  --  0.90 0.90  --  1.04  CALCIUM  --   --  8.9  --   --   --   --  8.4*  MG 1.6*  --  2.3  --   --  1.8  --   --   PHOS 2.8  --   --   --   --   --   --   --    < > = values in this interval not displayed.   Liver Function Tests Recent Labs    04/12/17 0057 04/14/17 0325  AST 39 69*  ALT 27 26  ALKPHOS 75 40  BILITOT 0.7 1.2  PROT 6.3* 4.9*  ALBUMIN 3.0* 2.7*   No results for input(s): LIPASE, AMYLASE in the last 72 hours. Cardiac Enzymes Recent Labs    04/12/17 0057 04/12/17 0605 04/12/17 1248  TROPONINI 0.11* 0.18* 0.29*    BNP: BNP (last 3 results) No results for input(s): BNP in the last 8760 hours.  ProBNP (last 3 results) No results for input(s): PROBNP in the last 8760 hours.   D-Dimer No results for input(s): DDIMER in the last 72 hours. Hemoglobin A1C No results for input(s): HGBA1C in the last 72 hours. Fasting Lipid Panel Recent Labs    04/11/17 2138  CHOL 167  HDL 27*  LDLCALC 118*  TRIG 109  CHOLHDL 6.2   Thyroid Function Tests No results for input(s): TSH, T4TOTAL, T3FREE, THYROIDAB in the last 72 hours.  Invalid input(s): FREET3  Other results:   Imaging    Dg Chest Port 1 View  Result Date: 04/14/2017 CLINICAL DATA:  Hypoxia EXAM: PORTABLE CHEST 1 VIEW COMPARISON:  April 13, 2017 FINDINGS: Endotracheal  tube tip is 3.7 cm above the carina. Nasogastric tube tip and side port are below the diaphragm. Central catheter tip is in the right main pulmonary artery. There is a a right jugular catheter with tip in superior vena cava. No pneumothorax. There are pleural effusions bilaterally with bibasilar atelectasis. Heart is enlarged with mild pulmonary venous hypertension. No adenopathy. No bone lesions. IMPRESSION: Tube and catheter positions as described without evident pneumothorax. Stable cardiomegaly with mild pulmonary vascular congestion. Pleural effusions with bibasilar atelectasis appear stable. Electronically Signed   By: Lowella Grip III M.D.   On: 04/14/2017 08:26   Dg Abd Portable 1v  Result Date: 04/14/2017 CLINICAL DATA:  Orogastric tube placement EXAM: PORTABLE ABDOMEN - 1 VIEW COMPARISON:  None. FINDINGS: Orogastric tube tip and side port are in the stomach. Visualized bowel gas pattern unremarkable. No appreciable free air. Pacemaker leads attached to right heart. IMPRESSION: Orogastric tube tip and side port in stomach. Bowel gas pattern unremarkable. Electronically Signed   By: Lowella Grip III M.D.   On: 04/14/2017 11:40     Medications:     Scheduled Medications: . acetaminophen  1,000 mg Oral Q6H   Or  . acetaminophen (TYLENOL) oral liquid 160 mg/5 mL  1,000 mg Per Tube Q6H  . bisacodyl  10 mg Oral Daily   Or  . bisacodyl  10 mg Rectal Daily  . chlorhexidine gluconate (MEDLINE KIT)  15 mL Mouth Rinse BID  . fentaNYL (SUBLIMAZE) injection  50 mcg Intravenous Once  . insulin aspart  0-24 Units Subcutaneous Q4H  . mouth rinse  15 mL Mouth Rinse 10 times per day  . pantoprazole sodium  40 mg Per Tube Daily  . sodium chloride flush  3 mL Intravenous Q12H    Infusions: . sodium chloride 20 mL/hr at 04/14/17 1500  . sodium chloride    . sodium chloride Stopped (04/14/17 1015)  . sodium chloride    . amiodarone 30 mg/hr (04/14/17 1500)  . cangrelor 50 mg in NS 250  mL    . cefUROXime (ZINACEF)  IV Stopped (04/14/17 4650)  . dexmedetomidine (PRECEDEX) IV infusion 0.7 mcg/kg/hr (04/14/17 1500)  . epinephrine 2 mcg/min (04/14/17 1500)  . fentaNYL infusion INTRAVENOUS 200 mcg/hr (04/14/17 1500)  . furosemide (LASIX) infusion 5 mg/hr (04/14/17 1500)  . impella catheter heparin 50 unit/mL in dextrose 5% Stopped (04/14/17 1402)  . impella catheter heparin 50 unit/mL in dextrose 5%    . heparin Stopped (04/14/17 1328)  . lactated ringers    . lactated ringers 20 mL/hr at 04/14/17 1500  . lactated ringers 20 mL/hr at 04/14/17 1500  . nitroGLYCERIN Stopped (04/12/17 2200)  . phenylephrine (NEO-SYNEPHRINE) Adult infusion 15.067 mcg/min (04/14/17 1500)  . vancomycin Stopped (04/14/17 1504)    PRN Medications: sodium chloride, fentaNYL, lactated ringers, metoprolol tartrate, midazolam, ondansetron (ZOFRAN) IV, sodium chloride flush    Patient Profile    [name] is 66 year old with h/o MI, HTN, COPD admitted with chest pain. EKG with evidence of inferior/lateral STEMI. Due to lethargy and hypoxemia he was intubated in the ED.  Taken to cath lab with RCA DES and POBA to chronically occluded LAD with IABP placed.   Had LV pseudo aneurysm repair 11/14 .     Assessment/Plan  1. S/P LV pseudoaneurysm repair 11/14 Chest closed 04/13/17 2. STEMI -LHC -RCA DES and ballon angioplasty to mid lad with 25% residual stenosis.  IABP placed due to high LVEDP. IABP removed 11/15.  -On asa 3. Acute Hypoxic Respiratory Failure  Intubated 11/13 prior to cath CCM following. Remains intubated.   4. Cardiogenic Shock  - Impella 5.0. P7  3.7 flow  -On epi 2 mcg, neo 15 mcg.  - Volume status elevated. Continue lasix drip 5 mg per hour.   --No bb. No ARB for now.  5. AKI  Admit creatinine 1.67 Todays creatinine 1.0 6. COPD   Length of Stay: 3   Amy Clegg, NP  04/14/2017, 3:45 PM  Advanced Heart Failure Team Pager [phone] (M-F; 7a - 4p)  Please contact  [facility] for night-coverage after hours (4p -7a ) and weekends on [email or web address]  Agree with above  He was in OR this am for sternal closure. Remains intubated. On low-dose pressors. Impella-5 in place. Vitals and swan numbers reviewed personally. Renal function stable. Is diuresing well on lasix drip. Creatinine stable  impella flow 3.7 L on P-7. Good waveforms.   On exam  Intubated sedated RIJ swan Cor Impella hum regular  Sternal dressing intact Lungs clear Ext warm no edema  He remains very tenuous but improving. Now s/p sternal closure. Will wean inotropes slowly. Keep impella over the weekend. Hopefully can pull Monday. D/w Dr. Servando Snare.   CRITICAL CARE Performed by: Glori Bickers  Total critical care time: 45 minutes  Critical care time was exclusive of separately billable procedures and treating other patients.  Critical care was necessary to treat or prevent imminent or life-threatening deterioration.  Critical care was time spent personally by me (independent of midlevel providers or residents) on the following activities: development of treatment plan with patient and/or surrogate as well as nursing, discussions with consultants, evaluation of patient's response to treatment, examination of patient, obtaining history from  patient or surrogate, ordering and performing treatments and interventions, ordering and review of laboratory studies, ordering and review of radiographic studies, pulse oximetry and re-evaluation of patient's condition.    Glori Bickers, MD  7:47 PM

## 2017-04-14 NOTE — Progress Notes (Addendum)
ANTICOAGULATION CONSULT NOTE  Pharmacy Consult for Heparin Indication: Impella  No Known Allergies  Patient Measurements: Height: 5\' 7"  (170.2 cm) Weight: 216 lb 7.9 oz (98.2 kg) IBW/kg (Calculated) : 66.1 Heparin Dosing Weight:   Vital Signs: Temp: 99.7 F (37.6 C) (11/16 1200) Temp Source: Core (11/16 1200) BP: 95/77 (11/16 1200) Pulse Rate: 88 (11/16 1200)  Labs: Recent Labs    04/11/17 2138  04/12/17 0057 04/12/17 0605 04/12/17 1248  04/12/17 2147  04/13/17 0348  04/13/17 1609 04/13/17 1615 04/13/17 2307 04/14/17 0325  HGB 14.5   < > 13.5 12.1*  --    < > 13.0   < > 12.6*   < > 12.2* 12.2* 11.6* 12.1*  HCT 46.7   < > 42.5 38.5*  --    < > 40.2   < > 38.9*   < > 36.0* 37.3* 34.0* 37.1*  PLT 235  --  194 202  --    < > 79*  --  89*  --   --  85*  --  80*  APTT 30  --   --   --   --   --  41*  --  68*  --   --   --   --  57*  LABPROT 15.9*  --   --   --   --   --  13.0  --   --   --   --   --   --   --   INR 1.28  --   --   --   --   --  0.99  --   --   --   --   --   --   --   CREATININE 1.67*   < > 1.52* 1.42*  --    < >  --   --  1.06  --  0.90 0.90  --  1.04  TROPONINI 0.09*  --  0.11* 0.18* 0.29*  --   --   --   --   --   --   --   --   --    < > = values in this interval not displayed.    Estimated Creatinine Clearance: 75.9 mL/min (by C-G formula based on SCr of 1.04 mg/dL).   Medical History: Past Medical History:  Diagnosis Date  . Coronary artery disease   . Hypertension     Assessment: 66 yo male presents with STEMI, brought to cath, received DES for RCA and IABP placed for high LVEDP. Patient was later found to have a LV pseudoanyeurism rupture, brought to OR for repair and Impella was placed for cardiogenic shock. IABP now removed.   Impella 5.0, P7, Flow Rate 3.7, Purge Rate 17.6. APTT 57, plts 80 (quick drop but now stabilizing off IABP, HIT panel sent out).  Given purge flow rate - he is receiving IV heparin at 880 units/hr.  ACT = 147,  discussed with Dr. Tyrone SageGerhardt, will not initiate systemic heparin for now.   Goal of Therapy:  Monitor platelets by anticoagulation protocol: Yes   Plan:  Heparin per purge solution only for now. Monitor daily APTT Check ACTs q 12 hrs. Monitor platelets and results of HIT antibody panel  Tad MooreJessica Juna Caban, Pharm D, BCPS  Clinical Pharmacist Pager [phone]  04/14/2017 12:42 PM

## 2017-04-14 NOTE — Transfer of Care (Signed)
Immediate Anesthesia Transfer of Care Note  Patient: Arlana HoveGary Keithley  Procedure(s) Performed: STERNAL CLOSURE (N/A Chest) TRANSESOPHAGEAL ECHOCARDIOGRAM (TEE) (N/A Chest) APPLICATION OF WOUND VAC with reposition of Impella device (N/A Chest)  Patient Location: ICU  Anesthesia Type:General  Level of Consciousness: Patient remains intubated per anesthesia plan  Airway & Oxygen Therapy: Patient remains intubated per anesthesia plan and Patient placed on Ventilator (see vital sign flow sheet for setting)  Post-op Assessment: Report given to RN and Post -op Vital signs reviewed and stable  Post vital signs: Reviewed and stable  Last Vitals:  Vitals:   04/14/17 0715 04/14/17 0730  BP:    Pulse: 88 88  Resp: (!) 24 (!) 24  Temp:  (!) 38.2 C  SpO2:  99%    Last Pain:  Vitals:   04/14/17 0730  TempSrc: Core  PainSc:          Complications: No apparent anesthesia complications

## 2017-04-14 NOTE — Progress Notes (Signed)
Patient ID: Arlana HoveGary Dambach, male   DOB: [birth year], 66 y.o.   MRN: [id number] Pre Procedure note for inpatients:  closure of chest today. The various methods of treatment have been discussed with the patient. After consideration of the risks, benefits and treatment options the patient has consented to the planned procedure.   The patient has been seen and labs reviewed. There are no changes in the patient's condition to prevent proceeding with the planned procedure today.  Recent labs:  Lab Results  Component Value Date   WBC 18.3 (H) 04/14/2017   HGB 12.1 (L) 04/14/2017   HCT 37.1 (L) 04/14/2017   PLT 80 (L) 04/14/2017   GLUCOSE 118 (H) 04/14/2017   CHOL 167 04/11/2017   TRIG 109 04/11/2017   HDL 27 (L) 04/11/2017   LDLCALC 118 (H) 04/11/2017   ALT 26 04/14/2017   AST 69 (H) 04/14/2017   NA 133 (L) 04/14/2017   K 3.7 04/14/2017   CL 101 04/14/2017   CREATININE 1.04 04/14/2017   BUN 10 04/14/2017   CO2 25 04/14/2017   INR 0.99 04/12/2017   On neo and low dose epi .   Delight OvensEdward B Laylanie Kruczek, MD 04/14/2017 7:07 AM

## 2017-04-15 ENCOUNTER — Inpatient Hospital Stay (HOSPITAL_COMMUNITY): Payer: Medicare Other

## 2017-04-15 LAB — POCT I-STAT, CHEM 8
BUN: 14 mg/dL (ref 6–20)
CALCIUM ION: 1.11 mmol/L — AB (ref 1.15–1.40)
CREATININE: 1 mg/dL (ref 0.61–1.24)
Chloride: 95 mmol/L — ABNORMAL LOW (ref 101–111)
GLUCOSE: 135 mg/dL — AB (ref 65–99)
HEMATOCRIT: 35 % — AB (ref 39.0–52.0)
Hemoglobin: 11.9 g/dL — ABNORMAL LOW (ref 13.0–17.0)
Potassium: 3.5 mmol/L (ref 3.5–5.1)
Sodium: 134 mmol/L — ABNORMAL LOW (ref 135–145)
TCO2: 27 mmol/L (ref 22–32)

## 2017-04-15 LAB — CBC
HCT: 34.5 % — ABNORMAL LOW (ref 39.0–52.0)
Hemoglobin: 11.5 g/dL — ABNORMAL LOW (ref 13.0–17.0)
MCH: 29.1 pg (ref 26.0–34.0)
MCHC: 33.3 g/dL (ref 30.0–36.0)
MCV: 87.3 fL (ref 78.0–100.0)
Platelets: 74 10*3/uL — ABNORMAL LOW (ref 150–400)
RBC: 3.95 MIL/uL — ABNORMAL LOW (ref 4.22–5.81)
RDW: 14.6 % (ref 11.5–15.5)
WBC: 17.4 10*3/uL — ABNORMAL HIGH (ref 4.0–10.5)

## 2017-04-15 LAB — GLUCOSE, CAPILLARY
GLUCOSE-CAPILLARY: 116 mg/dL — AB (ref 65–99)
GLUCOSE-CAPILLARY: 149 mg/dL — AB (ref 65–99)
Glucose-Capillary: 113 mg/dL — ABNORMAL HIGH (ref 65–99)
Glucose-Capillary: 122 mg/dL — ABNORMAL HIGH (ref 65–99)

## 2017-04-15 LAB — PLATELET INHIBITION P2Y12: Platelet Function  P2Y12: 110 [PRU] — ABNORMAL LOW (ref 194–418)

## 2017-04-15 LAB — COMPREHENSIVE METABOLIC PANEL
ALT: 25 U/L (ref 17–63)
AST: 47 U/L — ABNORMAL HIGH (ref 15–41)
Albumin: 2.4 g/dL — ABNORMAL LOW (ref 3.5–5.0)
Alkaline Phosphatase: 38 U/L (ref 38–126)
Anion gap: 6 (ref 5–15)
BUN: 11 mg/dL (ref 6–20)
CO2: 28 mmol/L (ref 22–32)
Calcium: 8 mg/dL — ABNORMAL LOW (ref 8.9–10.3)
Chloride: 98 mmol/L — ABNORMAL LOW (ref 101–111)
Creatinine, Ser: 0.92 mg/dL (ref 0.61–1.24)
GFR calc Af Amer: >60 mL/min (ref >60)
GFR calc non Af Amer: >60 mL/min (ref >60)
Glucose, Bld: 124 mg/dL — ABNORMAL HIGH (ref 65–99)
Potassium: 3.2 mmol/L — ABNORMAL LOW (ref 3.5–5.1)
Sodium: 132 mmol/L — ABNORMAL LOW (ref 135–145)
Total Bilirubin: 2 mg/dL — ABNORMAL HIGH (ref 0.3–1.2)
Total Protein: 4.9 g/dL — ABNORMAL LOW (ref 6.5–8.1)

## 2017-04-15 LAB — VANCOMYCIN, TROUGH: Vancomycin Tr: 13 ug/mL — ABNORMAL LOW (ref 15–20)

## 2017-04-15 LAB — POCT I-STAT 3, ART BLOOD GAS (G3+)
Acid-Base Excess: 5 mmol/L — ABNORMAL HIGH (ref 0.0–2.0)
BICARBONATE: 29 mmol/L — AB (ref 20.0–28.0)
O2 Saturation: 92 %
PH ART: 7.456 — AB (ref 7.350–7.450)
PO2 ART: 66 mmHg — AB (ref 83.0–108.0)
Patient temperature: 38.4
TCO2: 30 mmol/L (ref 22–32)
pCO2 arterial: 41.7 mmHg (ref 32.0–48.0)

## 2017-04-15 LAB — LACTATE DEHYDROGENASE: LDH: 324 U/L — ABNORMAL HIGH (ref 98–192)

## 2017-04-15 LAB — APTT: aPTT: 54 seconds — ABNORMAL HIGH (ref 24–36)

## 2017-04-15 LAB — POCT ACTIVATED CLOTTING TIME: Activated Clotting Time: 147 seconds

## 2017-04-15 MED ORDER — POTASSIUM CHLORIDE 10 MEQ/50ML IV SOLN
10.0000 meq | INTRAVENOUS | Status: AC
  Administered 2017-04-15 (×3): 10 meq via INTRAVENOUS
  Filled 2017-04-15 (×3): qty 50

## 2017-04-15 MED ORDER — VANCOMYCIN HCL IN DEXTROSE 1-5 GM/200ML-% IV SOLN
1000.0000 mg | Freq: Two times a day (BID) | INTRAVENOUS | Status: DC
  Administered 2017-04-15: 1000 mg via INTRAVENOUS
  Filled 2017-04-15: qty 200

## 2017-04-15 MED ORDER — VANCOMYCIN HCL 10 G IV SOLR: 1250.0000 mg | Freq: Two times a day (BID) | INTRAVENOUS | Status: DC

## 2017-04-15 MED ORDER — VITAL 1.5 CAL PO LIQD
1000.0000 mL | ORAL | Status: DC
  Administered 2017-04-15 – 2017-04-16 (×2): 1000 mL
  Filled 2017-04-15 (×3): qty 1000

## 2017-04-15 MED ORDER — POTASSIUM CHLORIDE 20 MEQ/15ML (10%) PO SOLN
40.0000 meq | Freq: Two times a day (BID) | ORAL | Status: AC
  Administered 2017-04-15 (×2): 40 meq via ORAL
  Filled 2017-04-15 (×2): qty 30

## 2017-04-15 MED ORDER — PIPERACILLIN-TAZOBACTAM 3.375 G IVPB
3.3750 g | Freq: Three times a day (TID) | INTRAVENOUS | Status: DC
  Administered 2017-04-15 – 2017-04-18 (×8): 3.375 g via INTRAVENOUS
  Filled 2017-04-15 (×10): qty 50

## 2017-04-15 MED ORDER — ALBUMIN HUMAN 5 % IV SOLN
12.5000 g | Freq: Once | INTRAVENOUS | Status: AC
  Administered 2017-04-15: 12.5 g via INTRAVENOUS
  Filled 2017-04-15: qty 250

## 2017-04-15 MED ORDER — ASPIRIN 81 MG PO CHEW
81.0000 mg | CHEWABLE_TABLET | Freq: Every day | ORAL | Status: DC
  Administered 2017-04-15 – 2017-04-18 (×4): 81 mg via ORAL
  Filled 2017-04-15 (×4): qty 1

## 2017-04-15 NOTE — Progress Notes (Signed)
CT surgery p.m. Rounds  Feeding tube placed and vital 1.5 started Phenylephrine weaned off VAD flow remains at 3.8 L/min Cardiac index 1.8-2.0 Good urine output P.m. labs satisfactory

## 2017-04-15 NOTE — Addendum Note (Signed)
Addendum  created 04/15/17 1238 by Kipp BroodJoslin, Maylene Crocker, MD   Sign clinical note

## 2017-04-15 NOTE — Op Note (Signed)
[name]:  [name], [name]                ACCOUNT NO.:  [id number]  MEDICAL RECORD NO.:  [id number]  LOCATION:  2H21C                        FACILITY:  [facility]  PHYSICIAN:  Sheliah PlaneEdward Pailynn Vahey, MD    DATE OF BIRTH:  1950/06/18  DATE OF PROCEDURE:  04/11/2017 DATE OF DISCHARGE:                              OPERATIVE REPORT   PREOPERATIVE DIAGNOSES:  Recent ST-elevation myocardial infarction, cardiogenic shock with question of impending ventricular rupture/left ventricular false aneurysm.  POSTOPERATIVE DIAGNOSES:  Recent ST-elevation myocardial infarction, cardiogenic shock with question of impending ventricular rupture/left ventricular false aneurysm.  SURGICAL PROCEDURE: 1. Cardiopulmonary bypass. 2. Resection and plication of left ventricular aneurysm, placement of     Impella 5.0.  SURGEON:  Sheliah PlaneEdward Brandell Maready, MD.  FIRST ASSISTANT:  [name].  SECOND ASSISTANT:  [name], [place].  BRIEF HISTORY:  The patient is a 66 year old male who I was asked to see urgently on the day of surgery for question of left ventricular false aneurysm and cardiogenic shock.  The patient was admitted the night before after he was brought to the emergency room by EMS in severe respiratory distress, was intubated.  There was a question of STEMI.  He was taken directly to the cath lab, intubated.  Balloon pump was placed. Cardiac catheterization was done, but without left ventriculogram because of elevated left ventricular end-diastolic pressures.  At the time of catheterization, his LAD was totally occluded between the mid and distal vessel.  This could not be opened.  His circumflex had mild disease.  The right coronary artery was patent without significant disease.  There was in the posterolateral branch an ulcerated plaque.  A stent was placed here.  The patient was then taken to the Coronary Care Unit.  The following day, the day of surgery, an echocardiogram was performed showing 20% ejection fraction  with a large false left ventricular aneurysm at the distal anterior wall and apex.  The patient remained on Levophed, epinephrine, and an intra-aortic balloon pump. TEE was performed at the bedside confirming diagnosis.  After discussion with this sister, noted the week before he had called her and was having increasing shortness of breath and chest discomfort.  We recommended proceeding with resection of the ventricular false aneurysm and possible placement of support device.  The patient's sister signed his permission forms for him as he was intubated and sedated on the ventilator.  DESCRIPTION OF THE PROCEDURE:  As noted, the patient already had a balloon pump in place and was intubated.  He was taken to the operating room.  The chest and legs were prepped with Betadine and draped in usual sterile manner.  TEE probe was placed confirming previous observations. Appropriate time-out was performed.  Median sternotomy was done. Pericardium was opened.  There was straw-colored pericardial fluid free in the pericardium.  This was evacuated.  As we opened the chest, it was obvious that there was a large ventricular aneurysm adherent to the pericardium involving the anterior and apical left ventricle.  There were adhesions to the pericardium.  The aneurysm did not appear to be acute over the last several days, but older.  On TEE, the aortic and mitral valves were  functioning adequately.  The patient was systemically heparinized.  Ascending aorta was cannulated.  The right atrium was cannulated.  The patient was placed on cardiopulmonary bypass at 2.4 L/min/m2.  The patient's body temperature was cooled to 32 degrees.  We attempted to place a retrograde coronary sinus catheter, but had some difficulty with proper positioning and abandoned this and placed aortic root vent cardioplegia needle in the ascending aorta.  Aortic cross- clamp was applied.  800 mL of cold blood potassium cardioplegia  was administered antegrade.  With concern of intraventricular clot, we waited until after the cross-clamp was placed and then placed a right superior pulmonary vein vent into the ventricle with the heart relaxed in diastole with topical cold in place, we then proceeded to dissect the LV aneurysm off the pericardium.  With this freed, we then opened the aneurysm and evacuated a significant amount of necrotic muscle and clot. This was resected back to the point of viable-appearing endocardium.  We then decided to plicate the aneurysm with interrupted pledgeted 0 Prolene sutures.  A second layer of running Prolene was placed through the felt strips.  With aneurysm open, we could easily identify the anterior and posterior papillary muscles and the base of the muscle did not appear to be involved.  CO2 was instilled in the pericardial sac as we were working on the ventricle.  With the preoperative situation of the patient, we decided that to proceed with placement of a 5.0 Impella device with the cross-clamp still in place, a 10-mm Gelweave graft was anastomosed to the lateral side of the descending aorta and then brought out through a separate site just at the superior end of the sternum. The aortic cross-clamp was removed.  The graft was allowed to fill and then clamped.  Further de-airing maneuvers were carried out.  The patient's body temperature was rewarmed to 37 degrees.  With the crossclamp removed, we under ultrasound guidance then positioned the 5.0 Impella device across the aortic valve and began flow.  The heart de- aired.  The right superior pulmonary vein vent was removed.  With the patient's body temperature rewarmed and with the Impella running at P7 with flows at approximately 3.2-3.8 L/min, we then began ventilating the patient and weaned him from cardiopulmonary bypass with pressor support and the Impella.  Balloon pump was left on 1-3.  He was decannulated in usual fashion.   Protamine sulfate was administered because of the diffuse coagulopathy, likely related to cangrelor and Angiomax which had been given preoperatively before the patient full diagnosis had been completed, added to coagulopathy with platelets, administered fresh frozen and ultimately factor V and finally able to obtain degree of hemostasis.  Two Blake mediastinal drains were left in place.  We attempted to close the chest, however, each attempt just before fully closing compromised the right ventricle to the point that Impella flows were low.  So, we decided to leave the chest open as marked and sewed around the incision and the wound VAC was placed along with sterile dressings.  Sponge and needle count was reported as correct.  The patient was then transferred to the Surgical Intensive Care Unit in critical condition.  Because of low hematocrit while on pump and diffuse coagulopathy, blood products including packed red blood cells were administered.  With the chest open, adequate blood pressure was obtained and adequate flows with the Impella.     Sheliah Plane, MD     EG/MEDQ  D:  04/14/2017  T:  04/15/2017  Job:  [id number]

## 2017-04-15 NOTE — Progress Notes (Signed)
Cortrak Tube Team Note:  Consult received to place a Cortrak feeding tube.   A 10 F Cortrak tube was placed in the R nare and secured with a nasal bridle at 115 cm. Per the Cortrak monitor reading the tube tip is located in the distal duodenum, approximately D3/D4.    X-ray is required, abdominal x-ray has been ordered by the Cortrak team. Please confirm tube placement before using the Cortrak tube.   If the tube becomes dislodged please keep the tube and contact the Cortrak team at [email or web address] ([email or web address]) for replacement.  If after hours and replacement cannot be delayed, place a NG tube and confirm placement with an abdominal x-ray.   Ronald LouisNathan [name] RD, LDN, CNSC Clinical Nutrition Pager: [phone] 04/15/2017 1:12 PM

## 2017-04-15 NOTE — Progress Notes (Signed)
1 Day Post-Op Procedure(s) (LRB): STERNAL CLOSURE (N/A) TRANSESOPHAGEAL ECHOCARDIOGRAM (TEE) (N/A) APPLICATION OF WOUND VAC with reposition of Impella device (N/A) Subjective: Eyes open  Stable on Impella LD Flow 3.8L Needs nutrition - cortrack ordered Keep ventilated until VAD removal  Objective: Vital signs in last 24 hours: Temp:  [99 F (37.2 C)-101.7 F (38.7 C)] 100 F (37.8 C) (11/17 1100) Pulse Rate:  [25-88] 38 (11/17 1100) Cardiac Rhythm: Normal sinus rhythm (11/17 0800) Resp:  [0-27] 19 (11/17 1100) BP: (79-111)/(65-88) 111/88 (11/17 1100) SpO2:  [65 %-100 %] 96 % (11/17 1140) Arterial Line BP: (77-127)/(63-88) 106/70 (11/17 1100) FiO2 (%):  [50 %-65 %] 55 % (11/17 1140) Weight:  [214 lb 4.6 oz (97.2 kg)] 214 lb 4.6 oz (97.2 kg) (11/17 0411)  Hemodynamic parameters for last 24 hours: PAP: (26-44)/(12-29) 26/13 CVP:  [5 mmHg-20 mmHg] 6 mmHg CO:  [3.8 L/min-4.3 L/min] 3.8 L/min CI:  [1.8 L/min/m2-2.1 L/min/m2] 1.8 L/min/m2  Intake/Output from previous day: 11/16 0701 - 11/17 0700 In: 5607 [I.V.:4033.2; NG/GT:100; IV Piggyback:1050] Out: 5779 [Urine:4250; Emesis/NG output:650; Blood:50; Chest Tube:79] Intake/Output this shift: Total I/O In: 851.8 [I.V.:615.1; Other:56.7; NG/GT:30; IV Piggyback:150] Out: 575 [Urine:455; Emesis/NG output:100; Chest Tube:20]       Exam    General- alert and comfortable   Lungs- clear without rales, wheezes   Cor- regular rate and rhythm, no murmur , gallop   Abdomen- soft, non-tender   Extremities - warm, non-tender, minimal edema   Neuro- oriented, appropriate, no focal weakness   Lab Results: Recent Labs    04/14/17 1701 04/15/17 0257  WBC 17.9* 17.4*  HGB 11.4* 11.5*  HCT 34.9* 34.5*  PLT 76* 74*   BMET:  Recent Labs    04/14/17 1701 04/15/17 0257  NA 132* 132*  K 3.3* 3.2*  CL 99* 98*  CO2 29 28  GLUCOSE 116* 124*  BUN 11 11  CREATININE 0.88 0.92  CALCIUM 8.4* 8.0*    PT/INR:  Recent Labs   04/12/17 2147  LABPROT 13.0  INR 0.99   ABG    Component Value Date/Time   PHART 7.456 (H) 04/15/2017 0310   HCO3 29.0 (H) 04/15/2017 0310   TCO2 30 04/15/2017 0310   ACIDBASEDEF 4.0 (H) 04/13/2017 0357   O2SAT 92.0 04/15/2017 0310   CBG (last 3)  Recent Labs    04/14/17 2338 04/15/17 0308 04/15/17 0754  GLUCAP 110* 116* 113*    Assessment/Plan: S/P Procedure(s) (LRB): STERNAL CLOSURE (N/A) TRANSESOPHAGEAL ECHOCARDIOGRAM (TEE) (N/A) APPLICATION OF WOUND VAC with reposition of Impella device (N/A) p2y12 at 110- therapeutic plts74 k stable Start TF \\doing  well after repair LV aneurysm  LOS: 4 days    [name] 04/15/2017

## 2017-04-15 NOTE — Addendum Note (Signed)
Addendum  created 04/15/17 1046 by Kipp BroodJoslin, Vannie Hochstetler, MD   Pend clinical note

## 2017-04-15 NOTE — Progress Notes (Signed)
Anesthesiology Follow-up:  Patient sedated on vent, opens eyes and follows simple commands. Impella functioning  at P7 with 3.7 L/M flow.  Precedex 0.7 mcg/kg/min Fentanyl 125 mcg/hr Lasix 5 mg/hr Epinephrine 2 mcg/min Neosynephrine 15 mcg/min  VS: T- 37.8 BP- 97/77 (86) HR-72 (SR)  PA 29/14 CVP 7 CO/CI- 3.8/1.8   PRVC 55 RR 24 FiO2 55% PEEP 5 PAP 28  K-3.2 Na- 132 H/H- 11.5/34.5 Platelets- 74,000 PH 7.46 PCO2 4642 PO352 6566  66 year old male 3 days S/P LV pseudoaneurysm repair,  One day S/P chest closure, TEE showed impella to be in good position in LV with inflow 3.5 cm form aortic valve coaptation point. RV function appeared adequate. Diuresing well on lasix drip. Hemodynamically stable.  [name]

## 2017-04-15 NOTE — Progress Notes (Addendum)
Advanced Heart Failure Rounding Note   Subjective:    -S/P LV pseudoaneurysm repeat 11/14 Impella 5.0 Placed.  -Chest closed 11/16.   Remains on vent. Will awaken and follow commands. Impella 5.0 on P7 with 3.7 flow. Currently on amio 30 mg per hour, epi 2 mcg off neo.  On lasix gtt at 5. On cangrelor for recent stent.   Weight down 2 pounds. Tmax 101.7. WBC 14k   Impella flow 3.7L on P-7   Swan #s CVP1  PAP 37/22  CO 3.8  CI 1.8  Objective:   Weight Range: 97.2 kg (214 lb 4.6 oz) Body mass index is 33.56 kg/m.   Vital Signs:   Temp:  [98.4 F (36.9 C)-101.7 F (38.7 C)] 101.1 F (38.4 C) (11/17 0700) Pulse Rate:  [25-97] 74 (11/17 0700) Resp:  [0-27] 24 (11/17 0700) BP: (79-107)/(65-87) 101/83 (11/17 0700) SpO2:  [65 %-100 %] 100 % (11/17 0806) Arterial Line BP: (77-163)/(63-101) 98/75 (11/17 0700) FiO2 (%):  [50 %-65 %] 65 % (11/17 0806) Weight:  [97.2 kg (214 lb 4.6 oz)] 97.2 kg (214 lb 4.6 oz) (11/17 0411) Last BM Date: (PTA)  Weight change: Filed Weights   04/13/17 0440 04/14/17 0400 04/15/17 0411  Weight: 99.5 kg (219 lb 5.7 oz) 98.2 kg (216 lb 7.9 oz) 97.2 kg (214 lb 4.6 oz)    Intake/Output:   Intake/Output Summary (Last 24 hours) at 04/15/2017 0826 Last data filed at 04/15/2017 0807 Gross per 24 hour  Intake 5760.34 ml  Output 5719 ml  Net 41.34 ml      Physical Exam   General:  Intubated . Awakens on vent and follows commands HEENT: normal ETT + Neck: supple.+swan Carotids 2+ bilat; no bruits. No lymphadenopathy or thryomegaly appreciated. Cor: Sternal dressing in place. Regular. + impella hum  Lungs: mild rhonchi  Abdomen: soft, nontender, nondistended. No hepatosplenomegaly. No bruits or masses. Good bowel sounds. Extremities: no cyanosis, clubbing, rash, 1+ edema Neuro: intubated. Sedated. Wil awaken and follow comamnds   Telemetry   NSR 70s. Personally reviewed   EKG  N/A  Labs    CBC Recent Labs    04/14/17 1701  04/15/17 0257  WBC 17.9* 17.4*  HGB 11.4* 11.5*  HCT 34.9* 34.5*  MCV 87.5 87.3  PLT 76* 74*   Basic Metabolic Panel Recent Labs    04/12/17 1248  04/13/17 0348  04/13/17 1615  04/14/17 1701 04/15/17 0257  NA  --    < > 136   < >  --    < > 132* 132*  K  --    < > 4.6   < >  --    < > 3.3* 3.2*  CL  --    < > 109   < >  --    < > 99* 98*  CO2  --   --  22  --   --    < > 29 28  GLUCOSE  --    < > 155*   < >  --    < > 116* 124*  BUN  --    < > 12   < >  --    < > 11 11  CREATININE  --    < > 1.06   < > 0.90   < > 0.88 0.92  CALCIUM  --   --  8.9  --   --    < > 8.4* 8.0*  MG 1.6*  --  2.3  --  1.8  --   --   --   PHOS 2.8  --   --   --   --   --   --   --    < > = values in this interval not displayed.   Liver Function Tests Recent Labs    04/14/17 0325 04/15/17 0257  AST 69* 47*  ALT 26 25  ALKPHOS 40 38  BILITOT 1.2 2.0*  PROT 4.9* 4.9*  ALBUMIN 2.7* 2.4*   No results for input(s): LIPASE, AMYLASE in the last 72 hours. Cardiac Enzymes Recent Labs    04/12/17 1248  TROPONINI 0.29*    BNP: BNP (last 3 results) No results for input(s): BNP in the last 8760 hours.  ProBNP (last 3 results) No results for input(s): PROBNP in the last 8760 hours.   D-Dimer No results for input(s): DDIMER in the last 72 hours. Hemoglobin A1C No results for input(s): HGBA1C in the last 72 hours. Fasting Lipid Panel No results for input(s): CHOL, HDL, LDLCALC, TRIG, CHOLHDL, LDLDIRECT in the last 72 hours. Thyroid Function Tests No results for input(s): TSH, T4TOTAL, T3FREE, THYROIDAB in the last 72 hours.  Invalid input(s): FREET3  Other results:   Imaging    Dg Chest Port 1 View  Result Date: 04/15/2017 CLINICAL DATA:  Chest tube EXAM: PORTABLE CHEST 1 VIEW COMPARISON:  04/14/2017 FINDINGS: Endotracheal tube, right central line and Swan-Ganz catheter remain in place, unchanged. NG tube enters the stomach. Small bilateral pleural effusions. Worsening right base  atelectasis or infiltrate. Stable left basilar atelectasis. Cardiomegaly with vascular congestion. No pneumothorax. IMPRESSION: Increasing right base atelectasis. Stable left base atelectasis and small bilateral effusions. Cardiomegaly, vascular congestion. Electronically Signed   By: Rolm Baptise M.D.   On: 04/15/2017 07:17   Dg Abd Portable 1v  Result Date: 04/14/2017 CLINICAL DATA:  Orogastric tube placement EXAM: PORTABLE ABDOMEN - 1 VIEW COMPARISON:  None. FINDINGS: Orogastric tube tip and side port are in the stomach. Visualized bowel gas pattern unremarkable. No appreciable free air. Pacemaker leads attached to right heart. IMPRESSION: Orogastric tube tip and side port in stomach. Bowel gas pattern unremarkable. Electronically Signed   By: Lowella Grip III M.D.   On: 04/14/2017 11:40     Medications:     Scheduled Medications: . acetaminophen  1,000 mg Oral Q6H   Or  . acetaminophen (TYLENOL) oral liquid 160 mg/5 mL  1,000 mg Per Tube Q6H  . bisacodyl  10 mg Oral Daily   Or  . bisacodyl  10 mg Rectal Daily  . chlorhexidine gluconate (MEDLINE KIT)  15 mL Mouth Rinse BID  . Chlorhexidine Gluconate Cloth  6 each Topical Daily  . fentaNYL (SUBLIMAZE) injection  50 mcg Intravenous Once  . insulin aspart  0-24 Units Subcutaneous Q4H  . mouth rinse  15 mL Mouth Rinse 10 times per day  . pantoprazole sodium  40 mg Per Tube Daily  . sodium chloride flush  10-40 mL Intracatheter Q12H  . sodium chloride flush  3 mL Intravenous Q12H    Infusions: . sodium chloride 20 mL/hr at 04/15/17 0800  . sodium chloride    . sodium chloride Stopped (04/14/17 1015)  . sodium chloride    . amiodarone 30 mg/hr (04/15/17 0807)  . cangrelor 50 mg in NS 250 mL 0.75 mcg/kg/min (04/15/17 0800)  . cefUROXime (ZINACEF)  IV Stopped (04/15/17 0840)  . dexmedetomidine (PRECEDEX) IV infusion 0.7 mcg/kg/hr (04/15/17 0700)  . epinephrine 2  mcg/min (04/15/17 0700)  . fentaNYL infusion INTRAVENOUS 100  mcg/hr (04/15/17 0700)  . furosemide (LASIX) infusion 5 mg/hr (04/15/17 0700)  . impella catheter heparin 50 unit/mL in dextrose 5% Stopped (04/14/17 1402)  . impella catheter heparin 50 unit/mL in dextrose 5%    . heparin Stopped (04/14/17 1328)  . lactated ringers    . lactated ringers 20 mL/hr at 04/15/17 0800  . lactated ringers 20 mL/hr at 04/15/17 0800  . nitroGLYCERIN Stopped (04/12/17 2200)  . phenylephrine (NEO-SYNEPHRINE) Adult infusion 25.067 mcg/min (04/15/17 0700)  . vancomycin Stopped (04/15/17 0159)    PRN Medications: sodium chloride, fentaNYL, lactated ringers, metoprolol tartrate, midazolam, ondansetron (ZOFRAN) IV, sodium chloride flush, sodium chloride flush    Patient Profile    Mr Mena is 66 year old with h/o MI, HTN, COPD admitted with chest pain. EKG with evidence of inferior/lateral STEMI. Due to lethargy and hypoxemia he was intubated in the ED.  Taken to cath lab with RCA DES and POBA to chronically occluded LAD with IABP placed.   Had LV pseudo aneurysm repair 11/14 .     Assessment/Plan   1. CAD with OOH anterior MI c/b LV pseudoaneurysm and cardiogenic shock - attempted PCI of LAD failed due to chronic occlusion and extensive infarct - s/p PCI/DES to distal RCA on cangrelor now  2.  S/P LV pseudoaneurysm repair 11/14 -Chest closed 04/13/17  3. Acute systolic HF-> cardiogenic shock - remains with Impella support. EF ~25% - Impella parameters stable. Flow 3.7L on P-7 . Driveline current ok.  Luiz Blare numbers marginal but ok  Get co-ox in am - Wean epi as toelerated. Can add milrinone as needed - D/w Dr. Prescott Gum. Plan removal of Impella on Tuesday - LDH 324   4. Acute Hypoxic Respiratory Failure  - Intubated 11/13  - Keep intubated until Impell removal on Tuesday - Continue diuresis with IV lasix   5. AKI  - due to ATN shock  - resolved  6. COPD - stable  7. Thrombocytopenia - stable at 75K. HIT panel negative. Remains on heparin.  No obvious bleeding.   8. Hypokalemia - will supp  9. Fever - likely post-op but with Impella will reculture and broaden abx. D/w PharmD.  CRITICAL CARE Performed by: Glori Bickers  Total critical care time: 35 minutes  Critical care time was exclusive of separately billable procedures and treating other patients.  Critical care was necessary to treat or prevent imminent or life-threatening deterioration.  Critical care was time spent personally by me (independent of midlevel providers or residents) on the following activities: development of treatment plan with patient and/or surrogate as well as nursing, discussions with consultants, evaluation of patient's response to treatment, examination of patient, obtaining history from patient or surrogate, ordering and performing treatments and interventions, ordering and review of laboratory studies, ordering and review of radiographic studies, pulse oximetry and re-evaluation of patient's condition.    Length of Stay: Davey, MD  04/15/2017, 8:26 AM  Advanced Heart Failure Team Pager [phone] (M-F; 7a - 4p)  Please contact [facility] for night-coverage after hours (4p -7a ) and weekends on [email or web address]

## 2017-04-15 NOTE — Progress Notes (Signed)
Pharmacy Antibiotic Note  Ronald HoveGary [name] is a 66 y.o. male admitted on 04/11/2017 with surgical prophylaxis.  New fevers overnight, wbc 17. D/w MD and will broaden antibiotics for possible bacteremia with multiple lines and procedures.  Vancomycin trough is just below goal at 13, will increase dose. Scr is normal.  Plan: Increase vancomycin to 1250mg  IV q12 hours - starting tonight Broaden to zosyn 3.375g IV q8 hours Check cultures  Height: 5\' 7"  (170.2 cm) Weight: 214 lb 4.6 oz (97.2 kg) IBW/kg (Calculated) : 66.1  Temp (24hrs), Avg:100.5 F (38.1 C), Min:98.4 F (36.9 C), Max:101.7 F (38.7 C)  Recent Labs  Lab 04/12/17 1254  04/13/17 0348 04/13/17 0832 04/13/17 1207  04/13/17 1615 04/14/17 0325  04/14/17 1505 04/14/17 1606 04/14/17 1641 04/14/17 1701 04/15/17 0257  WBC  --    < > 14.7*  --   --   --  17.5* 18.3*  --   --   --   --  17.9* 17.4*  CREATININE  --    < > 1.06  --   --    < > 0.90 1.04   < > 0.50* 0.60* 0.60* 0.88 0.92  LATICACIDVEN 0.9  --   --  2.4* 1.6  --   --   --   --   --   --   --   --   --    < > = values in this interval not displayed.    Estimated Creatinine Clearance: 85.3 mL/min (by C-G formula based on SCr of 0.92 mg/dL).    No Known Allergies   Thank you for allowing pharmacy to be a part of this patient's care.  Sheppard CoilFrank Wilson PharmD., BCPS Clinical Pharmacist Pager [phone] 04/15/2017 10:04 AM

## 2017-04-16 ENCOUNTER — Inpatient Hospital Stay (HOSPITAL_COMMUNITY): Payer: Medicare Other

## 2017-04-16 DIAGNOSIS — R55 Syncope and collapse: Secondary | ICD-10-CM

## 2017-04-16 LAB — TYPE AND SCREEN
ABO/RH(D): O POS
Antibody Screen: NEGATIVE
UNIT DIVISION: 0
UNIT DIVISION: 0
UNIT DIVISION: 0
UNIT DIVISION: 0
UNIT DIVISION: 0
UNIT DIVISION: 0
UNIT DIVISION: 0
Unit division: 0
Unit division: 0
Unit division: 0

## 2017-04-16 LAB — BLOOD GAS, ARTERIAL
Acid-Base Excess: 4.7 mmol/L — ABNORMAL HIGH (ref 0.0–2.0)
BICARBONATE: 28.9 mmol/L — AB (ref 20.0–28.0)
Drawn by: 414221
FIO2: 50
LHR: 24 {breaths}/min
O2 SAT: 98.4 %
PATIENT TEMPERATURE: 100
PCO2 ART: 45.9 mmHg (ref 32.0–48.0)
PEEP/CPAP: 5 cmH2O
PH ART: 7.419 (ref 7.350–7.450)
PO2 ART: 132 mmHg — AB (ref 83.0–108.0)
VT: 500 mL

## 2017-04-16 LAB — BPAM RBC
BLOOD PRODUCT EXPIRATION DATE: 201812062359
BLOOD PRODUCT EXPIRATION DATE: 201812072359
BLOOD PRODUCT EXPIRATION DATE: 201812072359
Blood Product Expiration Date: 201812062359
Blood Product Expiration Date: 201812062359
Blood Product Expiration Date: 201812062359
Blood Product Expiration Date: 201812062359
Blood Product Expiration Date: 201812062359
Blood Product Expiration Date: 201812072359
Blood Product Expiration Date: 201812072359
ISSUE DATE / TIME: 201811141358
ISSUE DATE / TIME: 201811150305
ISSUE DATE / TIME: 201811161520
ISSUE DATE / TIME: 201811161520
ISSUE DATE / TIME: 201811141358
ISSUE DATE / TIME: 201811141358
ISSUE DATE / TIME: 201811151138
ISSUE DATE / TIME: 201811171456
UNIT TYPE AND RH: 5100
UNIT TYPE AND RH: 5100
UNIT TYPE AND RH: 5100
Unit Type and Rh: 5100
Unit Type and Rh: 5100
Unit Type and Rh: 5100
Unit Type and Rh: 5100
Unit Type and Rh: 5100
Unit Type and Rh: 5100
Unit Type and Rh: 5100

## 2017-04-16 LAB — POCT I-STAT, CHEM 8
BUN: 14 mg/dL (ref 6–20)
CREATININE: 1 mg/dL (ref 0.61–1.24)
Calcium, Ion: 1.11 mmol/L — ABNORMAL LOW (ref 1.15–1.40)
Chloride: 93 mmol/L — ABNORMAL LOW (ref 101–111)
GLUCOSE: 138 mg/dL — AB (ref 65–99)
HEMATOCRIT: 34 % — AB (ref 39.0–52.0)
Hemoglobin: 11.6 g/dL — ABNORMAL LOW (ref 13.0–17.0)
Potassium: 3.4 mmol/L — ABNORMAL LOW (ref 3.5–5.1)
Sodium: 135 mmol/L (ref 135–145)
TCO2: 30 mmol/L (ref 22–32)

## 2017-04-16 LAB — COOXEMETRY PANEL
Carboxyhemoglobin: 1.5 % (ref 0.5–1.5)
Methemoglobin: 0.9 % (ref 0.0–1.5)
O2 Saturation: 85 %
Total hemoglobin: 6.7 g/dL — CL (ref 12.0–16.0)

## 2017-04-16 LAB — COMPREHENSIVE METABOLIC PANEL
ALT: 25 U/L (ref 17–63)
AST: 43 U/L — ABNORMAL HIGH (ref 15–41)
Albumin: 2.4 g/dL — ABNORMAL LOW (ref 3.5–5.0)
Alkaline Phosphatase: 49 U/L (ref 38–126)
Anion gap: 7 (ref 5–15)
BUN: 14 mg/dL (ref 6–20)
CO2: 27 mmol/L (ref 22–32)
Calcium: 7.8 mg/dL — ABNORMAL LOW (ref 8.9–10.3)
Chloride: 99 mmol/L — ABNORMAL LOW (ref 101–111)
Creatinine, Ser: 0.98 mg/dL (ref 0.61–1.24)
GFR calc Af Amer: >60 mL/min (ref >60)
GFR calc non Af Amer: >60 mL/min (ref >60)
Glucose, Bld: 114 mg/dL — ABNORMAL HIGH (ref 65–99)
Potassium: 3.6 mmol/L (ref 3.5–5.1)
Sodium: 133 mmol/L — ABNORMAL LOW (ref 135–145)
Total Bilirubin: 1.4 mg/dL — ABNORMAL HIGH (ref 0.3–1.2)
Total Protein: 5.1 g/dL — ABNORMAL LOW (ref 6.5–8.1)

## 2017-04-16 LAB — GLUCOSE, CAPILLARY
GLUCOSE-CAPILLARY: 129 mg/dL — AB (ref 65–99)
GLUCOSE-CAPILLARY: 139 mg/dL — AB (ref 65–99)
GLUCOSE-CAPILLARY: 150 mg/dL — AB (ref 65–99)
GLUCOSE-CAPILLARY: 94 mg/dL (ref 65–99)
Glucose-Capillary: 109 mg/dL — ABNORMAL HIGH (ref 65–99)
Glucose-Capillary: 117 mg/dL — ABNORMAL HIGH (ref 65–99)
Glucose-Capillary: 126 mg/dL — ABNORMAL HIGH (ref 65–99)

## 2017-04-16 LAB — APTT
APTT: 47 s — AB (ref 24–36)
APTT: 58 s — AB (ref 24–36)
aPTT: 65 seconds — ABNORMAL HIGH (ref 24–36)

## 2017-04-16 LAB — CBC
HCT: 32.7 % — ABNORMAL LOW (ref 39.0–52.0)
Hemoglobin: 10.5 g/dL — ABNORMAL LOW (ref 13.0–17.0)
MCH: 28.2 pg (ref 26.0–34.0)
MCHC: 32.1 g/dL (ref 30.0–36.0)
MCV: 87.9 fL (ref 78.0–100.0)
Platelets: 77 10*3/uL — ABNORMAL LOW (ref 150–400)
RBC: 3.72 MIL/uL — ABNORMAL LOW (ref 4.22–5.81)
RDW: 14.6 % (ref 11.5–15.5)
WBC: 15.8 10*3/uL — ABNORMAL HIGH (ref 4.0–10.5)

## 2017-04-16 LAB — LACTATE DEHYDROGENASE: LDH: 361 U/L — ABNORMAL HIGH (ref 98–192)

## 2017-04-16 LAB — POCT ACTIVATED CLOTTING TIME: ACTIVATED CLOTTING TIME: 164 s

## 2017-04-16 MED ORDER — SODIUM CHLORIDE 0.9 % IV SOLN
0.0500 mg/kg/h | INTRAVENOUS | Status: DC
  Administered 2017-04-16: 0.04 mg/kg/h via INTRAVENOUS
  Filled 2017-04-16 (×2): qty 250

## 2017-04-16 MED ORDER — FUROSEMIDE 10 MG/ML IJ SOLN
5.0000 mg/h | INTRAMUSCULAR | Status: DC
  Administered 2017-04-16 – 2017-04-17 (×2): 10 mg/h via INTRAVENOUS
  Administered 2017-04-18 – 2017-04-22 (×3): 5 mg/h via INTRAVENOUS
  Filled 2017-04-16 (×13): qty 25

## 2017-04-16 MED ORDER — NOREPINEPHRINE BITARTRATE 1 MG/ML IV SOLN
0.0000 ug/min | INTRAVENOUS | Status: DC
  Administered 2017-04-16: 1 ug/min via INTRAVENOUS
  Administered 2017-04-17: 10 ug/min via INTRAVENOUS
  Administered 2017-04-18: 1 ug/min via INTRAVENOUS
  Administered 2017-04-19: 6 ug/min via INTRAVENOUS
  Administered 2017-04-19: 4 ug/min via INTRAVENOUS
  Administered 2017-04-20 – 2017-04-21 (×2): 6 ug/min via INTRAVENOUS
  Administered 2017-04-21: 7 ug/min via INTRAVENOUS
  Administered 2017-04-22: 11 ug/min via INTRAVENOUS
  Administered 2017-04-22: 10 ug/min via INTRAVENOUS
  Administered 2017-04-22: 11 ug/min via INTRAVENOUS
  Administered 2017-04-23 (×2): 8 ug/min via INTRAVENOUS
  Filled 2017-04-16 (×14): qty 4

## 2017-04-16 MED ORDER — VITAL 1.5 CAL PO LIQD
1000.0000 mL | ORAL | Status: DC
  Filled 2017-04-16 (×3): qty 1000

## 2017-04-16 MED ORDER — ALBUMIN HUMAN 5 % IV SOLN
12.5000 g | Freq: Once | INTRAVENOUS | Status: AC
  Administered 2017-04-16: 12.5 g via INTRAVENOUS
  Filled 2017-04-16: qty 250

## 2017-04-16 MED ORDER — FUROSEMIDE 10 MG/ML IJ SOLN
20.0000 mg | Freq: Three times a day (TID) | INTRAMUSCULAR | Status: DC
  Administered 2017-04-16: 20 mg via INTRAVENOUS
  Filled 2017-04-16: qty 2

## 2017-04-16 MED ORDER — NOREPINEPHRINE BITARTRATE 1 MG/ML IV SOLN: 0.0000 ug/min | INTRAVENOUS | Status: DC

## 2017-04-16 MED ORDER — SODIUM CHLORIDE 0.9 % IV SOLN
0.0400 mg/kg/h | INTRAVENOUS | Status: DC
  Filled 2017-04-16: qty 250

## 2017-04-16 MED ORDER — POTASSIUM CHLORIDE 10 MEQ/50ML IV SOLN
10.0000 meq | INTRAVENOUS | Status: AC | PRN
  Administered 2017-04-16 – 2017-04-17 (×3): 10 meq via INTRAVENOUS
  Filled 2017-04-16 (×3): qty 50

## 2017-04-16 MED ORDER — DEXTROSE 5 % SOLN FOR IMPELLA PURGE CATHETER
INTRAVENOUS | Status: DC
  Administered 2017-04-16: 1000 mL
  Filled 2017-04-16: qty 1000

## 2017-04-16 MED ORDER — ALBUTEROL SULFATE (2.5 MG/3ML) 0.083% IN NEBU
2.5000 mg | INHALATION_SOLUTION | RESPIRATORY_TRACT | Status: DC | PRN
  Administered 2017-04-21: 2.5 mg via RESPIRATORY_TRACT
  Filled 2017-04-16: qty 3

## 2017-04-16 MED ORDER — SODIUM CHLORIDE 0.9 % IV SOLN
1250.0000 mg | Freq: Two times a day (BID) | INTRAVENOUS | Status: DC
  Administered 2017-04-16 – 2017-04-19 (×8): 1250 mg via INTRAVENOUS
  Filled 2017-04-16 (×9): qty 1250

## 2017-04-16 MED ORDER — POTASSIUM CHLORIDE 10 MEQ/50ML IV SOLN
10.0000 meq | INTRAVENOUS | Status: AC
  Administered 2017-04-16 (×3): 10 meq via INTRAVENOUS
  Filled 2017-04-16 (×3): qty 50

## 2017-04-16 MED ORDER — POTASSIUM CHLORIDE 10 MEQ/50ML IV SOLN
10.0000 meq | INTRAVENOUS | Status: AC | PRN
  Administered 2017-04-16 (×3): 10 meq via INTRAVENOUS
  Filled 2017-04-16 (×3): qty 50

## 2017-04-16 NOTE — Progress Notes (Signed)
CT surgery p.m. Rounds  LVAD flow remains 3.6 L/m Excellent urine output with decrease in CVP on Lasix drip Patient required increased sedation on ventilator to avoid*dyssynchrony Sinus rhythm on low-dose epi and norepinephrine PTT 65 on low-dose bivalirudin. Goal is 65-80  Tube feeds continue at 40 mL's per hour and CBGs well-controlled

## 2017-04-16 NOTE — Progress Notes (Signed)
impella outflow blocked alarms intermittently but has been self resolving throughout this shift. Imaging done per md. This morning alarm recurred but couldn't self resolved.  Level turn down from P5 to P4. Alarm has resolved now.CvP at this time is 10-11. Will continue to monitor hemodynamics and volume status closely

## 2017-04-16 NOTE — Addendum Note (Signed)
Addendum  created 04/16/17 1748 by Kipp BroodJoslin, Tevon Berhane, MD   Diagnosis association updated

## 2017-04-16 NOTE — Progress Notes (Signed)
Advanced Heart Failure Rounding Note   Subjective:    -S/P LV pseudoaneurysm repeat 11/14 Impella 5.0 Placed.  -Chest closed 11/16.   Remains on vent. Has been further sedated due to vent stacking. Had low flows on Impella this am after moving his R arm so repositioned by Dr. Prescott Gum and flows improved.   Started on broad spectrum abx on 11/17 for fever and leukocytosis.   More restless. Requiring increased sedation. Lasix gtt stopped. Weight recorded as up 10 pounds.Co-ox inaccurate as well. LDH ok at 361. Creatine stable. Hgb 10.5. PLTs stable at 77k. Switched to bival by Dr. Prescott Gum over concern for HIT. Lasix gtt stopped. On epi 2 neo 10  On bridge dose cangrelor. P2Y12 is inhibited at 100.   CXR with worsening edema and effusions   Swan #s CVP 16 PAP 41/24 CO 4.0 CI 1.9  Impella at P-6. Flow 3.6 L. Position assessed and adjusted under echo guidance. Now at 3.2 cm.   Objective:   Weight Range: 225 lb 5 oz (102.2 kg) Body mass index is 35.29 kg/m.   Vital Signs:   Temp:  [98.8 F (37.1 C)-100.2 F (37.9 C)] 99.3 F (37.4 C) (11/18 0900) Pulse Rate:  [44-84] 66 (11/18 0900) Resp:  [0-24] 22 (11/18 0900) BP: (83-124)/(58-88) 89/58 (11/18 0900) SpO2:  [91 %-100 %] 99 % (11/18 1114) Arterial Line BP: (72-142)/(50-73) 97/61 (11/18 0900) FiO2 (%):  [50 %-55 %] 50 % (11/18 1114) Weight:  [225 lb 5 oz (102.2 kg)] 225 lb 5 oz (102.2 kg) (11/18 0700) Last BM Date: (PTA)  Weight change: Filed Weights   04/14/17 0400 04/15/17 0411 04/16/17 0700  Weight: 216 lb 7.9 oz (98.2 kg) 214 lb 4.6 oz (97.2 kg) 225 lb 5 oz (102.2 kg)    Intake/Output:   Intake/Output Summary (Last 24 hours) at 04/16/2017 1137 Last data filed at 04/16/2017 0900 Gross per 24 hour  Intake 5189.27 ml  Output 2735 ml  Net 2454.27 ml      Physical Exam   General:  On vent. Intubated and sedated HEENT: normal x for ETT Neck: supple. JVP up. Carotids 2+ bilat; no bruits. No  lymphadenopathy or thryomegaly appreciated. R subclav impella. LIJ swan Cor: RRR + impella hum  + sternal dressing with CTand pacing wires Lungs: mechanical breath sounds  Abdomen: soft, nontender, + distended. No hepatosplenomegaly. No bruits or masses. Good bowel sounds. Extremities: no cyanosis, clubbing, rash, 2-3+ edema Neuro: intubated sedated    Telemetry   NSR 60-70s. Personally reviewed   EKG  N/A  Labs    CBC Recent Labs    04/15/17 0257 04/15/17 1538 04/16/17 0343  WBC 17.4*  --  15.8*  HGB 11.5* 11.9* 10.5*  HCT 34.5* 35.0* 32.7*  MCV 87.3  --  87.9  PLT 74*  --  77*   Basic Metabolic Panel Recent Labs    04/13/17 1615  04/15/17 0257 04/15/17 1538 04/16/17 0343  NA  --    < > 132* 134* 133*  K  --    < > 3.2* 3.5 3.6  CL  --    < > 98* 95* 99*  CO2  --    < > 28  --  27  GLUCOSE  --    < > 124* 135* 114*  BUN  --    < > 11 14 14   CREATININE 0.90   < > 0.92 1.00 0.98  CALCIUM  --    < >  8.0*  --  7.8*  MG 1.8  --   --   --   --    < > = values in this interval not displayed.   Liver Function Tests Recent Labs    04/15/17 0257 04/16/17 0343  AST 47* 43*  ALT 25 25  ALKPHOS 38 49  BILITOT 2.0* 1.4*  PROT 4.9* 5.1*  ALBUMIN 2.4* 2.4*   No results for input(s): LIPASE, AMYLASE in the last 72 hours. Cardiac Enzymes No results for input(s): CKTOTAL, CKMB, CKMBINDEX, TROPONINI in the last 72 hours.  BNP: BNP (last 3 results) No results for input(s): BNP in the last 8760 hours.  ProBNP (last 3 results) No results for input(s): PROBNP in the last 8760 hours.   D-Dimer No results for input(s): DDIMER in the last 72 hours. Hemoglobin A1C No results for input(s): HGBA1C in the last 72 hours. Fasting Lipid Panel No results for input(s): CHOL, HDL, LDLCALC, TRIG, CHOLHDL, LDLDIRECT in the last 72 hours. Thyroid Function Tests No results for input(s): TSH, T4TOTAL, T3FREE, THYROIDAB in the last 72 hours.  Invalid input(s): FREET3  Other  results:   Imaging    Dg Chest Port 1 View  Result Date: 04/16/2017 CLINICAL DATA:  ET tube EXAM: PORTABLE CHEST 1 VIEW COMPARISON:  04/15/2017 FINDINGS: Support devices are stable. Cardiomegaly with vascular congestion, layering bilateral effusions and bibasilar atelectasis. Aeration in the bases slightly worsened since prior study. IMPRESSION: Support devices stable. Layering bilateral effusions and bibasilar atelectasis, slightly increased since prior study. Cardiomegaly, vascular congestion Electronically Signed   By: Rolm Baptise M.D.   On: 04/16/2017 07:51   Dg Chest Port 1 View  Result Date: 04/15/2017 CLINICAL DATA:  Check Impella position EXAM: PORTABLE CHEST 1 VIEW COMPARISON:  Film from earlier in the same day. FINDINGS: Cardiac shadow remains enlarged. A Swan-Ganz catheter is noted in the right pulmonary artery. Endotracheal to and nasogastric catheter are again seen and stable. Right jugular central line is seen. An Impella is noted from a right upper extremity approach extending into the left ventricle. The overall appearance is stable when compared with the previous exam. Small bilateral pleural effusions are again seen. Improved aeration is noted in the right lung base when compared with the prior study. Stable atelectatic changes are noted in the left lung base. No pneumothorax is seen. Feeding catheter is also noted extending into the stomach. IMPRESSION: Tubes and lines as described above. The overall positioning of the Impella is stable given some slight difference in the positioning of the patient. Improved aeration in the right lung base is noted. Stable left basilar atelectasis and small effusions. Electronically Signed   By: Inez Catalina M.D.   On: 04/15/2017 21:28   Dg Abd Portable 1v  Result Date: 04/15/2017 CLINICAL DATA:  Feeding tube placement. EXAM: PORTABLE ABDOMEN - 1 VIEW COMPARISON:  04/14/2017 FINDINGS: Feeding tube is been placed which extends through the  stomach and duodenum with the tip located beyond the ligament of Treitz in the proximal jejunum. Nasogastric tube remains which is coiled in the stomach. IMPRESSION: The feeding tube extends through the stomach and duodenum with the tip located in the proximal jejunum. Electronically Signed   By: Aletta Edouard M.D.   On: 04/15/2017 13:51     Medications:     Scheduled Medications: . acetaminophen  1,000 mg Oral Q6H   Or  . acetaminophen (TYLENOL) oral liquid 160 mg/5 mL  1,000 mg Per Tube Q6H  . aspirin  81 mg Oral Daily  . bisacodyl  10 mg Oral Daily   Or  . bisacodyl  10 mg Rectal Daily  . chlorhexidine gluconate (MEDLINE KIT)  15 mL Mouth Rinse BID  . Chlorhexidine Gluconate Cloth  6 each Topical Daily  . fentaNYL (SUBLIMAZE) injection  50 mcg Intravenous Once  . furosemide  20 mg Intravenous Q8H  . insulin aspart  0-24 Units Subcutaneous Q4H  . mouth rinse  15 mL Mouth Rinse 10 times per day  . pantoprazole sodium  40 mg Per Tube Daily  . sodium chloride flush  10-40 mL Intracatheter Q12H  . sodium chloride flush  3 mL Intravenous Q12H    Infusions: . sodium chloride 20 mL/hr (04/16/17 0800)  . sodium chloride    . sodium chloride Stopped (04/14/17 1015)  . sodium chloride    . amiodarone 30 mg/hr (04/16/17 0414)  . bivalirudin (ANGIOMAX) infusion 0.5 mg/mL (Non-ACS indications) 0.04 mg/kg/hr (04/16/17 1039)  . cangrelor 50 mg in NS 250 mL 0.75 mcg/kg/min (04/16/17 0738)  . dexmedetomidine (PRECEDEX) IV infusion 0.7 mcg/kg/hr (04/16/17 1112)  . dextrose 5 % Impella 5.0 Purge solution    . epinephrine 2 mcg/min (04/15/17 2304)  . feeding supplement (VITAL 1.5 CAL) 1,000 mL (04/15/17 1800)  . fentaNYL infusion INTRAVENOUS 200 mcg/hr (04/16/17 1105)  . lactated ringers    . lactated ringers 20 mL/hr at 04/15/17 1800  . lactated ringers Stopped (04/16/17 4503)  . nitroGLYCERIN Stopped (04/12/17 2200)  . phenylephrine (NEO-SYNEPHRINE) Adult infusion 15 mcg/min (04/15/17  1800)  . piperacillin-tazobactam (ZOSYN)  IV Stopped (04/16/17 1034)  . vancomycin Stopped (04/16/17 1230)    PRN Medications: sodium chloride, albuterol, fentaNYL, lactated ringers, midazolam, ondansetron (ZOFRAN) IV, sodium chloride flush, sodium chloride flush    Patient Profile    [name] is 66 year old with h/o MI, HTN, COPD admitted with chest pain. EKG with evidence of inferior/lateral STEMI. Due to lethargy and hypoxemia he was intubated in the ED.  Taken to cath lab with RCA DES and POBA to chronically occluded LAD with IABP placed.   Had LV pseudo aneurysm repair 11/14 .     Assessment/Plan   1. CAD with OOH anterior MI c/b LV pseudoaneurysm and cardiogenic shock - attempted PCI of LAD failed due to chronic occlusion and extensive infarct - s/p PCI/DES to distal RCA on bridge dose cangrelor now. P2Y12 inhibited  2.  S/P LV pseudoaneurysm repair 11/14 -Chest closed 04/13/17  3. Acute systolic HF-> cardiogenic shock - remains with Impella support. EF ~25% - Impella repositioned by Dr. Prescott Gum earlier this am. Waveforms look good. I confirmed and adjusted position slightly under echo guidance today. Now at 3.2cm. Flow 3.6L on P-6 .  - RV moderately HK on echo - Swan numebers and CXR with worsening fluid overload. Restart lasix gtt at 10 - Continue epi. Will switch neo to norepi for BP support. No milrinone yet with low BPs - D/w Dr. Prescott Gum. Plan removal of Impella on Tuesday - LDH stable  4. Acute Hypoxic Respiratory Failure  - Intubated 11/13  - Volume status worse today. More agitated. CXR worse (Personally reviewed) - Keep intubated until Impella removal on Tuesday - Resume diuresis with IV lasix   5. AKI  - due to ATN shock  - resolved  6. COPD - stable  7. Thrombocytopenia - stable at 75K. HIT panel borderline. Now on Bival  8. Hypokalemia - will supp  9. Fever - Day 2 vanc/zosyn -  Cultures NGTD  CRITICAL CARE Performed by: Glori Bickers  Total critical care time: 45 minutes  Critical care time was exclusive of separately billable procedures and treating other patients.  Critical care was necessary to treat or prevent imminent or life-threatening deterioration.  Critical care was time spent personally by me (independent of midlevel providers or residents) on the following activities: development of treatment plan with patient and/or surrogate as well as nursing, discussions with consultants, evaluation of patient's response to treatment, examination of patient, obtaining history from patient or surrogate, ordering and performing treatments and interventions, ordering and review of laboratory studies, ordering and review of radiographic studies, pulse oximetry and re-evaluation of patient's condition.    Length of Stay: [place], MD  04/16/2017, 11:37 AM  Advanced Heart Failure Team Pager [phone] (M-F; 7a - 4p)  Please contact [facility] for night-coverage after hours (4p -7a ) and weekends on [email or web address]

## 2017-04-16 NOTE — Progress Notes (Signed)
Called to bedside to assess for ventilatory dyssynchrony (breath stacking), increased peak pressures.  Patient with coarse bilateral breath sounds, mild agitation / BP up.  Minimal secretions from ETT per RT.  RN reports hypotension from sedation bolus.  Remains on pressors.  Dr. Donata ClayVan Trigt at bedside as well.  Patient adjusted to PCV with improvement in ventilator mechanics.  Sedation increased / patient calmer.  Peak pressures improved.    Plan: Sedation / pressors as needed for ventilator synchrony  Change PRVC to PCV Will continue to monitor.    Ronald BrimBrandi Daniyla Pfahler, NP-C [facility] Pgr: [phone] or if no answer [phone] 04/16/2017, 11:15 AM

## 2017-04-16 NOTE — Progress Notes (Addendum)
2 Days Post-Op Procedure(s) (LRB): STERNAL CLOSURE (N/A) TRANSESOPHAGEAL ECHOCARDIOGRAM (TEE) (N/A) APPLICATION OF WOUND VAC with reposition of Impella device (N/A) Subjective: After patient raised R arm VAD flows dropped , 2 L with stable BP and CI Volume given, lasix dripoff VAD repositioned [under sterile technique] back 1.5 cm, now flow back to > 3L With drop in flowwillneed systemic anticoagulation. Plts still low and HIT mildly positive so will stop heparin in purge and start systemic lowdose bival and monitor PT  Objective: Vital signs in last 24 hours: Temp:  [98.8 F (37.1 C)-100.2 F (37.9 C)] 99.3 F (37.4 C) (11/18 0900) Pulse Rate:  [38-84] 66 (11/18 0900) Cardiac Rhythm: Normal sinus rhythm (11/18 0800) Resp:  [0-24] 22 (11/18 0900) BP: (83-124)/(58-88) 89/58 (11/18 0900) SpO2:  [91 %-100 %] 100 % (11/18 0900) Arterial Line BP: (72-142)/(50-73) 97/61 (11/18 0900) FiO2 (%):  [50 %-55 %] 50 % (11/18 0850) Weight:  [225 lb 5 oz (102.2 kg)] 225 lb 5 oz (102.2 kg) (11/18 0700)  Hemodynamic parameters for last 24 hours: PAP: (23-43)/(13-24) 39/20 CVP:  [4 mmHg-18 mmHg] 13 mmHg PCWP:  [19 mmHg] 19 mmHg CO:  [4.1 L/min-4.6 L/min] 4.5 L/min CI:  [2 L/min/m2-2.2 L/min/m2] 2.1 L/min/m2  Intake/Output from previous day: 11/17 0701 - 11/18 0700 In: 6036.9 [I.V.:3526.4; NG/GT:908.7; IV Piggyback:1150] Out: 3310 [Urine:2660; Emesis/NG output:550; Chest Tube:100] Intake/Output this shift: Total I/O In: 124.2 [I.V.:124.2] Out: -        Exam    General- alert and comfortable   Lungs- clear without rales, wheezes   Cor- regular rate and rhythm, no murmur , gallop   Abdomen- soft, non-tender   Extremities - warm, non-tender, minimal edema   Neuro- oriented, appropriate, no focal weakness   Lab Results: Recent Labs    04/15/17 0257 04/15/17 1538 04/16/17 0343  WBC 17.4*  --  15.8*  HGB 11.5* 11.9* 10.5*  HCT 34.5* 35.0* 32.7*  PLT 74*  --  77*   BMET:  Recent  Labs    04/15/17 0257 04/15/17 1538 04/16/17 0343  NA 132* 134* 133*  K 3.2* 3.5 3.6  CL 98* 95* 99*  CO2 28  --  27  GLUCOSE 124* 135* 114*  BUN 11 14 14   CREATININE 0.92 1.00 0.98  CALCIUM 8.0*  --  7.8*    PT/INR: No results for input(s): LABPROT, INR in the last 72 hours. ABG    Component Value Date/Time   PHART 7.419 04/16/2017 0420   HCO3 28.9 (H) 04/16/2017 0420   TCO2 27 04/15/2017 1538   ACIDBASEDEF 4.0 (H) 04/13/2017 0357   O2SAT 98.4 04/16/2017 0420   CBG (last 3)  Recent Labs    04/16/17 0041 04/16/17 0355 04/16/17 0745  GLUCAP 150* 109* 117*    Assessment/Plan: S/P Procedure(s) (LRB): STERNAL CLOSURE (N/A) TRANSESOPHAGEAL ECHOCARDIOGRAM (TEE) (N/A) APPLICATION OF WOUND VAC with reposition of Impella device (N/A)   Plan VAD removal  tues Cont tube feeds Lasix 20 iv Q 8hr supp potassium Kathlee Nationseter Van Trigt III 04/16/2017

## 2017-04-17 ENCOUNTER — Encounter (HOSPITAL_COMMUNITY): Payer: Self-pay | Admitting: Cardiothoracic Surgery

## 2017-04-17 ENCOUNTER — Encounter (HOSPITAL_COMMUNITY)
Admission: EM | Disposition: A | Discharge status: Rehabilitation Facility | Payer: Self-pay | Source: Home / Self Care | Attending: Cardiothoracic Surgery

## 2017-04-17 ENCOUNTER — Inpatient Hospital Stay (HOSPITAL_COMMUNITY): Payer: Medicare Other

## 2017-04-17 ENCOUNTER — Inpatient Hospital Stay (HOSPITAL_COMMUNITY): Payer: Medicare Other | Admitting: Anesthesiology

## 2017-04-17 DIAGNOSIS — R570 Cardiogenic shock: Secondary | ICD-10-CM

## 2017-04-17 DIAGNOSIS — T82868A Thrombosis of vascular prosthetic devices, implants and grafts, initial encounter: Secondary | ICD-10-CM

## 2017-04-17 HISTORY — PX: REMOVAL OF IMPELLA LEFT VENTRICULAR ASSIST DEVICE: SHX6556

## 2017-04-17 LAB — GLUCOSE, CAPILLARY
GLUCOSE-CAPILLARY: 108 mg/dL — AB (ref 65–99)
GLUCOSE-CAPILLARY: 134 mg/dL — AB (ref 65–99)
Glucose-Capillary: 122 mg/dL — ABNORMAL HIGH (ref 65–99)
Glucose-Capillary: 115 mg/dL — ABNORMAL HIGH (ref 65–99)
Glucose-Capillary: 125 mg/dL — ABNORMAL HIGH (ref 65–99)
Glucose-Capillary: 131 mg/dL — ABNORMAL HIGH (ref 65–99)

## 2017-04-17 LAB — COMPREHENSIVE METABOLIC PANEL
ALT: 27 U/L (ref 17–63)
AST: 33 U/L (ref 15–41)
Albumin: 2.4 g/dL — ABNORMAL LOW (ref 3.5–5.0)
Alkaline Phosphatase: 57 U/L (ref 38–126)
Anion gap: 7 (ref 5–15)
BUN: 12 mg/dL (ref 6–20)
CO2: 32 mmol/L (ref 22–32)
Calcium: 7.7 mg/dL — ABNORMAL LOW (ref 8.9–10.3)
Chloride: 95 mmol/L — ABNORMAL LOW (ref 101–111)
Creatinine, Ser: 0.99 mg/dL (ref 0.61–1.24)
GFR calc Af Amer: >60 mL/min (ref >60)
GFR calc non Af Amer: >60 mL/min (ref >60)
Glucose, Bld: 130 mg/dL — ABNORMAL HIGH (ref 65–99)
Potassium: 3.3 mmol/L — ABNORMAL LOW (ref 3.5–5.1)
Sodium: 134 mmol/L — ABNORMAL LOW (ref 135–145)
Total Bilirubin: 1 mg/dL (ref 0.3–1.2)
Total Protein: 5.1 g/dL — ABNORMAL LOW (ref 6.5–8.1)

## 2017-04-17 LAB — POCT I-STAT, CHEM 8
BUN: 13 mg/dL (ref 6–20)
BUN: 15 mg/dL (ref 6–20)
CALCIUM ION: 1.08 mmol/L — AB (ref 1.15–1.40)
CALCIUM ION: 1.1 mmol/L — AB (ref 1.15–1.40)
CHLORIDE: 91 mmol/L — AB (ref 101–111)
CHLORIDE: 92 mmol/L — AB (ref 101–111)
CREATININE: 1.1 mg/dL (ref 0.61–1.24)
Creatinine, Ser: 1 mg/dL (ref 0.61–1.24)
GLUCOSE: 139 mg/dL — AB (ref 65–99)
Glucose, Bld: 148 mg/dL — ABNORMAL HIGH (ref 65–99)
HCT: 30 % — ABNORMAL LOW (ref 39.0–52.0)
HEMATOCRIT: 32 % — AB (ref 39.0–52.0)
HEMOGLOBIN: 10.2 g/dL — AB (ref 13.0–17.0)
Hemoglobin: 10.9 g/dL — ABNORMAL LOW (ref 13.0–17.0)
POTASSIUM: 3.5 mmol/L (ref 3.5–5.1)
Potassium: 4.1 mmol/L (ref 3.5–5.1)
SODIUM: 135 mmol/L (ref 135–145)
Sodium: 134 mmol/L — ABNORMAL LOW (ref 135–145)
TCO2: 32 mmol/L (ref 22–32)
TCO2: 32 mmol/L (ref 22–32)

## 2017-04-17 LAB — BLOOD GAS, ARTERIAL
ACID-BASE EXCESS: 8 mmol/L — AB (ref 0.0–2.0)
BICARBONATE: 33.5 mmol/L — AB (ref 20.0–28.0)
DRAWN BY: 41422
FIO2: 40
O2 Saturation: 97.8 %
PATIENT TEMPERATURE: 100.4
PCO2 ART: 65.1 mmHg — AB (ref 32.0–48.0)
PEEP: 5 cmH2O
PRESSURE CONTROL: 26 cmH2O
RATE: 16 resp/min
pH, Arterial: 7.338 — ABNORMAL LOW (ref 7.350–7.450)
pO2, Arterial: 112 mmHg — ABNORMAL HIGH (ref 83.0–108.0)

## 2017-04-17 LAB — COOXEMETRY PANEL
CARBOXYHEMOGLOBIN: 1.3 % (ref 0.5–1.5)
Methemoglobin: 0.8 % (ref 0.0–1.5)
O2 Saturation: 61.1 %
TOTAL HEMOGLOBIN: 10.5 g/dL — AB (ref 12.0–16.0)

## 2017-04-17 LAB — CBC
HCT: 31.9 % — ABNORMAL LOW (ref 39.0–52.0)
Hemoglobin: 10 g/dL — ABNORMAL LOW (ref 13.0–17.0)
MCH: 28.3 pg (ref 26.0–34.0)
MCHC: 31.3 g/dL (ref 30.0–36.0)
MCV: 90.4 fL (ref 78.0–100.0)
Platelets: 102 10*3/uL — ABNORMAL LOW (ref 150–400)
RBC: 3.53 MIL/uL — ABNORMAL LOW (ref 4.22–5.81)
RDW: 15 % (ref 11.5–15.5)
WBC: 11.9 10*3/uL — ABNORMAL HIGH (ref 4.0–10.5)

## 2017-04-17 LAB — TYPE AND SCREEN
ABO/RH(D): O POS
ANTIBODY SCREEN: NEGATIVE

## 2017-04-17 LAB — CULTURE, RESPIRATORY W GRAM STAIN
Culture: NORMAL
Gram Stain: NONE SEEN
Special Requests: NORMAL

## 2017-04-17 LAB — APTT
APTT: 68 s — AB (ref 24–36)
APTT: 44 s — AB (ref 24–36)

## 2017-04-17 LAB — LACTATE DEHYDROGENASE: LDH: 281 U/L — ABNORMAL HIGH (ref 98–192)

## 2017-04-17 SURGERY — REMOVAL, CARDIAC ASSIST DEVICE, IMPELLA
Anesthesia: General

## 2017-04-17 MED ORDER — ENOXAPARIN SODIUM 40 MG/0.4ML ~~LOC~~ SOLN
40.0000 mg | SUBCUTANEOUS | Status: DC
  Administered 2017-04-17 – 2017-04-26 (×10): 40 mg via SUBCUTANEOUS
  Filled 2017-04-17 (×10): qty 0.4

## 2017-04-17 MED ORDER — DEXMEDETOMIDINE HCL IN NACL 400 MCG/100ML IV SOLN
0.1000 ug/kg/h | INTRAVENOUS | Status: DC
  Filled 2017-04-17: qty 100

## 2017-04-17 MED ORDER — PLASMA-LYTE 148 IV SOLN
INTRAVENOUS | Status: DC
  Filled 2017-04-17: qty 2.5

## 2017-04-17 MED ORDER — VITAL HIGH PROTEIN PO LIQD
1000.0000 mL | ORAL | Status: DC
  Administered 2017-04-17 – 2017-04-27 (×10): 1000 mL

## 2017-04-17 MED ORDER — EPINEPHRINE PF 1 MG/ML IJ SOLN
0.0000 ug/min | INTRAVENOUS | Status: DC
  Filled 2017-04-17: qty 4

## 2017-04-17 MED ORDER — MILRINONE LACTATE IN DEXTROSE 20-5 MG/100ML-% IV SOLN
0.1250 ug/kg/min | INTRAVENOUS | Status: DC
  Filled 2017-04-17: qty 100

## 2017-04-17 MED ORDER — TRANEXAMIC ACID 1000 MG/10ML IV SOLN
1.5000 mg/kg/h | INTRAVENOUS | Status: DC
  Filled 2017-04-17: qty 25

## 2017-04-17 MED ORDER — DOPAMINE-DEXTROSE 3.2-5 MG/ML-% IV SOLN
0.0000 ug/kg/min | INTRAVENOUS | Status: DC
  Filled 2017-04-17: qty 250

## 2017-04-17 MED ORDER — HEPARIN SODIUM (PORCINE) 1000 UNIT/ML IJ SOLN
INTRAMUSCULAR | Status: DC
  Filled 2017-04-17: qty 30

## 2017-04-17 MED ORDER — LACTATED RINGERS IV SOLN
INTRAVENOUS | Status: DC | PRN
  Administered 2017-04-17: 12:00:00 via INTRAVENOUS

## 2017-04-17 MED ORDER — FENTANYL CITRATE (PF) 250 MCG/5ML IJ SOLN
INTRAMUSCULAR | Status: AC
  Filled 2017-04-17: qty 5

## 2017-04-17 MED ORDER — 0.9 % SODIUM CHLORIDE (POUR BTL) OPTIME
TOPICAL | Status: DC | PRN
  Administered 2017-04-17: 2000 mL

## 2017-04-17 MED ORDER — ROCURONIUM BROMIDE 100 MG/10ML IV SOLN
INTRAVENOUS | Status: DC | PRN
  Administered 2017-04-17 (×2): 50 mg via INTRAVENOUS

## 2017-04-17 MED ORDER — CEFUROXIME SODIUM 1.5 G IV SOLR
1.5000 g | INTRAVENOUS | Status: DC
  Filled 2017-04-17: qty 1.5

## 2017-04-17 MED ORDER — GERHARDT'S BUTT CREAM
TOPICAL_CREAM | CUTANEOUS | Status: DC | PRN
  Administered 2017-04-18: 05:00:00 via TOPICAL
  Administered 2017-04-18: 1 via TOPICAL
  Administered 2017-04-18: 08:00:00 via TOPICAL
  Filled 2017-04-17: qty 1

## 2017-04-17 MED ORDER — NITROGLYCERIN IN D5W 200-5 MCG/ML-% IV SOLN
2.0000 ug/min | INTRAVENOUS | Status: DC
Start: 2017-04-17 — End: 2017-04-17
  Filled 2017-04-17: qty 250

## 2017-04-17 MED ORDER — HEPARIN SODIUM (PORCINE) 5000 UNIT/ML IJ SOLN
50000.0000 [IU] | INTRAVENOUS | Status: DC
  Filled 2017-04-17: qty 10

## 2017-04-17 MED ORDER — SODIUM CHLORIDE 0.9 % IV SOLN
INTRAVENOUS | Status: DC
  Filled 2017-04-17: qty 1

## 2017-04-17 MED ORDER — VANCOMYCIN HCL 10 G IV SOLR
1250.0000 mg | INTRAVENOUS | Status: DC
  Filled 2017-04-17 (×2): qty 1250

## 2017-04-17 MED ORDER — TRANEXAMIC ACID (OHS) BOLUS VIA INFUSION
15.0000 mg/kg | INTRAVENOUS | Status: DC
  Filled 2017-04-17: qty 1482

## 2017-04-17 MED ORDER — DEXTROSE 5 % IV SOLN
750.0000 mg | INTRAVENOUS | Status: DC
  Filled 2017-04-17: qty 750

## 2017-04-17 MED ORDER — MAGNESIUM SULFATE 50 % IJ SOLN
40.0000 meq | INTRAMUSCULAR | Status: DC
  Filled 2017-04-17: qty 9.85

## 2017-04-17 MED ORDER — SODIUM CHLORIDE 0.9 % IV SOLN
30.0000 ug/min | INTRAVENOUS | Status: DC
  Filled 2017-04-17: qty 2

## 2017-04-17 MED ORDER — SODIUM CHLORIDE 0.9 % IV SOLN
0.7500 ug/kg/min | INTRAVENOUS | Status: DC
  Administered 2017-04-17: 0.75 ug/kg/min via INTRAVENOUS
  Filled 2017-04-17 (×2): qty 50

## 2017-04-17 MED ORDER — PRO-STAT SUGAR FREE PO LIQD
30.0000 mL | Freq: Two times a day (BID) | ORAL | Status: DC
  Administered 2017-04-17 – 2017-04-18 (×4): 30 mL via ORAL
  Filled 2017-04-17 (×4): qty 30

## 2017-04-17 MED ORDER — PHENYLEPHRINE HCL 10 MG/ML IJ SOLN
INTRAVENOUS | Status: DC | PRN
  Administered 2017-04-17: 30 ug/min via INTRAVENOUS

## 2017-04-17 MED ORDER — POTASSIUM CHLORIDE 2 MEQ/ML IV SOLN
80.0000 meq | INTRAVENOUS | Status: DC
  Filled 2017-04-17: qty 40

## 2017-04-17 MED ORDER — POTASSIUM CHLORIDE 10 MEQ/50ML IV SOLN
10.0000 meq | INTRAVENOUS | Status: AC | PRN
  Administered 2017-04-17 – 2017-04-18 (×3): 10 meq via INTRAVENOUS
  Filled 2017-04-17 (×6): qty 50

## 2017-04-17 MED ORDER — MIDAZOLAM HCL 2 MG/2ML IJ SOLN
INTRAMUSCULAR | Status: AC
  Filled 2017-04-17: qty 2

## 2017-04-17 MED ORDER — POTASSIUM CHLORIDE 10 MEQ/50ML IV SOLN
10.0000 meq | INTRAVENOUS | Status: AC
  Administered 2017-04-17 (×5): 10 meq via INTRAVENOUS
  Filled 2017-04-17 (×3): qty 50

## 2017-04-17 MED ORDER — TRANEXAMIC ACID (OHS) PUMP PRIME SOLUTION
2.0000 mg/kg | INTRAVENOUS | Status: DC
Start: 2017-04-17 — End: 2017-04-17
  Filled 2017-04-17: qty 1.98

## 2017-04-17 SURGICAL SUPPLY — 55 items
BAG URIMETER BARDEX IC 350 (UROLOGICAL SUPPLIES) IMPLANT
BLADE SURG 11 STRL SS (BLADE) IMPLANT
CANISTER SUCT 3000ML PPV (MISCELLANEOUS) ×2 IMPLANT
CATH FOLEY 2WAY SLVR  5CC 16FR (CATHETERS)
CATH FOLEY 2WAY SLVR 5CC 16FR (CATHETERS) IMPLANT
CATH THORACIC 28FR (CATHETERS) IMPLANT
CATH THORACIC 28FR RT ANG (CATHETERS) IMPLANT
CLIP TI MEDIUM 6 (CLIP) ×2 IMPLANT
CLIP TI WIDE RED SMALL 6 (CLIP) ×2 IMPLANT
CLIP VESOCCLUDE SM WIDE 24/CT (CLIP) IMPLANT
CONT SPEC 4OZ CLIKSEAL STRL BL (MISCELLANEOUS) IMPLANT
COVER SURGICAL LIGHT HANDLE (MISCELLANEOUS) IMPLANT
DERMABOND ADVANCED (GAUZE/BANDAGES/DRESSINGS) ×1
DERMABOND ADVANCED .7 DNX12 (GAUZE/BANDAGES/DRESSINGS) ×1 IMPLANT
DRAPE CARDIOVASCULAR INCISE (DRAPES) ×1
DRAPE LAPAROSCOPIC ABDOMINAL (DRAPES) IMPLANT
DRAPE SRG 135X102X78XABS (DRAPES) ×1 IMPLANT
DRSG AQUACEL AG ADV 3.5X14 (GAUZE/BANDAGES/DRESSINGS) ×2 IMPLANT
ELECT BLADE 4.0 EZ CLEAN MEGAD (MISCELLANEOUS) ×2
ELECT REM PT RETURN 9FT ADLT (ELECTROSURGICAL) ×4
ELECTRODE BLDE 4.0 EZ CLN MEGD (MISCELLANEOUS) ×1 IMPLANT
ELECTRODE REM PT RTRN 9FT ADLT (ELECTROSURGICAL) ×2 IMPLANT
GAUZE SPONGE 4X4 12PLY STRL (GAUZE/BANDAGES/DRESSINGS) ×2 IMPLANT
GAUZE SPONGE 4X4 12PLY STRL LF (GAUZE/BANDAGES/DRESSINGS) ×2 IMPLANT
GEL ULTRASOUND 20GR AQUASONIC (MISCELLANEOUS) ×2 IMPLANT
GLOVE BIO SURGEON STRL SZ 6.5 (GLOVE) ×8 IMPLANT
GLOVE BIOGEL PI IND STRL 6.5 (GLOVE) ×4 IMPLANT
GLOVE BIOGEL PI INDICATOR 6.5 (GLOVE) ×4
GOWN STRL REUS W/ TWL LRG LVL3 (GOWN DISPOSABLE) ×4 IMPLANT
GOWN STRL REUS W/TWL LRG LVL3 (GOWN DISPOSABLE) ×4
HEMOSTAT POWDER SURGIFOAM 1G (HEMOSTASIS) IMPLANT
KIT BASIN OR (CUSTOM PROCEDURE TRAY) ×2 IMPLANT
KIT ROOM TURNOVER OR (KITS) ×2 IMPLANT
KIT SUCTION CATH 14FR (SUCTIONS) IMPLANT
NS IRRIG 1000ML POUR BTL (IV SOLUTION) ×4 IMPLANT
PACK CHEST (CUSTOM PROCEDURE TRAY) ×2 IMPLANT
PAD ARMBOARD 7.5X6 YLW CONV (MISCELLANEOUS) ×4 IMPLANT
RELOAD TRI 2.0 30 VAS MED SUL (STAPLE) ×2 IMPLANT
STAPLER ENDO GIA 12MM SHORT (STAPLE) ×2 IMPLANT
SUT PROLENE 4 0 RB 1 (SUTURE)
SUT PROLENE 4-0 RB1 .5 CRCL 36 (SUTURE) IMPLANT
SUT SILK 2 0 SH CR/8 (SUTURE) IMPLANT
SUT STEEL 6MS V (SUTURE) IMPLANT
SUT VIC AB 1 CT1 18XCR BRD 8 (SUTURE) IMPLANT
SUT VIC AB 1 CT1 8-18 (SUTURE)
SUT VIC AB 1 CTX 18 (SUTURE) ×2 IMPLANT
SUT VIC AB 2-0 CTX 27 (SUTURE) ×2 IMPLANT
SUT VIC AB 2-0 CTX 36 (SUTURE) IMPLANT
SUT VIC AB 3-0 X1 27 (SUTURE) ×2 IMPLANT
SYSTEM SAHARA CHEST DRAIN RE-I (WOUND CARE) IMPLANT
TAPE CLOTH SURG 4X10 WHT LF (GAUZE/BANDAGES/DRESSINGS) ×2 IMPLANT
TOWEL GREEN STERILE (TOWEL DISPOSABLE) ×2 IMPLANT
TOWEL OR 17X24 6PK STRL BLUE (TOWEL DISPOSABLE) IMPLANT
TOWEL OR 17X26 10 PK STRL BLUE (TOWEL DISPOSABLE) IMPLANT
WATER STERILE IRR 1000ML POUR (IV SOLUTION) ×4 IMPLANT

## 2017-04-17 NOTE — Progress Notes (Signed)
Pre Procedure note for inpatients:   [name] has been scheduled for Procedure(s): REMOVAL OF IMPELLA LEFT VENTRICULAR ASSIST DEVICE (N/A) today. The various methods of treatment have been discussed with the patient. After consideration of the risks, benefits and treatment options the patient has consented to the planned procedure.   The patient has been seen and labs reviewed. There are no changes in the patient's condition to prevent proceeding with the planned procedure today.  Recent labs:  Lab Results  Component Value Date   WBC 11.9 (H) 04/17/2017   HGB 10.0 (L) 04/17/2017   HCT 31.9 (L) 04/17/2017   PLT 102 (L) 04/17/2017   GLUCOSE 130 (H) 04/17/2017   CHOL 167 04/11/2017   TRIG 109 04/11/2017   HDL 27 (L) 04/11/2017   LDLCALC 118 (H) 04/11/2017   ALT 27 04/17/2017   AST 33 04/17/2017   NA 134 (L) 04/17/2017   K 3.3 (L) 04/17/2017   CL 95 (L) 04/17/2017   CREATININE 0.99 04/17/2017   BUN 12 04/17/2017   CO2 32 04/17/2017   INR 0.99 04/12/2017   Discussed the case with Dr.Bensimone , pressures on the Impala purge circuit, indicating likely clot.  With the patient on low pressors and overall improving we will plan to take the impeller at expediently today. Will attempt to contact the patient's sister, for consent   Ronald OvensEdward B Wilmary Levit, MD 04/17/2017 11:21 AM

## 2017-04-17 NOTE — Anesthesia Procedure Notes (Signed)
Date/Time: 04/17/2017 12:47 PM Performed by: Rogelia BogaMueller, Rawson Minix P, CRNA Pre-anesthesia Checklist: Patient identified, Emergency Drugs available, Suction available and Patient being monitored Patient Re-evaluated:Patient Re-evaluated prior to induction Oxygen Delivery Method: Circle system utilized Preoxygenation: Pre-oxygenation with 100% oxygen Induction Type: Inhalational induction with existing ETT Placement Confirmation: positive ETCO2 and breath sounds checked- equal and bilateral Secured at: 26 (26 at the lips) cm Tube secured with: ICU tube holder.

## 2017-04-17 NOTE — Progress Notes (Addendum)
PULMONARY / CRITICAL CARE MEDICINE   Name: [name] MRN: [id number] DOB: [birth year]    ADMISSION DATE:  04/11/2017 CONSULTATION DATE:  04/11/17  REFERRING MD:  Eldridge Dace  CHIEF COMPLAINT:  STEMI  HISTORY OF PRESENT ILLNESS:  [name] is a 66 y.o. male with PMH of CAD and HTN.  He presented to [facility] ED 11/13 with sudden onset of SOB and chest pain.  He was found to have inferolateral STEMI and was taken urgently to cath lab.  Prior to cath lab, he was intubated; therefore, PCCM asked to see in consultation to assist with vent management.  In cath lab, he was found to have 100% stenosed mid LAD lesion that was balloon angioplastied with 25% residual stenosis, distal RCA lesion which had DES placed, Ost 1st diag lesion 50% stenosed, proximal ramus 70% stenosed.  IABP was also placed. Has had ongoing shock, pressor dependent.   SUBJECTIVE:   No events overnight, no significant changes, sedated  VITAL SIGNS: BP 108/71   Pulse 89   Temp (!) 100.4 F (38 C)   Resp (!) 9   Ht 5\' 7"  (1.702 m)   Wt 98.8 kg (217 lb 13 oz)   SpO2 96%   BMI 34.11 kg/m   HEMODYNAMICS: PAP: (31-54)/(13-29) 37/24 CVP:  [8 mmHg-20 mmHg] 17 mmHg PCWP:  [17 mmHg-20 mmHg] 17 mmHg CO:  [4 L/min-5.6 L/min] 4.5 L/min CI:  [1.9 L/min/m2-2.7 L/min/m2] 2.1 L/min/m2  VENTILATOR SETTINGS: Vent Mode: PCV FiO2 (%):  [40 %-50 %] 40 % Set Rate:  [16 bmp] 16 bmp PEEP:  [5 cmH20] 5 cmH20 Plateau Pressure:  [15 cmH20-29 cmH20] 27 cmH20  INTAKE / OUTPUT: I/O last 3 completed shifts: In: 9655.7 [I.V.:5468.6; Other:625.7; NG/GT:1911.3; IV Piggyback:1650] Out: 8645 [Urine:7205; Emesis/NG output:1300; Chest Tube:140]   PHYSICAL EXAMINATION: General: Adult male, acutely ill appearing, NAD  Neuro: sedated, RASS -1, opens eyes to voice, follows commands, intermittent agitation  HEENT: [place]/AT. PERRL, sclerae anicteric. Cardiovascular: RRR, no M/R/G. R chest impala, sternal dressing c/d Lungs: Respirations even and  unlabored on pressure control.  Essentially clear. Chest tubes intact, no air leak  Abdomen: BS x 4, soft, NT/ND.  Musculoskeletal: No gross deformities, no edema.  Skin: Pale, warm, no rashes.   LABS:  BMET Recent Labs  Lab 04/15/17 0257  04/16/17 0343 04/16/17 1543 04/16/17 2321 04/17/17 0504  NA 132*   < > 133* 135 135 134*  K 3.2*   < > 3.6 3.4* 3.5 3.3*  CL 98*   < > 99* 93* 91* 95*  CO2 28  --  27  --   --  32  BUN 11   < > 14 14 13 12   CREATININE 0.92   < > 0.98 1.00 1.00 0.99  GLUCOSE 124*   < > 114* 138* 148* 130*   < > = values in this interval not displayed.    Electrolytes Recent Labs  Lab 04/12/17 1248 04/13/17 0348 04/13/17 1615  04/15/17 0257 04/16/17 0343 04/17/17 0504  CALCIUM  --  8.9  --    < > 8.0* 7.8* 7.7*  MG 1.6* 2.3 1.8  --   --   --   --   PHOS 2.8  --   --   --   --   --   --    < > = values in this interval not displayed.    CBC Recent Labs  Lab 04/15/17 0257  04/16/17 0343 04/16/17 1543 04/16/17 2321 04/17/17 4098  WBC 17.4*  --  15.8*  --   --  11.9*  HGB 11.5*   < > 10.5* 11.6* 10.9* 10.0*  HCT 34.5*   < > 32.7* 34.0* 32.0* 31.9*  PLT 74*  --  77*  --   --  102*   < > = values in this interval not displayed.    Coag's Recent Labs  Lab 04/11/17 2138 04/12/17 2147  04/16/17 1145 04/16/17 1644 04/17/17 0504  APTT 30 41*   < > 65* 58* 68*  INR 1.28 0.99  --   --   --   --    < > = values in this interval not displayed.    Sepsis Markers Recent Labs  Lab 04/12/17 1248 04/12/17 1254 04/13/17 0832 04/13/17 1207  LATICACIDVEN  --  0.9 2.4* 1.6  PROCALCITON 1.16  --   --   --     ABG Recent Labs  Lab 04/15/17 0310 04/16/17 0420 04/17/17 0430  PHART 7.456* 7.419 7.338*  PCO2ART 41.7 45.9 65.1*  PO2ART 66.0* 132* 112*    Liver Enzymes Recent Labs  Lab 04/15/17 0257 04/16/17 0343 04/17/17 0504  AST 47* 43* 33  ALT 25 25 27   ALKPHOS 38 49 57  BILITOT 2.0* 1.4* 1.0  ALBUMIN 2.4* 2.4* 2.4*     Cardiac Enzymes Recent Labs  Lab 04/12/17 0057 04/12/17 0605 04/12/17 1248  TROPONINI 0.11* 0.18* 0.29*    Glucose Recent Labs  Lab 04/16/17 1146 04/16/17 1540 04/16/17 2015 04/16/17 2352 04/17/17 0349 04/17/17 0815  GLUCAP 139* 126* 94 134* 108* 122*    Imaging Dg Chest Port 1 View  Result Date: 04/17/2017 CLINICAL DATA:  Status post repair of a right ventricular laceration, placement of Impella left ventricular device, on 12 April 2017. Status post CABG on 11 April 2017. EXAM: PORTABLE CHEST 1 VIEW COMPARISON:  Portable chest x-ray of April 16, 2017 FINDINGS: The lungs are reasonably well inflated. Small bilateral pleural effusions persist. There is bibasilar subsegmental atelectasis. The cardiac silhouette remains enlarged. The endotracheal tube tip lies 3 point 4 cm above the carina. The esophagogastric tube tip lies in the gastric cardia. The feeding tube tip is not visible on this study. The Swan-Ganz catheter tip projects over the proximal right main pulmonary artery. The right internal jugular venous catheter tip projects over the midportion of the SVC. The Impella left ventricular assist device appears to be in stable position. IMPRESSION: Stable appearance of the chest since yesterday's study. Bibasilar atelectasis with small bilateral pleural effusions. Mild pulmonary vascular congestion and pulmonary edema with stable cardiomegaly. The support devices are in stable position. Electronically Signed   By: David  SwazilandJordan M.D.   On: 04/17/2017 07:27     STUDIES:  CXR 11/13 > possible PNA. Echo TEE 11/16 >     Right ventricle: Prior to chest closure, the RV size and systolic function appeared adequate. Following chest closure there was some reduction RV systolic function, but there was no RV enlargement or septal shift.  Mitral valve: The mitral valve leaflets were of normal thickness. There was good leaflet separation. There was normal leaflet coptation  without prolapsing or flail leaflet segments. There was trace mitral insufficiency. The impella cannula did not interfere with the mitral valve apparatus.  Tricuspid valve: Trace regurgitation. The tricuspid valve regurgitation jet is central.     CULTURES: Blood 11/13 >  Sputum 11/13 >   ANTIBIOTICS: Ceftriaxone 11/13 > 11/17 Azithromycin 11/13 > 11/17 vanc 11/17>>> Zosyn 11/17>>>  SIGNIFICANT EVENTS: 11/13 > admit.  LINES/TUBES: ETT 11/13 >  R femoral sheath 11/13 >  R IJ CVL (bensimohn) 11/14>>>  DISCUSSION: 66 y.o. male admitted 11/13 with inferolateral STEMI.  He was intubated and was then taken to cath lab where he was found to have 100% stenosed mid LAD lesion that was balloon angioplastied with 25% residual stenosis, distal RCA lesion which had DES placed, Ost 1st diag lesion 50% stenosed, proximal ramus 70% stenosed.  IABP was also placed. On f/u echo had ?MV rupture - taken urgently to OR.  Had LV pseudoaneurysm rupture, repaired, impala placed.   ASSESSMENT / PLAN:  PULMONARY A: Acute respiratory failure - r/t STEMI ? PNA - bilateral patchiness - ?aspiration v edema  P:   Vent support - dyssynchrony improved on pressure control - continue for now  F/u CXR  F/u ABG Continue empiric abx as above  Consider SBT after impala removal 11/20   CARDIOVASCULAR A:  Inferolateral STEMI - s/p cath lab where he was found to have 100% stenosed mid LAD lesion that was balloon angioplastied with 25% residual stenosis, distal RCA lesion which had DES placed, Ost 1st diag lesion 50% stenosed, proximal ramus 70% stenosed.  IABP was also placed. LV pseudoaneurysm rupture - to OR emergently 11/14, chest closure 11/16 Hypotension - suspect sedation related (was normotensive on arrival to ICU but was somewhat agitated so got additional sedation and later became hypotensive). Hx HTN, CAD. P:  Cardiology/ CVTS managing. For OR in am for impala removal  Wean levophed as able   Follow CVPs  Diuresing well on lasix gtt  Co-ox 61  Continue amiodarone  Anticoagulation per cards - bivalirudin, cangrelor    RENAL A:   NAGMA. AKI - improving.  Good UOP P:   Gentle diuresis as tol  BMP in AM. Replace electrolytes as needed   GASTROINTESTINAL A:   GI prophylaxis. Nutrition. P:   SUP: Pantoprazole.  HEMATOLOGIC A:   VTE Prophylaxis. P:  Anticoagulation as above  CBC in AM.  INFECTIOUS A:   Possible PNA. P:   Broad spectrum abx as above  Follow cultures - neg to date  Trend wbc, fever curve  Check pct   ENDOCRINE A:   No acute issues. P:   Monitor glucose on chem   NEUROLOGIC A:   Sedation due to mechanical ventilation. P:   Sedation:  Precedex, fentanyl, PRN versed  RASS goal: 0 to -1. Daily WUA. Will increase precedex ceiling, avoid versed as able with hypotension   Family updated: None available 11/19  Interdisciplinary Family Meeting v Palliative Care Meeting:  Due by: 04/17/17.   Dirk DressKaty Whiteheart, NP 04/17/2017  8:58 AM Pager: [phone]) [phone] or [phone]  Attending Note:  66 year old male s/p inferolateral STEMI and cardiogenic shock.  Patient was intubated prior to cath lab for pulmonary edema induced respiratory failure.  PCCM asked to assist with vent.  Patient has an impella in place and is being taken to the OR for removal in AM.  Pressor alteration noted.  Epi off, norepi at 10 with assist device in place.  Continue full vent support.  Continue hemodynamic support.  Wean impella and PCCM will follow.  The patient is critically ill with multiple organ systems failure and requires high complexity decision making for assessment and support, frequent evaluation and titration of therapies, application of advanced monitoring technologies and extensive interpretation of multiple databases.   Critical Care Time devoted to patient care services described in  this note is  35  Minutes. This time reflects time of care of this  signee Dr Koren Bound. This critical care time does not reflect procedure time, or teaching time or supervisory time of PA/NP/Med student/Med Resident etc but could involve care discussion time.  Alyson Reedy, M.D. [facility] Pulmonary/Critical Care Medicine. Pager: [phone]. After hours pager: [phone].

## 2017-04-17 NOTE — Progress Notes (Signed)
CRITICAL VALUE ALERT  Critical Value: pco2 65, bicarb 33  Date & Time Notied:  11/19 0455  Provider Notified: Dr Wyatt PortelaSomner  Orders Received/Actions taken: no new orders

## 2017-04-17 NOTE — Progress Notes (Signed)
eLink Physician-Brief Progress Note Patient Name: Arlana HoveGary Garlow DOB: 1950-07-08 MRN: [id number]   Date of Service  04/17/2017  HPI/Events of Note  ABG on 40$/PC 16/P 5 = 7.34/65/112/33.5  eICU Interventions  The pH is acceptable on these ventilator settings. Continue present management.      Intervention Category Major Interventions: Acid-Base disturbance - evaluation and management;Respiratory failure - evaluation and management  Sommer,Steven Dennard Nipugene 04/17/2017, 4:56 AM

## 2017-04-17 NOTE — Anesthesia Procedure Notes (Deleted)
Performed by: Flossie Wexler P, CRNA       

## 2017-04-17 NOTE — Progress Notes (Signed)
  Paged due to purge alarms on Impella.   Device assessed with Impella rep. Flushed but no change.   Waveforms look ok. Flows stable.   I checked position under echo. Position stable. ? Small clot on tip of Impella.   Patient has been off heparin due to ? HIT.   Getting bival systemically but no AC in purge.  Case d/w Dr. Tyrone SageGerhardt and PharmD.  Will stop bival. Restart heparin systemically and in purge. I turned Impella down to P-4 and tolerated well. .  Will make NPO. Stop cangrelor and plan to go to OR today for Impella removal. Start low-dose norepi for BP support as needed.  Additional 40 mins CCT. Not including imaging and repositioning time.   Arvilla Meresaniel Bensimhon, MD  11:49 AM

## 2017-04-17 NOTE — Progress Notes (Signed)
Nutrition Follow-up  DOCUMENTATION CODES:   Obesity unspecified  INTERVENTION:   Vital High Protein @ 50 ml/hr 30 ml Prostat BID Provides: 1400 kcals, 136 grams protein, 1008 ml free water.   NUTRITION DIAGNOSIS:   Inadequate oral intake related to acute illness, inability to eat as evidenced by NPO status.  Ongoing  GOAL:   Provide needs based on ASPEN/SCCM guidelines  Continue with tube feedings  MONITOR:   Vent status, Labs, Weight trends  REASON FOR ASSESSMENT:   Ventilator    ASSESSMENT:   66 yo male admitted with sunset onset of SOB and chest pain, found to have inferolateral STEMI and taken to cath lab (distal RCA lesion with DES placed, 100% stenosed mid LAD lesion that was balloon angioplastied, IABP placed). Pt intubated prior to cardiac cath. Pt found to have LV aneurysm on 11/14 and taking emergently to OR. Pt with hx of CAD, HTN   Pt currently receiving Vital 1.5 @ 40 ml/hr and tolerating well. This provides 1440 calories and 65 g protein. This meets 100% of calorie needs and 48% of protein needs. Recommend changing Vital 1.5 to Vital HP to meet pt's protein needs per ASPEN guidelines. Discussed with MD.   Weight noted to be decreased by 2 lb since last RD visit.   Patient is currently intubated on ventilator support MV: 10.3 L/min Temp (24hrs), Avg:100 F (37.8 C), Min:99.3 F (37.4 C), Max:100.6 F (38.1 C) Propofol: None BP: 80-67 MAP 73  Medications reviewed and include: fentanyl, SSI, NS @ 10 ml/hr, precedex, lasix, LR @ 20 ml/hr, levophed, IV abx, KCl Labs reviewed: Na 134 (L) K 3.3 (H) calcium ionized 1.08 (L)  Diet Order:  Diet NPO time specified  EDUCATION NEEDS:   Not appropriate for education at this time  Skin:  Skin Assessment: Skin Integrity Issues: Skin Integrity Issues:: Incisions Incisions: chest  Last BM:  04/17/17  Height:   Ht Readings from Last 1 Encounters:  04/13/17 5\' 7"  (1.702 m)    Weight:   Wt Readings  from Last 1 Encounters:  04/17/17 217 lb 13 oz (98.8 kg)    Ideal Body Weight:  67.3 kg  BMI:  Body mass index is 34.11 kg/m.  Estimated Nutritional Needs:   Kcal:  [id number] kcals  Protein:  135-168 g  Fluid:  >/= 1.8 L    Vanessa Kickarly Madelaine Whipple RD, LDN Clinical Nutrition Pager # [phone]

## 2017-04-17 NOTE — Progress Notes (Signed)
Advanced Heart Failure Rounding Note   Subjective:    - S/P LV pseudoaneurysm repeat 11/14 Impella 5.0 Placed.  - Chest closed 11/16.  - Started on broad spectrum abx on 11/17 for fever and leukocytosis.   Coox 61.1% this am on epi 1 mcg. Now weaned off both epi and levophed. Lasix gtt resumed at 10 mg/hr 11/18.  Down 8 lbs with 5.6 L of UO yesterday (Only negative 200 cc with drips)  Awake on vent. Nods to questioning.  LDH 361 -> 281 on Bival.  Creatinine stable. Hgb 10.0. PLTs improved at 102k.  On bridge dose cangrelor. P2Y12 is inhibited at 100.   CXR with mild pulmonary vascular congestion and pulmonary edema. (Stable from 04/16/17)   Swan #s as of 0650 CVP 12 PAP 49/25 (32) CO 4.32 CI 2.06  Impella at P-6. Flow 3.5 L. Position assessed and adjusted under echo guidance 11/18 to 3.2 cm.   Objective:   Weight Range: 217 lb 13 oz (98.8 kg) Body mass index is 34.11 kg/m.   Vital Signs:   Temp:  [99.3 F (37.4 C)-100.4 F (38 C)] 100.2 F (37.9 C) (11/19 0400) Pulse Rate:  [63-75] 63 (11/19 0400) Resp:  [0-25] 12 (11/19 0400) BP: (87-162)/(58-98) 92/65 (11/19 0400) SpO2:  [94 %-100 %] 95 % (11/19 0400) Arterial Line BP: (81-138)/(51-76) 90/54 (11/19 0400) FiO2 (%):  [50 %] 50 % (11/19 0400) Weight:  [217 lb 13 oz (98.8 kg)] 217 lb 13 oz (98.8 kg) (11/19 0400) Last BM Date: 04/17/17  Weight change: Filed Weights   04/15/17 0411 04/16/17 0700 04/17/17 0400  Weight: 214 lb 4.6 oz (97.2 kg) 225 lb 5 oz (102.2 kg) 217 lb 13 oz (98.8 kg)    Intake/Output:   Intake/Output Summary (Last 24 hours) at 04/17/2017 0738 Last data filed at 04/17/2017 0700 Gross per 24 hour  Intake 6573.68 ml  Output 6775 ml  Net -201.32 ml      Physical Exam   CVP 12 (Personal check)  General: Intubated/sedated.  HEENT: + ETT Neck: Supple. JVP 11-12 cm. Carotids 2+ bilat; no bruits. No thyromegaly or nodule noted. Cor: RRR. + impella hum. + sternal dressing with CT  and pacing wires C/D/I.  Lungs: Diminished basilar sounds + mechanical breathing sounds.  Abdomen: Soft, non-tender, non-distended, no HSM. No bruits or masses. +BS  Extremities: No cyanosis, clubbing, or rash. 1+ BLE edema.  Neuro: Intubated/sedated  Telemetry   NSR 70s, Personally reviewed.   EKG   N/A  Labs    CBC Recent Labs    04/16/17 0343  04/16/17 2321 04/17/17 0504  WBC 15.8*  --   --  11.9*  HGB 10.5*   < > 10.9* 10.0*  HCT 32.7*   < > 32.0* 31.9*  MCV 87.9  --   --  90.4  PLT 77*  --   --  102*   < > = values in this interval not displayed.   Basic Metabolic Panel Recent Labs    04/16/17 0343  04/16/17 2321 04/17/17 0504  NA 133*   < > 135 134*  K 3.6   < > 3.5 3.3*  CL 99*   < > 91* 95*  CO2 27  --   --  32  GLUCOSE 114*   < > 148* 130*  BUN 14   < > 13 12  CREATININE 0.98   < > 1.00 0.99  CALCIUM 7.8*  --   --  7.7*   < > =  values in this interval not displayed.   Liver Function Tests Recent Labs    04/16/17 0343 04/17/17 0504  AST 43* 33  ALT 25 27  ALKPHOS 49 57  BILITOT 1.4* 1.0  PROT 5.1* 5.1*  ALBUMIN 2.4* 2.4*   No results for input(s): LIPASE, AMYLASE in the last 72 hours. Cardiac Enzymes No results for input(s): CKTOTAL, CKMB, CKMBINDEX, TROPONINI in the last 72 hours.  BNP: BNP (last 3 results) No results for input(s): BNP in the last 8760 hours.  ProBNP (last 3 results) No results for input(s): PROBNP in the last 8760 hours.   D-Dimer No results for input(s): DDIMER in the last 72 hours. Hemoglobin A1C No results for input(s): HGBA1C in the last 72 hours. Fasting Lipid Panel No results for input(s): CHOL, HDL, LDLCALC, TRIG, CHOLHDL, LDLDIRECT in the last 72 hours. Thyroid Function Tests No results for input(s): TSH, T4TOTAL, T3FREE, THYROIDAB in the last 72 hours.  Invalid input(s): FREET3  Other results:   Imaging    Dg Chest Port 1 View  Result Date: 04/17/2017 CLINICAL DATA:  Status post repair of a  right ventricular laceration, placement of Impella left ventricular device, on 12 April 2017. Status post CABG on 11 April 2017. EXAM: PORTABLE CHEST 1 VIEW COMPARISON:  Portable chest x-ray of April 16, 2017 FINDINGS: The lungs are reasonably well inflated. Small bilateral pleural effusions persist. There is bibasilar subsegmental atelectasis. The cardiac silhouette remains enlarged. The endotracheal tube tip lies 3 point 4 cm above the carina. The esophagogastric tube tip lies in the gastric cardia. The feeding tube tip is not visible on this study. The Swan-Ganz catheter tip projects over the proximal right main pulmonary artery. The right internal jugular venous catheter tip projects over the midportion of the SVC. The Impella left ventricular assist device appears to be in stable position. IMPRESSION: Stable appearance of the chest since yesterday's study. Bibasilar atelectasis with small bilateral pleural effusions. Mild pulmonary vascular congestion and pulmonary edema with stable cardiomegaly. The support devices are in stable position. Electronically Signed   By: David  Martinique M.D.   On: 04/17/2017 07:27     Medications:     Scheduled Medications: . acetaminophen  1,000 mg Oral Q6H   Or  . acetaminophen (TYLENOL) oral liquid 160 mg/5 mL  1,000 mg Per Tube Q6H  . aspirin  81 mg Oral Daily  . bisacodyl  10 mg Oral Daily   Or  . bisacodyl  10 mg Rectal Daily  . chlorhexidine gluconate (MEDLINE KIT)  15 mL Mouth Rinse BID  . Chlorhexidine Gluconate Cloth  6 each Topical Daily  . fentaNYL (SUBLIMAZE) injection  50 mcg Intravenous Once  . insulin aspart  0-24 Units Subcutaneous Q4H  . mouth rinse  15 mL Mouth Rinse 10 times per day  . pantoprazole sodium  40 mg Per Tube Daily  . sodium chloride flush  10-40 mL Intracatheter Q12H  . sodium chloride flush  3 mL Intravenous Q12H    Infusions: . sodium chloride 20 mL/hr at 04/17/17 0316  . sodium chloride    . sodium chloride  Stopped (04/14/17 1015)  . sodium chloride    . amiodarone 30 mg/hr (04/17/17 0354)  . bivalirudin (ANGIOMAX) infusion 0.5 mg/mL (Non-ACS indications) 0.05 mg/kg/hr (04/16/17 1851)  . cangrelor 50 mg in NS 250 mL 0.75 mcg/kg/min (04/16/17 1955)  . dexmedetomidine (PRECEDEX) IV infusion 0.7 mcg/kg/hr (04/17/17 0509)  . dextrose 5 % Impella 5.0 Purge solution 1,000 mL (04/16/17 1256)  .  epinephrine Stopped (04/17/17 0615)  . feeding supplement (VITAL 1.5 CAL) 1,000 mL (04/16/17 1752)  . fentaNYL infusion INTRAVENOUS 300 mcg/hr (04/17/17 0559)  . furosemide (LASIX) infusion 10 mg/hr (04/17/17 0709)  . lactated ringers    . lactated ringers 10 mL (04/16/17 2355)  . lactated ringers 10 mL/hr at 04/17/17 0110  . nitroGLYCERIN Stopped (04/12/17 2200)  . norepinephrine (LEVOPHED) Adult infusion Stopped (04/17/17 2248)  . piperacillin-tazobactam (ZOSYN)  IV 3.375 g (04/17/17 0600)  . potassium chloride 10 mEq (04/17/17 2500)  . vancomycin Stopped (04/16/17 2240)    PRN Medications: sodium chloride, albuterol, fentaNYL, lactated ringers, midazolam, ondansetron (ZOFRAN) IV, potassium chloride, sodium chloride flush, sodium chloride flush    Patient Profile    Mr Martine is 66 year old with h/o MI, HTN, COPD admitted with chest pain. EKG with evidence of inferior/lateral STEMI. Due to lethargy and hypoxemia he was intubated in the ED.  Taken to cath lab with RCA DES and POBA to chronically occluded LAD with IABP placed.   Had LV pseudo aneurysm repair 11/14 .     Assessment/Plan   1. CAD with OOH anterior MI c/b LV pseudoaneurysm and cardiogenic shock - attempted PCI of LAD failed due to chronic occlusion and extensive infarct - s/p PCI/DES to distal RCA on bridge dose cangrelor now. P2Y12 inhibited - No change.   2.  S/P LV pseudoaneurysm repair 11/14 - Chest closed 04/13/17. Stable.  3. Acute systolic HF-> cardiogenic shock - Remaons on Impella support. EF ~25% - Impella  repositioned by Dr. Prescott Gum earlier am of 11/18. Waveforms look good. Dr. Haroldine Laws  confirmed and adjusted position slightly under echo guidance 11/18. Flow 3.5L on P-6.  - RV moderately HK on echo - Swan numbers as above.  - CXR still showing volume overload. Lasix gtt cut back to 5 mg/hr by Dr. Servando Snare.  Can increase as needed.  - Pressures stable this am OFF pressors.  If needed at back levophed. Will watch pressures off pressors prior to adding HF medications.  - D/w Dr. Prescott Gum. Plan removal of Impella on 04/18/17 - LDH improved.  4. Acute Hypoxic Respiratory Failure  - Intubated 11/13  - Volume status improved with resumption of IV lasix.  - CXR stable from 11/18, which showed worsening edema.  Personally reviewed. - Keep intubated until Impella removal 11/20 (tenative).   5. AKI  - Due to ATN shock  - Resolved.  6. COPD - Stable. No wheeze on exam.   7. Thrombocytopenia - Improved 102k. HIT panel borderline. Stable on Bival.   8. Hypokalemia - K 3.3 this am. Will supp.   9. Fever - Day 3 vanc/zosyn - Cultures NGTD  Length of Stay: [place], Hershal Coria  04/17/2017, 7:38 AM  Advanced Heart Failure Team Pager [phone] (M-F; 7a - 4p)  Please contact [facility] for night-coverage after hours (4p -7a ) and weekends on [email or web address]  Patient seen and examined with the above-signed Advanced Practice Provider and/or Housestaff. I personally reviewed laboratory data, imaging studies and relevant notes. I independently examined the patient and formulated the important aspects of the plan. I have edited the note to reflect any of my changes or salient points. I have personally discussed the plan with the patient and/or family.  Remains intubated and sedated. Has periods of agitation requiring deeper sedation to avoid vent stacking.   Diureses aggressively yesterday. CVP down to 12. Swan numbers improved with CI 2.2. Impella at P-6. 3.5L of  flow.   Fevers  improved. Continue abx. Bcx NGTD, PLTs improved.   Exam Intubated. Sedated R subclav Impella site. LIJ swan Cor RRR impella hum Lungs mechanical breath sounds Ab mildly distended Ext mild edema  Remains tenuous but improving. Wean impella slowly today with plan for extraction tomorrow in OR. Lasix turned down to 5/hr. Will watch throughout the day may need to go back up as CVP still high. Continue bridge dose cangreloe. Support with pressors as needed.   CRITICAL CARE Performed by: Glori Bickers  Total critical care time: 35 minutes  Critical care time was exclusive of separately billable procedures and treating other patients.  Critical care was necessary to treat or prevent imminent or life-threatening deterioration.  Critical care was time spent personally by me (independent of midlevel providers or residents) on the following activities: development of treatment plan with patient and/or surrogate as well as nursing, discussions with consultants, evaluation of patient's response to treatment, examination of patient, obtaining history from patient or surrogate, ordering and performing treatments and interventions, ordering and review of laboratory studies, ordering and review of radiographic studies, pulse oximetry and re-evaluation of patient's condition.  Glori Bickers, MD  8:46 AM

## 2017-04-17 NOTE — Progress Notes (Signed)
Patient ID: [name], male   DOB: Feb 22, 1951, 66 y.o.   MRN: [id number] [facility] DAILY ICU PROGRESS NOTE                   [address]            [place] [id number]          [phone]   3 Days Post-Op Procedure(s) (LRB): STERNAL CLOSURE (N/A) TRANSESOPHAGEAL ECHOCARDIOGRAM (TEE) (N/A) APPLICATION OF WOUND VAC with reposition of Impella device (N/A)  Total Length of Stay:  LOS: 6 days   Subjective: Patient remains on ventilator, but does open his eyes and follows commands  Objective: Vital signs in last 24 hours: Temp:  [99.3 F (37.4 C)-100.6 F (38.1 C)] 100.6 F (38.1 C) (11/19 0655) Pulse Rate:  [63-76] 76 (11/19 0655) Cardiac Rhythm: Normal sinus rhythm (11/19 0400) Resp:  [0-25] 20 (11/19 0655) BP: (87-162)/(58-98) 92/65 (11/19 0400) SpO2:  [94 %-100 %] 99 % (11/19 0655) Arterial Line BP: (81-138)/(51-76) 123/62 (11/19 0655) FiO2 (%):  [50 %] 50 % (11/19 0400) Weight:  [217 lb 13 oz (98.8 kg)] 217 lb 13 oz (98.8 kg) (11/19 0400)  Filed Weights   04/15/17 0411 04/16/17 0700 04/17/17 0400  Weight: 214 lb 4.6 oz (97.2 kg) 225 lb 5 oz (102.2 kg) 217 lb 13 oz (98.8 kg)    Weight change: -7.9 oz (-3.4 kg)   Hemodynamic parameters for last 24 hours: PAP: (31-54)/(13-25) 41/17 CVP:  [8 mmHg-18 mmHg] 13 mmHg PCWP:  [20 mmHg] 20 mmHg CO:  [4 L/min-5.6 L/min] 4.3 L/min CI:  [1.9 L/min/m2-2.7 L/min/m2] 2.1 L/min/m2  Intake/Output from previous day: 11/18 0701 - 11/19 0700 In: 6573.7 [I.V.:3760.2; NG/GT:1431.3; IV Piggyback:1000] Out: 2130 [Urine:5635; Emesis/NG output:1050; Chest Tube:90]  Intake/Output this shift: No intake/output data recorded.  Current Meds: Scheduled Meds: . acetaminophen  1,000 mg Oral Q6H   Or  . acetaminophen (TYLENOL) oral liquid 160 mg/5 mL  1,000 mg Per Tube Q6H  . aspirin  81 mg Oral Daily  . bisacodyl  10 mg Oral Daily   Or  . bisacodyl  10 mg Rectal Daily  . chlorhexidine gluconate (MEDLINE KIT)  15 mL Mouth  Rinse BID  . Chlorhexidine Gluconate Cloth  6 each Topical Daily  . fentaNYL (SUBLIMAZE) injection  50 mcg Intravenous Once  . insulin aspart  0-24 Units Subcutaneous Q4H  . mouth rinse  15 mL Mouth Rinse 10 times per day  . pantoprazole sodium  40 mg Per Tube Daily  . sodium chloride flush  10-40 mL Intracatheter Q12H  . sodium chloride flush  3 mL Intravenous Q12H   Continuous Infusions: . sodium chloride 20 mL/hr at 04/17/17 0316  . sodium chloride    . sodium chloride Stopped (04/14/17 1015)  . sodium chloride    . amiodarone 30 mg/hr (04/17/17 0354)  . bivalirudin (ANGIOMAX) infusion 0.5 mg/mL (Non-ACS indications) 0.05 mg/kg/hr (04/16/17 1851)  . cangrelor 50 mg in NS 250 mL 0.75 mcg/kg/min (04/16/17 1955)  . dexmedetomidine (PRECEDEX) IV infusion 0.7 mcg/kg/hr (04/17/17 0509)  . dextrose 5 % Impella 5.0 Purge solution 1,000 mL (04/16/17 1256)  . epinephrine Stopped (04/17/17 0615)  . feeding supplement (VITAL 1.5 CAL) 1,000 mL (04/16/17 1752)  . fentaNYL infusion INTRAVENOUS 300 mcg/hr (04/17/17 0559)  . furosemide (LASIX) infusion 10 mg/hr (04/17/17 0709)  . lactated ringers    . lactated ringers 10 mL (04/16/17 2355)  . lactated ringers 10 mL/hr at 04/17/17 0110  .  nitroGLYCERIN Stopped (04/12/17 2200)  . norepinephrine (LEVOPHED) Adult infusion Stopped (04/17/17 5465)  . piperacillin-tazobactam (ZOSYN)  IV 3.375 g (04/17/17 0600)  . potassium chloride 10 mEq (04/17/17 0354)  . vancomycin Stopped (04/16/17 2240)   PRN Meds:.sodium chloride, albuterol, fentaNYL, lactated ringers, midazolam, ondansetron (ZOFRAN) IV, potassium chloride, sodium chloride flush, sodium chloride flush  General appearance: cooperative and no distress Neurologic: intact Heart: regular rate and rhythm, S1, S2 normal, no murmur, click, rub or gallop Lungs: diminished breath sounds bibasilar Abdomen: soft, non-tender; bowel sounds normal; no masses,  no organomegaly Extremities: extremities  normal, atraumatic, no cyanosis or edema and Homans sign is negative, no sign of DVT Wound: Sternum intact wound VAC removed  Lab Results: CBC: Recent Labs    04/16/17 0343  04/16/17 2321 04/17/17 0504  WBC 15.8*  --   --  11.9*  HGB 10.5*   < > 10.9* 10.0*  HCT 32.7*   < > 32.0* 31.9*  PLT 77*  --   --  102*   < > = values in this interval not displayed.   BMET:  Recent Labs    04/16/17 0343  04/16/17 2321 04/17/17 0504  NA 133*   < > 135 134*  K 3.6   < > 3.5 3.3*  CL 99*   < > 91* 95*  CO2 27  --   --  32  GLUCOSE 114*   < > 148* 130*  BUN 14   < > 13 12  CREATININE 0.98   < > 1.00 0.99  CALCIUM 7.8*  --   --  7.7*   < > = values in this interval not displayed.    CMET: Lab Results  Component Value Date   WBC 11.9 (H) 04/17/2017   HGB 10.0 (L) 04/17/2017   HCT 31.9 (L) 04/17/2017   PLT 102 (L) 04/17/2017   GLUCOSE 130 (H) 04/17/2017   CHOL 167 04/11/2017   TRIG 109 04/11/2017   HDL 27 (L) 04/11/2017   LDLCALC 118 (H) 04/11/2017   ALT 27 04/17/2017   AST 33 04/17/2017   NA 134 (L) 04/17/2017   K 3.3 (L) 04/17/2017   CL 95 (L) 04/17/2017   CREATININE 0.99 04/17/2017   BUN 12 04/17/2017   CO2 32 04/17/2017   INR 0.99 04/12/2017      PT/INR: No results for input(s): LABPROT, INR in the last 72 hours. Radiology: Dg Chest Port 1 View  Result Date: 04/17/2017 CLINICAL DATA:  Status post repair of a right ventricular laceration, placement of Impella left ventricular device, on 12 April 2017. Status post CABG on 11 April 2017. EXAM: PORTABLE CHEST 1 VIEW COMPARISON:  Portable chest x-ray of April 16, 2017 FINDINGS: The lungs are reasonably well inflated. Small bilateral pleural effusions persist. There is bibasilar subsegmental atelectasis. The cardiac silhouette remains enlarged. The endotracheal tube tip lies 3 point 4 cm above the carina. The esophagogastric tube tip lies in the gastric cardia. The feeding tube tip is not visible on this study. The  Swan-Ganz catheter tip projects over the proximal right main pulmonary artery. The right internal jugular venous catheter tip projects over the midportion of the SVC. The Impella left ventricular assist device appears to be in stable position. IMPRESSION: Stable appearance of the chest since yesterday's study. Bibasilar atelectasis with small bilateral pleural effusions. Mild pulmonary vascular congestion and pulmonary edema with stable cardiomegaly. The support devices are in stable position. Electronically Signed   By: David  Martinique M.D.  On: 04/17/2017 07:27     Assessment/Plan: S/P Procedure(s) (LRB): STERNAL CLOSURE (N/A) TRANSESOPHAGEAL ECHOCARDIOGRAM (TEE) (N/A) APPLICATION OF WOUND VAC with reposition of Impella device (N/A) Coordinate with heart failure team weaning Impala 5.0 and plan removal in the OR tomorrow Decrease Lasix drip to 5 mg/h, 3 L out last night Since repositioning no further flow problems with pump, currently P7 with flows of 3.6-3.7 Remains on bivalirudin and Cangrelor Leave mediastinal tube for now until after surgery tomorrow   Grace Isaac 04/17/2017 7:49 AM

## 2017-04-17 NOTE — Progress Notes (Signed)
[facility] BRIEF SICU PROGRESS NOTE  Day of Surgery  S/P Procedure(s) (LRB): REMOVAL OF IMPELLA LEFT VENTRICULAR ASSIST DEVICE (N/A)   Stable after removal of Impella AV paced w/ stable hemodynamics on low dose levophed O2 sats 98-100% UOP adequate  Plan: Continue current plan  Purcell Nailslarence H Marlet Korte, MD 04/17/2017 7:55 PM

## 2017-04-17 NOTE — Brief Op Note (Signed)
04/11/2017 - 04/17/2017  1:23 PM  PATIENT:  Ronald HoveGary [name]  66 y.o. male  PRE-OPERATIVE DIAGNOSIS:  Clotted Impella LD  POST-OPERATIVE DIAGNOSIS:  Clotted Impella LD  PROCEDURE: REMOVAL OF IMPELLA LEFT VENTRICULAR ASSIST DEVICE and STAPELING of GRAFT and OVERSEW of skin  SURGEON:  Surgeon(s) and Role:    * Delight OvensGerhardt, Edward B, MD - Primary  PHYSICIAN ASSISTANT: Doree Fudgeonielle Jalon Squier PA-C  ANESTHESIA:   general  EBL:  Minimal  COUNTS CORRECT:  YES  DICTATION: .Dragon Dictation  PLAN OF CARE: Admit to inpatient   PATIENT DISPOSITION:  ICU - intubated and hemodynamically stable.   Delay start of Pharmacological VTE agent (>24hrs) due to surgical blood loss or risk of bleeding: yes

## 2017-04-17 NOTE — Op Note (Signed)
[name]:  Grulke, Lynell                ACCOUNT NO.:  [id number]  MEDICAL RECORD NO.:  [id number]  LOCATION:  2H21C                        FACILITY:  [facility]  PHYSICIAN:  Sheliah PlaneEdward Nikos Anglemyer, MD    DATE OF BIRTH:  09-09-1950  DATE OF PROCEDURE:  04/14/2017 DATE OF DISCHARGE:                              OPERATIVE REPORT   PREOPERATIVE DIAGNOSIS:  Status post resection of left ventricular aneurysm in place of 5.0 Impella LV support device with open sternum.  POSTOPERATIVE DIAGNOSIS:  Status post resection of left ventricular aneurysm in place of 5.0 Impella LV support device with open sternum.  SURGICAL PROCEDURE:  Closure of sternum and wound and repositioning of Impella device under ultrasound guidance.  SURGEON:  Sheliah PlaneEdward Javyon Fontan, MD.  FIRST ASSISTANT:  Doree Fudgeonielle Zimmerman, PA.  BRIEF HISTORY:  Forty eight hours prior, the patient had presented to Cardiology in respiratory distress.  Cardiac catheterization was performed including a stent of the right coronary artery and also attempted opening of the LAD, which was unsuccessful.  Subsequent echocardiogram revealed the patient's ejection fraction at 20% with a large LV aneurysm, question of impending rupture.  He was taken urgently to the OR where the aneurysm was resected and Impella 5.0 LV support device was placed.  At the completion of the procedure, we were unable to close the patient's sternum.  The sternum wound was left open and closed with an Esmarch and wound VAC.  The patient now diuresed and with LV support had stabilized to the point of returning to the OR for closure of the sternum.  His need for pressors had decreased.  Urine output was good.  The patient's sister signed informed consent for him. He remains on the ventilator.  DESCRIPTION OF PROCEDURE:  The patient was brought to the operating room, intubated.  The previously placed chest tubes were removed. Esmarch was removed.  The chest was prepped with Betadine and  draped in a sterile manner including the entrance site through the skin for the Impella 5.0.  Dr. Noreene LarssonJoslin placed a TEE which showed the right ventricle functioning well.  Left ventricular ejection fraction was still depressed in the 20-25% range.  The Impella insertion distance was slightly low.  Appropriate time-out was performed and then we proceeded to close the sternum.  A single Blake mediastinal drain was left in place.  #6 stainless steel wires were used to reapproximate the sternum, taking care not to kink or disturb the Impella and the associated 10-mm graft, which had passed through into the aorta.  The fascia was then closed with interrupted 0 Vicryl.  The wound was copiously irrigated. Running 2-0 subcutaneous tissue was placed and a 3-0 subcuticular stitch.  An incisional wound VAC was placed on the incision.  We then under ultrasound guidance with the TEE repositioned the Impella slightly, so its insertion distance was 3.2 cm.  On P7, the patient's flows were 3.6 to 3.8.  He remained hemodynamically stable. We then transported the patient on the ventilator back to the Surgical Intensive Care Unit for postoperative care.  Blood loss was minimal. Sponge and needle count was reported as correct at the completion of the procedure.  The patient tolerated the  procedure without obvious complication.     Sheliah PlaneEdward Hesston Hitchens, MD     EG/MEDQ  D:  04/17/2017  T:  04/17/2017  Job:  [id number]

## 2017-04-17 NOTE — Anesthesia Preprocedure Evaluation (Addendum)
Anesthesia Evaluation  Patient identified by MRN, date of birth, ID band Patient unresponsive    Reviewed: NPO status , Patient's Chart, lab work & pertinent test results, Unable to perform ROS - Chart review only  Airway Mallampati: Intubated       Dental  (+) Teeth Intact   Pulmonary COPD, Current Smoker,     + decreased breath sounds      Cardiovascular hypertension, + CAD and + Past MI   Rhythm:Regular Rate:Normal     Neuro/Psych    GI/Hepatic   Endo/Other    Renal/GU      Musculoskeletal   Abdominal   Peds  Hematology   Anesthesia Other Findings   Reproductive/Obstetrics                            Intraop Echo: There was severe LV dysfunction. The basal regions appeared to contract adequately but the mid-distal anterior wall, septal, inferior and lateral regions were akinetic. No thrombus could be visualized within the LV cavity.  Anesthesia Physical Anesthesia Plan  ASA: IV  Anesthesia Plan: General   Post-op Pain Management:    Induction: Inhalational  PONV Risk Score and Plan:   Airway Management Planned: Oral ETT  Additional Equipment: Arterial line, CVP, PA Cath and TEE  Intra-op Plan:   Post-operative Plan: Post-operative intubation/ventilation  Informed Consent: I have reviewed the patients History and Physical, chart, labs and discussed the procedure including the risks, benefits and alternatives for the proposed anesthesia with the patient or authorized representative who has indicated his/her understanding and acceptance.     Plan Discussed with:   Anesthesia Plan Comments: (Currently on Amiodarone, Precedex, Levophed. )       Anesthesia Quick Evaluation

## 2017-04-17 NOTE — Anesthesia Postprocedure Evaluation (Signed)
Anesthesia Post Note  Patient: [name]  Procedure(s) Performed: REMOVAL OF IMPELLA LEFT VENTRICULAR ASSIST DEVICE (N/A )     Patient location during evaluation: SICU Anesthesia Type: General Level of consciousness: sedated Pain management: pain level controlled Vital Signs Assessment: post-procedure vital signs reviewed and stable Respiratory status: patient remains intubated per anesthesia plan Cardiovascular status: stable Postop Assessment: no apparent nausea or vomiting Anesthetic complications: no    Last Vitals:  Vitals:   04/17/17 1215 04/17/17 1433  BP: 103/74 99/75  Pulse: 80 80  Resp: 16 19  Temp:  (!) 38 C  SpO2: 98% 99%    Last Pain:  Vitals:   04/16/17 2139  TempSrc:   PainSc: 0-No pain                 Shelton SilvasKevin D Joesph Marcy

## 2017-04-17 NOTE — Transfer of Care (Signed)
Immediate Anesthesia Transfer of Care Note  Patient: Ronald HoveGary [name]  Procedure(s) Performed: REMOVAL OF IMPELLA LEFT VENTRICULAR ASSIST DEVICE (N/A )  Patient Location: SICU  Anesthesia Type:General  Level of Consciousness: sedated and Patient remains intubated per anesthesia plan  Airway & Oxygen Therapy: Patient remains intubated per anesthesia plan and Patient placed on Ventilator (see vital sign flow sheet for setting)  Post-op Assessment: Report given to RN and Post -op Vital signs reviewed and stable  Post vital signs: Reviewed and stable  Last Vitals:  Vitals:   04/17/17 1200 04/17/17 1215  BP:  103/74  Pulse: 62 80  Resp: (!) 7 16  Temp:    SpO2: 97% 98%    Last Pain:  Vitals:   04/16/17 2139  TempSrc:   PainSc: 0-No pain         Complications: No apparent anesthesia complications

## 2017-04-18 ENCOUNTER — Encounter (HOSPITAL_COMMUNITY): Payer: Self-pay | Admitting: Cardiothoracic Surgery

## 2017-04-18 ENCOUNTER — Inpatient Hospital Stay (HOSPITAL_COMMUNITY): Payer: Medicare Other

## 2017-04-18 ENCOUNTER — Other Ambulatory Visit: Payer: Self-pay

## 2017-04-18 DIAGNOSIS — G9340 Encephalopathy, unspecified: Secondary | ICD-10-CM

## 2017-04-18 LAB — GLUCOSE, CAPILLARY
GLUCOSE-CAPILLARY: 125 mg/dL — AB (ref 65–99)
GLUCOSE-CAPILLARY: 144 mg/dL — AB (ref 65–99)
GLUCOSE-CAPILLARY: 80 mg/dL (ref 65–99)
Glucose-Capillary: 121 mg/dL — ABNORMAL HIGH (ref 65–99)
Glucose-Capillary: 127 mg/dL — ABNORMAL HIGH (ref 65–99)
Glucose-Capillary: 133 mg/dL — ABNORMAL HIGH (ref 65–99)
Glucose-Capillary: 140 mg/dL — ABNORMAL HIGH (ref 65–99)

## 2017-04-18 LAB — POCT I-STAT 3, ART BLOOD GAS (G3+)
Acid-Base Excess: 11 mmol/L — ABNORMAL HIGH (ref 0.0–2.0)
Acid-Base Excess: 11 mmol/L — ABNORMAL HIGH (ref 0.0–2.0)
Acid-Base Excess: 9 mmol/L — ABNORMAL HIGH (ref 0.0–2.0)
Acid-Base Excess: 19 mmol/L — ABNORMAL HIGH (ref 0.0–2.0)
BICARBONATE: 37.7 mmol/L — AB (ref 20.0–28.0)
BICARBONATE: 46.9 mmol/L — AB (ref 20.0–28.0)
Bicarbonate: 37.4 mmol/L — ABNORMAL HIGH (ref 20.0–28.0)
Bicarbonate: 39 mmol/L — ABNORMAL HIGH (ref 20.0–28.0)
O2 SAT: 97 %
O2 SAT: 95 %
O2 Saturation: 97 %
O2 Saturation: 97 %
PCO2 ART: 63.9 mmHg — AB (ref 32.0–48.0)
PCO2 ART: 62.9 mmHg — AB (ref 32.0–48.0)
PH ART: 7.383 (ref 7.350–7.450)
PH ART: 7.387 (ref 7.350–7.450)
PO2 ART: 98 mmHg (ref 83.0–108.0)
PO2 ART: 98 mmHg (ref 83.0–108.0)
PO2 ART: 105 mmHg (ref 83.0–108.0)
Patient temperature: 38
Patient temperature: 38.1
Patient temperature: 38
Patient temperature: 37.9
TCO2: 40 mmol/L — ABNORMAL HIGH (ref 22–32)
TCO2: 39 mmol/L — ABNORMAL HIGH (ref 22–32)
TCO2: 42 mmol/L — AB (ref 22–32)
TCO2: 49 mmol/L — ABNORMAL HIGH (ref 22–32)
pCO2 arterial: 75.1 mmHg (ref 32.0–48.0)
pH, Arterial: 7.243 — ABNORMAL LOW (ref 7.350–7.450)
pH, Arterial: 7.407 (ref 7.350–7.450)
pO2, Arterial: 102 mmHg (ref 83.0–108.0)

## 2017-04-18 LAB — BLOOD GAS, ARTERIAL
Acid-Base Excess: 7 mmol/L — ABNORMAL HIGH (ref 0.0–2.0)
Bicarbonate: 33.4 mmol/L — ABNORMAL HIGH (ref 20.0–28.0)
Drawn by: 41422
FIO2: 40
O2 Saturation: 95.5 %
PEEP: 5 cmH2O
Patient temperature: 100.4
Pressure control: 26 cmH2O
RATE: 16 {breaths}/min
pCO2 arterial: 76.4 mmHg (ref 32.0–48.0)
pH, Arterial: 7.27 — ABNORMAL LOW (ref 7.350–7.450)
pO2, Arterial: 93.8 mmHg (ref 83.0–108.0)

## 2017-04-18 LAB — AEROBIC/ANAEROBIC CULTURE W GRAM STAIN (SURGICAL/DEEP WOUND)
Culture: NO GROWTH
Culture: NO GROWTH

## 2017-04-18 LAB — CBC
HEMATOCRIT: 32.5 % — AB (ref 39.0–52.0)
Hemoglobin: 9.7 g/dL — ABNORMAL LOW (ref 13.0–17.0)
MCH: 27.3 pg (ref 26.0–34.0)
MCHC: 29.8 g/dL — AB (ref 30.0–36.0)
MCV: 91.5 fL (ref 78.0–100.0)
Platelets: 144 10*3/uL — ABNORMAL LOW (ref 150–400)
RBC: 3.55 MIL/uL — ABNORMAL LOW (ref 4.22–5.81)
RDW: 14.6 % (ref 11.5–15.5)
WBC: 13.8 10*3/uL — ABNORMAL HIGH (ref 4.0–10.5)

## 2017-04-18 LAB — BASIC METABOLIC PANEL WITH GFR
Anion gap: 8 (ref 5–15)
BUN: 18 mg/dL (ref 6–20)
CO2: 32 mmol/L (ref 22–32)
Calcium: 7.6 mg/dL — ABNORMAL LOW (ref 8.9–10.3)
Chloride: 94 mmol/L — ABNORMAL LOW (ref 101–111)
Creatinine, Ser: 1.03 mg/dL (ref 0.61–1.24)
GFR calc Af Amer: >60 mL/min
GFR calc non Af Amer: >60 mL/min
Glucose, Bld: 146 mg/dL — ABNORMAL HIGH (ref 65–99)
Potassium: 3.8 mmol/L (ref 3.5–5.1)
Sodium: 134 mmol/L — ABNORMAL LOW (ref 135–145)

## 2017-04-18 LAB — COOXEMETRY PANEL
CARBOXYHEMOGLOBIN: 1.7 % — AB (ref 0.5–1.5)
CARBOXYHEMOGLOBIN: 1.2 % (ref 0.5–1.5)
METHEMOGLOBIN: 0.8 % (ref 0.0–1.5)
METHEMOGLOBIN: 1.7 % — AB (ref 0.0–1.5)
O2 SAT: 85.5 %
O2 Saturation: 56.3 %
TOTAL HEMOGLOBIN: 9.6 g/dL — AB (ref 12.0–16.0)
Total hemoglobin: 9.9 g/dL — ABNORMAL LOW (ref 12.0–16.0)

## 2017-04-18 LAB — AEROBIC CULTURE W GRAM STAIN (SUPERFICIAL SPECIMEN): Special Requests: NORMAL

## 2017-04-18 LAB — PROTIME-INR
INR: 1.27
Prothrombin Time: 15.8 seconds — ABNORMAL HIGH (ref 11.4–15.2)

## 2017-04-18 LAB — VANCOMYCIN, TROUGH: Vancomycin Tr: 20 ug/mL (ref 15–20)

## 2017-04-18 LAB — APTT: aPTT: 37 s — ABNORMAL HIGH (ref 24–36)

## 2017-04-18 LAB — LACTATE DEHYDROGENASE: LDH: 282 U/L — ABNORMAL HIGH (ref 98–192)

## 2017-04-18 MED ORDER — TICAGRELOR 90 MG PO TABS
180.0000 mg | ORAL_TABLET | Freq: Once | ORAL | Status: AC
  Administered 2017-04-18: 180 mg
  Filled 2017-04-18: qty 2

## 2017-04-18 MED ORDER — POTASSIUM CHLORIDE 10 MEQ/50ML IV SOLN
10.0000 meq | INTRAVENOUS | Status: AC | PRN
  Administered 2017-04-18 – 2017-04-19 (×3): 10 meq via INTRAVENOUS
  Filled 2017-04-18 (×2): qty 50

## 2017-04-18 MED ORDER — METOLAZONE 5 MG PO TABS
10.0000 mg | ORAL_TABLET | Freq: Once | ORAL | Status: AC
  Administered 2017-04-18: 10 mg via ORAL
  Filled 2017-04-18: qty 2

## 2017-04-18 MED ORDER — ACETAMINOPHEN 160 MG/5ML PO SOLN
650.0000 mg | Freq: Four times a day (QID) | ORAL | Status: DC | PRN
Start: 2017-04-18 — End: 2017-04-24
  Administered 2017-04-20 – 2017-04-24 (×8): 650 mg
  Filled 2017-04-18 (×8): qty 20.3

## 2017-04-18 MED ORDER — TICAGRELOR 90 MG PO TABS: 90.0000 mg | ORAL_TABLET | Freq: Two times a day (BID) | ORAL | Status: DC

## 2017-04-18 MED ORDER — POTASSIUM CHLORIDE 20 MEQ/15ML (10%) PO SOLN
40.0000 meq | Freq: Three times a day (TID) | ORAL | Status: AC
  Administered 2017-04-18 (×2): 40 meq
  Filled 2017-04-18 (×2): qty 30

## 2017-04-18 MED ORDER — SODIUM CHLORIDE 0.9 % IV SOLN
1.0000 g | Freq: Three times a day (TID) | INTRAVENOUS | Status: DC
  Administered 2017-04-18 – 2017-04-27 (×28): 1 g via INTRAVENOUS
  Filled 2017-04-18 (×29): qty 1

## 2017-04-18 MED ORDER — TICAGRELOR 90 MG PO TABS: 180.0000 mg | ORAL_TABLET | Freq: Once | ORAL | Status: DC

## 2017-04-18 MED ORDER — TICAGRELOR 90 MG PO TABS
90.0000 mg | ORAL_TABLET | Freq: Two times a day (BID) | ORAL | Status: DC
  Administered 2017-04-18 – 2017-04-27 (×18): 90 mg
  Filled 2017-04-18 (×19): qty 1

## 2017-04-18 MED ORDER — DEXMEDETOMIDINE HCL IN NACL 400 MCG/100ML IV SOLN
0.4000 ug/kg/h | INTRAVENOUS | Status: DC
  Administered 2017-04-18 – 2017-04-24 (×38): 1.2 ug/kg/h via INTRAVENOUS
  Administered 2017-04-24: 0.7 ug/kg/h via INTRAVENOUS
  Administered 2017-04-24: 1.2 ug/kg/h via INTRAVENOUS
  Filled 2017-04-18 (×42): qty 100

## 2017-04-18 NOTE — Progress Notes (Signed)
eLink Physician-Brief Progress Note Patient Name: Ronald HoveGary [name] DOB: Mar 29, 1951 MRN: [id number]   Date of Service  04/18/2017  HPI/Events of Note  Fever to 100.8 F - Already on antibiotics. AST and ALT both normal.   eICU Interventions  Will order: 1. Tylenol liquid 650 mg via tube Q 6 hours PRN Temp > 101.0 F.     Intervention Category Major Interventions: Infection - evaluation and management  Callan Yontz Eugene 04/18/2017, 3:50 AM

## 2017-04-18 NOTE — Progress Notes (Signed)
Patient ID: [name], male   DOB: [birth year], 66 y.o.   MRN: [id number] EVENING ROUNDS NOTE :     [address]       [place], [id number]             [phone]                 1 Day Post-Op Procedure(s) (LRB): REMOVAL OF IMPELLA LEFT VENTRICULAR ASSIST DEVICE (N/A)  Total Length of Stay:  LOS: 7 days  BP 100/65   Pulse 80   Temp 100 F (37.8 C)   Resp 15   Ht 5\' 7"  (1.702 m)   Wt 223 lb 1.7 oz (101.2 kg)   SpO2 100%   BMI 34.94 kg/m   .Intake/Output      11/20 0701 - 11/21 0700   I.V. (mL/kg) 1263.3 (12.5)   Other    NG/GT 600   IV Piggyback 250   Total Intake(mL/kg) 2113.3 (20.9)   Urine (mL/kg/hr) 3600 (2.9)   Emesis/NG output    Stool    Blood    Chest Tube    Total Output 3600   Net -1486.7         . sodium chloride Stopped (04/17/17 2130)  . sodium chloride    . sodium chloride 10 mL/hr at 04/18/17 1800  . sodium chloride    . amiodarone 30 mg/hr (04/18/17 1800)  . dexmedetomidine (PRECEDEX) IV infusion 1.2 mcg/kg/hr (04/18/17 1829)  . epinephrine Stopped (04/17/17 0615)  . feeding supplement (VITAL HIGH PROTEIN) 1,000 mL (04/18/17 1813)  . fentaNYL infusion INTRAVENOUS 400 mcg/hr (04/18/17 1800)  . furosemide (LASIX) infusion 10 mg/hr (04/18/17 1800)  . lactated ringers    . lactated ringers 10 mL/hr at 04/18/17 1800  . lactated ringers Stopped (04/17/17 2133)  . meropenem (MERREM) IV Stopped (04/18/17 1414)  . nitroGLYCERIN Stopped (04/12/17 2200)  . norepinephrine (LEVOPHED) Adult infusion 3.013 mcg/min (04/18/17 1800)  . potassium chloride Stopped (04/18/17 1338)  . vancomycin Stopped (04/18/17 1338)     Lab Results  Component Value Date   WBC 13.8 (H) 04/18/2017   HGB 9.7 (L) 04/18/2017   HCT 32.5 (L) 04/18/2017   PLT 144 (L) 04/18/2017   GLUCOSE 146 (H) 04/18/2017   CHOL 167 04/11/2017   TRIG 109 04/11/2017   HDL 27 (L) 04/11/2017   LDLCALC 118 (H) 04/11/2017   ALT 27 04/17/2017   AST 33 04/17/2017   NA 134 (L)  04/18/2017   K 3.8 04/18/2017   CL 94 (L) 04/18/2017   CREATININE 1.03 04/18/2017   BUN 18 04/18/2017   CO2 32 04/18/2017   INR 1.27 04/18/2017  remains on vent  bp labile  On lasix drip, increased to 10 mg hour    Delight OvensEdward B Corneilus Heggie MD  [facility]eeper [phone] Office [phone] 04/18/2017 7:04 PM

## 2017-04-18 NOTE — Progress Notes (Signed)
Advanced Heart Failure Rounding Note   Subjective:    - S/P LV pseudoaneurysm repeat 11/14 Impella 5.0 Placed.  - Chest closed 11/16.  - Started on broad spectrum abx on 11/17 for fever and leukocytosis.  - Impella noted to have poor waveform am of 11/19. Thought to have possible clot.  Heparin restarted and pump pulled afternoon of 04/17/17.  Coox 56% this am off impella. He is off pressor support. T max 100.9. Has been on vanc and zosyn. Swab from penis with ESBL.   Up 6 lbs and + 2.3 L. (Lasix cut back yesterday). CVP back up to 14-15.  Awake and alert on vent. Shakes head no when asked if he is SOB. Denies pain.   LDH 361 -> 281 -> 282 on Bival.  Creatinine stable. Hgb 9.7. PLTs improved at 144 k.  Remains on on bridge dose cangrelor. P2Y12 is inhibited at 100.   CXR this am. Images reviewed personally. Appears to have worsened vascular congestion.   Swan #s as of 0622 CVP 14-15 PAP 57/28 (37) CO 5.3 CI 2.4   Objective:   Weight Range: 223 lb 1.7 oz (101.2 kg) Body mass index is 34.94 kg/m.   Vital Signs:   Temp:  [100 F (37.8 C)-100.9 F (38.3 C)] 100.6 F (38.1 C) (11/20 0618) Pulse Rate:  [58-89] 71 (11/20 0618) Resp:  [0-28] 16 (11/20 0618) BP: (80-162)/(52-78) 156/74 (11/20 0400) SpO2:  [94 %-100 %] 98 % (11/20 0618) Arterial Line BP: (49-199)/(39-75) 199/69 (11/20 0618) FiO2 (%):  [40 %] 40 % (11/20 0400) Weight:  [223 lb 1.7 oz (101.2 kg)] 223 lb 1.7 oz (101.2 kg) (11/20 0618) Last BM Date: 04/17/17  Weight change: Filed Weights   04/16/17 0700 04/17/17 0400 04/18/17 0618  Weight: 225 lb 5 oz (102.2 kg) 217 lb 13 oz (98.8 kg) 223 lb 1.7 oz (101.2 kg)    Intake/Output:   Intake/Output Summary (Last 24 hours) at 04/18/2017 0720 Last data filed at 04/18/2017 0600 Gross per 24 hour  Intake 4974.19 ml  Output 2665 ml  Net 2309.19 ml      Physical Exam   CVP 14-15  General: Intubated. Awake on vent.  HEENT: + ETT Neck: Supple. JVP  12 cm+. Carotids 2+ bilat; no bruits. No thyromegaly or nodule noted. Cor: RRR. No M/G/R noted.  Lungs: Diminished basilar sounds. + mechanical breathing sounds.  Abdomen: Soft, non-tender, non-distended, no HSM. No bruits or masses. +BS  Extremities: No cyanosis, clubbing, or rash. Trace to 1+ BLE edema. Cool to the touch.  Neuro: Intubated. Nods to questioning. Follows commands.   Telemetry   NSR 70s, Personally reviewed.   EKG   N/A  Labs    CBC Recent Labs    04/17/17 0504 04/17/17 1640 04/18/17 0445  WBC 11.9*  --  13.8*  HGB 10.0* 10.2* 9.7*  HCT 31.9* 30.0* 32.5*  MCV 90.4  --  91.5  PLT 102*  --  670*   Basic Metabolic Panel Recent Labs    04/17/17 0504 04/17/17 1640 04/18/17 0445  NA 134* 134* 134*  K 3.3* 4.1 3.8  CL 95* 92* 94*  CO2 32  --  32  GLUCOSE 130* 139* 146*  BUN 12 15 18   CREATININE 0.99 1.10 1.03  CALCIUM 7.7*  --  7.6*   Liver Function Tests Recent Labs    04/16/17 0343 04/17/17 0504  AST 43* 33  ALT 25 27  ALKPHOS 49 57  BILITOT 1.4* 1.0  PROT 5.1* 5.1*  ALBUMIN 2.4* 2.4*   No results for input(s): LIPASE, AMYLASE in the last 72 hours. Cardiac Enzymes No results for input(s): CKTOTAL, CKMB, CKMBINDEX, TROPONINI in the last 72 hours.  BNP: BNP (last 3 results) No results for input(s): BNP in the last 8760 hours.  ProBNP (last 3 results) No results for input(s): PROBNP in the last 8760 hours.   D-Dimer No results for input(s): DDIMER in the last 72 hours. Hemoglobin A1C No results for input(s): HGBA1C in the last 72 hours. Fasting Lipid Panel No results for input(s): CHOL, HDL, LDLCALC, TRIG, CHOLHDL, LDLDIRECT in the last 72 hours. Thyroid Function Tests No results for input(s): TSH, T4TOTAL, T3FREE, THYROIDAB in the last 72 hours.  Invalid input(s): FREET3  Other results:   Imaging    No results found.   Medications:     Scheduled Medications: . aspirin  81 mg Oral Daily  . bisacodyl  10 mg Oral  Daily   Or  . bisacodyl  10 mg Rectal Daily  . chlorhexidine gluconate (MEDLINE KIT)  15 mL Mouth Rinse BID  . Chlorhexidine Gluconate Cloth  6 each Topical Daily  . enoxaparin (LOVENOX) injection  40 mg Subcutaneous Q24H  . feeding supplement (PRO-STAT SUGAR FREE 64)  30 mL Oral BID  . fentaNYL (SUBLIMAZE) injection  50 mcg Intravenous Once  . insulin aspart  0-24 Units Subcutaneous Q4H  . mouth rinse  15 mL Mouth Rinse 10 times per day  . pantoprazole sodium  40 mg Per Tube Daily  . sodium chloride flush  10-40 mL Intracatheter Q12H  . sodium chloride flush  3 mL Intravenous Q12H    Infusions: . sodium chloride Stopped (04/17/17 2130)  . sodium chloride    . sodium chloride Stopped (04/14/17 1015)  . sodium chloride    . amiodarone 30 mg/hr (04/18/17 0600)  . cangrelor 50 mg in NS 250 mL 0.75 mcg/kg/min (04/18/17 0600)  . dexmedetomidine (PRECEDEX) IV infusion 0.9 mcg/kg/hr (04/18/17 0600)  . epinephrine Stopped (04/17/17 0615)  . feeding supplement (VITAL HIGH PROTEIN) 1,000 mL (04/18/17 0600)  . fentaNYL infusion INTRAVENOUS 350 mcg/hr (04/18/17 0600)  . furosemide (LASIX) infusion 5 mg/hr (04/18/17 0600)  . lactated ringers    . lactated ringers 10 mL/hr at 04/18/17 0600  . lactated ringers Stopped (04/17/17 2133)  . nitroGLYCERIN Stopped (04/12/17 2200)  . norepinephrine (LEVOPHED) Adult infusion Stopped (04/18/17 0341)  . piperacillin-tazobactam (ZOSYN)  IV 3.375 g (04/18/17 0603)  . potassium chloride Stopped (04/17/17 0756)  . vancomycin Stopped (04/17/17 2300)    PRN Medications: sodium chloride, acetaminophen (TYLENOL) oral liquid 160 mg/5 mL, albuterol, fentaNYL, Gerhardt's butt cream, lactated ringers, midazolam, ondansetron (ZOFRAN) IV, potassium chloride, sodium chloride flush, sodium chloride flush    Patient Profile   Mr Gotay is 66 year old with h/o MI, HTN, COPD admitted with chest pain. EKG with evidence of inferior/lateral STEMI. Due to lethargy and  hypoxemia he was intubated in the ED.  Taken to cath lab with RCA DES and POBA to chronically occluded LAD with IABP placed.   Had LV pseudo aneurysm repair 11/14 .   Assessment/Plan   1. CAD with OOH anterior MI c/b LV pseudoaneurysm and cardiogenic shock - attempted PCI of LAD failed due to chronic occlusion and extensive infarct - s/p PCI/DES to distal RCA on bridge dose cangrelor now. P2Y12 inhibited - No change.   2.  S/P LV pseudoaneurysm repair 11/14 - Chest closed 04/13/17. Stable.  3. Acute  systolic HF-> cardiogenic shock - Remaons on Impella support. EF ~25% - Impella pulled 04/17/17 with concerns for clot on end of pump.  - RV moderately HK on echo - Swan numbers as above. - CXR worse this am. CVP 14-15 cm. Increase lasix gtt back to 10 mg/hr. May need dose of metolazone.  - LDH stable. Will stop following now that impella is out.  - Pressures much improved, into 140s today off of ART line. OFF pressor support. Will use BP for diuresis at this time. Will try to add low dose HF meds in the coming days.  - Avoid BB for now with recent need for pressors.   4. Acute Hypoxic Respiratory Failure  - Intubated 11/13. Weaning as tolerated now that impella is out.  - Volume status again elevated by exam and CXR.  - Increase lasix gtt back to 10 mg/hr.   5. AKI  - Due to ATN shock  - Resolved.  6. COPD - Stable. No wheeze on exam.   7. Thrombocytopenia - Improved to 144k. HIT panel borderline.   8. Hypokalemia - K 3.8. Will supp with diuresis.   9. Fever - Spiked fever again overnight at 100.9. WBC up to 13.8 from 11.9. - Day 4 vanc/zosyn - Cultures 04/15/17 ESBL from skin culture (penile meatus)  Length of Stay: [place], [place]  04/18/2017, 7:20 AM  Advanced Heart Failure Team Pager [phone] (M-F; 7a - 4p)  Please contact [facility] for night-coverage after hours (4p -7a ) and weekends on [email or web address]  Agree.   Remains very tenuous. Impella  removed yesterday. Agitated on vent despite precedex. When not agitated BP is low. When agitated BP is high. Has marked volume overload with worsening ABG and CXR. Skin culture growing ESBL. EF 40% on TEE. Co-ox 56%  Exam Agitated on vent Cor RRR Lungs rhonchi Ab soft NT/ND Ext 2+ edema  Will increase diuretics. Switch antibiotics to meropenem. Continue supportive care for HF. CCM to help manage vent. Restart Brilinta snd stop cangrelor.  CRITICAL CARE Performed by: Glori Bickers  Total critical care time: 35 minutes  Critical care time was exclusive of separately billable procedures and treating other patients.  Critical care was necessary to treat or prevent imminent or life-threatening deterioration.  Critical care was time spent personally by me (independent of midlevel providers or residents) on the following activities: development of treatment plan with patient and/or surrogate as well as nursing, discussions with consultants, evaluation of patient's response to treatment, examination of patient, obtaining history from patient or surrogate, ordering and performing treatments and interventions, ordering and review of laboratory studies, ordering and review of radiographic studies, pulse oximetry and re-evaluation of patient's condition.  Glori Bickers, MD  8:24 AM

## 2017-04-18 NOTE — Progress Notes (Signed)
PULMONARY / CRITICAL CARE MEDICINE   Name: Arlana HoveGary Raybourn MRN: [id number] DOB: Jun 26, 1950    ADMISSION DATE:  04/11/2017 CONSULTATION DATE:  04/11/17  REFERRING MD:  Eldridge DaceVaranasi  CHIEF COMPLAINT:  STEMI  HISTORY OF PRESENT ILLNESS:  Arlana HoveGary Gillispie is a 66 y.o. male with PMH of CAD and HTN.  [facility] with sudden onset of SOB and chest pain.  He was found to have inferolateral STEMI and was taken urgently to cath lab.  Prior to cath lab, he was intubated; therefore, PCCM asked to see in consultation to assist with vent management.  In cath lab, he was found to have 100% stenosed mid LAD lesion that was balloon angioplastied with 25% residual stenosis, distal RCA lesion which had DES placed, Ost 1st diag lesion 50% stenosed, proximal ramus 70% stenosed.  IABP was also placed. Has had ongoing shock, pressor dependent.   SUBJECTIVE:   Impala out and now off pressors  VITAL SIGNS: BP (!) 136/53   Pulse 75   Temp 100.2 F (37.9 C)   Resp (!) 21   Ht 5\' 7"  (1.702 m)   Wt 101.2 kg (223 lb 1.7 oz)   SpO2 97%   BMI 34.94 kg/m   HEMODYNAMICS: PAP: (33-64)/(16-30) 49/27 CVP:  [11 mmHg-26 mmHg] 21 mmHg PCWP:  [18 mmHg-24 mmHg] 24 mmHg CO:  [4.2 L/min-4.7 L/min] 4.2 L/min CI:  [2 L/min/m2-2.2 L/min/m2] 2 L/min/m2  VENTILATOR SETTINGS: Vent Mode: PCV FiO2 (%):  [40 %] 40 % Set Rate:  [16 bmp-19 bmp] 19 bmp PEEP:  [5 cmH20] 5 cmH20 Plateau Pressure:  [21 cmH20-30 cmH20] 30 cmH20  INTAKE / OUTPUT: I/O last 3 completed shifts: In: 8015.6 [I.V.:5223.4; Other:178; NW/GN:5621.3G/GT:1564.2; IV Piggyback:1050] Out: 6465 [Urine:5385; Emesis/NG output:800; Stool:100; Blood:50; Chest Tube:130]   PHYSICAL EXAMINATION: General: Adult male, acutely ill appearing, NAD  Neuro: Alert and interactive, agitated at times, moving all ext to command HEENT: [place]/AT. PERRL, EOM_I and MMM Cardiovascular: RRR, Nl S1/S2, -M/R/G. Lungs: Coarse BS diffusely Abdomen: BS x 4, soft, NT/ND.  Musculoskeletal:  No gross deformities, no edema.  Skin: Pale, warm, no rashes.  LABS:  BMET Recent Labs  Lab 04/16/17 0343  04/17/17 0504 04/17/17 1640 04/18/17 0445  NA 133*   < > 134* 134* 134*  K 3.6   < > 3.3* 4.1 3.8  CL 99*   < > 95* 92* 94*  CO2 27  --  32  --  32  BUN 14   < > 12 15 18   CREATININE 0.98   < > 0.99 1.10 1.03  GLUCOSE 114*   < > 130* 139* 146*   < > = values in this interval not displayed.    Electrolytes Recent Labs  Lab 04/12/17 1248 04/13/17 0348 04/13/17 1615  04/16/17 0343 04/17/17 0504 04/18/17 0445  CALCIUM  --  8.9  --    < > 7.8* 7.7* 7.6*  MG 1.6* 2.3 1.8  --   --   --   --   PHOS 2.8  --   --   --   --   --   --    < > = values in this interval not displayed.    CBC Recent Labs  Lab 04/16/17 0343  04/17/17 0504 04/17/17 1640 04/18/17 0445  WBC 15.8*  --  11.9*  --  13.8*  HGB 10.5*   < > 10.0* 10.2* 9.7*  HCT 32.7*   < > 31.9* 30.0* 32.5*  PLT  77*  --  102*  --  144*   < > = values in this interval not displayed.    Coag's Recent Labs  Lab 04/11/17 2138 04/12/17 2147  04/17/17 0504 04/17/17 1650 04/18/17 0445  APTT 30 41*   < > 68* 44* 37*  INR 1.28 0.99  --   --   --  1.27   < > = values in this interval not displayed.    Sepsis Markers Recent Labs  Lab 04/12/17 1248 04/12/17 1254 04/13/17 0832 04/13/17 1207  LATICACIDVEN  --  0.9 2.4* 1.6  PROCALCITON 1.16  --   --   --     ABG Recent Labs  Lab 04/18/17 0355 04/18/17 0721 04/18/17 0756  PHART 7.270* 7.383 7.387  PCO2ART 76.4* 63.9* 62.9*  PO2ART 93.8 98.0 98.0    Liver Enzymes Recent Labs  Lab 04/15/17 0257 04/16/17 0343 04/17/17 0504  AST 47* 43* 33  ALT 25 25 27   ALKPHOS 38 49 57  BILITOT 2.0* 1.4* 1.0  ALBUMIN 2.4* 2.4* 2.4*    Cardiac Enzymes Recent Labs  Lab 04/12/17 0057 04/12/17 0605 04/12/17 1248  TROPONINI 0.11* 0.18* 0.29*    Glucose Recent Labs  Lab 04/17/17 1137 04/17/17 1544 04/17/17 2000 04/17/17 2357 04/18/17 0448  04/18/17 0845  GLUCAP 115* 125* 131* 140* 133* 125*    Imaging Dg Chest Port 1 View  Result Date: 04/18/2017 CLINICAL DATA:  Ventilator dependent respiratory failure. Postop day 6 resection of left ventricular aneurysm. EXAM: PORTABLE CHEST 1 VIEW COMPARISON:  04/17/2017 and earlier. FINDINGS: Sternotomy. Cardiac silhouette moderately enlarged, unchanged. Endotracheal tube tip in satisfactory position projecting approximately 7 cm above the carina. Right jugular central venous catheter tip projects over the upper SVC, unchanged. Left subclavian Swan-Ganz catheter tip projects over the proximal right main pulmonary artery, unchanged. Feeding tube courses below the diaphragm into the stomach. Mild pulmonary venous hypertension without overt edema. Moderate-sized bilateral pleural effusions and associated consolidation in the lower lobes, unchanged since yesterday. No new pulmonary parenchymal abnormalities. IMPRESSION: 1.  Support apparatus satisfactory. 2. Stable bilateral pleural effusions and associated passive atelectasis in the lower lobes. No new abnormalities. 3. Stable cardiomegaly. Pulmonary venous hypertension without overt edema. Electronically Signed   By: Hulan Saas M.D.   On: 04/18/2017 09:15     STUDIES:  CXR 11/13 > possible PNA. Echo TEE 11/16 >     Right ventricle: Prior to chest closure, the RV size and systolic function appeared adequate. Following chest closure there was some reduction RV systolic function, but there was no RV enlargement or septal shift.  Mitral valve: The mitral valve leaflets were of normal thickness. There was good leaflet separation. There was normal leaflet coptation without prolapsing or flail leaflet segments. There was trace mitral insufficiency. The impella cannula did not interfere with the mitral valve apparatus.  Tricuspid valve: Trace regurgitation. The tricuspid valve regurgitation jet is central.     CULTURES: Blood 11/13 >  Sputum  11/13 >   ANTIBIOTICS: Ceftriaxone 11/13 > 11/17 Azithromycin 11/13 > 11/17 vanc 11/17>>> Zosyn 11/17>>>  SIGNIFICANT EVENTS: 11/13 > admit.  LINES/TUBES: ETT 11/13 >  R femoral sheath 11/13 >  R IJ CVL (bensimohn) 11/14>>>  DISCUSSION: 66 y.o. male admitted 11/13 with inferolateral STEMI.  He was intubated and was then taken to cath lab where he was found to have 100% stenosed mid LAD lesion that was balloon angioplastied with 25% residual stenosis, distal RCA lesion which had DES placed, Suezanne Jacquet  1st diag lesion 50% stenosed, proximal ramus 70% stenosed.  IABP was also placed. On f/u echo had ?MV rupture - taken urgently to OR.  Had LV pseudoaneurysm rupture, repaired, impala placed.   ASSESSMENT / PLAN:  PULMONARY A: Acute respiratory failure - r/t STEMI ? PNA - bilateral patchiness - ?aspiration v edema  P:   Begin PS trials, no extubation today given fluid status F/u CXR  F/u ABG [id number]-[id number] Needs aggressive diureses Continue empiric abx as above   CARDIOVASCULAR A:  Inferolateral STEMI - s/p cath lab where he was found to have 100% stenosed mid LAD lesion that was balloon angioplastied with 25% residual stenosis, distal RCA lesion which had DES placed, Ost 1st diag lesion 50% stenosed, proximal ramus 70% stenosed.  IABP was also placed. LV pseudoaneurysm rupture - to OR emergently 11/14, chest closure 11/16 Hypotension - suspect sedation related (was normotensive on arrival to ICU but was somewhat agitated so got additional sedation and later became hypotensive). Hx HTN, CAD. P:  Cardiology/ CVTS managing. Wean levophed as able  Follow CVPs  Diuresing well on lasix gtt  Add zaroxolyn 10 mg PO Continue amiodarone  Anticoagulation per cards - bivalirudin, cangrelor   RENAL A:   NAGMA. AKI - improving.  Good UOP P:   Lasix drip increase to 10 mg/hr Zaroxolyn 10 mg PO x1 BMP in AM. Replace electrolytes as needed   GASTROINTESTINAL A:   GI  prophylaxis. Nutrition. P:   SUP: Pantoprazole.  HEMATOLOGIC A:   VTE Prophylaxis. P:  Anticoagulation as above  CBC in AM.  INFECTIOUS A:   Possible PNA. P:   Broad spectrum abx as above  Follow cultures - neg to date  Trend wbc, fever curve   ENDOCRINE A:   No acute issues. P:   Monitor glucose on chem   NEUROLOGIC A:   Sedation due to mechanical ventilation. P:   Sedation:  Precedex, fentanyl, PRN versed  RASS goal: 0 to -1. Daily WUA. Will increase precedex ceiling to 1.2  Family updated: No family bedside 11/20.  Begin PS trials, need aggressive diureses  Interdisciplinary Family Meeting v Palliative Care Meeting:  Due by: 04/17/17.  The patient is critically ill with multiple organ systems failure and requires high complexity decision making for assessment and support, frequent evaluation and titration of therapies, application of advanced monitoring technologies and extensive interpretation of multiple databases.   Critical Care Time devoted to patient care services described in this note is  35  Minutes. This time reflects time of care of this signee Dr Koren BoundWesam Zoie Sarin. This critical care time does not reflect procedure time, or teaching time or supervisory time of PA/NP/Med student/Med Resident etc but could involve care discussion time.  Alyson ReedyWesam G. Shatori Bertucci, M.D. [facility]. Pager: [phone]. After hours pager: [phone].

## 2017-04-18 NOTE — Progress Notes (Signed)
RN says MD just flipped patient over to wean.

## 2017-04-18 NOTE — Progress Notes (Signed)
Patient rapidly desated down to 38 with agonal breathing and distress. Patient returned to full support and now has sat of 91%. Vitals are stable. RT will continue to monitor.

## 2017-04-18 NOTE — Progress Notes (Addendum)
TCTS DAILY ICU PROGRESS NOTE                   [address]            [place] [address]          [phone]   1 Day Post-Op Procedure(s) (LRB): REMOVAL OF IMPELLA LEFT VENTRICULAR ASSIST DEVICE (N/A)  Total Length of Stay:  LOS: 7 days   Subjective:  Patient remains intubated, despite large amounts of sedation he is awake and follows commands.  Impella removed yesterday  Objective: Vital signs in last 24 hours: Temp:  [100 F (37.8 C)-100.9 F (38.3 C)] 100.4 F (38 C) (11/20 0900) Pulse Rate:  [58-80] 70 (11/20 0900) Cardiac Rhythm: Normal sinus rhythm (11/20 0800) Resp:  [0-28] 17 (11/20 0900) BP: (84-162)/(52-91) 136/63 (11/20 0900) SpO2:  [94 %-100 %] 100 % (11/20 0900) Arterial Line BP: (71-199)/(39-75) 158/55 (11/20 0900) FiO2 (%):  [40 %] 40 % (11/20 0743) Weight:  [223 lb 1.7 oz (101.2 kg)] 223 lb 1.7 oz (101.2 kg) (11/20 0618)  Filed Weights   04/16/17 0700 04/17/17 0400 04/18/17 0618  Weight: 225 lb 5 oz (102.2 kg) 217 lb 13 oz (98.8 kg) 223 lb 1.7 oz (101.2 kg)    Weight change: 5 lb 4.7 oz (2.4 kg)   Hemodynamic parameters for last 24 hours: PAP: (33-64)/(16-30) 48/23 CVP:  [11 mmHg-26 mmHg] 20 mmHg PCWP:  [18 mmHg-24 mmHg] 24 mmHg CO:  [4.2 L/min-4.7 L/min] 4.2 L/min CI:  [2 L/min/m2-2.2 L/min/m2] 2 L/min/m2  Intake/Output from previous day: 11/19 0701 - 11/20 0700 In: 4974.2 [I.V.:3437.8; NG/GT:924.2; IV Piggyback:600] Out: 2665 [Urine:2225; Emesis/NG output:200; Stool:100; Blood:50; Chest Tube:90]  Intake/Output this shift: Total I/O In: 461 [I.V.:311; NG/GT:150] Out: 550 [Urine:550]  Current Meds: Scheduled Meds: . aspirin  81 mg Oral Daily  . bisacodyl  10 mg Oral Daily   Or  . bisacodyl  10 mg Rectal Daily  . chlorhexidine gluconate (MEDLINE KIT)  15 mL Mouth Rinse BID  . Chlorhexidine Gluconate Cloth  6 each Topical Daily  . enoxaparin (LOVENOX) injection  40 mg Subcutaneous Q24H  . feeding supplement (PRO-STAT  SUGAR FREE 64)  30 mL Oral BID  . fentaNYL (SUBLIMAZE) injection  50 mcg Intravenous Once  . insulin aspart  0-24 Units Subcutaneous Q4H  . mouth rinse  15 mL Mouth Rinse 10 times per day  . pantoprazole sodium  40 mg Per Tube Daily  . sodium chloride flush  10-40 mL Intracatheter Q12H  . sodium chloride flush  3 mL Intravenous Q12H  . ticagrelor  90 mg Per Tube BID   Continuous Infusions: . sodium chloride Stopped (04/17/17 2130)  . sodium chloride    . sodium chloride Stopped (04/14/17 1015)  . sodium chloride    . amiodarone 30 mg/hr (04/18/17 0900)  . dexmedetomidine (PRECEDEX) IV infusion 0.9 mcg/kg/hr (04/18/17 0900)  . epinephrine Stopped (04/17/17 0615)  . feeding supplement (VITAL HIGH PROTEIN) 1,000 mL (04/18/17 0900)  . fentaNYL infusion INTRAVENOUS 400 mcg/hr (04/18/17 0900)  . furosemide (LASIX) infusion 5 mg/hr (04/18/17 0900)  . lactated ringers    . lactated ringers 10 mL/hr at 04/18/17 0900  . lactated ringers Stopped (04/17/17 2133)  . meropenem (MERREM) IV    . nitroGLYCERIN Stopped (04/12/17 2200)  . norepinephrine (LEVOPHED) Adult infusion Stopped (04/18/17 0341)  . potassium chloride 10 mEq (04/18/17 0853)  . potassium chloride    . vancomycin Stopped (04/17/17 2300)     PRN Meds:.sodium chloride, acetaminophen (TYLENOL) oral liquid 160 mg/5 mL, albuterol, fentaNYL, 's butt cream, lactated ringers, midazolam, ondansetron (ZOFRAN) IV, potassium chloride, potassium chloride, sodium chloride flush, sodium chloride flush  General appearance: alert and remains on vent, follows commands Heart: regular rate and rhythm Lungs: clear to auscultation bilaterally Abdomen: + distention, hyperactive BS Extremities: edema trace to 1+ Wound: clean and dry  Lab Results: CBC: Recent Labs    04/17/17 0504 04/17/17 1640 04/18/17 0445  WBC 11.9*  --  13.8*  HGB 10.0* 10.2* 9.7*  HCT 31.9* 30.0* 32.5*  PLT 102*  --  144*   BMET:  Recent Labs     04/17/17 0504 04/17/17 1640 04/18/17 0445  NA 134* 134* 134*  K 3.3* 4.1 3.8  CL 95* 92* 94*  CO2 32  --  32  GLUCOSE 130* 139* 146*  BUN 12 15 18  CREATININE 0.99 1.10 1.03  CALCIUM 7.7*  --  7.6*    CMET: Lab Results  Component Value Date   WBC 13.8 (H) 04/18/2017   HGB 9.7 (L) 04/18/2017   HCT 32.5 (L) 04/18/2017   PLT 144 (L) 04/18/2017   GLUCOSE 146 (H) 04/18/2017   CHOL 167 04/11/2017   TRIG 109 04/11/2017   HDL 27 (L) 04/11/2017   LDLCALC 118 (H) 04/11/2017   ALT 27 04/17/2017   AST 33 04/17/2017   NA 134 (L) 04/18/2017   K 3.8 04/18/2017   CL 94 (L) 04/18/2017   CREATININE 1.03 04/18/2017   BUN 18 04/18/2017   CO2 32 04/18/2017   INR 1.27 04/18/2017      PT/INR:  Recent Labs    04/18/17 0445  LABPROT 15.8*  INR 1.27   Radiology: Dg Chest Port 1 View  Result Date: 04/18/2017 CLINICAL DATA:  Ventilator dependent respiratory failure. Postop day 6 resection of left ventricular aneurysm. EXAM: PORTABLE CHEST 1 VIEW COMPARISON:  04/17/2017 and earlier. FINDINGS: Sternotomy. Cardiac silhouette moderately enlarged, unchanged. Endotracheal tube tip in satisfactory position projecting approximately 7 cm above the carina. Right jugular central venous catheter tip projects over the upper SVC, unchanged. Left subclavian Swan-Ganz catheter tip projects over the proximal right main pulmonary artery, unchanged. Feeding tube courses below the diaphragm into the stomach. Mild pulmonary venous hypertension without overt edema. Moderate-sized bilateral pleural effusions and associated consolidation in the lower lobes, unchanged since yesterday. No new pulmonary parenchymal abnormalities. IMPRESSION: 1.  Support apparatus satisfactory. 2. Stable bilateral pleural effusions and associated passive atelectasis in the lower lobes. No new abnormalities. 3. Stable cardiomegaly. Pulmonary venous hypertension without overt edema. Electronically Signed   By: Thomas  Lawrence M.D.   On:  04/18/2017 09:15   Assessment/Plan: S/P Procedure(s) (LRB): REMOVAL OF IMPELLA LEFT VENTRICULAR ASSIST DEVICE (N/A)  1. CV- S/P LV Pseudoaneurym repair, Acute Systolic HF, cardiogenic shock- Impella removed yesterday, AHF following appreciate assistance... Remains on Amiodarone drip 2. Pulm- CCM following, weaning vent as tolerated 3. Renal- creatinine stable, CXR with worsening edema, lasix drip adjusted by AHF 4. ID- started on Mirapenem for E. Coli for penile drainage, remains on Vanc on fever and leukocytosis 5. DM- sugars controlled 6. Dispo- patient weaning vent as tolerated, continue ABX for fever, leukocytosis, E. Coli from penile drainage, continue diuretics, appreciate CCM and AHF assistance     Erin Barrett 04/18/2017 9:37 AM   Continued supportive care by HF and CCM- to decide on vent wean  I have seen and examined [name] and agree with the above assessment  and   plan.  Grace Isaac MD Beeper [phone] Office [phone] 04/18/2017 10:14 AM

## 2017-04-18 NOTE — Op Note (Signed)
NAME:  [name], [name]                ACCOUNT NO.:  [id number]  MEDICThressa ShellerL RECORD NO.:  [id number]  LOCATION:  2H21C                        FACILITY:  [facility]  PHYSICIAN:  Sheliah PlaneEdward Aleathea Pugmire, MD    DATE OF BIRTH:  [birth year]  DATE OF PROCEDURE:  04/17/2017 DATE OF DISCHARGE:                              OPERATIVE REPORT   PREOPERATIVE DIAGNOSIS:  Clotted Impella pump.  POSTOPERATIVE DIAGNOSIS:  Clotted Impella pump.  SURGICAL PROCEDURE:  Removal of Impella 5.0.  SURGEON:  Sheliah PlaneEdward Delcie Ruppert, MD.  FIRST ASSISTANT:  Doree Fudgeonielle Zimmerman, PA.  BRIEF HISTORY:  The patient is a 66 year old male, who the week prior had emergency sternotomy for cardiogenic shock with a large LV aneurysm, question of impending rupture at the time Impella 5.0 pump was placed across the aortic valve for hemodynamic support.  The Impella had been placed through a 10-mm Hemashield graft sewed to the ascending aorta and brought out through a site just above the top of the sternum to the right.  The device had been supplying good hemodynamic support for the patient, who had been gradually recovering.  Tolerated 2 days before closing his sternum.  His drips were on minimal settings.  On the morning of surgery, initially had good flows through the device, but midday the device began alarming for high purge pressures, was thought that this was related to probable clots on the device.  The patient had stabilized hemodynamically well enough that we felt the most prudent treatment would be to remove the device as quickly as possible.  An urgent surgical removal was scheduled.  The patient's sister gave consent over the phone.  DESCRIPTION OF PROCEDURE:  The patient was taken directly to the operating room.  He was already intubated.  He was sedated.  The chest was prepped with Betadine including the Impella device and associated graft.  TEE probe was placed by Dr. Hart RochesterHollis.  The device appeared to be in good position.  No  obvious clots were noted.  At this point, the pump had been turned down, and as we were ready to remove it, was turned off. The sutures on the graft were removed.  The plugs making the graft hemostatic were removed, and with manual pressure, blood flow through the graft was controlled, and the device removed without difficulty. Some blood was flushed through the graft to make sure there were no clots.  The graft was expanded up slightly.  Betadine had previously been painted on the graft and additional Betadine was placed on the graft.  The graft was then divided with a stapler.  Hemostasis was noted.  The graft was then allowed to retract back into the upper portion of the wound with the wound hemostatic.  Interrupted 2-0 Vicryl was placed in the deep tissue and a 3-0 subcuticular stitch in the skin edges.  Dermabond was applied.  The patient remained hemodynamically stable.  It did appear that his ejection fraction had improved closer to 40% than 20% previously.  Blood loss was minimal. Sponge and needle count was reported as correct.  The patient was then returned on the ventilator to the Surgical Intensive Care Unit for further postoperative care.  Sheliah PlaneEdward Dusty Wagoner, MD     EG/MEDQ  D:  04/18/2017  T:  04/18/2017  Job:  [id number]

## 2017-04-18 NOTE — Progress Notes (Signed)
eLink Physician-Brief Progress Note Patient Name: Ronald HoveGary Messing DOB: 1950-11-01 MRN: [id number]   Date of Service  04/18/2017  HPI/Events of Note  ABG on 40%/PC 26/Rate 16/P 5 = 7.27/75.4/93  eICU Interventions  Will order: 1. Increase Rate to 19. 2. ABG at 7 AM.     Intervention Category Major Interventions: Acid-Base disturbance - evaluation and management;Respiratory failure - evaluation and management  Sommer,Steven Eugene 04/18/2017, 5:03 AM

## 2017-04-18 NOTE — Progress Notes (Signed)
eLink Physician-Brief Progress Note Patient Name: Ronald HoveGary [name] DOB: [birth year] MRN: [id number]   Date of Service  04/18/2017  HPI/Events of Note  RN noted that patient has abnormal respirataions, occasional gasping. Follows commands, moves all extrem; ABG 7.24/co2 >91.   eICU Interventions  Change to PRVC, Vt-450, recheck ABG in 1 hr.  Increase sedation, fentanyl drip. Pressors if needed.      Intervention Category Major Interventions: Airway management  Shane Crutchradeep Deshanda Molitor 04/18/2017, 7:32 PM

## 2017-04-18 NOTE — Progress Notes (Addendum)
Pharmacy Antibiotic Note  [name] is a 66 y.o. male admitted on 04/11/2017 with LV rupture, now being consulted for antibiotic treatment for possible bacteremia and ESBL E.Coli perineal infection.   New fevers overnight, wbc trending back up. Patient has been on vancomycin since 11/15 and zosyn 11/17. Perineal cultures show ESBL E.Coli.  Vancomycin trough 20 (at goal). Scr is normal.  Plan: Continue vancomycin 1250mg  IV q12h  Discontinue zosyn 3.375g IV q8 hours Start meropenem 1g Q8h Monitor clinical progression and LOT  Height: 5\' 7"  (170.2 cm) Weight: 223 lb 1.7 oz (101.2 kg) IBW/kg (Calculated) : 66.1  Temp (24hrs), Avg:100.4 F (38 C), Min:100 F (37.8 C), Max:100.9 F (38.3 C)  Recent Labs  Lab 04/12/17 1254  04/13/17 0832 04/13/17 1207  04/14/17 1701 04/15/17 0257 04/15/17 1154  04/16/17 0343 04/16/17 1543 04/16/17 2321 04/17/17 0504 04/17/17 1640 04/18/17 0445 04/18/17 0925  WBC  --    < >  --   --    < > 17.9* 17.4*  --   --  15.8*  --   --  11.9*  --  13.8*  --   CREATININE  --    < >  --   --    < > 0.88 0.92  --    < > 0.98 1.00 1.00 0.99 1.10 1.03  --   LATICACIDVEN 0.9  --  2.4* 1.6  --   --   --   --   --   --   --   --   --   --   --   --   VANCOTROUGH  --   --   --   --   --   --   --  13*  --   --   --   --   --   --   --  20   < > = values in this interval not displayed.    Estimated Creatinine Clearance: 77.8 mL/min (by C-G formula based on SCr of 1.03 mg/dL).    No Known Allergies   Adline PotterSabrina Latausha Flamm, PharmD Pharmacy Resident Pager: [phone]

## 2017-04-18 NOTE — Progress Notes (Signed)
E-Link called due to patient gasping for breaths over ventilator. ABG showed critical CO2 >90 and ph 7.22. Orders to increase sedation and recheck ABG in hour. Settings made to ventilator PRVC from Pressure control FIO2 40 Peep 5 RR 15 TV 450

## 2017-04-19 ENCOUNTER — Inpatient Hospital Stay (HOSPITAL_COMMUNITY): Payer: Medicare Other

## 2017-04-19 DIAGNOSIS — J810 Acute pulmonary edema: Secondary | ICD-10-CM

## 2017-04-19 LAB — BASIC METABOLIC PANEL
Anion gap: 10 (ref 5–15)
BUN: 29 mg/dL — ABNORMAL HIGH (ref 6–20)
CO2: 40 mmol/L — ABNORMAL HIGH (ref 22–32)
Calcium: 8.2 mg/dL — ABNORMAL LOW (ref 8.9–10.3)
Chloride: 85 mmol/L — ABNORMAL LOW (ref 101–111)
Creatinine, Ser: 1.13 mg/dL (ref 0.61–1.24)
GFR calc Af Amer: >60 mL/min (ref >60)
GFR calc non Af Amer: >60 mL/min (ref >60)
Glucose, Bld: 139 mg/dL — ABNORMAL HIGH (ref 65–99)
Potassium: 3.3 mmol/L — ABNORMAL LOW (ref 3.5–5.1)
Sodium: 135 mmol/L (ref 135–145)

## 2017-04-19 LAB — POCT I-STAT 3, ART BLOOD GAS (G3+)
ACID-BASE EXCESS: 17 mmol/L — AB (ref 0.0–2.0)
ACID-BASE EXCESS: 20 mmol/L — AB (ref 0.0–2.0)
Acid-Base Excess: 20 mmol/L — ABNORMAL HIGH (ref 0.0–2.0)
BICARBONATE: 45 mmol/L — AB (ref 20.0–28.0)
Bicarbonate: 44.2 mmol/L — ABNORMAL HIGH (ref 20.0–28.0)
Bicarbonate: 47.2 mmol/L — ABNORMAL HIGH (ref 20.0–28.0)
O2 SAT: 97 %
O2 SAT: 98 %
O2 Saturation: 93 %
PCO2 ART: 69.2 mmHg — AB (ref 32.0–48.0)
PCO2 ART: 69.6 mmHg — AB (ref 32.0–48.0)
PH ART: 7.414 (ref 7.350–7.450)
PH ART: 7.444 (ref 7.350–7.450)
PH ART: 7.55 — AB (ref 7.350–7.450)
Patient temperature: 37.7
TCO2: 46 mmol/L — ABNORMAL HIGH (ref 22–32)
TCO2: 46 mmol/L — ABNORMAL HIGH (ref 22–32)
TCO2: 49 mmol/L — ABNORMAL HIGH (ref 22–32)
pCO2 arterial: 51.4 mmHg — ABNORMAL HIGH (ref 32.0–48.0)
pO2, Arterial: 106 mmHg (ref 83.0–108.0)
pO2, Arterial: 59 mmHg — ABNORMAL LOW (ref 83.0–108.0)
pO2, Arterial: 94 mmHg (ref 83.0–108.0)

## 2017-04-19 LAB — COMPREHENSIVE METABOLIC PANEL
ALT: 51 U/L (ref 17–63)
AST: 40 U/L (ref 15–41)
Albumin: 2.4 g/dL — ABNORMAL LOW (ref 3.5–5.0)
Alkaline Phosphatase: 59 U/L (ref 38–126)
Anion gap: 11 (ref 5–15)
BUN: 26 mg/dL — ABNORMAL HIGH (ref 6–20)
CO2: 38 mmol/L — ABNORMAL HIGH (ref 22–32)
Calcium: 7.9 mg/dL — ABNORMAL LOW (ref 8.9–10.3)
Chloride: 86 mmol/L — ABNORMAL LOW (ref 101–111)
Creatinine, Ser: 1.11 mg/dL (ref 0.61–1.24)
GFR calc Af Amer: >60 mL/min (ref >60)
GFR calc non Af Amer: >60 mL/min (ref >60)
Glucose, Bld: 136 mg/dL — ABNORMAL HIGH (ref 65–99)
Potassium: 2.9 mmol/L — ABNORMAL LOW (ref 3.5–5.1)
Sodium: 135 mmol/L (ref 135–145)
Total Bilirubin: 0.9 mg/dL (ref 0.3–1.2)
Total Protein: 5.6 g/dL — ABNORMAL LOW (ref 6.5–8.1)

## 2017-04-19 LAB — GLUCOSE, CAPILLARY
GLUCOSE-CAPILLARY: 120 mg/dL — AB (ref 65–99)
GLUCOSE-CAPILLARY: 143 mg/dL — AB (ref 65–99)
Glucose-Capillary: 139 mg/dL — ABNORMAL HIGH (ref 65–99)
Glucose-Capillary: 140 mg/dL — ABNORMAL HIGH (ref 65–99)
Glucose-Capillary: 110 mg/dL — ABNORMAL HIGH (ref 65–99)
Glucose-Capillary: 140 mg/dL — ABNORMAL HIGH (ref 65–99)

## 2017-04-19 LAB — CBC
HCT: 33.5 % — ABNORMAL LOW (ref 39.0–52.0)
HEMOGLOBIN: 10.2 g/dL — AB (ref 13.0–17.0)
MCH: 27.8 pg (ref 26.0–34.0)
MCHC: 30.4 g/dL (ref 30.0–36.0)
MCV: 91.3 fL (ref 78.0–100.0)
PLATELETS: 264 10*3/uL (ref 150–400)
RBC: 3.67 MIL/uL — ABNORMAL LOW (ref 4.22–5.81)
RDW: 14.5 % (ref 11.5–15.5)
WBC: 12.5 10*3/uL — ABNORMAL HIGH (ref 4.0–10.5)

## 2017-04-19 LAB — POCT I-STAT, CHEM 8
BUN: 28 mg/dL — ABNORMAL HIGH (ref 6–20)
CALCIUM ION: 1.03 mmol/L — AB (ref 1.15–1.40)
Chloride: 83 mmol/L — ABNORMAL LOW (ref 101–111)
Creatinine, Ser: 1.1 mg/dL (ref 0.61–1.24)
Glucose, Bld: 143 mg/dL — ABNORMAL HIGH (ref 65–99)
HCT: 31 % — ABNORMAL LOW (ref 39.0–52.0)
HEMOGLOBIN: 10.5 g/dL — AB (ref 13.0–17.0)
Potassium: 3 mmol/L — ABNORMAL LOW (ref 3.5–5.1)
SODIUM: 135 mmol/L (ref 135–145)
TCO2: 41 mmol/L — AB (ref 22–32)

## 2017-04-19 LAB — COOXEMETRY PANEL
Carboxyhemoglobin: 1.3 % (ref 0.5–1.5)
Methemoglobin: 1.2 % (ref 0.0–1.5)
O2 Saturation: 68.8 %
Total hemoglobin: 10.1 g/dL — ABNORMAL LOW (ref 12.0–16.0)

## 2017-04-19 LAB — PHOSPHORUS: PHOSPHORUS: 2.3 mg/dL — AB (ref 2.5–4.6)

## 2017-04-19 LAB — POCT I-STAT 4, (NA,K, GLUC, HGB,HCT)
Glucose, Bld: 153 mg/dL — ABNORMAL HIGH (ref 65–99)
HEMATOCRIT: 32 % — AB (ref 39.0–52.0)
HEMOGLOBIN: 10.9 g/dL — AB (ref 13.0–17.0)
POTASSIUM: 3.5 mmol/L (ref 3.5–5.1)
Sodium: 137 mmol/L (ref 135–145)

## 2017-04-19 LAB — MAGNESIUM: MAGNESIUM: 1.9 mg/dL (ref 1.7–2.4)

## 2017-04-19 MED ORDER — ACETAZOLAMIDE 250 MG PO TABS
250.0000 mg | ORAL_TABLET | Freq: Two times a day (BID) | ORAL | Status: DC
  Administered 2017-04-19: 250 mg via ORAL
  Filled 2017-04-19: qty 1

## 2017-04-19 MED ORDER — POTASSIUM CHLORIDE 10 MEQ/50ML IV SOLN
INTRAVENOUS | Status: AC
  Filled 2017-04-19: qty 150

## 2017-04-19 MED ORDER — ASPIRIN 81 MG PO CHEW
81.0000 mg | CHEWABLE_TABLET | Freq: Every day | ORAL | Status: DC
  Administered 2017-04-19 – 2017-04-27 (×9): 81 mg
  Filled 2017-04-19 (×10): qty 1

## 2017-04-19 MED ORDER — ENOXAPARIN SODIUM 30 MG/0.3ML ~~LOC~~ SOLN: 30.0000 mg | SUBCUTANEOUS | Status: DC

## 2017-04-19 MED ORDER — MAGNESIUM SULFATE 2 GM/50ML IV SOLN: 2.0000 g | Freq: Once | INTRAVENOUS | Status: DC

## 2017-04-19 MED ORDER — MAGNESIUM SULFATE 2 GM/50ML IV SOLN
2.0000 g | Freq: Once | INTRAVENOUS | Status: AC
  Administered 2017-04-19: 2 g via INTRAVENOUS
  Filled 2017-04-19: qty 50

## 2017-04-19 MED ORDER — ACETAZOLAMIDE 250 MG PO TABS
250.0000 mg | ORAL_TABLET | Freq: Two times a day (BID) | ORAL | Status: DC
  Administered 2017-04-19: 250 mg
  Filled 2017-04-19: qty 1

## 2017-04-19 MED ORDER — PRO-STAT SUGAR FREE PO LIQD
30.0000 mL | Freq: Two times a day (BID) | ORAL | Status: DC
  Administered 2017-04-19 – 2017-04-27 (×17): 30 mL
  Filled 2017-04-19 (×18): qty 30

## 2017-04-19 MED ORDER — POTASSIUM CHLORIDE 20 MEQ/15ML (10%) PO SOLN
40.0000 meq | Freq: Three times a day (TID) | ORAL | Status: DC
  Administered 2017-04-19: 40 meq
  Filled 2017-04-19: qty 30

## 2017-04-19 MED ORDER — SODIUM CHLORIDE 0.9% FLUSH
10.0000 mL | Freq: Two times a day (BID) | INTRAVENOUS | Status: DC
  Administered 2017-04-19 – 2017-04-26 (×12): 10 mL

## 2017-04-19 MED ORDER — ACETAZOLAMIDE SODIUM 500 MG IJ SOLR
250.0000 mg | Freq: Three times a day (TID) | INTRAMUSCULAR | Status: AC
  Administered 2017-04-19 (×2): 250 mg via INTRAVENOUS
  Filled 2017-04-19 (×2): qty 250

## 2017-04-19 MED ORDER — STERILE WATER FOR INJECTION IJ SOLN
INTRAMUSCULAR | Status: AC
  Filled 2017-04-19: qty 10

## 2017-04-19 MED ORDER — POTASSIUM PHOSPHATES 15 MMOLE/5ML IV SOLN
30.0000 mmol | Freq: Once | INTRAVENOUS | Status: AC
  Administered 2017-04-19: 30 mmol via INTRAVENOUS
  Filled 2017-04-19: qty 10

## 2017-04-19 MED ORDER — ATORVASTATIN CALCIUM 80 MG PO TABS: 80.0000 mg | ORAL_TABLET | Freq: Every day | ORAL | Status: DC

## 2017-04-19 MED ORDER — POTASSIUM CHLORIDE CRYS ER 20 MEQ PO TBCR: 40.0000 meq | EXTENDED_RELEASE_TABLET | Freq: Three times a day (TID) | ORAL | Status: DC

## 2017-04-19 MED ORDER — POTASSIUM CHLORIDE 10 MEQ/50ML IV SOLN
10.0000 meq | INTRAVENOUS | Status: DC
  Administered 2017-04-19 (×2): 10 meq via INTRAVENOUS

## 2017-04-19 MED ORDER — POTASSIUM CHLORIDE 20 MEQ/15ML (10%) PO SOLN: 40.0000 meq | Freq: Three times a day (TID) | ORAL | Status: DC

## 2017-04-19 MED ORDER — SODIUM CHLORIDE 0.9% FLUSH
10.0000 mL | INTRAVENOUS | Status: DC | PRN
Start: 2017-04-19 — End: 2017-04-27

## 2017-04-19 MED ORDER — POTASSIUM CHLORIDE 10 MEQ/50ML IV SOLN
10.0000 meq | INTRAVENOUS | Status: DC
  Filled 2017-04-19: qty 50

## 2017-04-19 MED ORDER — METOLAZONE 5 MG PO TABS
10.0000 mg | ORAL_TABLET | Freq: Once | ORAL | Status: AC
  Administered 2017-04-19: 10 mg via ORAL
  Filled 2017-04-19: qty 2

## 2017-04-19 MED ORDER — POTASSIUM CHLORIDE 10 MEQ/50ML IV SOLN
10.0000 meq | INTRAVENOUS | Status: AC
  Administered 2017-04-19 (×3): 10 meq via INTRAVENOUS

## 2017-04-19 MED ORDER — ATORVASTATIN CALCIUM 80 MG PO TABS
80.0000 mg | ORAL_TABLET | Freq: Every day | ORAL | Status: DC
  Administered 2017-04-19 – 2017-04-26 (×8): 80 mg
  Filled 2017-04-19 (×8): qty 1

## 2017-04-19 NOTE — Progress Notes (Signed)
Peripherally Inserted Central Catheter/Midline Placement  The IV Nurse has discussed with the patient and/or persons authorized to consent for the patient, the purpose of this procedure and the potential benefits and risks involved with this procedure.  The benefits include less needle sticks, lab draws from the catheter, and the patient may be discharged home with the catheter. Risks include, but not limited to, infection, bleeding, blood clot (thrombus formation), and puncture of an artery; nerve damage and irregular heartbeat and possibility to perform a PICC exchange if needed/ordered by physician.  Alternatives to this procedure were also discussed.  Bard Power PICC patient education guide, fact sheet on infection prevention and patient information card has been provided to patient /or left at bedside.    PICC/Midline Placement Documentation     Consent obtained via telephone with sister [name]   [name] 04/19/2017, 2:05 PM

## 2017-04-19 NOTE — Progress Notes (Signed)
Advanced Heart Failure Rounding Note   Subjective:    - S/P LV pseudoaneurysm repeat 11/14 Impella 5.0 Placed.  - Chest closed 11/16.  - Started on broad spectrum abx on 11/17 for fever and leukocytosis.  - Impella noted to have poor waveform am of 11/19. Thought to have possible clot.  Heparin restarted and pump pulled afternoon of 04/17/17.  Coox 68.8% this am.  Tmax 100.6.   Perineal culture + for ESBL. Vanc/Zosyn expanded to Vanc/Meropenem 04/18/17.   Negative 5.5L and down 7 lbs. CVP 10 this am.  Remains intubated and sedated. Vent setting adjusted overnight with CO2 > 91.0. Down to 69.2 this am.   Bicarb 47.2   On levophed at 6. Pressures between 100-120.  Creatinine stable. K 3.0. Hgb 10.5. PLTs improved at 264 k.  Remains on on bridge dose cangrelor. P2Y12 is inhibited at 100.   CXR this am looks relatively unchanged. + pulmonary vascular congestion. Images reviewed personally.   Swan #s as of 0622 CVP 10-11 PAP 42/25 CO 4.2 CI 2.0  Objective:   Weight Range: 216 lb 14.9 oz (98.4 kg) Body mass index is 33.98 kg/m.   Vital Signs:   Temp:  [99.1 F (37.3 C)-100.6 F (38.1 C)] 99.9 F (37.7 C) (11/21 0700) Pulse Rate:  [60-80] 80 (11/21 0700) Resp:  [9-27] 11 (11/21 0700) BP: (94-169)/(48-95) 113/53 (11/21 0600) SpO2:  [91 %-100 %] 100 % (11/21 0700) Arterial Line BP: (93-219)/(27-72) 131/60 (11/21 0300) FiO2 (%):  [40 %] 40 % (11/21 0410) Weight:  [216 lb 14.9 oz (98.4 kg)] 216 lb 14.9 oz (98.4 kg) (11/21 0115) Last BM Date: 04/18/17  Weight change: Filed Weights   04/17/17 0400 04/18/17 0618 04/19/17 0115  Weight: 217 lb 13 oz (98.8 kg) 223 lb 1.7 oz (101.2 kg) 216 lb 14.9 oz (98.4 kg)   Intake/Output:   Intake/Output Summary (Last 24 hours) at 04/19/2017 0716 Last data filed at 04/19/2017 0600 Gross per 24 hour  Intake 4233.2 ml  Output 9750 ml  Net -5516.8 ml    Physical Exam   CVP 10-11 cm  General: Intubated.  HEENT: +  ETT Neck: Supple. JVP elevated. Carotids 2+ bilat; no bruits. No thyromegaly or nodule noted. Cor: PMI nondisplaced. RRR, No M/G/R noted Lungs: Diminished basilar sounds Abdomen: Soft, non-tender, non-distended, no HSM. No bruits or masses. +BS  Extremities: No cyanosis, clubbing, or rash. 2+ edema to knees. Cool to the touch.  Neuro: Intubated.  Telemetry   NSR 70s, personally reviewed.   EKG   N/A  Labs    CBC Recent Labs    04/18/17 0445 04/19/17 0344 04/19/17 0400  WBC 13.8* 12.5*  --   HGB 9.7* 10.2* 10.5*  HCT 32.5* 33.5* 31.0*  MCV 91.5 91.3  --   PLT 144* 264  --    Basic Metabolic Panel Recent Labs    04/18/17 0445 04/19/17 0344 04/19/17 0400  NA 134* 135 135  K 3.8 2.9* 3.0*  CL 94* 86* 83*  CO2 32 38*  --   GLUCOSE 146* 136* 143*  BUN 18 26* 28*  CREATININE 1.03 1.11 1.10  CALCIUM 7.6* 7.9*  --   MG  --  1.9  --   PHOS  --  2.3*  --    Liver Function Tests Recent Labs    04/17/17 0504 04/19/17 0344  AST 33 40  ALT 27 51  ALKPHOS 57 59  BILITOT 1.0 0.9  PROT 5.1* 5.6*  ALBUMIN  2.4* 2.4*   No results for input(s): LIPASE, AMYLASE in the last 72 hours. Cardiac Enzymes No results for input(s): CKTOTAL, CKMB, CKMBINDEX, TROPONINI in the last 72 hours.  BNP: BNP (last 3 results) No results for input(s): BNP in the last 8760 hours.  ProBNP (last 3 results) No results for input(s): PROBNP in the last 8760 hours.   D-Dimer No results for input(s): DDIMER in the last 72 hours. Hemoglobin A1C No results for input(s): HGBA1C in the last 72 hours. Fasting Lipid Panel No results for input(s): CHOL, HDL, LDLCALC, TRIG, CHOLHDL, LDLDIRECT in the last 72 hours. Thyroid Function Tests No results for input(s): TSH, T4TOTAL, T3FREE, THYROIDAB in the last 72 hours.  Invalid input(s): FREET3  Other results:   Imaging    No results found.   Medications:     Scheduled Medications: . aspirin  81 mg Oral Daily  . bisacodyl  10 mg Oral  Daily   Or  . bisacodyl  10 mg Rectal Daily  . chlorhexidine gluconate (MEDLINE KIT)  15 mL Mouth Rinse BID  . Chlorhexidine Gluconate Cloth  6 each Topical Daily  . enoxaparin (LOVENOX) injection  40 mg Subcutaneous Q24H  . feeding supplement (PRO-STAT SUGAR FREE 64)  30 mL Oral BID  . fentaNYL (SUBLIMAZE) injection  50 mcg Intravenous Once  . insulin aspart  0-24 Units Subcutaneous Q4H  . mouth rinse  15 mL Mouth Rinse 10 times per day  . pantoprazole sodium  40 mg Per Tube Daily  . sodium chloride flush  10-40 mL Intracatheter Q12H  . sodium chloride flush  3 mL Intravenous Q12H  . ticagrelor  90 mg Per Tube BID    Infusions: . sodium chloride Stopped (04/17/17 2130)  . sodium chloride    . sodium chloride 10 mL/hr at 04/19/17 0600  . sodium chloride    . amiodarone 30 mg/hr (04/19/17 0600)  . dexmedetomidine (PRECEDEX) IV infusion 1.198 mcg/kg/hr (04/19/17 0600)  . epinephrine Stopped (04/17/17 0615)  . feeding supplement (VITAL HIGH PROTEIN) 1,000 mL (04/19/17 0600)  . fentaNYL infusion INTRAVENOUS 400 mcg/hr (04/19/17 0600)  . furosemide (LASIX) infusion 10 mg/hr (04/19/17 0600)  . lactated ringers    . lactated ringers 10 mL/hr at 04/19/17 0600  . lactated ringers Stopped (04/17/17 2133)  . meropenem (MERREM) IV Stopped (04/19/17 0546)  . nitroGLYCERIN Stopped (04/12/17 2200)  . norepinephrine (LEVOPHED) Adult infusion 6 mcg/min (04/19/17 0600)  . potassium chloride 10 mEq (04/19/17 0659)  . vancomycin Stopped (04/18/17 2300)    PRN Medications: sodium chloride, acetaminophen (TYLENOL) oral liquid 160 mg/5 mL, albuterol, fentaNYL, Gerhardt's butt cream, lactated ringers, midazolam, ondansetron (ZOFRAN) IV, potassium chloride, sodium chloride flush, sodium chloride flush    Patient Profile   [name] is 66 year old with h/o MI, HTN, COPD admitted with chest pain. EKG with evidence of inferior/lateral STEMI. Due to lethargy and hypoxemia he was intubated in the ED.   Taken to cath lab with RCA DES and POBA to chronically occluded LAD with IABP placed.   Had LV pseudo aneurysm repair 11/14 .   Assessment/Plan   1. CAD with OOH anterior MI c/b LV pseudoaneurysm and cardiogenic shock - attempted PCI of LAD failed due to chronic occlusion and extensive infarct - s/p PCI/DES to distal RCA. Finished cangrelor. Now on brilinta. P2Y12 inhibited - No change.   2.  S/P LV pseudoaneurysm repair 11/14 - Chest closed 04/13/17. Stable.   3. Acute systolic HF-> cardiogenic shock - Remains  on Impella support. EF ~25% - Impella pulled 04/17/17 with concerns for clot on end of pump.  - RV moderately HK on echo - Swan numbers as above.  - CXR with continued vascular congestion this am. CVP 10-11. - Pressures much improved, into 140s today off of ART line. OFF pressor support. Will use BP for diuresis at this time. Will try to add low dose HF meds in the coming days.  - Avoid BB for now with recent need for pressors.   4. Acute Hypoxic Respiratory Failure  - Intubated 11/13. Weaning as tolerated now that impella is out.  - Volume status remains elevated. CVP 10-11.  - Will cut back at lasix to 8 mg/hr for now.  - Will give diamox 250 mg BID today and follow.    5. AKI  - Due to ATN shock  - Resolved.  6. COPD - Stable. No wheeze.   7. Thrombocytopenia - Improved to 264 k. HIT panel borderline.   8. Hypokalemia - K 3.0. Will supp aggressively today with continued diuresis.   9. Fever - Tmax 100.6. WBC 13.8 -> 12.5.  - Day 5 vanc.  Day 2 of meropenem.  - Cultures 04/15/17 ESBL from skin culture (penile meatus)  Length of Stay: [place], [place]  04/19/2017, 7:16 AM  Advanced Heart Failure Team Pager [phone] (M-F; 7a - 4p)   Please contact [facility] for night-coverage after hours (4p -7a ) and weekends on [email or web address]  .Agree  Remains intubated Had severe CO2 retention last night. Vent settings adjusted and improved. Swan  numbers ok on levophed 6. Will wean as tolerlated. CVP 10.  Continue to diurese gently  Remains on vent. Intubated. Responds to commands RIJ swan Cor RRR Lungs mild rhonchi Ab mildly distended. NT. Good BS Ext; warm 1-2+ edema  Remains tenuous but improving. Beginning vent wean. Will wean pressors as tolerated. Pull swan. Place PICC. Weight still up 15 pounds. Diurese gently. Continue meropenem for ESBL.   CRITICAL CARE Performed by: Glori Bickers  Total critical care time: 35 minutes  Critical care time was exclusive of separately billable procedures and treating other patients.  Critical care was necessary to treat or prevent imminent or life-threatening deterioration.  Critical care was time spent personally by me (independent of midlevel providers or residents) on the following activities: development of treatment plan with patient and/or surrogate as well as nursing, discussions with consultants, evaluation of patient's response to treatment, examination of patient, obtaining history from patient or surrogate, ordering and performing treatments and interventions, ordering and review of laboratory studies, ordering and review of radiographic studies, pulse oximetry and re-evaluation of patient's condition.  Glori Bickers, MD  5:00 PM

## 2017-04-19 NOTE — Progress Notes (Signed)
Patient ID: Ronald HoveGary Vanduyn, male   DOB: Oct 23, 1950, 66 y.o.   MRN: [id number] TCTS Evening Rounds:  Hemodynamically stable on levophed which is down to 2 mcg.  Awake and alert on vent on fentanyl drip and prn versed. Wean some today.  Diuresing on lasix drip  Amiodarone drip and AV paced at 80.  Tolerating tube feeds  BMET    Component Value Date/Time   NA 135 04/19/2017 1157   K 3.3 (L) 04/19/2017 1157   CL 85 (L) 04/19/2017 1157   CO2 40 (H) 04/19/2017 1157   GLUCOSE 139 (H) 04/19/2017 1157   BUN 29 (H) 04/19/2017 1157   CREATININE 1.13 04/19/2017 1157   CALCIUM 8.2 (L) 04/19/2017 1157   GFRNONAA >60 04/19/2017 1157   GFRAA >60 04/19/2017 1157   CBC    Component Value Date/Time   WBC 12.5 (H) 04/19/2017 0344   RBC 3.67 (L) 04/19/2017 0344   HGB 10.9 (L) 04/19/2017 1003   HCT 32.0 (L) 04/19/2017 1003   PLT 264 04/19/2017 0344   MCV 91.3 04/19/2017 0344   MCH 27.8 04/19/2017 0344   MCHC 30.4 04/19/2017 0344   RDW 14.5 04/19/2017 0344   LYMPHSABS 5.5 (H) 04/11/2017 2138   MONOABS 0.8 04/11/2017 2138   EOSABS 0.3 04/11/2017 2138   BASOSABS 0.1 04/11/2017 2138

## 2017-04-19 NOTE — Progress Notes (Addendum)
TCTS DAILY ICU PROGRESS NOTE                   [address]            [place] [address]          [phone]   2 Days Post-Op Procedure(s) (LRB): REMOVAL OF IMPELLA LEFT VENTRICULAR ASSIST DEVICE (N/A)  Total Length of Stay:  LOS: 8 days   Subjective:  Patient remains on vent.  Would follow some commands this morning.   Objective: Vital signs in last 24 hours: Temp:  [99.1 F (37.3 C)-100.6 F (38.1 C)] 99.9 F (37.7 C) (11/21 0700) Pulse Rate:  [60-80] 80 (11/21 0700) Cardiac Rhythm: Ventricular paced (11/20 2000) Resp:  [9-27] 11 (11/21 0700) BP: (94-169)/(48-95) 113/53 (11/21 0600) SpO2:  [91 %-100 %] 100 % (11/21 0700) Arterial Line BP: (93-219)/(27-72) 131/60 (11/21 0300) FiO2 (%):  [40 %] 40 % (11/21 0410) Weight:  [216 lb 14.9 oz (98.4 kg)] 216 lb 14.9 oz (98.4 kg) (11/21 0115)  Filed Weights   04/17/17 0400 04/18/17 0618 04/19/17 0115  Weight: 217 lb 13 oz (98.8 kg) 223 lb 1.7 oz (101.2 kg) 216 lb 14.9 oz (98.4 kg)    Weight change: -2.8 oz (-2.8 kg)   Hemodynamic parameters for last 24 hours: PAP: (34-61)/(10-28) 42/25 CVP:  [11 mmHg-25 mmHg] 18 mmHg  Intake/Output from previous day: 11/20 0701 - 11/21 0700 In: 4233.2 [I.V.:2783.2; NG/GT:1200; IV Piggyback:250] Out: 9750 [Urine:9350; Stool:400]  Current Meds: Scheduled Meds: . acetaZOLAMIDE  250 mg Per Tube BID  . aspirin  81 mg Per Tube Daily  . atorvastatin  80 mg Per Tube q1800  . bisacodyl  10 mg Oral Daily   Or  . bisacodyl  10 mg Rectal Daily  . chlorhexidine gluconate (MEDLINE KIT)  15 mL Mouth Rinse BID  . Chlorhexidine Gluconate Cloth  6 each Topical Daily  . enoxaparin (LOVENOX) injection  40 mg Subcutaneous Q24H  . feeding supplement (PRO-STAT SUGAR FREE 64)  30 mL Per Tube BID  . fentaNYL (SUBLIMAZE) injection  50 mcg Intravenous Once  . insulin aspart  0-24 Units Subcutaneous Q4H  . mouth rinse  15 mL Mouth Rinse 10 times per day  . pantoprazole sodium  40 mg Per  Tube Daily  . potassium chloride  40 mEq Per Tube TID  . sodium chloride flush  10-40 mL Intracatheter Q12H  . sodium chloride flush  3 mL Intravenous Q12H  . ticagrelor  90 mg Per Tube BID   Continuous Infusions: . sodium chloride Stopped (04/17/17 2130)  . sodium chloride    . sodium chloride 10 mL/hr at 04/19/17 0600  . sodium chloride    . amiodarone 30 mg/hr (04/19/17 0600)  . dexmedetomidine (PRECEDEX) IV infusion 1.2 mcg/kg/hr (04/19/17 0743)  . epinephrine Stopped (04/17/17 0615)  . feeding supplement (VITAL HIGH PROTEIN) 1,000 mL (04/19/17 0600)  . fentaNYL infusion INTRAVENOUS 400 mcg/hr (04/19/17 0600)  . furosemide (LASIX) infusion 10 mg/hr (04/19/17 0600)  . lactated ringers    . lactated ringers 10 mL/hr at 04/19/17 0600  . lactated ringers Stopped (04/17/17 2133)  . magnesium sulfate 1 - 4 g bolus IVPB 2 g (04/19/17 0757)  . meropenem (MERREM) IV Stopped (04/19/17 0546)  . nitroGLYCERIN Stopped (04/12/17 2200)  . norepinephrine (LEVOPHED) Adult infusion 6 mcg/min (04/19/17 0600)  . potassium chloride 10 mEq (04/19/17 0812)  . vancomycin Stopped (04/18/17 2300)   PRN Meds:.sodium chloride, acetaminophen (TYLENOL) oral  liquid 160 mg/5 mL, albuterol, fentaNYL, Jarvis Knodel's butt cream, lactated ringers, midazolam, ondansetron (ZOFRAN) IV, sodium chloride flush, sodium chloride flush  General appearance: on vent, followed some commands Heart: regular rate and rhythm Lungs: diminished breath sounds bibasilar Abdomen: soft, non-tender; bowel sounds normal; no masses,  no organomegaly Extremities: edema 1-2+  Wound: aquacel in place  Lab Results: CBC: Recent Labs    04/18/17 0445 04/19/17 0344 04/19/17 0400  WBC 13.8* 12.5*  --   HGB 9.7* 10.2* 10.5*  HCT 32.5* 33.5* 31.0*  PLT 144* 264  --    BMET:  Recent Labs    04/18/17 0445 04/19/17 0344 04/19/17 0400  NA 134* 135 135  K 3.8 2.9* 3.0*  CL 94* 86* 83*  CO2 32 38*  --   GLUCOSE 146* 136* 143*  BUN 18  26* 28*  CREATININE 1.03 1.11 1.10  CALCIUM 7.6* 7.9*  --     CMET: Lab Results  Component Value Date   WBC 12.5 (H) 04/19/2017   HGB 10.5 (L) 04/19/2017   HCT 31.0 (L) 04/19/2017   PLT 264 04/19/2017   GLUCOSE 143 (H) 04/19/2017   CHOL 167 04/11/2017   TRIG 109 04/11/2017   HDL 27 (L) 04/11/2017   LDLCALC 118 (H) 04/11/2017   ALT 51 04/19/2017   AST 40 04/19/2017   NA 135 04/19/2017   K 3.0 (L) 04/19/2017   CL 83 (L) 04/19/2017   CREATININE 1.10 04/19/2017   BUN 28 (H) 04/19/2017   CO2 38 (H) 04/19/2017   INR 1.27 04/18/2017   PT/INR:  Recent Labs    04/18/17 0445  LABPROT 15.8*  INR 1.27   Radiology: No results found.   Assessment/Plan: S/P Procedure(s) (LRB): REMOVAL OF IMPELLA LEFT VENTRICULAR ASSIST DEVICE (N/A)  1. CV- S/P LV Pseudoaneurysm repair, acute systolic HF- impella out, on Amiodarone and Levophed... AHF managing 2. Pulm- remains on vent, weaning care per CCM 3. Renal- creatinine stable, remains on diuretics per AHF, hypokalemia, supplementation ordered 4. ID- ESBL, leukocytosis, fever- continue Vanc, Meripenem 5. DM- sugars controlled 6. Dispo- remains on vent weaning, care per CCM, Acute systolic HF-- per AHF, continue current care    Erin Barrett 04/19/2017 8:23 AM   Sedated on vent , does follow commands  marked respiratory alkalosis - + 20 with contraction alkalosis - k low replace and check mag now on 40 meq po tid  Cox 68 Lines in since surgery, place pic , d/c sg On tube feeding  Renal function stable  plts returned to normal I have seen and examined Richrd Sox and agree with the above assessment  and plan.  Grace Isaac MD Beeper [phone] Office [phone] 04/19/2017 9:24 AM

## 2017-04-19 NOTE — Progress Notes (Signed)
PULMONARY / CRITICAL CARE MEDICINE   Name: [name] MRN: [id number] DOB: 1950-06-07    ADMISSION DATE:  04/11/2017 CONSULTATION DATE:  04/11/17  REFERRING MD:  Eldridge Dace  CHIEF COMPLAINT:  STEMI  HISTORY OF PRESENT ILLNESS:  [name] is a 66 y.o. male with PMH of CAD and HTN.  He presented to [facility] ED 11/13 with sudden onset of SOB and chest pain.  He was found to have inferolateral STEMI and was taken urgently to cath lab.  Prior to cath lab, he was intubated; therefore, PCCM asked to see in consultation to assist with vent management.  In cath lab, he was found to have 100% stenosed mid LAD lesion that was balloon angioplastied with 25% residual stenosis, distal RCA lesion which had DES placed, Ost 1st diag lesion 50% stenosed, proximal ramus 70% stenosed.  IABP was also placed. Has had ongoing shock, pressor dependent.   SUBJECTIVE:   Impala out and now off pressors  VITAL SIGNS: BP 114/61   Pulse 81   Temp 100 F (37.8 C)   Resp (!) 23   Ht 5\' 7"  (1.702 m)   Wt 98.4 kg (216 lb 14.9 oz)   SpO2 97%   BMI 33.98 kg/m   HEMODYNAMICS: PAP: (33-61)/(10-28) 38/20 CVP:  [10 mmHg-25 mmHg] 14 mmHg  VENTILATOR SETTINGS: Vent Mode: PRVC FiO2 (%):  [40 %] 40 % Set Rate:  [15 bmp-19 bmp] 15 bmp Vt Set:  [450 mL] 450 mL PEEP:  [5 cmH20] 5 cmH20 Pressure Support:  [10 cmH20] 10 cmH20 Plateau Pressure:  [12 cmH20-26 cmH20] 22 cmH20  INTAKE / OUTPUT: I/O last 3 completed shifts: In: 6906.1 [I.V.:4256.1; NG/GT:1800; IV Piggyback:850] Out: 40981 [Urine:10115; Stool:500; Chest Tube:30]   PHYSICAL EXAMINATION: General: Adult male, acutely ill appearing, NAD  Neuro: Alert and interactive, agitated at times, moving all ext to command HEENT: [place]/AT. PERRL, EOM_I and MMM Cardiovascular: RRR, Nl S1/S2, -M/R/G. Lungs: Coarse BS diffusely Abdomen: BS x 4, soft, NT/ND.  Musculoskeletal: No gross deformities, no edema.  Skin: Pale, warm, no rashes.  LABS:  BMET Recent Labs  Lab  04/17/17 0504  04/18/17 0445 04/19/17 0344 04/19/17 0400  NA 134*   < > 134* 135 135  K 3.3*   < > 3.8 2.9* 3.0*  CL 95*   < > 94* 86* 83*  CO2 32  --  32 38*  --   BUN 12   < > 18 26* 28*  CREATININE 0.99   < > 1.03 1.11 1.10  GLUCOSE 130*   < > 146* 136* 143*   < > = values in this interval not displayed.   Electrolytes Recent Labs  Lab 04/12/17 1248 04/13/17 0348 04/13/17 1615  04/17/17 0504 04/18/17 0445 04/19/17 0344  CALCIUM  --  8.9  --    < > 7.7* 7.6* 7.9*  MG 1.6* 2.3 1.8  --   --   --  1.9  PHOS 2.8  --   --   --   --   --  2.3*   < > = values in this interval not displayed.   CBC Recent Labs  Lab 04/17/17 0504  04/18/17 0445 04/19/17 0344 04/19/17 0400  WBC 11.9*  --  13.8* 12.5*  --   HGB 10.0*   < > 9.7* 10.2* 10.5*  HCT 31.9*   < > 32.5* 33.5* 31.0*  PLT 102*  --  144* 264  --    < > = values in this interval  not displayed.   Coag's Recent Labs  Lab 04/12/17 2147  04/17/17 0504 04/17/17 1650 04/18/17 0445  APTT 41*   < > 68* 44* 37*  INR 0.99  --   --   --  1.27   < > = values in this interval not displayed.   Sepsis Markers Recent Labs  Lab 04/12/17 1248 04/12/17 1254 04/13/17 0832 04/13/17 1207  LATICACIDVEN  --  0.9 2.4* 1.6  PROCALCITON 1.16  --   --   --    ABG Recent Labs  Lab 04/18/17 2039 04/19/17 0007 04/19/17 0356  PHART 7.407 7.414 7.444  PCO2ART 75.1* 69.6* 69.2*  PO2ART 105.0 106.0 94.0   Liver Enzymes Recent Labs  Lab 04/16/17 0343 04/17/17 0504 04/19/17 0344  AST 43* 33 40  ALT 25 27 51  ALKPHOS 49 57 59  BILITOT 1.4* 1.0 0.9  ALBUMIN 2.4* 2.4* 2.4*   Cardiac Enzymes Recent Labs  Lab 04/12/17 1248  TROPONINI 0.29*   Glucose Recent Labs  Lab 04/18/17 1214 04/18/17 1605 04/18/17 2005 04/18/17 2349 04/19/17 0443 04/19/17 0739  GLUCAP 144* 80 121* 127* 120* 139*    Imaging Dg Chest Port 1 View  Result Date: 04/19/2017 CLINICAL DATA:  Check endotracheal tube placement EXAM: PORTABLE  CHEST 1 VIEW COMPARISON:  04/18/2017 FINDINGS: Postsurgical changes are again noted. Swan-Ganz catheter is again seen and stable. Endotracheal tube and feeding catheter are again noted and stable. Right jugular central line is noted in the mid superior vena cava. Cardiac shadow remains enlarged. Bilateral pleural effusions are seen better visualized on the current exam. Bibasilar atelectatic changes are noted. Central vascular congestion is noted as well. IMPRESSION: Increasing pleural effusions and vascular congestion when compare with the prior exam. Electronically Signed   By: Alcide CleverMark  Lukens M.D.   On: 04/19/2017 08:23   STUDIES:  CXR 11/13 > possible PNA. Echo TEE 11/16 >     Right ventricle: Prior to chest closure, the RV size and systolic function appeared adequate. Following chest closure there was some reduction RV systolic function, but there was no RV enlargement or septal shift.  Mitral valve: The mitral valve leaflets were of normal thickness. There was good leaflet separation. There was normal leaflet coptation without prolapsing or flail leaflet segments. There was trace mitral insufficiency. The impella cannula did not interfere with the mitral valve apparatus.  Tricuspid valve: Trace regurgitation. The tricuspid valve regurgitation jet is central.   CULTURES: Blood 11/13 >  Sputum 11/13 >   ANTIBIOTICS: Ceftriaxone 11/13 > 11/17 Azithromycin 11/13 > 11/17 vanc 11/17>>> Zosyn 11/17>>>  SIGNIFICANT EVENTS: 11/13 > admit.  LINES/TUBES: ETT 11/13 >  R femoral sheath 11/13 >  R IJ CVL (bensimohn) 11/14>>>  DISCUSSION: 66 y.o. male admitted 11/13 with inferolateral STEMI.  He was intubated and was then taken to cath lab where he was found to have 100% stenosed mid LAD lesion that was balloon angioplastied with 25% residual stenosis, distal RCA lesion which had DES placed, Ost 1st diag lesion 50% stenosed, proximal ramus 70% stenosed.  IABP was also placed. On f/u echo had ?MV  rupture - taken urgently to OR.  Had LV pseudoaneurysm rupture, repaired, impala placed.   ASSESSMENT / PLAN:  PULMONARY A: Acute respiratory failure - r/t STEMI ? PNA - bilateral patchiness - ?aspiration v edema  P:   Begin PS trials, no extubation today given fluid status F/u CXR  F/u ABG KVO-IVF Needs aggressive diureses Abx as below  CARDIOVASCULAR A:  Inferolateral STEMI - s/p cath lab where he was found to have 100% stenosed mid LAD lesion that was balloon angioplastied with 25% residual stenosis, distal RCA lesion which had DES placed, Ost 1st diag lesion 50% stenosed, proximal ramus 70% stenosed.  IABP was also placed. LV pseudoaneurysm rupture - to OR emergently 11/14, chest closure 11/16 Hypotension - suspect sedation related (was normotensive on arrival to ICU but was somewhat agitated so got additional sedation and later became hypotensive). Hx HTN, CAD. P:  Cardiology/ CVTS managing. Epi off, weaning levophed as able Follow CVPs  Diuresing well on lasix gtt as below Added zaroxolyn 10 mg PO Continue amiodarone  Anticoagulation per cards - bivalirudin, cangrelor   RENAL A:   NAGMA. AKI - improving.  Good UOP P:   Lasix drip decreased to 5 mg/hr Zaroxolyn 10 mg PO x1 BMP in AM. Replace electrolytes as needed   GASTROINTESTINAL A:   GI prophylaxis. Nutrition. P:   SUP: Pantoprazole. TF per nutrition  HEMATOLOGIC A:   VTE Prophylaxis. P:  Anticoagulation as above  CBC in AM.  INFECTIOUS A:   Possible PNA. P:   Broad spectrum abx as above  Follow cultures - neg to date  Trend wbc, fever curve   ENDOCRINE A:   No acute issues. P:   Monitor glucose on chem   NEUROLOGIC A:   Sedation due to mechanical ventilation. P:   Sedation:  Precedex, fentanyl, PRN versed  RASS goal: 0 to -1. Daily WUA. Will increase precedex ceiling to 1.2  Family updated: No family bedside 11/21.  Continue diureses, PS trials, minimize sedation as  able.  Interdisciplinary Family Meeting v Palliative Care Meeting:  Due by: 04/17/17.  The patient is critically ill with multiple organ systems failure and requires high complexity decision making for assessment and support, frequent evaluation and titration of therapies, application of advanced monitoring technologies and extensive interpretation of multiple databases.   Critical Care Time devoted to patient care services described in this note is  35  Minutes. This time reflects time of care of this signee Dr Koren BoundWesam Larinda Herter. This critical care time does not reflect procedure time, or teaching time or supervisory time of PA/NP/Med student/Med Resident etc but could involve care discussion time.  Alyson ReedyWesam G. Tally Mckinnon, M.D. [facility]/Critical Care Medicine. Pager: [phone]. After hours pager: [phone].

## 2017-04-20 ENCOUNTER — Inpatient Hospital Stay (HOSPITAL_COMMUNITY): Payer: Medicare Other

## 2017-04-20 DIAGNOSIS — R570 Cardiogenic shock: Secondary | ICD-10-CM

## 2017-04-20 LAB — CBC
HCT: 34.7 % — ABNORMAL LOW (ref 39.0–52.0)
Hemoglobin: 10.8 g/dL — ABNORMAL LOW (ref 13.0–17.0)
MCH: 27.9 pg (ref 26.0–34.0)
MCHC: 31.1 g/dL (ref 30.0–36.0)
MCV: 89.7 fL (ref 78.0–100.0)
Platelets: 375 10*3/uL (ref 150–400)
RBC: 3.87 MIL/uL — ABNORMAL LOW (ref 4.22–5.81)
RDW: 15 % (ref 11.5–15.5)
WBC: 13.1 10*3/uL — ABNORMAL HIGH (ref 4.0–10.5)

## 2017-04-20 LAB — POCT I-STAT, CHEM 8
BUN: 30 mg/dL — ABNORMAL HIGH (ref 6–20)
BUN: 30 mg/dL — ABNORMAL HIGH (ref 6–20)
CALCIUM ION: 1.07 mmol/L — AB (ref 1.15–1.40)
CHLORIDE: 80 mmol/L — AB (ref 101–111)
Calcium, Ion: 1.04 mmol/L — ABNORMAL LOW (ref 1.15–1.40)
Chloride: 81 mmol/L — ABNORMAL LOW (ref 101–111)
Creatinine, Ser: 1.2 mg/dL (ref 0.61–1.24)
Creatinine, Ser: 1.1 mg/dL (ref 0.61–1.24)
GLUCOSE: 137 mg/dL — AB (ref 65–99)
Glucose, Bld: 141 mg/dL — ABNORMAL HIGH (ref 65–99)
HCT: 33 % — ABNORMAL LOW (ref 39.0–52.0)
HEMATOCRIT: 33 % — AB (ref 39.0–52.0)
HEMOGLOBIN: 11.2 g/dL — AB (ref 13.0–17.0)
Hemoglobin: 11.2 g/dL — ABNORMAL LOW (ref 13.0–17.0)
POTASSIUM: 3.1 mmol/L — AB (ref 3.5–5.1)
POTASSIUM: 3 mmol/L — AB (ref 3.5–5.1)
SODIUM: 133 mmol/L — AB (ref 135–145)
SODIUM: 133 mmol/L — AB (ref 135–145)
TCO2: 39 mmol/L — ABNORMAL HIGH (ref 22–32)
TCO2: 43 mmol/L — ABNORMAL HIGH (ref 22–32)

## 2017-04-20 LAB — COOXEMETRY PANEL
Carboxyhemoglobin: 1.5 % (ref 0.5–1.5)
Methemoglobin: 0.8 % (ref 0.0–1.5)
O2 Saturation: 60 %
Total hemoglobin: 11.1 g/dL — ABNORMAL LOW (ref 12.0–16.0)

## 2017-04-20 LAB — CULTURE, BLOOD (ROUTINE X 2)
CULTURE: NO GROWTH
Culture: NO GROWTH
SPECIAL REQUESTS: ADEQUATE
Special Requests: ADEQUATE

## 2017-04-20 LAB — GLUCOSE, CAPILLARY
GLUCOSE-CAPILLARY: 116 mg/dL — AB (ref 65–99)
GLUCOSE-CAPILLARY: 130 mg/dL — AB (ref 65–99)
Glucose-Capillary: 140 mg/dL — ABNORMAL HIGH (ref 65–99)
Glucose-Capillary: 136 mg/dL — ABNORMAL HIGH (ref 65–99)
Glucose-Capillary: 136 mg/dL — ABNORMAL HIGH (ref 65–99)
Glucose-Capillary: 156 mg/dL — ABNORMAL HIGH (ref 65–99)

## 2017-04-20 LAB — POCT I-STAT 3, ART BLOOD GAS (G3+)
Acid-Base Excess: 20 mmol/L — ABNORMAL HIGH (ref 0.0–2.0)
Bicarbonate: 46.3 mmol/L — ABNORMAL HIGH (ref 20.0–28.0)
O2 Saturation: 96 %
Patient temperature: 37.6
TCO2: 48 mmol/L — AB (ref 22–32)
pCO2 arterial: 63.9 mmHg — ABNORMAL HIGH (ref 32.0–48.0)
pH, Arterial: 7.471 — ABNORMAL HIGH (ref 7.350–7.450)
pO2, Arterial: 80 mmHg — ABNORMAL LOW (ref 83.0–108.0)

## 2017-04-20 LAB — BASIC METABOLIC PANEL
Anion gap: 11 (ref 5–15)
BUN: 30 mg/dL — ABNORMAL HIGH (ref 6–20)
CO2: 38 mmol/L — ABNORMAL HIGH (ref 22–32)
Calcium: 8.1 mg/dL — ABNORMAL LOW (ref 8.9–10.3)
Chloride: 84 mmol/L — ABNORMAL LOW (ref 101–111)
Creatinine, Ser: 1.05 mg/dL (ref 0.61–1.24)
GFR calc Af Amer: >60 mL/min (ref >60)
GFR calc non Af Amer: >60 mL/min (ref >60)
Glucose, Bld: 140 mg/dL — ABNORMAL HIGH (ref 65–99)
Potassium: 3.2 mmol/L — ABNORMAL LOW (ref 3.5–5.1)
Sodium: 133 mmol/L — ABNORMAL LOW (ref 135–145)

## 2017-04-20 LAB — MAGNESIUM: Magnesium: 2.1 mg/dL (ref 1.7–2.4)

## 2017-04-20 LAB — PHOSPHORUS: Phosphorus: 3.5 mg/dL (ref 2.5–4.6)

## 2017-04-20 MED ORDER — POTASSIUM CHLORIDE 20 MEQ/15ML (10%) PO SOLN
40.0000 meq | Freq: Two times a day (BID) | ORAL | Status: DC
  Administered 2017-04-20 – 2017-04-21 (×2): 40 meq
  Filled 2017-04-20 (×2): qty 30

## 2017-04-20 MED ORDER — POTASSIUM CHLORIDE 10 MEQ/50ML IV SOLN
10.0000 meq | INTRAVENOUS | Status: AC
  Administered 2017-04-20 (×3): 10 meq via INTRAVENOUS
  Filled 2017-04-20: qty 50

## 2017-04-20 MED ORDER — POTASSIUM CHLORIDE 20 MEQ/15ML (10%) PO SOLN
40.0000 meq | Freq: Once | ORAL | Status: AC
  Administered 2017-04-20: 40 meq
  Filled 2017-04-20: qty 30

## 2017-04-20 MED ORDER — POTASSIUM CHLORIDE CRYS ER 20 MEQ PO TBCR: 40.0000 meq | EXTENDED_RELEASE_TABLET | Freq: Once | ORAL | Status: DC

## 2017-04-20 MED ORDER — POTASSIUM CHLORIDE 10 MEQ/50ML IV SOLN
10.0000 meq | INTRAVENOUS | Status: AC
  Administered 2017-04-20 (×6): 10 meq via INTRAVENOUS
  Filled 2017-04-20 (×6): qty 50

## 2017-04-20 MED ORDER — POTASSIUM CHLORIDE 10 MEQ/50ML IV SOLN: 10.0000 meq | INTRAVENOUS | Status: DC

## 2017-04-20 NOTE — Progress Notes (Signed)
TCTS BRIEF SICU PROGRESS NOTE  3 Days Post-Op  S/P Procedure(s) (LRB): REMOVAL OF IMPELLA LEFT VENTRICULAR ASSIST DEVICE (N/A)   Stable day Levophed down to 2 mcg/min O2  sats 98% on 40% FiO2 Diuresing well  Plan: Continue current plan  Purcell Nailslarence H Owen, MD 04/20/2017 6:11 PM

## 2017-04-20 NOTE — Progress Notes (Signed)
PULMONARY / CRITICAL CARE MEDICINE   Name: [name] MRN: [id number] DOB: 10-Apr-1951    ADMISSION DATE:  04/11/2017 CONSULTATION DATE:  04/11/17  REFERRING MD:  Eldridge DaceVaranasi  CHIEF COMPLAINT:  STEMI  HISTORY OF PRESENT ILLNESS:  Ronald HoveGary Flicker is a 66 y.o. male with PMH of CAD and HTN.  [facility] ED [facility]13 with sudden onset of SOB and chest pain.  He was found to have inferolateral STEMI and was taken urgently to cath lab.  Prior to cath lab, he was intubated; therefore, PCCM asked to see in consultation to assist with vent management.  In cath lab, he was found to have 100% stenosed mid LAD lesion that was balloon angioplastied with 25% residual stenosis, distal RCA lesion which had DES placed, Ost 1st diag lesion 50% stenosed, proximal ramus 70% stenosed.  IABP was also placed. Has had ongoing shock, pressor dependent.   SUBJECTIVE:   RN reports issues with agitation VITAL SIGNS: BP (!) 108/47   Pulse 68   Temp 100.2 F (37.9 C)   Resp 17   Ht 5\' 7"  (1.702 m)   Wt 211 lb 10.3 oz (96 kg)   SpO2 97%   BMI 33.15 kg/m   HEMODYNAMICS: PAP: (33-48)/(12-23) 36/15 CVP:  [2 mmHg-22 mmHg] 6 mmHg  VENTILATOR SETTINGS: Vent Mode: PSV;CPAP FiO2 (%):  [40 %] 40 % Set Rate:  [15 bmp] 15 bmp Vt Set:  [450 mL] 450 mL PEEP:  [5 cmH20] 5 cmH20 Pressure Support:  [10 cmH20-14 cmH20] 10 cmH20 Plateau Pressure:  [22 cmH20-26 cmH20] 26 cmH20  INTAKE / OUTPUT: I/O last 3 completed shifts: In: 7191.3 [I.V.:4151.3; NG/GT:2290; IV Piggyback:750] Out: 1478212175 [Urine:11200; Emesis/NG output:500; Stool:475]   PHYSICAL EXAMINATION: General: Currently sedated HEENT: Full ventilatory support PSY: Sedated Neuro: Moves all extremities x4 CV: Sounds are regular currently being paced, external dressing dry and intact PULM: Decreased breath sounds in the bases NF:AOZHGI:soft, non-tender, bsx4 active  Extremities: warm/dry, mild edema  Skin: no rashes or lesions  LABS:  BMET Recent Labs  Lab  04/19/17 0344  04/19/17 1157 04/19/17 1852 04/20/17 0114 04/20/17 0420  NA 135   < > 135 133* 133* 133*  K 2.9*   < > 3.3* 3.1* 3.0* 3.2*  CL 86*   < > 85* 81* 80* 84*  CO2 38*  --  40*  --   --  38*  BUN 26*   < > 29* 30* 30* 30*  CREATININE 1.11   < > 1.13 1.20 1.10 1.05  GLUCOSE 136*   < > 139* 137* 141* 140*   < > = values in this interval not displayed.   Electrolytes Recent Labs  Lab 04/13/17 1615  04/19/17 0344 04/19/17 1157 04/20/17 0420  CALCIUM  --    < > 7.9* 8.2* 8.1*  MG 1.8  --  1.9  --  2.1  PHOS  --   --  2.3*  --  3.5   < > = values in this interval not displayed.   CBC Recent Labs  Lab 04/18/17 0445 04/19/17 0344  04/19/17 1852 04/20/17 0114 04/20/17 0420  WBC 13.8* 12.5*  --   --   --  13.1*  HGB 9.7* 10.2*   < > 11.2* 11.2* 10.8*  HCT 32.5* 33.5*   < > 33.0* 33.0* 34.7*  PLT 144* 264  --   --   --  375   < > = values in this interval not displayed.   Coag's  Recent Labs  Lab 04/17/17 0504 04/17/17 1650 04/18/17 0445  APTT 68* 44* 37*  INR  --   --  1.27   Sepsis Markers Recent Labs  Lab 04/13/17 1207  LATICACIDVEN 1.6   ABG Recent Labs  Lab 04/19/17 0356 04/19/17 1208 04/20/17 0444  PHART 7.444 7.550* 7.471*  PCO2ART 69.2* 51.4* 63.9*  PO2ART 94.0 59.0* 80.0*   Liver Enzymes Recent Labs  Lab 04/16/17 0343 04/17/17 0504 04/19/17 0344  AST 43* 33 40  ALT 25 27 51  ALKPHOS 49 57 59  BILITOT 1.4* 1.0 0.9  ALBUMIN 2.4* 2.4* 2.4*   Cardiac Enzymes No results for input(s): TROPONINI, PROBNP in the last 168 hours. Glucose Recent Labs  Lab 04/19/17 1119 04/19/17 1534 04/19/17 1935 04/19/17 2327 04/20/17 0320 04/20/17 0828  GLUCAP 140* 143* 110* 140* 116* 140*    Imaging Dg Chest Port 1 View  Result Date: 04/20/2017 CLINICAL DATA:  STEMI.  COPD.  CHF. EXAM: PORTABLE CHEST 1 VIEW COMPARISON:  04/19/2017 FINDINGS: Endotracheal tube, feeding tube, right jugular central venous catheter, and right PICC are stable.  Left jugular introducer and Swan-Ganz removed. NG tube placed. The tip is beyond the gastroesophageal junction. Stable vascular congestion and central basilar edema. Cardiomegaly. No pneumothorax. IMPRESSION: Stable CHF. Electronically Signed   By: Jolaine ClickArthur  Hoss M.D.   On: 04/20/2017 09:17   Dg Chest Port 1 View  Result Date: 04/19/2017 CLINICAL DATA:  Post PICC line placement EXAM: PORTABLE CHEST 1 VIEW COMPARISON:  Portable chest x-ray of April 19, 2017 FINDINGS: The patient has undergone placement of a PICC line via the right upper extremity. The tip of the catheter projects in the midportion of the SVC. The right internal jugular venous catheter tip projects just proximal to this. The lungs are adequately inflated. The interstitial markings remain increased in the pulmonary vascularity engorged. The retrocardiac region on the left remains dense. The cardiac silhouette remains enlarged. The Swan-Ganz catheter tip projects over the right main pulmonary artery. The endotracheal tube tip projects approximately 5.3 cm above the carina. The feeding tube tip projects below the inferior margin of the image. The mediastinal drain has been removed. IMPRESSION: No immediate complication following right-sided PICC line placement. Stable appearance of the chest otherwise. Electronically Signed   By: David  SwazilandJordan M.D.   On: 04/19/2017 14:51   STUDIES:  CXR 11/13 > possible PNA. Echo TEE 11/16 >     Right ventricle: Prior to chest closure, the RV size and systolic function appeared adequate. Following chest closure there was some reduction RV systolic function, but there was no RV enlargement or septal shift.  Mitral valve: The mitral valve leaflets were of normal thickness. There was good leaflet separation. There was normal leaflet coptation without prolapsing or flail leaflet segments. There was trace mitral insufficiency. The impella cannula did not interfere with the mitral valve apparatus.  Tricuspid  valve: Trace regurgitation. The tricuspid valve regurgitation jet is central.   CULTURES: Blood 11/13 > 102 with E. coli Sputum 11/13 > negative Wound 11/17>> ecoli  ANTIBIOTICS: Ceftriaxone 11/13 > 11/17 Azithromycin 11/13 > 11/17 vanc 11/17>>>11/20 Zosyn 11/17>>>11/20 Meropenem 04/18/2017>>  SIGNIFICANT EVENTS: 11/13 > admit.  With resection of left ventricular aneurysm and placement of 5.0 Impella 04/17/2017 removal of clotted impella  pump     LINES/TUBES: ETT 11/13 >  R femoral sheath 11/13 >  R IJ CVL (bensimohn) 11/14>>>  DISCUSSION: 66 y.o. male admitted 11/13 with inferolateral STEMI.  He was intubated and was  then taken to cath lab where he was found to have 100% stenosed mid LAD lesion that was balloon angioplastied with 25% residual stenosis, distal RCA lesion which had DES placed, Ost 1st diag lesion 50% stenosed, proximal ramus 70% stenosed.  IABP was also placed. On f/u echo had ?MV rupture - taken urgently to OR.  Had LV pseudoaneurysm rupture, repaired, impala placed.   ASSESSMENT / PLAN:  PULMONARY A: Acute respiratory failure - r/t STEMI ? PNA - bilateral patchiness - ?aspiration v edema  P:   Begin PS trials, no extubation today given fluid status F/u CXR  F/u ABG KVO-IVF Needs aggressive diureses Abx as below  CARDIOVASCULAR A:  Inferolateral STEMI - s/p cath lab where he was found to have 100% stenosed mid LAD lesion that was balloon angioplastied with 25% residual stenosis, distal RCA lesion which had DES placed, Ost 1st diag lesion 50% stenosed, proximal ramus 70% stenosed.  IABP was also placed. LV pseudoaneurysm rupture - to OR emergently 11/14, chest closure 11/16 Hypotension - suspect sedation related (was normotensive on arrival to ICU but was somewhat agitated so got additional sedation and later became hypotensive). Hx HTN, CAD. P:  Cardiology/ CVTS managing. Epi off, weaning levophed as able Follow CVPs  Diuresing well on lasix gtt  as below Added zaroxolyn 10 mg PO Continue amiodarone  Anticoagulation per cards - bivalirudin, cangrelor   RENAL A:   NAGMA. AKI - improving.  Good UOP P:   Lasix drip decreased to 5 mg/hr Zaroxolyn 10 mg PO x1 BMP in AM. Replace electrolytes as needed   GASTROINTESTINAL A:   GI prophylaxis. Nutrition. P:   SUP: Pantoprazole. TF per nutrition  HEMATOLOGIC A:   VTE Prophylaxis. P:  Anticoagulation as above  CBC in AM.  INFECTIOUS A:   Possible PNA. P:   Broad spectrum abx as above  Follow cultures - neg to date  Trend wbc, fever curve   ENDOCRINE A:   No acute issues. P:   Monitor glucose on chem   NEUROLOGIC A:   Sedation due to mechanical ventilation. P:   Sedation:  Precedex, fentanyl, PRN versed  RASS goal: 0 to -1. Daily WUA. Will increase precedex ceiling to 1.2 on 04/19/2017 Use as needed Versed  Family updated: No family bedside 11/21.  Continue diureses, PS trials, minimize sedation as able.  Interdisciplinary Family Meeting v Palliative Care Meeting:  Due by: 04/17/17.  cct 30 min  Brett Canales [place] ACNP Adolph Pollack PCCM Pager [phone] till 1 pm If no answer page [phone] 04/20/2017, 11:02 AM

## 2017-04-20 NOTE — Progress Notes (Addendum)
Advanced Heart Failure Rounding Note   Subjective:    - S/P LV pseudoaneurysm repeat 11/14 Impella 5.0 Placed.  - Chest closed 11/16.  - Started on broad spectrum abx on 11/17 for fever and leukocytosis.  - Impella noted to have poor waveform am of 11/19. Thought to have possible clot.  Heparin restarted and pump pulled afternoon of 04/17/17.  Remains on vent. Awake but agitated at time. On precedex and fentanyl. Getting versed prn  On NE 6. BPs 100-120s. Diuresing well on lasix 5. Weight nearing baseline.   On meropenem/vanc for ESBL on fluid culture  Objective:   Weight Range: 96 kg (211 lb 10.3 oz) Body mass index is 33.15 kg/m.   Vital Signs:   Temp:  [99.1 F (37.3 C)-101.1 F (38.4 C)] 101.1 F (38.4 C) (11/22 0817) Pulse Rate:  [43-91] 88 (11/22 0817) Resp:  [0-32] 21 (11/22 0817) BP: (62-126)/(42-93) 101/86 (11/22 0817) SpO2:  [95 %-100 %] 97 % (11/22 0817) Arterial Line BP: (70-153)/(38-63) 126/60 (11/22 0700) FiO2 (%):  [40 %] 40 % (11/22 0400) Weight:  [96 kg (211 lb 10.3 oz)] 96 kg (211 lb 10.3 oz) (11/22 0200) Last BM Date: 04/19/17  Weight change: Filed Weights   04/18/17 0618 04/19/17 0115 04/20/17 0200  Weight: 101.2 kg (223 lb 1.7 oz) 98.4 kg (216 lb 14.9 oz) 96 kg (211 lb 10.3 oz)   Intake/Output:   Intake/Output Summary (Last 24 hours) at 04/20/2017 0827 Last data filed at 04/20/2017 0700 Gross per 24 hour  Intake 4683.39 ml  Output 6025 ml  Net -1341.61 ml    Physical Exam   CVP 8  General:  Awake on vent. Agitated at times  HEENT: normal + ETT and NGT Neck: supple. JVP 8-9. Carotids 2+ bilat; no bruits. No lymphadenopathy or thryomegaly appreciated. Cor: PMI nondisplaced. Regular rate & rhythm. Sternal dressing ok  Lungs: clear with mechanical BS Abdomen: soft, nontender, + mildly distended (improved) No hepatosplenomegaly. No bruits or masses. Good bowel sounds. Extremities: no cyanosis, clubbing, rash, trace-1+ edema Neuro:  awake on vent.agitated at tinmes   Telemetry   NSR 70-80s, personally reviewed.   EKG   N/A  Labs    CBC Recent Labs    04/19/17 0344  04/20/17 0114 04/20/17 0420  WBC 12.5*  --   --  13.1*  HGB 10.2*   < > 11.2* 10.8*  HCT 33.5*   < > 33.0* 34.7*  MCV 91.3  --   --  89.7  PLT 264  --   --  375   < > = values in this interval not displayed.   Basic Metabolic Panel Recent Labs    04/19/17 0344  04/19/17 1157  04/20/17 0114 04/20/17 0420  NA 135   < > 135   < > 133* 133*  K 2.9*   < > 3.3*   < > 3.0* 3.2*  CL 86*   < > 85*   < > 80* 84*  CO2 38*  --  40*  --   --  38*  GLUCOSE 136*   < > 139*   < > 141* 140*  BUN 26*   < > 29*   < > 30* 30*  CREATININE 1.11   < > 1.13   < > 1.10 1.05  CALCIUM 7.9*  --  8.2*  --   --  8.1*  MG 1.9  --   --   --   --  2.1  PHOS 2.3*  --   --   --   --  3.5   < > = values in this interval not displayed.   Liver Function Tests Recent Labs    04/19/17 0344  AST 40  ALT 51  ALKPHOS 59  BILITOT 0.9  PROT 5.6*  ALBUMIN 2.4*   No results for input(s): LIPASE, AMYLASE in the last 72 hours. Cardiac Enzymes No results for input(s): CKTOTAL, CKMB, CKMBINDEX, TROPONINI in the last 72 hours.  BNP: BNP (last 3 results) No results for input(s): BNP in the last 8760 hours.  ProBNP (last 3 results) No results for input(s): PROBNP in the last 8760 hours.   D-Dimer No results for input(s): DDIMER in the last 72 hours. Hemoglobin A1C No results for input(s): HGBA1C in the last 72 hours. Fasting Lipid Panel No results for input(s): CHOL, HDL, LDLCALC, TRIG, CHOLHDL, LDLDIRECT in the last 72 hours. Thyroid Function Tests No results for input(s): TSH, T4TOTAL, T3FREE, THYROIDAB in the last 72 hours.  Invalid input(s): FREET3  Other results:   Imaging    Dg Chest Port 1 View  Result Date: 04/19/2017 CLINICAL DATA:  Post PICC line placement EXAM: PORTABLE CHEST 1 VIEW COMPARISON:  Portable chest x-ray of April 19, 2017  FINDINGS: The patient has undergone placement of a PICC line via the right upper extremity. The tip of the catheter projects in the midportion of the SVC. The right internal jugular venous catheter tip projects just proximal to this. The lungs are adequately inflated. The interstitial markings remain increased in the pulmonary vascularity engorged. The retrocardiac region on the left remains dense. The cardiac silhouette remains enlarged. The Swan-Ganz catheter tip projects over the right main pulmonary artery. The endotracheal tube tip projects approximately 5.3 cm above the carina. The feeding tube tip projects below the inferior margin of the image. The mediastinal drain has been removed. IMPRESSION: No immediate complication following right-sided PICC line placement. Stable appearance of the chest otherwise. Electronically Signed   By: [name] M.D.   On: 04/19/2017 14:51     Medications:     Scheduled Medications: . aspirin  81 mg Per Tube Daily  . atorvastatin  80 mg Per Tube q1800  . bisacodyl  10 mg Oral Daily   Or  . bisacodyl  10 mg Rectal Daily  . chlorhexidine gluconate (MEDLINE KIT)  15 mL Mouth Rinse BID  . Chlorhexidine Gluconate Cloth  6 each Topical Daily  . enoxaparin (LOVENOX) injection  40 mg Subcutaneous Q24H  . feeding supplement (PRO-STAT SUGAR FREE 64)  30 mL Per Tube BID  . fentaNYL (SUBLIMAZE) injection  50 mcg Intravenous Once  . insulin aspart  0-24 Units Subcutaneous Q4H  . mouth rinse  15 mL Mouth Rinse 10 times per day  . pantoprazole sodium  40 mg Per Tube Daily  . sodium chloride flush  10-40 mL Intracatheter Q12H  . sodium chloride flush  10-40 mL Intracatheter Q12H  . sodium chloride flush  3 mL Intravenous Q12H  . ticagrelor  90 mg Per Tube BID    Infusions: . sodium chloride Stopped (04/17/17 2130)  . sodium chloride    . sodium chloride 10 mL/hr at 04/20/17 0700  . sodium chloride    . amiodarone 30 mg/hr (04/20/17 0700)  . dexmedetomidine  (PRECEDEX) IV infusion 1.2 mcg/kg/hr (04/20/17 0700)  . epinephrine Stopped (04/17/17 0615)  . feeding supplement (VITAL HIGH PROTEIN) 1,000 mL (04/20/17 0700)  . fentaNYL infusion INTRAVENOUS 400  mcg/hr (04/20/17 0700)  . furosemide (LASIX) infusion 5 mg/hr (04/20/17 0700)  . lactated ringers    . lactated ringers 10 mL/hr at 04/20/17 0700  . lactated ringers Stopped (04/17/17 2133)  . meropenem (MERREM) IV Stopped (04/20/17 0559)  . nitroGLYCERIN Stopped (04/12/17 2200)  . norepinephrine (LEVOPHED) Adult infusion 6 mcg/min (04/20/17 0703)    PRN Medications: sodium chloride, acetaminophen (TYLENOL) oral liquid 160 mg/5 mL, albuterol, fentaNYL, Gerhardt's butt cream, lactated ringers, midazolam, ondansetron (ZOFRAN) IV, sodium chloride flush, sodium chloride flush, sodium chloride flush    Patient Profile   [name] is 66 year old with h/o MI, HTN, COPD admitted with chest pain. EKG with evidence of inferior/lateral STEMI. Due to lethargy and hypoxemia he was intubated in the ED.  Taken to cath lab with RCA DES and POBA to chronically occluded LAD with IABP placed.   Had LV pseudo aneurysm repair 11/14 .   Assessment/Plan   1. CAD with OOH anterior MI c/b LV pseudoaneurysm and cardiogenic shock - attempted PCI of LAD failed due to chronic occlusion and extensive infarct - s/p PCI/DES to distal RCA. Finished cangrelor. Now on brilinta. P2Y12 inhibited - stable no s/s ischemia  2.  S/P LV pseudoaneurysm repair 11/14 - Chest closed 04/13/17. Stable.   3. Acute systolic HF-> cardiogenic shock - Impella removed 04/17/17 - EF improved 25%->40% on recent intra-op TEE - Remains on NE 6 for BP support. Co-ox 60%. Wean as tolerated - Volume status much improved. Continue lasix gtt one more day. (also getting diamox per ccm) - Avoid overdiuresis  4. Acute Hypoxic Respiratory Failure  - Intubated 11/13.  - PCO2 remains in 60s but ABG compensated so this is likely baseline -  Continue vent wean per CCM. Diurese to keep lungs dry. CXR ok (viewed personally)  5. AKI  - Due to ATN shock  - Resolved.  6. COPD with chronic CO2 retention  - Stable. No wheeze.   7. Thrombocytopenia - Resolved  8. ESBL E coli culture -04/15/17 ESBL from skin culture (penile meatus) - Day 6 vanc.  Day 3 of meropenem.  - Can stop vanc. D/w PharmD  9. PAF - Maintaining NSR on amio gtt. Switch to po when extubated  CRITICAL CARE Performed by: [name]  Total critical care time: 35 minutes  Critical care time was exclusive of separately billable procedures and treating other patients.  Critical care was necessary to treat or prevent imminent or life-threatening deterioration.  Critical care was time spent personally by me (independent of midlevel providers or residents) on the following activities: development of treatment plan with patient and/or surrogate as well as nursing, discussions with consultants, evaluation of patient's response to treatment, examination of patient, obtaining history from patient or surrogate, ordering and performing treatments and interventions, ordering and review of laboratory studies, ordering and review of radiographic studies, pulse oximetry and re-evaluation of patient's condition.   Length of Stay: [place], MD  04/20/2017, 8:27 AM  Advanced Heart Failure Team Pager [phone] (M-F; 7a - 4p)   Please contact [facility] for night-coverage after hours (4p -7a ) and weekends on [email or web address]

## 2017-04-20 NOTE — Progress Notes (Signed)
[address]       [place] [id number]             [phone]        CARDIOTHORACIC SURGERY PROGRESS NOTE   R3 Days Post-Op Procedure(s) (LRB): REMOVAL OF IMPELLA LEFT VENTRICULAR ASSIST DEVICE (N/A)  Subjective: Sedated on vent  Objective: Vital signs: BP Readings from Last 1 Encounters:  04/20/17 99/61   Pulse Readings from Last 1 Encounters:  04/20/17 70   Resp Readings from Last 1 Encounters:  04/20/17 (!) 23   Temp Readings from Last 1 Encounters:  04/20/17 (!) 100.8 F (38.2 C)    Hemodynamics: PAP: (33-48)/(12-23) 36/15 CVP:  [2 mmHg-22 mmHg] 5 mmHg  Mixed venous co-ox 60%   Physical Exam:  Rhythm:   sinus  Breath sounds: clear  Heart sounds:  RRR  Incisions:  Dressing dry, intact  Abdomen:  Soft, non-distended  Extremities:  Warm, well-perfused    Intake/Output from previous day: 11/21 0701 - 11/22 0700 In: 4892.4 [I.V.:2502.4; NG/GT:1640; IV Piggyback:750] Out: 6025 [Urine:5450; Emesis/NG output:500; Stool:75] Intake/Output this shift: Total I/O In: 174.2 [I.V.:124.2; NG/GT:50] Out: 450 [Urine:450]  Lab Results:  CBC: Recent Labs    04/19/17 0344  04/20/17 0114 04/20/17 0420  WBC 12.5*  --   --  13.1*  HGB 10.2*   < > 11.2* 10.8*  HCT 33.5*   < > 33.0* 34.7*  PLT 264  --   --  375   < > = values in this interval not displayed.    BMET:  Recent Labs    04/19/17 1157  04/20/17 0114 04/20/17 0420  NA 135   < > 133* 133*  K 3.3*   < > 3.0* 3.2*  CL 85*   < > 80* 84*  CO2 40*  --   --  38*  GLUCOSE 139*   < > 141* 140*  BUN 29*   < > 30* 30*  CREATININE 1.13   < > 1.10 1.05  CALCIUM 8.2*  --   --  8.1*   < > = values in this interval not displayed.     PT/INR:   Recent Labs    04/18/17 0445  LABPROT 15.8*  INR 1.27    CBG (last 3)  Recent Labs    04/19/17 2327 04/20/17 0320 04/20/17 0828  GLUCAP 140* 116* 140*    ABG    Component Value Date/Time   PHART 7.471 (H) 04/20/2017 0444   PCO2ART 63.9 (H) 04/20/2017 0444   PO2ART 80.0 (L) 04/20/2017 0444   HCO3 46.3 (H) 04/20/2017 0444   TCO2 48 (H) 04/20/2017 0444   ACIDBASEDEF 4.0 (H) 04/13/2017 0357   O2SAT 96.0 04/20/2017 0444    CXR: PORTABLE CHEST 1 VIEW  COMPARISON:  04/19/2017  FINDINGS: Endotracheal tube, feeding tube, right jugular central venous catheter, and right PICC are stable. Left jugular introducer and Swan-Ganz removed. NG tube placed. The tip is beyond the gastroesophageal junction. Stable vascular congestion and central basilar edema. Cardiomegaly. No pneumothorax.  IMPRESSION: Stable CHF.   Electronically Signed   By: Jolaine ClickArthur  Hoss M.D.   On: 04/20/2017 09:17   Assessment/Plan: S/P Procedure(s) (LRB): REMOVAL OF IMPELLA LEFT VENTRICULAR ASSIST DEVICE (N/A)  Overall clinically stable Maintaining NSR w/ stable BP, mixed venous coox 60%, CVP low Remains on levophed for BP support Oxygenation stable Diuresing well on lasix drip, weight down another 5 lbs Hypokalemia, induced by loop diuretics, metabolic alkalosis Afebrile w/  mild leukocytosis on IV meropenem   Supplement potassium more aggressively  Continue current plan  Purcell Nailslarence H Owen, MD 04/20/2017 10:18 AM

## 2017-04-21 ENCOUNTER — Inpatient Hospital Stay (HOSPITAL_COMMUNITY): Payer: Medicare Other

## 2017-04-21 LAB — CBC WITH DIFFERENTIAL/PLATELET
BASOS PCT: 0 %
Basophils Absolute: 0 10*3/uL (ref 0.0–0.1)
EOS PCT: 1 %
Eosinophils Absolute: 0.2 10*3/uL (ref 0.0–0.7)
HEMATOCRIT: 34 % — AB (ref 39.0–52.0)
Hemoglobin: 10.8 g/dL — ABNORMAL LOW (ref 13.0–17.0)
Lymphocytes Relative: 8 %
Lymphs Abs: 1 10*3/uL (ref 0.7–4.0)
MCH: 28.3 pg (ref 26.0–34.0)
MCHC: 31.8 g/dL (ref 30.0–36.0)
MCV: 89 fL (ref 78.0–100.0)
MONO ABS: 0.7 10*3/uL (ref 0.1–1.0)
MONOS PCT: 5 %
NEUTROS ABS: 11.7 10*3/uL — AB (ref 1.7–7.7)
Neutrophils Relative %: 86 %
Platelets: 426 10*3/uL — ABNORMAL HIGH (ref 150–400)
RBC: 3.82 MIL/uL — ABNORMAL LOW (ref 4.22–5.81)
RDW: 15 % (ref 11.5–15.5)
WBC: 13.5 10*3/uL — ABNORMAL HIGH (ref 4.0–10.5)

## 2017-04-21 LAB — BLOOD GAS, ARTERIAL
Acid-Base Excess: 15.9 mmol/L — ABNORMAL HIGH (ref 0.0–2.0)
Bicarbonate: 40.4 mmol/L — ABNORMAL HIGH (ref 20.0–28.0)
FIO2: 40
LHR: 15 {breaths}/min
MECHVT: 450 mL
O2 SAT: 94.4 %
PATIENT TEMPERATURE: 98.6
PCO2 ART: 51.1 mmHg — AB (ref 32.0–48.0)
PEEP: 5 cmH2O
PO2 ART: 68.9 mmHg — AB (ref 83.0–108.0)
pH, Arterial: 7.509 — ABNORMAL HIGH (ref 7.350–7.450)

## 2017-04-21 LAB — COOXEMETRY PANEL
CARBOXYHEMOGLOBIN: 1.3 % (ref 0.5–1.5)
Carboxyhemoglobin: 0.9 % (ref 0.5–1.5)
Carboxyhemoglobin: 1 % (ref 0.5–1.5)
Methemoglobin: 0.7 % (ref 0.0–1.5)
Methemoglobin: 1.1 % (ref 0.0–1.5)
Methemoglobin: 1.2 % (ref 0.0–1.5)
O2 SAT: 97.7 %
O2 SAT: 52.3 %
O2 Saturation: 52.4 %
TOTAL HEMOGLOBIN: 5.8 g/dL — AB (ref 12.0–16.0)
TOTAL HEMOGLOBIN: 10.7 g/dL — AB (ref 12.0–16.0)
Total hemoglobin: 12.8 g/dL (ref 12.0–16.0)

## 2017-04-21 LAB — BASIC METABOLIC PANEL
ANION GAP: 11 (ref 5–15)
BUN: 32 mg/dL — AB (ref 6–20)
CALCIUM: 8.6 mg/dL — AB (ref 8.9–10.3)
CO2: 38 mmol/L — AB (ref 22–32)
Chloride: 82 mmol/L — ABNORMAL LOW (ref 101–111)
Creatinine, Ser: 1 mg/dL (ref 0.61–1.24)
GFR calc Af Amer: >60 mL/min (ref >60)
GFR calc non Af Amer: >60 mL/min (ref >60)
GLUCOSE: 133 mg/dL — AB (ref 65–99)
Potassium: 3.2 mmol/L — ABNORMAL LOW (ref 3.5–5.1)
Sodium: 131 mmol/L — ABNORMAL LOW (ref 135–145)

## 2017-04-21 LAB — POCT I-STAT, CHEM 8
BUN: 30 mg/dL — AB (ref 6–20)
CALCIUM ION: 1.14 mmol/L — AB (ref 1.15–1.40)
CHLORIDE: 80 mmol/L — AB (ref 101–111)
CREATININE: 0.9 mg/dL (ref 0.61–1.24)
GLUCOSE: 115 mg/dL — AB (ref 65–99)
HCT: 34 % — ABNORMAL LOW (ref 39.0–52.0)
Hemoglobin: 11.6 g/dL — ABNORMAL LOW (ref 13.0–17.0)
POTASSIUM: 3.7 mmol/L (ref 3.5–5.1)
Sodium: 130 mmol/L — ABNORMAL LOW (ref 135–145)
TCO2: 38 mmol/L — ABNORMAL HIGH (ref 22–32)

## 2017-04-21 LAB — GLUCOSE, CAPILLARY
Glucose-Capillary: 145 mg/dL — ABNORMAL HIGH (ref 65–99)
Glucose-Capillary: 124 mg/dL — ABNORMAL HIGH (ref 65–99)
Glucose-Capillary: 120 mg/dL — ABNORMAL HIGH (ref 65–99)
Glucose-Capillary: 135 mg/dL — ABNORMAL HIGH (ref 65–99)

## 2017-04-21 LAB — PHOSPHORUS: PHOSPHORUS: 3.1 mg/dL (ref 2.5–4.6)

## 2017-04-21 LAB — MAGNESIUM: Magnesium: 2.1 mg/dL (ref 1.7–2.4)

## 2017-04-21 MED ORDER — POTASSIUM CHLORIDE 10 MEQ/50ML IV SOLN
10.0000 meq | INTRAVENOUS | Status: AC
  Administered 2017-04-21 (×3): 10 meq via INTRAVENOUS
  Filled 2017-04-21: qty 50

## 2017-04-21 MED ORDER — ACETAZOLAMIDE 250 MG PO TABS
500.0000 mg | ORAL_TABLET | Freq: Two times a day (BID) | ORAL | Status: DC
  Administered 2017-04-21 – 2017-04-23 (×5): 500 mg via ORAL
  Filled 2017-04-21 (×6): qty 2

## 2017-04-21 MED ORDER — ACETAZOLAMIDE ER 500 MG PO CP12
500.0000 mg | ORAL_CAPSULE | Freq: Two times a day (BID) | ORAL | Status: DC
  Administered 2017-04-21: 500 mg via ORAL
  Filled 2017-04-21 (×2): qty 1

## 2017-04-21 MED ORDER — METOLAZONE 5 MG PO TABS
5.0000 mg | ORAL_TABLET | Freq: Every day | ORAL | Status: DC
  Administered 2017-04-21 – 2017-04-23 (×3): 5 mg via ORAL
  Filled 2017-04-21 (×3): qty 1

## 2017-04-21 MED ORDER — POTASSIUM CHLORIDE 20 MEQ/15ML (10%) PO SOLN
40.0000 meq | Freq: Three times a day (TID) | ORAL | Status: DC
  Administered 2017-04-21 – 2017-04-25 (×12): 40 meq
  Filled 2017-04-21 (×13): qty 30

## 2017-04-21 MED ORDER — POTASSIUM CHLORIDE 10 MEQ/50ML IV SOLN: 10.0000 meq | INTRAVENOUS | Status: DC

## 2017-04-21 MED ORDER — POTASSIUM CHLORIDE 10 MEQ/50ML IV SOLN
10.0000 meq | INTRAVENOUS | Status: AC
  Administered 2017-04-21 (×3): 10 meq via INTRAVENOUS
  Filled 2017-04-21 (×3): qty 50

## 2017-04-21 MED ORDER — POTASSIUM CHLORIDE 10 MEQ/50ML IV SOLN
10.0000 meq | INTRAVENOUS | Status: AC
  Administered 2017-04-21 (×3): 10 meq via INTRAVENOUS
  Filled 2017-04-21 (×4): qty 50

## 2017-04-21 NOTE — Progress Notes (Signed)
[facility] BRIEF SICU PROGRESS NOTE  4 Days Post-Op  S/P Procedure(s) (LRB): REMOVAL OF IMPELLA LEFT VENTRICULAR ASSIST DEVICE (N/A)   Stable day Repeat co-ox 52%  Plan: Continue current plan  Purcell Nailslarence H Owen, MD 04/21/2017 6:06 PM

## 2017-04-21 NOTE — Progress Notes (Signed)
Advanced Heart Failure Rounding Note   Subjective:    - S/P LV pseudoaneurysm repeat 11/14 Impella 5.0 Placed.  - Chest closed 11/16.  - Started on broad spectrum abx on 11/17 for fever and leukocytosis.  - Impella noted to have poor waveform am of 11/19. Thought to have possible clot.  Heparin restarted and pump pulled afternoon of 04/17/17.  Remains on vent. Awake and following commands but agitated at times. Now weaning on 40% but requiring a judicious amount of sedation. Diuresing very well on lasix gtt at 5 but CVP still 12.   On NE at 2-6 mcg/kg/min depending on sedation. Co-ox 52%. Remains I NSR. Bicarb 45  On meropenem/vanc for ESBL on fluid culture  Objective:   Weight Range: 92.6 kg (204 lb 2.3 oz) Body mass index is 31.97 kg/m.   Vital Signs:   Temp:  [98.6 F (37 C)-101.5 F (38.6 C)] 99 F (37.2 C) (11/23 1100) Pulse Rate:  [64-112] 79 (11/23 1100) Resp:  [7-31] 19 (11/23 1100) BP: (86-188)/(45-172) 114/66 (11/23 1100) SpO2:  [93 %-99 %] 96 % (11/23 1100) Arterial Line BP: (96-194)/(41-72) 117/50 (11/23 1100) FiO2 (%):  [40 %] 40 % (11/23 0908) Weight:  [92.6 kg (204 lb 2.3 oz)] 92.6 kg (204 lb 2.3 oz) (11/23 0426) Last BM Date: 04/20/17  Weight change: Filed Weights   04/19/17 0115 04/20/17 0200 04/21/17 0426  Weight: 98.4 kg (216 lb 14.9 oz) 96 kg (211 lb 10.3 oz) 92.6 kg (204 lb 2.3 oz)   Intake/Output:   Intake/Output Summary (Last 24 hours) at 04/21/2017 1202 Last data filed at 04/21/2017 1100 Gross per 24 hour  Intake 4115.1 ml  Output 6965 ml  Net -2849.9 ml    Physical Exam   CVP 12 General:  Intubated. Awake on vent and following commands HEENT: normal x for ETT Neck: supple. JVP up  Cor: PMI nondisplaced. Regular rate & rhythm. Distant HS Lungs: mild rhochi Abdomen: soft, nontender, + distended. No hepatosplenomegaly. No bruits or masses. Good bowel sounds. Extremities: no cyanosis, clubbing, rash, 1+ edema + foley and rectal  tube Neuro:awake on vent. Will follow commands    Telemetry   NSR 70-80s + PVC, personally reviewed.   EKG   N/A  Labs    CBC Recent Labs    04/20/17 0420 04/21/17 0333  WBC 13.1* 13.5*  NEUTROABS  --  11.7*  HGB 10.8* 10.8*  HCT 34.7* 34.0*  MCV 89.7 89.0  PLT 375 419*   Basic Metabolic Panel Recent Labs    04/20/17 0420 04/21/17 0333  NA 133* 131*  K 3.2* 3.2*  CL 84* 82*  CO2 38* 38*  GLUCOSE 140* 133*  BUN 30* 32*  CREATININE 1.05 1.00  CALCIUM 8.1* 8.6*  MG 2.1 2.1  PHOS 3.5 3.1   Liver Function Tests Recent Labs    04/19/17 0344  AST 40  ALT 51  ALKPHOS 59  BILITOT 0.9  PROT 5.6*  ALBUMIN 2.4*   No results for input(s): LIPASE, AMYLASE in the last 72 hours. Cardiac Enzymes No results for input(s): CKTOTAL, CKMB, CKMBINDEX, TROPONINI in the last 72 hours.  BNP: BNP (last 3 results) No results for input(s): BNP in the last 8760 hours.  ProBNP (last 3 results) No results for input(s): PROBNP in the last 8760 hours.   D-Dimer No results for input(s): DDIMER in the last 72 hours. Hemoglobin A1C No results for input(s): HGBA1C in the last 72 hours. Fasting Lipid Panel No results  for input(s): CHOL, HDL, LDLCALC, TRIG, CHOLHDL, LDLDIRECT in the last 72 hours. Thyroid Function Tests No results for input(s): TSH, T4TOTAL, T3FREE, THYROIDAB in the last 72 hours.  Invalid input(s): FREET3  Other results:   Imaging    Dg Chest Port 1 View  Result Date: 04/21/2017 CLINICAL DATA:  Respiratory failure EXAM: PORTABLE CHEST 1 VIEW COMPARISON:  04/20/2017 FINDINGS: Cardiac shadow remains enlarged. Endotracheal tube, nasogastric catheter and feeding catheter are again seen and stable. A right-sided PICC line and right jugular central line are seen and stable. Lungs are well aerated bilaterally with small bilateral pleural effusions left greater than right. The degree of vascular congestion has improved slightly in the interval from the prior  exam. IMPRESSION: Improved vascular congestion with persistent bilateral effusions. Tubes and lines as described. Electronically Signed   By: Inez Catalina M.D.   On: 04/21/2017 07:48     Medications:     Scheduled Medications: . acetaZOLAMIDE  500 mg Oral Q12H  . aspirin  81 mg Per Tube Daily  . atorvastatin  80 mg Per Tube q1800  . bisacodyl  10 mg Oral Daily   Or  . bisacodyl  10 mg Rectal Daily  . chlorhexidine gluconate (MEDLINE KIT)  15 mL Mouth Rinse BID  . Chlorhexidine Gluconate Cloth  6 each Topical Daily  . enoxaparin (LOVENOX) injection  40 mg Subcutaneous Q24H  . feeding supplement (PRO-STAT SUGAR FREE 64)  30 mL Per Tube BID  . fentaNYL (SUBLIMAZE) injection  50 mcg Intravenous Once  . insulin aspart  0-24 Units Subcutaneous Q4H  . mouth rinse  15 mL Mouth Rinse 10 times per day  . metolazone  5 mg Oral Daily  . pantoprazole sodium  40 mg Per Tube Daily  . potassium chloride  40 mEq Per Tube TID  . sodium chloride flush  10-40 mL Intracatheter Q12H  . sodium chloride flush  10-40 mL Intracatheter Q12H  . sodium chloride flush  3 mL Intravenous Q12H  . ticagrelor  90 mg Per Tube BID    Infusions: . sodium chloride Stopped (04/17/17 2130)  . sodium chloride    . sodium chloride 10 mL/hr at 04/21/17 1100  . sodium chloride    . amiodarone 30 mg/hr (04/21/17 1100)  . dexmedetomidine (PRECEDEX) IV infusion 1.2 mcg/kg/hr (04/21/17 1158)  . feeding supplement (VITAL HIGH PROTEIN) 1,000 mL (04/21/17 1117)  . fentaNYL infusion INTRAVENOUS 200 mcg/hr (04/21/17 1100)  . furosemide (LASIX) infusion 5 mg/hr (04/21/17 1100)  . lactated ringers    . lactated ringers 10 mL/hr at 04/21/17 1100  . lactated ringers Stopped (04/17/17 2133)  . meropenem (MERREM) IV Stopped (04/21/17 0555)  . norepinephrine (LEVOPHED) Adult infusion 4 mcg/min (04/21/17 1100)  . potassium chloride 10 mEq (04/21/17 1129)    PRN Medications: sodium chloride, acetaminophen (TYLENOL) oral liquid  160 mg/5 mL, albuterol, fentaNYL, Gerhardt's butt cream, lactated ringers, midazolam, ondansetron (ZOFRAN) IV, sodium chloride flush, sodium chloride flush, sodium chloride flush    Patient Profile   [name] is 66 year old with h/o MI, HTN, COPD admitted with chest pain. EKG with evidence of inferior/lateral STEMI. Due to lethargy and hypoxemia he was intubated in the ED.  Taken to cath lab with RCA DES and POBA to chronically occluded LAD with IABP placed.   Had LV pseudo aneurysm repair 11/14 .   Assessment/Plan   1. CAD with OOH anterior MI c/b LV pseudoaneurysm and cardiogenic shock - attempted PCI of LAD failed due  to chronic occlusion and extensive infarct - s/p PCI/DES to distal RCA. Finished cangrelor. Now on brilinta. P2Y12 inhibited - stable no s/s ischemia  2.  S/P LV pseudoaneurysm repair 11/14 - Chest closed 04/13/17. Stable.   3. Acute systolic HF-> cardiogenic shock - Impella removed 04/17/17 - EF improved 25%->40% on recent intra-op TEE - Remains on low-dose NE for BP support. Co-ox 52%. Would continue inotrope support until we get him extubated - Volume status remains elevated despite contraction alkalosis (also is CO2 retainer at baseline with pCO2 in 60s) - Will continue lasix gtt one more day and add diamox 500 bid to help facilitate extubation. Renal function stable  4. Acute Hypoxic Respiratory Failure  - Intubated 11/13.  - Baseline PCO2 60s  - Continue vent wean per CCM. Diurese to keep lungs dry. CXR with improving edema. + effusions. (viewed personally) Continue diuresis   5. AKI  - Due to ATN shock  - Resolved.  6. COPD with chronic CO2 retention  - Stable. No wheeze.   7. Thrombocytopenia - Resolved  8. ESBL E coli culture -04/15/17 ESBL from skin culture (penile meatus) - Of vanc Day 4 of meropenem.   9. PAF - Maintaining NSR on amio gtt. Switch to po when extubated  CRITICAL CARE Performed by: Glori Bickers  Total critical care  time: 35 minutes  Critical care time was exclusive of separately billable procedures and treating other patients.  Critical care was necessary to treat or prevent imminent or life-threatening deterioration.  Critical care was time spent personally by me (independent of midlevel providers or residents) on the following activities: development of treatment plan with patient and/or surrogate as well as nursing, discussions with consultants, evaluation of patient's response to treatment, examination of patient, obtaining history from patient or surrogate, ordering and performing treatments and interventions, ordering and review of laboratory studies, ordering and review of radiographic studies, pulse oximetry and re-evaluation of patient's condition.    Length of Stay: [place], MD  04/21/2017, 12:02 PM  Advanced Heart Failure Team Pager [phone] (M-F; 7a - 4p)   Please contact [facility] for night-coverage after hours (4p -7a ) and weekends on [email or web address]

## 2017-04-21 NOTE — Plan of Care (Signed)
Pt progressing

## 2017-04-21 NOTE — Progress Notes (Signed)
      [address]       [place] [id number]             [phone]        CARDIOTHORACIC SURGERY PROGRESS NOTE   R4 Days Post-Op Procedure(s) (LRB): REMOVAL OF IMPELLA LEFT VENTRICULAR ASSIST DEVICE (N/A)  Subjective: Sedated on vent.  Tolerating some vent weaning yesterday.  Still requiring fairly high dose sedation  Objective: Vital signs: BP Readings from Last 1 Encounters:  04/21/17 (!) 101/58   Pulse Readings from Last 1 Encounters:  04/21/17 79   Resp Readings from Last 1 Encounters:  04/21/17 20   Temp Readings from Last 1 Encounters:  04/21/17 (!) 101.5 F (38.6 C)    Hemodynamics: CVP:  [2 mmHg-55 mmHg] 10 mmHg  Mixed venous co-ox 52% but reportedly some difficulty drawing blood from central line  Physical Exam:  Rhythm:   sinus  Breath sounds: coarse  Heart sounds:  RRR  Incisions:  Dressing dry, intact  Abdomen:  Soft, non-distended, non-tender  Extremities:  Warm, well-perfused    Intake/Output from previous day: 11/22 0701 - 11/23 0700 In: 3281.9 [I.V.:2171.9; NG/GT:1060; IV Piggyback:50] Out: 7145 [Urine:5525; Emesis/NG output:1220; Stool:400] Intake/Output this shift: No intake/output data recorded.  Lab Results:  CBC: Recent Labs    04/20/17 0420 04/21/17 0333  WBC 13.1* 13.5*  HGB 10.8* 10.8*  HCT 34.7* 34.0*  PLT 375 426*    BMET:  Recent Labs    04/20/17 0420 04/21/17 0333  NA 133* 131*  K 3.2* 3.2*  CL 84* 82*  CO2 38* 38*  GLUCOSE 140* 133*  BUN 30* 32*  CREATININE 1.05 1.00  CALCIUM 8.1* 8.6*     PT/INR:  No results for input(s): LABPROT, INR in the last 72 hours.  CBG (last 3)  Recent Labs    04/20/17 2007 04/20/17 2316 04/21/17 0317  GLUCAP 130* 156* 145*    ABG    Component Value Date/Time   PHART 7.509 (H) 04/21/2017 0350   PCO2ART 51.1 (H) 04/21/2017 0350   PO2ART 68.9 (L) 04/21/2017 0350   HCO3 40.4 (H) 04/21/2017 0350   TCO2 48 (H) 04/20/2017 0444   ACIDBASEDEF 4.0 (H)  04/13/2017 0357   O2SAT 52.4 04/21/2017 0546    CXR: Mild CHF  Assessment/Plan: S/P Procedure(s) (LRB): REMOVAL OF IMPELLA LEFT VENTRICULAR ASSIST DEVICE (N/A)  Overall clinically stable Maintaining NSR w/ stable BP, mixed venous coox down 52% but reportedly some difficulty w/ sample - will repeat Remains on levophed for BP support Oxygenation stable Diuresing well on lasix drip, weight down another 7 lbs Hypokalemia, induced by loop diuretics, metabolic alkalosis Afebrile w/ mild leukocytosis on IV meropenem   Supplement potassium more aggressively  Check repeat co-ox  Lasix and pressors per HF team  Vent weaning and sedation per CCM team   Purcell Nailslarence H Owen, MD 04/21/2017 7:53 AM

## 2017-04-21 NOTE — Progress Notes (Signed)
Pharmacy Antibiotic Note  [name] is a 66 y.o. male admitted on 04/11/2017 with LV rupture, now being consulted for antibiotic treatment for ESBL E.Coli perineal infection. Pt is on day 4 of merropenem, WBC remains elevated, SCr stable.   Plan: -Meropenem 1g IV q8h -Monitor renal funx, cultures, LOT  Height: 5\' 7"  (170.2 cm) Weight: 204 lb 2.3 oz (92.6 kg) IBW/kg (Calculated) : 66.1  Temp (24hrs), Avg:100.2 F (37.9 C), Min:98.6 F (37 C), Max:101.5 F (38.6 C)  Recent Labs  Lab 04/15/17 1154  04/17/17 0504  04/18/17 0445 04/18/17 0925 04/19/17 0344  04/19/17 1157 04/19/17 1852 04/20/17 0114 04/20/17 0420 04/21/17 0333  WBC  --    < > 11.9*  --  13.8*  --  12.5*  --   --   --   --  13.1* 13.5*  CREATININE  --    < > 0.99   < > 1.03  --  1.11   < > 1.13 1.20 1.10 1.05 1.00  VANCOTROUGH 13*  --   --   --   --  20  --   --   --   --   --   --   --    < > = values in this interval not displayed.    Estimated Creatinine Clearance: 76.7 mL/min (by C-G formula based on SCr of 1 mg/dL).    No Known Allergies   Fredonia HighlandMichael Keiden Deskin, PharmD, BCPS PGY-2 Cardiology Pharmacy Resident Pager: [phone] 04/21/2017

## 2017-04-21 NOTE — Progress Notes (Signed)
PULMONARY / CRITICAL CARE MEDICINE   Name: Ronald HoveGary Zunker MRN: [id number] DOB: 20-Mar-1951    ADMISSION DATE:  04/11/2017 CONSULTATION DATE:  04/11/17  REFERRING MD:  Eldridge DaceVaranasi  CHIEF COMPLAINT:  STEMI  HISTORY OF PRESENT ILLNESS:  [name] is a 66 y.o. male with PMH of CAD and HTN.  [facility] with sudden onset of SOB and chest pain.  He was found to have inferolateral STEMI and was taken urgently to cath lab.  Prior to cath lab, he was intubated; therefore, PCCM asked to see in consultation to assist with vent management.  In cath lab, he was found to have 100% stenosed mid LAD lesion that was balloon angioplastied with 25% residual stenosis, distal RCA lesion which had DES placed, Ost 1st diag lesion 50% stenosed, proximal ramus 70% stenosed.  IABP was also placed. Has had ongoing shock, pressor dependent.   SUBJECTIVE:   RN reports agitation improving. Remains tachypneic on vent. Tolerating PSV trials.  VITAL SIGNS: BP (!) 128/52   Pulse 75   Temp 100 F (37.8 C)   Resp (!) 24   Ht 5\' 7"  (1.702 m)   Wt 204 lb 2.3 oz (92.6 kg)   SpO2 98%   BMI 31.97 kg/m   HEMODYNAMICS: CVP:  [2 mmHg-55 mmHg] 10 mmHg  VENTILATOR SETTINGS: Vent Mode: PSV;CPAP FiO2 (%):  [40 %] 40 % Set Rate:  [14 bmp-15 bmp] 14 bmp Vt Set:  [450 mL] 450 mL PEEP:  [5 cmH20] 5 cmH20 Pressure Support:  [8 cmH20-14 cmH20] 14 cmH20 Plateau Pressure:  [23 cmH20-30 cmH20] 30 cmH20  INTAKE / OUTPUT: I/O last 3 completed shifts: In: 5904.8 [I.V.:3594.8; NG/GT:1710; IV Piggyback:600] Out: [id number] [Urine:8760; Emesis/NG output:1720; Stool:400]   PHYSICAL EXAMINATION: General: Currently sedated, but awakens to voice and follows commandds HEENT: Full ventilatory support, ETT in place PSY: Sedated Neuro: Moves all extremities x4 CV: Sounds are regular currently being paced, external dressing dry and intact PULM: Decreased breath sounds in the bases; clear ventilated breath sounds  otherwise XB:JYNWGI:soft, non-tender, bsx4 active  Extremities: warm/dry, mild edema in LEs bilaterally and sacrum  Skin: no rashes or lesions  LABS:  BMET Recent Labs  Lab 04/19/17 1157  04/20/17 0114 04/20/17 0420 04/21/17 0333  NA 135   < > 133* 133* 131*  K 3.3*   < > 3.0* 3.2* 3.2*  CL 85*   < > 80* 84* 82*  CO2 40*  --   --  38* 38*  BUN 29*   < > 30* 30* 32*  CREATININE 1.13   < > 1.10 1.05 1.00  GLUCOSE 139*   < > 141* 140* 133*   < > = values in this interval not displayed.   Electrolytes Recent Labs  Lab 04/19/17 0344 04/19/17 1157 04/20/17 0420 04/21/17 0333  CALCIUM 7.9* 8.2* 8.1* 8.6*  MG 1.9  --  2.1 2.1  PHOS 2.3*  --  3.5 3.1   CBC Recent Labs  Lab 04/19/17 0344  04/20/17 0114 04/20/17 0420 04/21/17 0333  WBC 12.5*  --   --  13.1* 13.5*  HGB 10.2*   < > 11.2* 10.8* 10.8*  HCT 33.5*   < > 33.0* 34.7* 34.0*  PLT 264  --   --  375 426*   < > = values in this interval not displayed.   Coag's Recent Labs  Lab 04/17/17 0504 04/17/17 1650 04/18/17 0445  APTT 68* 44* 37*  INR  --   --  1.27   Sepsis Markers No results for input(s): LATICACIDVEN, PROCALCITON, O2SATVEN in the last 168 hours. ABG Recent Labs  Lab 04/19/17 1208 04/20/17 0444 04/21/17 0350  PHART 7.550* 7.471* 7.509*  PCO2ART 51.4* 63.9* 51.1*  PO2ART 59.0* 80.0* 68.9*   Liver Enzymes Recent Labs  Lab 04/16/17 0343 04/17/17 0504 04/19/17 0344  AST 43* 33 40  ALT 25 27 51  ALKPHOS 49 57 59  BILITOT 1.4* 1.0 0.9  ALBUMIN 2.4* 2.4* 2.4*   Cardiac Enzymes No results for input(s): TROPONINI, PROBNP in the last 168 hours. Glucose Recent Labs  Lab 04/20/17 1246 04/20/17 1702 04/20/17 2007 04/20/17 2316 04/21/17 0317 04/21/17 0810  GLUCAP 136* 136* 130* 156* 145* 124*    Imaging Dg Chest Port 1 View  Result Date: 04/21/2017 CLINICAL DATA:  Respiratory failure EXAM: PORTABLE CHEST 1 VIEW COMPARISON:  04/20/2017 FINDINGS: Cardiac shadow remains enlarged. Endotracheal  tube, nasogastric catheter and feeding catheter are again seen and stable. A right-sided PICC line and right jugular central line are seen and stable. Lungs are well aerated bilaterally with small bilateral pleural effusions left greater than right. The degree of vascular congestion has improved slightly in the interval from the prior exam. IMPRESSION: Improved vascular congestion with persistent bilateral effusions. Tubes and lines as described. Electronically Signed   By: Alcide Clever M.D.   On: 04/21/2017 07:48   STUDIES:  CXR 11/13 > possible PNA. Echo TEE 11/16 >     Right ventricle: Prior to chest closure, the RV size and systolic function appeared adequate. Following chest closure there was some reduction RV systolic function, but there was no RV enlargement or septal shift.  Mitral valve: The mitral valve leaflets were of normal thickness. There was good leaflet separation. There was normal leaflet coptation without prolapsing or flail leaflet segments. There was trace mitral insufficiency. The impella cannula did not interfere with the mitral valve apparatus.  Tricuspid valve: Trace regurgitation. The tricuspid valve regurgitation jet is central.   CULTURES: Blood 11/13 > 102 with E. coli Sputum 11/13 > negative Wound 11/17>> ecoli  ANTIBIOTICS: Ceftriaxone 11/13 > 11/17 Azithromycin 11/13 > 11/17 vanc 11/17>>>11/20 Zosyn 11/17>>>11/20 Meropenem 04/18/2017>>  SIGNIFICANT EVENTS: 11/13 > admit.  With resection of left ventricular aneurysm and placement of 5.0 Impella 04/17/2017 removal of clotted impella  pump     LINES/TUBES: ETT 11/13 >  R femoral sheath 11/13 >  R IJ CVL (bensimohn) 11/14>>>  DISCUSSION: 65 y.o. male admitted 11/13 with inferolateral STEMI.  He was intubated and was then taken to cath lab where he was found to have 100% stenosed mid LAD lesion that was balloon angioplastied with 25% residual stenosis, distal RCA lesion which had DES placed, Ost 1st  diag lesion 50% stenosed, proximal ramus 70% stenosed.  IABP was also placed. On f/u echo had ?MV rupture - taken urgently to OR.  Had LV pseudoaneurysm rupture, repaired, impala placed.   ASSESSMENT / PLAN:  PULMONARY A: Acute respiratory failure - r/t STEMI ? PNA - bilateral patchiness - ?aspiration v more likely edema  P:   PS trials ongoing; still remains somewhat volume up and tachypneic but possibly extubate tomorrow F/u CXR  F/u ABG KVO-IVF On Lasix gtt per HF team for diuresis with good effect Abx as below  CARDIOVASCULAR A:  Inferolateral STEMI - s/p cath lab where he was found to have 100% stenosed mid LAD lesion that was balloon angioplastied with 25% residual stenosis, distal RCA lesion which had DES placed, Suezanne Jacquet  1st diag lesion 50% stenosed, proximal ramus 70% stenosed.  IABP was also placed. LV pseudoaneurysm rupture - to OR emergently 11/14, chest closure 11/16 Hypotension - suspect sedation related (was normotensive on arrival to ICU but was somewhat agitated so got additional sedation and later became hypotensive). Hx HTN, CAD. P:  Cardiology/CVTS managing. Epi off, weaning levophed as able Follow CVPs  Diuresing well on lasix gtt as below Continue amiodarone  Anticoagulation per cards - cangrelor   RENAL A:   NAGMA. AKI - improving.  Good UOP P:   Lasix drip decreased to 5 mg/hr BMP in AM. Replace electrolytes as needed   GASTROINTESTINAL A:   GI prophylaxis. Nutrition. P:   SUP: Pantoprazole. TF per nutrition  HEMATOLOGIC A:   VTE Prophylaxis. P:  Anticoagulation as above  CBC in AM.  INFECTIOUS A:   Perineal wound infection. P:   Meropenem for ESBL; plan for 7 days from start of Meropeneum Follow cultures - ESBL from perineal wound Trend wbc, fever curve  Repeat Cultures if fevers  ENDOCRINE A:   No acute issues. P:   Monitor glucose on chem   NEUROLOGIC A:   Sedation due to mechanical ventilation. P:   Sedation:  Precedex,  fentanyl, PRN versed  RASS goal: 0 to -1. Daily WUA.  Family updated: No family bedside 11/23.  Continue diureses, PS trials, minimize sedation as able.  Interdisciplinary Family Meeting v Palliative Care Meeting:  Due by: 04/17/17.  CCT 31 min  Morene AntuKenton  Grosser, MD [place] [facility] 04/21/2017, 9:15 AM

## 2017-04-22 LAB — BASIC METABOLIC PANEL
Anion gap: 11 (ref 5–15)
BUN: 32 mg/dL — AB (ref 6–20)
CALCIUM: 8.8 mg/dL — AB (ref 8.9–10.3)
CO2: 37 mmol/L — AB (ref 22–32)
Chloride: 82 mmol/L — ABNORMAL LOW (ref 101–111)
Creatinine, Ser: 0.99 mg/dL (ref 0.61–1.24)
GFR calc Af Amer: >60 mL/min (ref >60)
GFR calc non Af Amer: >60 mL/min (ref >60)
GLUCOSE: 132 mg/dL — AB (ref 65–99)
Potassium: 3.5 mmol/L (ref 3.5–5.1)
Sodium: 130 mmol/L — ABNORMAL LOW (ref 135–145)

## 2017-04-22 LAB — BLOOD GAS, ARTERIAL
Acid-Base Excess: 13.7 mmol/L — ABNORMAL HIGH (ref 0.0–2.0)
Bicarbonate: 38.5 mmol/L — ABNORMAL HIGH (ref 20.0–28.0)
DRAWN BY: 252031
FIO2: 40
MECHVT: 450 mL
O2 SAT: 94.8 %
PEEP: 5 cmH2O
PH ART: 7.452 — AB (ref 7.350–7.450)
Patient temperature: 98.6
RATE: 15 resp/min
pCO2 arterial: 56 mmHg — ABNORMAL HIGH (ref 32.0–48.0)
pO2, Arterial: 77.3 mmHg — ABNORMAL LOW (ref 83.0–108.0)

## 2017-04-22 LAB — COOXEMETRY PANEL
CARBOXYHEMOGLOBIN: 1 % (ref 0.5–1.5)
Methemoglobin: 1.1 % (ref 0.0–1.5)
O2 SAT: 70.3 %
TOTAL HEMOGLOBIN: 11.7 g/dL — AB (ref 12.0–16.0)

## 2017-04-22 LAB — GLUCOSE, CAPILLARY
GLUCOSE-CAPILLARY: 150 mg/dL — AB (ref 65–99)
GLUCOSE-CAPILLARY: 137 mg/dL — AB (ref 65–99)
Glucose-Capillary: 117 mg/dL — ABNORMAL HIGH (ref 65–99)
Glucose-Capillary: 117 mg/dL — ABNORMAL HIGH (ref 65–99)
Glucose-Capillary: 129 mg/dL — ABNORMAL HIGH (ref 65–99)
Glucose-Capillary: 141 mg/dL — ABNORMAL HIGH (ref 65–99)
Glucose-Capillary: 150 mg/dL — ABNORMAL HIGH (ref 65–99)

## 2017-04-22 MED ORDER — POTASSIUM CHLORIDE 10 MEQ/50ML IV SOLN
10.0000 meq | INTRAVENOUS | Status: AC
  Administered 2017-04-22 (×3): 10 meq via INTRAVENOUS
  Filled 2017-04-22: qty 50

## 2017-04-22 NOTE — Progress Notes (Signed)
      [address]       [place] [id number]             [phone]        CARDIOTHORACIC SURGERY PROGRESS NOTE   R5 Days Post-Op Procedure(s) (LRB): REMOVAL OF IMPELLA LEFT VENTRICULAR ASSIST DEVICE (N/A)  Subjective: Sedated on vent.  Will follow some commands.  Still intermittently gets agitated.  Tolerated SBT x4 hours yesterday - starting vent wean currently  Objective: Vital signs: BP Readings from Last 1 Encounters:  04/22/17 (!) 106/57   Pulse Readings from Last 1 Encounters:  04/22/17 73   Resp Readings from Last 1 Encounters:  04/22/17 18   Temp Readings from Last 1 Encounters:  04/22/17 98.8 F (37.1 C)    Hemodynamics: CVP:  [2 mmHg-18 mmHg] 4 mmHg  Mixed venous co-ox 70%   Physical Exam:  Rhythm:   sinus  Breath sounds: coarse  Heart sounds:  RRR  Incisions:  Clean and dry  Abdomen:  Soft, non-distended, tolerating tube feeds  Extremities:  Warm, well-perfused   Intake/Output from previous day: 11/23 0701 - 11/24 0700 In: 5470.4 [I.V.:2850.4; NG/GT:2170; IV Piggyback:450] Out: 6960 [Urine:6160; Emesis/NG output:800] Intake/Output this shift: Total I/O In: 121.5 [I.V.:41.5; NG/GT:80] Out: 600 [Urine:600]  Lab Results:  CBC: Recent Labs    04/20/17 0420 04/21/17 0333 04/21/17 1810  WBC 13.1* 13.5*  --   HGB 10.8* 10.8* 11.6*  HCT 34.7* 34.0* 34.0*  PLT 375 426*  --     BMET:  Recent Labs    04/21/17 0333 04/21/17 1810 04/22/17 0355  NA 131* 130* 130*  K 3.2* 3.7 3.5  CL 82* 80* 82*  CO2 38*  --  37*  GLUCOSE 133* 115* 132*  BUN 32* 30* 32*  CREATININE 1.00 0.90 0.99  CALCIUM 8.6*  --  8.8*     PT/INR:  No results for input(s): LABPROT, INR in the last 72 hours.  CBG (last 3)  Recent Labs    04/22/17 0011 04/22/17 0426 04/22/17 0823  GLUCAP 141* 150* 150*    ABG    Component Value Date/Time   PHART 7.452 (H) 04/22/2017 0334   PCO2ART 56.0 (H) 04/22/2017 0334   PO2ART 77.3 (L) 04/22/2017 0334    HCO3 38.5 (H) 04/22/2017 0334   TCO2 38 (H) 04/21/2017 1810   ACIDBASEDEF 4.0 (H) 04/13/2017 0357   O2SAT 70.3 04/22/2017 0422    CXR: n/a  Assessment/Plan: S/P Procedure(s) (LRB): REMOVAL OF IMPELLA LEFT VENTRICULAR ASSIST DEVICE (N/A)  Overall clinically stable Maintaining NSR w/ stable BP, mixed venous coox 70%, currently off levophed Oxygenation stable Diuresing well on lasix drip, weight down another 7 lbs Hypokalemia, induced by loop diuretics, metabolic alkalosis Afebrile w/ mild leukocytosis on IV meropenem   Supplement potassium more aggressively  Lasix and pressors per HF team  Vent weaning and sedation per CCM team   Purcell Nailslarence H Owen, MD 04/22/2017 9:51 AM

## 2017-04-22 NOTE — Progress Notes (Signed)
PULMONARY / CRITICAL CARE MEDICINE   Name: [name] MRN: [id number] DOB: 06/02/1950    ADMISSION DATE:  04/11/2017 CONSULTATION DATE:  04/11/17  REFERRING MD:  Eldridge Dace  CHIEF COMPLAINT:  STEMI  HISTORY OF PRESENT ILLNESS:  [name] is a 66 y.o. male with PMH of CAD and HTN.  He presented to [facility] ED 11/13 with sudden onset of SOB and chest pain.  He was found to have inferolateral STEMI and was taken urgently to cath lab.  Prior to cath lab, he was intubated; therefore, PCCM asked to see in consultation to assist with vent management.  In cath lab, he was found to have 100% stenosed mid LAD lesion that was balloon angioplastied with 25% residual stenosis, distal RCA lesion which had DES placed, Ost 1st diag lesion 50% stenosed, proximal ramus 70% stenosed.  IABP was also placed. Has had ongoing shock, pressor dependent.   SUBJECTIVE:   Agitation remains an issue. VITAL SIGNS: BP 103/60   Pulse 75   Temp (!) 100.8 F (38.2 C)   Resp 16   Ht 5\' 7"  (1.702 m)   Wt 197 lb 5 oz (89.5 kg)   SpO2 96%   BMI 30.90 kg/m   HEMODYNAMICS: CVP:  [2 mmHg-18 mmHg] 11 mmHg  VENTILATOR SETTINGS: Vent Mode: PRVC FiO2 (%):  [40 %] 40 % Set Rate:  [15 bmp] 15 bmp Vt Set:  [450 mL] 450 mL PEEP:  [5 cmH20] 5 cmH20 Pressure Support:  [14 cmH20] 14 cmH20 Plateau Pressure:  [15 cmH20-35 cmH20] 23 cmH20  INTAKE / OUTPUT: I/O last 3 completed shifts: In: 7139.1 [I.V.:4059.1; NG/GT:2630; IV Piggyback:450] Out: 40981 [Urine:9510; Emesis/NG output:1300]   PHYSICAL EXAMINATION:   General well-nourished well-developed male Skin: no rashes or lesionsollow commands CV: s1s2 rrr, no m/r/g PULM: Decreased breath sounds in the bases XB:JYNW, non-tender, bsx4 active  Extremities: warm/dry, 1+ edema  Skin: no rashes or lesions   LABS:  BMET Recent Labs  Lab 04/20/17 0420 04/21/17 0333 04/21/17 1810 04/22/17 0355  NA 133* 131* 130* 130*  K 3.2* 3.2* 3.7 3.5  CL 84* 82* 80* 82*  CO2  38* 38*  --  37*  BUN 30* 32* 30* 32*  CREATININE 1.05 1.00 0.90 0.99  GLUCOSE 140* 133* 115* 132*   Electrolytes Recent Labs  Lab 04/19/17 0344  04/20/17 0420 04/21/17 0333 04/22/17 0355  CALCIUM 7.9*   < > 8.1* 8.6* 8.8*  MG 1.9  --  2.1 2.1  --   PHOS 2.3*  --  3.5 3.1  --    < > = values in this interval not displayed.   CBC Recent Labs  Lab 04/19/17 0344  04/20/17 0420 04/21/17 0333 04/21/17 1810  WBC 12.5*  --  13.1* 13.5*  --   HGB 10.2*   < > 10.8* 10.8* 11.6*  HCT 33.5*   < > 34.7* 34.0* 34.0*  PLT 264  --  375 426*  --    < > = values in this interval not displayed.   Coag's Recent Labs  Lab 04/17/17 0504 04/17/17 1650 04/18/17 0445  APTT 68* 44* 37*  INR  --   --  1.27   Sepsis Markers No results for input(s): LATICACIDVEN, PROCALCITON, O2SATVEN in the last 168 hours. ABG Recent Labs  Lab 04/20/17 0444 04/21/17 0350 04/22/17 0334  PHART 7.471* 7.509* 7.452*  PCO2ART 63.9* 51.1* 56.0*  PO2ART 80.0* 68.9* 77.3*   Liver Enzymes Recent Labs  Lab 04/16/17 0343 04/17/17 0504  04/19/17 0344  AST 43* 33 40  ALT 25 27 51  ALKPHOS 49 57 59  BILITOT 1.4* 1.0 0.9  ALBUMIN 2.4* 2.4* 2.4*   Cardiac Enzymes No results for input(s): TROPONINI, PROBNP in the last 168 hours. Glucose Recent Labs  Lab 04/21/17 1211 04/21/17 1616 04/21/17 2002 04/22/17 0011 04/22/17 0426 04/22/17 0823  GLUCAP 120* 135* 129* 141* 150* 150*    Imaging No results found. STUDIES:  CXR 11/13 > possible PNA. Echo TEE 11/16 >     Right ventricle: Prior to chest closure, the RV size and systolic function appeared adequate. Following chest closure there was some reduction RV systolic function, but there was no RV enlargement or septal shift.  Mitral valve: The mitral valve leaflets were of normal thickness. There was good leaflet separation. There was normal leaflet coptation without prolapsing or flail leaflet segments. There was trace mitral insufficiency. The  impella cannula did not interfere with the mitral valve apparatus.  Tricuspid valve: Trace regurgitation. The tricuspid valve regurgitation jet is central.   CULTURES: Blood 11/13 > 102 with E. coli Sputum 11/13 > negative Wound 11/17>> ecoli  ANTIBIOTICS: Ceftriaxone 11/13 > 11/17 Azithromycin 11/13 > 11/17 vanc 11/17>>>11/20 Zosyn 11/17>>>11/20 Meropenem 04/18/2017>>  SIGNIFICANT EVENTS: 11/13 > admit.  With resection of left ventricular aneurysm and placement of 5.0 Impella 04/17/2017 removal of clotted impella  pump     LINES/TUBES: ETT 11/13 >  R femoral sheath 11/13 >  R IJ CVL (bensimohn) 11/14>>>  DISCUSSION: 66 y.o. male admitted 11/13 with inferolateral STEMI.  He was intubated and was then taken to cath lab where he was found to have 100% stenosed mid LAD lesion that was balloon angioplastied with 25% residual stenosis, distal RCA lesion which had DES placed, Ost 1st diag lesion 50% stenosed, proximal ramus 70% stenosed.  IABP was also placed. On f/u echo had ?MV rupture - taken urgently to OR.  Had LV pseudoaneurysm rupture, repaired, impala placed.   ASSESSMENT / PLAN:  PULMONARY A: Acute respiratory failure - r/t STEMI ? PNA - bilateral patchiness - ?aspiration v edema  P:   Begin PS trials, no extubation today given fluid status F/u CXR  F/u ABG KVO-IVF continueaggressive diureses Abx as below  CARDIOVASCULAR A:  Inferolateral STEMI - s/p cath lab where he was found to have 100% stenosed mid LAD lesion that was balloon angioplastied with 25% residual stenosis, distal RCA lesion which had DES placed, Ost 1st diag lesion 50% stenosed, proximal ramus 70% stenosed.  IABP was also placed. LV pseudoaneurysm rupture - to OR emergently 11/14, chest closure 11/16 Hypotension - suspect sedation related (was normotensive on arrival to ICU but was somewhat agitated so got additional sedation and later became hypotensive). Hx HTN, CAD. P:  Cardiology/ CVTS  managing. Epi off, weaning levophed as able Follow CVPs  Diuresing well on lasix gtt as below Added zaroxolyn 10 mg PO Continue amiodarone  Anticoagulation per cards - bivalirudin, cangrelor   RENAL Lab Results  Component Value Date   CREATININE 0.99 04/22/2017   CREATININE 0.90 04/21/2017   CREATININE 1.00 04/21/2017    Intake/Output Summary (Last 24 hours) at 04/22/2017 1056 Last data filed at 04/22/2017 0951 Gross per 24 hour  Intake 4209.86 ml  Output 7115 ml  Net -2905.14 ml     A:   NAGMA. AKI - improving.  Good UOP P:   Lasix drip decreased to 5 mg/hr Zaroxolyn 10 mg PO x1 BMP in AM. Replace electrolytes as needed  GASTROINTESTINAL A:   GI prophylaxis. Nutrition. P:   SUP: Pantoprazole. TF per nutrition  HEMATOLOGIC A:   VTE Prophylaxis. P:  Anticoagulation as above  CBC daily  INFECTIOUS A:   Possible PNA. P:   Broad spectrum abx as above  Follow cultures - neg to date  Trend wbc, fever curve   ENDOCRINE A:   No acute issues. P:   Monitor glucose on chem   NEUROLOGIC A:   Sedation due to mechanical ventilation. P:   Sedation:  Precedex, fentanyl, PRN versed  RASS goal: 0 to -1. Daily WUA. Will increase precedex ceiling to 1.2 on 04/19/2017 Use as needed Versed Agitation continues to be a block towards liberation from  Family updated: No family bedside 11/21.  Continue diureses, PS trials, minimize sedation as able.  Interdisciplinary Family Meeting v Palliative Care Meeting:  Due by: 04/17/17.  cct 30 min  Brett CanalesSteve Cressida Milford ACNP [facility] P[facility]M Pager [phone] till 1 pm If no answer page [phone] 04/22/2017, 10:52 AM

## 2017-04-22 NOTE — Plan of Care (Signed)
Aggressive diuresis w/Lasix gtt w/continuous CVP monitoring. Levo for BP support. TF continued for nutrition. Q2hr turns. Per day nurse pt weaned ~4hr on vent.

## 2017-04-22 NOTE — Progress Notes (Signed)
Advanced Heart Failure Rounding Note   Subjective:    - S/P LV pseudoaneurysm repeat 11/14 Impella 5.0 Placed.  - Chest closed 11/16.  - Started on broad spectrum abx on 11/17 for fever and leukocytosis.  - Impella noted to have poor waveform am of 11/19. Thought to have possible clot.  Heparin restarted and pump pulled afternoon of 04/17/17.  Remains on vent. Tolerated 4 hours of SBT yesterday   Awakens and will follow commands but then gets agitated.  Weaning again now. Remains on low-dose NE. CO-ox 70% today. Diuresed 7L yesterday (but was in 5.5L) on lasix gtt, diamox and one-dose metolazone. Weight down 7 pounds. Creatinine improved.     On meropenem/vanc for ESBL on fluid culture  Objective:   Weight Range: 89.5 kg (197 lb 5 oz) Body mass index is 30.9 kg/m.   Vital Signs:   Temp:  [98.4 F (36.9 C)-101.1 F (38.4 C)] 100.8 F (38.2 C) (11/24 1015) Pulse Rate:  [63-101] 75 (11/24 1015) Resp:  [14-33] 16 (11/24 1015) BP: (90-145)/(53-114) 103/60 (11/24 1000) SpO2:  [92 %-100 %] 96 % (11/24 1015) Arterial Line BP: (95-210)/(41-79) 113/45 (11/24 1015) FiO2 (%):  [40 %] 40 % (11/24 0344) Weight:  [89.5 kg (197 lb 5 oz)] 89.5 kg (197 lb 5 oz) (11/24 0500) Last BM Date: 04/22/17  Weight change: Filed Weights   04/20/17 0200 04/21/17 0426 04/22/17 0500  Weight: 96 kg (211 lb 10.3 oz) 92.6 kg (204 lb 2.3 oz) 89.5 kg (197 lb 5 oz)   Intake/Output:   Intake/Output Summary (Last 24 hours) at 04/22/2017 1046 Last data filed at 04/22/2017 0951 Gross per 24 hour  Intake 4209.86 ml  Output 7115 ml  Net -2905.14 ml    Physical Exam   CVP 9-10 General: Intubated. Awake on vent. Weaning. Follows commands  HEENT: normal x for ETT Neck: supple. Carotids 2+ bilat no bruits Cor: PMI nondisplaced. RRR distant no murmur Lungs: mechanical BS otherwise clear  Abdomen: soft NT. mininimally distended . No hepatosplenomegaly. No bruits or masses. Good BS Extremities: no  cyanosis, clubbing, rash, 1-2+ edema + rectal tube and foley Neuro: Intubated. Awake on vent. Weaning. Follows commands   Telemetry   NSR 70s + PVC, personally reviewed.   EKG   N/A  Labs    CBC Recent Labs    04/20/17 0420 04/21/17 0333 04/21/17 1810  WBC 13.1* 13.5*  --   NEUTROABS  --  11.7*  --   HGB 10.8* 10.8* 11.6*  HCT 34.7* 34.0* 34.0*  MCV 89.7 89.0  --   PLT 375 426*  --    Basic Metabolic Panel Recent Labs    04/20/17 0420 04/21/17 0333 04/21/17 1810 04/22/17 0355  NA 133* 131* 130* 130*  K 3.2* 3.2* 3.7 3.5  CL 84* 82* 80* 82*  CO2 38* 38*  --  37*  GLUCOSE 140* 133* 115* 132*  BUN 30* 32* 30* 32*  CREATININE 1.05 1.00 0.90 0.99  CALCIUM 8.1* 8.6*  --  8.8*  MG 2.1 2.1  --   --   PHOS 3.5 3.1  --   --    Liver Function Tests No results for input(s): AST, ALT, ALKPHOS, BILITOT, PROT, ALBUMIN in the last 72 hours. No results for input(s): LIPASE, AMYLASE in the last 72 hours. Cardiac Enzymes No results for input(s): CKTOTAL, CKMB, CKMBINDEX, TROPONINI in the last 72 hours.  BNP: BNP (last 3 results) No results for input(s): BNP in the last  8760 hours.  ProBNP (last 3 results) No results for input(s): PROBNP in the last 8760 hours.   D-Dimer No results for input(s): DDIMER in the last 72 hours. Hemoglobin A1C No results for input(s): HGBA1C in the last 72 hours. Fasting Lipid Panel No results for input(s): CHOL, HDL, LDLCALC, TRIG, CHOLHDL, LDLDIRECT in the last 72 hours. Thyroid Function Tests No results for input(s): TSH, T4TOTAL, T3FREE, THYROIDAB in the last 72 hours.  Invalid input(s): FREET3  Other results:   Imaging    No results found.   Medications:     Scheduled Medications: . acetaZOLAMIDE  500 mg Oral BID  . aspirin  81 mg Per Tube Daily  . atorvastatin  80 mg Per Tube q1800  . bisacodyl  10 mg Oral Daily   Or  . bisacodyl  10 mg Rectal Daily  . chlorhexidine gluconate (MEDLINE KIT)  15 mL Mouth Rinse BID    . Chlorhexidine Gluconate Cloth  6 each Topical Daily  . enoxaparin (LOVENOX) injection  40 mg Subcutaneous Q24H  . feeding supplement (PRO-STAT SUGAR FREE 64)  30 mL Per Tube BID  . fentaNYL (SUBLIMAZE) injection  50 mcg Intravenous Once  . insulin aspart  0-24 Units Subcutaneous Q4H  . mouth rinse  15 mL Mouth Rinse 10 times per day  . metolazone  5 mg Oral Daily  . pantoprazole sodium  40 mg Per Tube Daily  . potassium chloride  40 mEq Per Tube TID  . sodium chloride flush  10-40 mL Intracatheter Q12H  . sodium chloride flush  10-40 mL Intracatheter Q12H  . sodium chloride flush  3 mL Intravenous Q12H  . ticagrelor  90 mg Per Tube BID    Infusions: . sodium chloride Stopped (04/17/17 2130)  . sodium chloride    . sodium chloride 10 mL/hr at 04/22/17 0700  . sodium chloride    . amiodarone 30 mg/hr (04/22/17 0700)  . dexmedetomidine (PRECEDEX) IV infusion 1.2 mcg/kg/hr (04/22/17 0951)  . feeding supplement (VITAL HIGH PROTEIN) 1,000 mL (04/22/17 1042)  . fentaNYL infusion INTRAVENOUS 250 mcg/hr (04/22/17 0837)  . furosemide (LASIX) infusion 5 mg/hr (04/22/17 0700)  . lactated ringers    . lactated ringers 10 mL/hr at 04/22/17 0700  . lactated ringers Stopped (04/17/17 2133)  . meropenem (MERREM) IV Stopped (04/22/17 2671)  . norepinephrine (LEVOPHED) Adult infusion 4 mcg/min (04/22/17 1000)  . potassium chloride 10 mEq (04/22/17 0951)  . potassium chloride      PRN Medications: sodium chloride, acetaminophen (TYLENOL) oral liquid 160 mg/5 mL, albuterol, fentaNYL, Gerhardt's butt cream, lactated ringers, midazolam, ondansetron (ZOFRAN) IV, sodium chloride flush, sodium chloride flush, sodium chloride flush    Patient Profile   [name] is 66 year old with h/o MI, HTN, COPD admitted with chest pain. EKG with evidence of inferior/lateral STEMI. Due to lethargy and hypoxemia he was intubated in the ED.  Taken to cath lab with RCA DES and POBA to chronically occluded LAD  with IABP placed.   Had LV pseudo aneurysm repair 11/14 .   Assessment/Plan   1. CAD with OOH anterior MI c/b LV pseudoaneurysm and cardiogenic shock - attempted PCI of LAD failed due to chronic occlusion and extensive infarct - s/p PCI/DES to distal RCA. Finished cangrelor. Now on brilinta. P2Y12 inhibited - stable no s/s ischemia  2.  S/P LV pseudoaneurysm repair 11/14 - Chest closed 04/13/17. Stable.   3. Acute systolic HF-> cardiogenic shock - Impella removed 04/17/17 - EF improved 25%->40%  on recent intra-op TEE - Remains on low-dose NE for BP support. Co-ox improved to 70%.  - Volume status much improved. CVP still 9-10. Will continue IV lasix one more day.   4. Acute Hypoxic Respiratory Failure  - Intubated 11/13.  - Baseline PCO2 60s  - CXR and ABG improved (viewed personally). Vent wean per CCM d/w them personally. They will keep intubated today. May need trach.  - Will continue IV diuresis one more day  5. AKI  - Due to ATN shock  - Resolved. Creatinine 0.99 today  6. COPD with chronic CO2 retention  - Stable. No wheeze.   7. Thrombocytopenia - Resolved  8. ESBL E coli culture -04/15/17 ESBL from skin culture (penile meatus) - Of vanc Day 5 of meropenem.   9. PAF - Maintaining NSR on amio gtt. Switch to po when extubated  CRITICAL CARE Performed by: Glori Bickers  Total critical care time: 35 minutes  Critical care time was exclusive of separately billable procedures and treating other patients.  Critical care was necessary to treat or prevent imminent or life-threatening deterioration.  Critical care was time spent personally by me (independent of midlevel providers or residents) on the following activities: development of treatment plan with patient and/or surrogate as well as nursing, discussions with consultants, evaluation of patient's response to treatment, examination of patient, obtaining history from patient or surrogate, ordering and  performing treatments and interventions, ordering and review of laboratory studies, ordering and review of radiographic studies, pulse oximetry and re-evaluation of patient's condition.    Length of Stay: [place], MD  04/22/2017, 10:46 AM  Advanced Heart Failure Team Pager [phone] (M-F; 7a - 4p)   Please contact [facility] for night-coverage after hours (4p -7a ) and weekends on [email or web address]

## 2017-04-23 ENCOUNTER — Inpatient Hospital Stay (HOSPITAL_COMMUNITY): Payer: Medicare Other

## 2017-04-23 LAB — BLOOD GAS, ARTERIAL
ACID-BASE EXCESS: 9 mmol/L — AB (ref 0.0–2.0)
Bicarbonate: 34.2 mmol/L — ABNORMAL HIGH (ref 20.0–28.0)
FIO2: 0.3
O2 SAT: 96.9 %
PCO2 ART: 58.2 mmHg — AB (ref 32.0–48.0)
PEEP: 5 cmH2O
PH ART: 7.387 (ref 7.350–7.450)
Patient temperature: 98.6
RATE: 15 resp/min
VT: 450 mL
pO2, Arterial: 95.4 mmHg (ref 83.0–108.0)

## 2017-04-23 LAB — COMPREHENSIVE METABOLIC PANEL
ALBUMIN: 2.3 g/dL — AB (ref 3.5–5.0)
ALK PHOS: 62 U/L (ref 38–126)
ALT: 22 U/L (ref 17–63)
ANION GAP: 8 (ref 5–15)
AST: 19 U/L (ref 15–41)
BILIRUBIN TOTAL: 0.7 mg/dL (ref 0.3–1.2)
BUN: 31 mg/dL — ABNORMAL HIGH (ref 6–20)
CALCIUM: 8.5 mg/dL — AB (ref 8.9–10.3)
CO2: 33 mmol/L — ABNORMAL HIGH (ref 22–32)
CREATININE: 0.84 mg/dL (ref 0.61–1.24)
Chloride: 88 mmol/L — ABNORMAL LOW (ref 101–111)
GFR calc Af Amer: >60 mL/min (ref >60)
GFR calc non Af Amer: >60 mL/min (ref >60)
GLUCOSE: 140 mg/dL — AB (ref 65–99)
Potassium: 3.3 mmol/L — ABNORMAL LOW (ref 3.5–5.1)
Sodium: 129 mmol/L — ABNORMAL LOW (ref 135–145)
TOTAL PROTEIN: 6.4 g/dL — AB (ref 6.5–8.1)

## 2017-04-23 LAB — CBC WITH DIFFERENTIAL/PLATELET
Basophils Absolute: 0 10*3/uL (ref 0.0–0.1)
Basophils Relative: 0 %
Eosinophils Absolute: 0.4 10*3/uL (ref 0.0–0.7)
Eosinophils Relative: 2 %
HEMATOCRIT: 34.3 % — AB (ref 39.0–52.0)
HEMOGLOBIN: 11 g/dL — AB (ref 13.0–17.0)
LYMPHS ABS: 1.3 10*3/uL (ref 0.7–4.0)
LYMPHS PCT: 7 %
MCH: 28.4 pg (ref 26.0–34.0)
MCHC: 32.1 g/dL (ref 30.0–36.0)
MCV: 88.4 fL (ref 78.0–100.0)
MONOS PCT: 8 %
Monocytes Absolute: 1.3 10*3/uL — ABNORMAL HIGH (ref 0.1–1.0)
NEUTROS ABS: 14.6 10*3/uL — AB (ref 1.7–7.7)
NEUTROS PCT: 83 %
Platelets: 567 10*3/uL — ABNORMAL HIGH (ref 150–400)
RBC: 3.88 MIL/uL — ABNORMAL LOW (ref 4.22–5.81)
RDW: 14.9 % (ref 11.5–15.5)
WBC: 17.7 10*3/uL — ABNORMAL HIGH (ref 4.0–10.5)

## 2017-04-23 LAB — GLUCOSE, CAPILLARY
GLUCOSE-CAPILLARY: 127 mg/dL — AB (ref 65–99)
GLUCOSE-CAPILLARY: 127 mg/dL — AB (ref 65–99)
GLUCOSE-CAPILLARY: 128 mg/dL — AB (ref 65–99)
GLUCOSE-CAPILLARY: 138 mg/dL — AB (ref 65–99)
Glucose-Capillary: 123 mg/dL — ABNORMAL HIGH (ref 65–99)
Glucose-Capillary: 143 mg/dL — ABNORMAL HIGH (ref 65–99)

## 2017-04-23 LAB — COOXEMETRY PANEL
Carboxyhemoglobin: 1.1 % (ref 0.5–1.5)
Methemoglobin: 1.1 % (ref 0.0–1.5)
O2 SAT: 68.3 %
TOTAL HEMOGLOBIN: 11.7 g/dL — AB (ref 12.0–16.0)

## 2017-04-23 LAB — PREALBUMIN: Prealbumin: <5 mg/dL — ABNORMAL LOW (ref 18–38)

## 2017-04-23 LAB — MAGNESIUM: Magnesium: 2.2 mg/dL (ref 1.7–2.4)

## 2017-04-23 MED ORDER — QUETIAPINE FUMARATE 50 MG PO TABS
50.0000 mg | ORAL_TABLET | Freq: Two times a day (BID) | ORAL | Status: DC
  Administered 2017-04-23 – 2017-04-27 (×8): 50 mg
  Filled 2017-04-23 (×8): qty 1

## 2017-04-23 MED ORDER — FUROSEMIDE 10 MG/ML IJ SOLN
40.0000 mg | Freq: Once | INTRAMUSCULAR | Status: AC
  Administered 2017-04-23: 40 mg via INTRAVENOUS
  Filled 2017-04-23: qty 4

## 2017-04-23 MED ORDER — POTASSIUM CHLORIDE 10 MEQ/50ML IV SOLN
10.0000 meq | INTRAVENOUS | Status: AC
  Administered 2017-04-23 (×3): 10 meq via INTRAVENOUS
  Filled 2017-04-23 (×3): qty 50

## 2017-04-23 MED ORDER — POTASSIUM CHLORIDE 10 MEQ/50ML IV SOLN
10.0000 meq | INTRAVENOUS | Status: AC
  Administered 2017-04-23 (×3): 10 meq via INTRAVENOUS
  Filled 2017-04-23 (×2): qty 50

## 2017-04-23 MED ORDER — POTASSIUM CHLORIDE 10 MEQ/50ML IV SOLN
10.0000 meq | INTRAVENOUS | Status: AC
  Administered 2017-04-23 (×3): 10 meq via INTRAVENOUS
  Filled 2017-04-23 (×4): qty 50

## 2017-04-23 NOTE — Progress Notes (Signed)
PULMONARY / CRITICAL CARE MEDICINE   Name: [name] MRN: [id number] DOB: [birth year]    ADMISSION DATE:  04/11/2017 CONSULTATION DATE:  04/11/17  REFERRING MD:  Eldridge DaceVaranasi  CHIEF COMPLAINT:  STEMI  HISTORY OF PRESENT ILLNESS:  Ronald HoveGary [name] is a 66 y.o. male with PMH of CAD and HTN.  [facility] with sudden onset of SOB and chest pain.  He was found to have inferolateral STEMI and was taken urgently to cath lab.  Prior to cath lab, he was intubated; therefore, PCCM asked to see in consultation to assist with vent management.  In cath lab, he was found to have 100% stenosed mid LAD lesion that was balloon angioplastied with 25% residual stenosis, distal RCA lesion which had DES placed, Ost 1st diag lesion 50% stenosed, proximal ramus 70% stenosed.  IABP was also placed. Has had ongoing shock, pressor dependent.   SUBJECTIVE:   Less agitated on 04/23/2017.  Continues to improve with aggressive diuresis. VITAL SIGNS: BP 120/63   Pulse 82   Temp (!) 101.1 F (38.4 C)   Resp (!) 34   Ht 5\' 7"  (1.702 m)   Wt 194 lb 10.7 oz (88.3 kg)   SpO2 96%   BMI 30.49 kg/m   HEMODYNAMICS: CVP:  [2 mmHg-13 mmHg] 9 mmHg  VENTILATOR SETTINGS: Vent Mode: CPAP;PSV FiO2 (%):  [30 %-40 %] 30 % Set Rate:  [15 bmp] 15 bmp Vt Set:  [450 mL] 450 mL PEEP:  [5 cmH20] 5 cmH20 Pressure Support:  [14 cmH20] 14 cmH20 Plateau Pressure:  [16 cmH20-27 cmH20] 18 cmH20  INTAKE / OUTPUT:  Intake/Output Summary (Last 24 hours) at 04/23/2017 1113 Last data filed at 04/23/2017 1015 Gross per 24 hour  Intake 4068.96 ml  Output 6235 ml  Net -2166.04 ml   Filed Weights   04/21/17 0426 04/22/17 0500 04/23/17 0500  Weight: 204 lb 2.3 oz (92.6 kg) 197 lb 5 oz (89.5 kg) 194 lb 10.7 oz (88.3 kg)    PHYSICAL EXAMINATION:   General: Elderly male currently on full vent support HEENT: Endotracheal tube connected to ventilator PSY: Confused Neuro: Moves all extremities x4 CV: Normal sinus rhythm  with a ventricular rate of 82 PULM: Currently on 30% oxygen via endotracheal tube pressure support of 14 with 5 of PEEP generating O2 saturations of 96%. BJ:YNWGGI:soft, non-tender, bsx4 active  Extremities: warm/dry, positive  edema  Skin: no rashes or lesions   LABS:  BMET Recent Labs  Lab 04/21/17 0333 04/21/17 1810 04/22/17 0355 04/23/17 0416  NA 131* 130* 130* 129*  K 3.2* 3.7 3.5 3.3*  CL 82* 80* 82* 88*  CO2 38*  --  37* 33*  BUN 32* 30* 32* 31*  CREATININE 1.00 0.90 0.99 0.84  GLUCOSE 133* 115* 132* 140*   Electrolytes Recent Labs  Lab 04/19/17 0344  04/20/17 0420 04/21/17 0333 04/22/17 0355 04/23/17 0416  CALCIUM 7.9*   < > 8.1* 8.6* 8.8* 8.5*  MG 1.9  --  2.1 2.1  --  2.2  PHOS 2.3*  --  3.5 3.1  --   --    < > = values in this interval not displayed.   CBC Recent Labs  Lab 04/20/17 0420 04/21/17 0333 04/21/17 1810 04/23/17 0416  WBC 13.1* 13.5*  --  17.7*  HGB 10.8* 10.8* 11.6* 11.0*  HCT 34.7* 34.0* 34.0* 34.3*  PLT 375 426*  --  567*   Coag's Recent Labs  Lab 04/17/17 0504 04/17/17 1650 04/18/17  0445  APTT 68* 44* 37*  INR  --   --  1.27   Sepsis Markers No results for input(s): LATICACIDVEN, PROCALCITON, O2SATVEN in the last 168 hours. ABG Recent Labs  Lab 04/21/17 0350 04/22/17 0334 04/23/17 0450  PHART 7.509* 7.452* 7.387  PCO2ART 51.1* 56.0* 58.2*  PO2ART 68.9* 77.3* 95.4   Liver Enzymes Recent Labs  Lab 04/17/17 0504 04/19/17 0344 04/23/17 0416  AST 33 40 19  ALT 27 51 22  ALKPHOS 57 59 62  BILITOT 1.0 0.9 0.7  ALBUMIN 2.4* 2.4* 2.3*   Cardiac Enzymes No results for input(s): TROPONINI, PROBNP in the last 168 hours. Glucose Recent Labs  Lab 04/22/17 0823 04/22/17 1206 04/22/17 1518 04/22/17 2026 04/23/17 0005 04/23/17 0826  GLUCAP 150* 117* 137* 117* 127* 123*    Imaging Dg Chest Port 1 View  Result Date: 04/23/2017 CLINICAL DATA:  Respiratory failure. EXAM: PORTABLE CHEST 1 VIEW COMPARISON:  Radiographs of  April 21, 2017 and April 20, 2017. FINDINGS: Endotracheal and feeding tubes are unchanged in position. Stable cardiomegaly. Sternotomy wires are noted. Stable mild bibasilar atelectasis with mild pleural effusions is noted. Right-sided PICC line is unchanged in position. Stable position of right internal jugular catheter is noted. There appears to be a mild right apical pneumothorax, less than 10%, which is not significantly changed compared to prior exam. Bony thorax is unremarkable. IMPRESSION: Stable support apparatus. Stable bibasilar atelectasis with mild pleural effusions. Mild right apical pneumothorax is noted which is not significantly changed compared to prior exam. These results will be called to the ordering clinician or representative by the Radiologist Assistant, and communication documented in the PACS or zVision Dashboard. Electronically Signed   By: Lupita Raider, M.D.   On: 04/23/2017 08:24   STUDIES:  CXR 11/13 > possible PNA. Echo TEE 11/16 >     Right ventricle: Prior to chest closure, the RV size and systolic function appeared adequate. Following chest closure there was some reduction RV systolic function, but there was no RV enlargement or septal shift.  Mitral valve: The mitral valve leaflets were of normal thickness. There was good leaflet separation. There was normal leaflet coptation without prolapsing or flail leaflet segments. There was trace mitral insufficiency. The impella cannula did not interfere with the mitral valve apparatus.  Tricuspid valve: Trace regurgitation. The tricuspid valve regurgitation jet is central.   CULTURES: Blood 11/13 > 102 with E. coli Sputum 11/13 > negative Wound 11/17>> ecoli  ANTIBIOTICS: Ceftriaxone 11/13 > 11/17 Azithromycin 11/13 > 11/17 vanc 11/17>>>11/20 Zosyn 11/17>>>11/20 Meropenem 04/18/2017>>  SIGNIFICANT EVENTS: 11/13 > admit.  With resection of left ventricular aneurysm and placement of 5.0 Impella 04/17/2017  removal of clotted impella  pump     LINES/TUBES: ETT 11/13 >  R femoral sheath 11/13 >  R IJ CVL (bensimohn) 11/14>>>  DISCUSSION: 66 y.o. male admitted 11/13 with inferolateral STEMI.  He was intubated and was then taken to cath lab where he was found to have 100% stenosed mid LAD lesion that was balloon angioplastied with 25% residual stenosis, distal RCA lesion which had DES placed, Ost 1st diag lesion 50% stenosed, proximal ramus 70% stenosed.  IABP was also placed. On f/u echo had ?MV rupture - taken urgently to OR.  Had LV pseudoaneurysm rupture, repaired, impala placed.   ASSESSMENT / PLAN:  PULMONARY A: Acute respiratory failure - r/t STEMI ? PNA - bilateral patchiness - ?aspiration v edema  P:   Daily weaning on pressure support, 04/22/2017  tolerated pressure support of 12 after several hours. F/u CXR  F/u ABG [id number]-[id number] Continue aggressive diureses Abx as below  CARDIOVASCULAR A:  Inferolateral STEMI - s/p cath lab where he was found to have 100% stenosed mid LAD lesion that was balloon angioplastied with 25% residual stenosis, distal RCA lesion which had DES placed, Ost 1st diag lesion 50% stenosed, proximal ramus 70% stenosed.  IABP was also placed. LV pseudoaneurysm rupture - to OR emergently 11/14, chest closure 11/16 Hypotension - suspect sedation related (was normotensive on arrival to ICU but was somewhat agitated so got additional sedation and later became hypotensive). Hx HTN, CAD. P:  Cardiology/ CVTS managing. Epi off, weaning levophed as able but aggressive diuresis is requiring continued Levophed support Follow CVPs  Diuresing well on lasix gtt as below  zaroxolyn 10 mg PO Continue amiodarone  Anticoagulation per cards - bivalirudin, cangrelor   RENAL Lab Results  Component Value Date   CREATININE 0.84 04/23/2017   CREATININE 0.99 04/22/2017   CREATININE 0.90 04/21/2017    Intake/Output Summary (Last 24 hours) at 04/23/2017 1102 Last data filed  at 04/23/2017 1015 Gross per 24 hour  Intake 4068.96 ml  Output 6495 ml  Net -2426.04 ml   Recent Labs  Lab 04/21/17 1810 04/22/17 0355 04/23/17 0416  K 3.7 3.5 3.3*     A:   NAGMA. AKI - improving.  Good UOP P:   Lasix drip  5 mg/hr Zaroxolyn 5 mg daily Diamox 500 mg twice daily BMP in AM. Replace electrolytes as needed   GASTROINTESTINAL A:   GI prophylaxis. Nutrition. P:   SUP: Pantoprazole. TF per nutrition  HEMATOLOGIC Recent Labs    04/21/17 1810 04/23/17 0416  HGB 11.6* 11.0*    A:   VTE Prophylaxis. P:  Anticoagulation as above  CBC daily  INFECTIOUS A:   Possible PNA. P:   Broad spectrum abx as above  Follow cultures - neg to date  Trend wbc, fever curve  At day 5 of meropenem consider discontinuing at day 7  ENDOCRINE CBG (last 3)  Recent Labs    04/22/17 2026 04/23/17 0005 04/23/17 0826  GLUCAP 117* 127* 123*    A:   No acute issues. P:   Monitor glucose on chem   NEUROLOGIC A:   Sedation due to mechanical ventilation. P:   Sedation:  Precedex, fentanyl, PRN versed  RASS goal: 0 to -1. Daily WUA. Will increase precedex ceiling to 1.2 on 04/19/2017 Use as needed Versed Agitation continues to be a block towards liberation from ventilator.  04/23/2017 his agitation is decreased he is weaning.  Family updated: No family bedside 04/23/2017.  Continue diureses, PS trials, minimize sedation as able.  Interdisciplinary Family Meeting v Palliative Care Meeting:  Due by: 04/17/17.  cct 30 min  Brett CanalesSteve Sreeja Spies ACNP Ad[facility] PollackLe Bauer PCCM Pager [phone] till 1 pm If no answer page [phone] 04/23/2017, 11:02 AM

## 2017-04-23 NOTE — Progress Notes (Addendum)
Advanced Heart Failure Rounding Note   Subjective:    - S/P LV pseudoaneurysm repeat 11/14 Impella 5.0 Placed.  - Chest closed 11/16.  - Started on broad spectrum abx on 11/17 for fever and leukocytosis.  - Impella noted to have poor waveform am of 11/19. Thought to have possible clot.  Heparin restarted and pump pulled afternoon of 04/17/17.  Remains on vent. Tolerated 10 hours of SBT yesterday   Awakens and will follow commands but then gets agitated. Lasix gtt stopped yesterday for CVP 3.    Weaning again now. Remains on low-dose NE 5-10 depending on sedation. CO-ox 68% today. Weight down another 3 pounds. (29 pounds total post-op). Well below pre-op weight  CVP 8 this am   CXR stable with small effusions and samll R apical PTX (Personally reviewed)   On meropenem/vanc for ESBL on fluid culture  Objective:   Weight Range: 88.3 kg (194 lb 10.7 oz) Body mass index is 30.49 kg/m.   Vital Signs:   Temp:  [97.7 F (36.5 C)-101.3 F (38.5 C)] 101.1 F (38.4 C) (11/25 1000) Pulse Rate:  [66-91] 82 (11/25 1000) Resp:  [13-38] 34 (11/25 1000) BP: (104-165)/(52-90) 120/63 (11/25 1000) SpO2:  [94 %-100 %] 96 % (11/25 1000) Arterial Line BP: (90-180)/(41-102) 147/54 (11/25 1000) FiO2 (%):  [30 %-40 %] 30 % (11/25 0800) Weight:  [88.3 kg (194 lb 10.7 oz)] 88.3 kg (194 lb 10.7 oz) (11/25 0500) Last BM Date: 04/23/17  Weight change: Filed Weights   04/21/17 0426 04/22/17 0500 04/23/17 0500  Weight: 92.6 kg (204 lb 2.3 oz) 89.5 kg (197 lb 5 oz) 88.3 kg (194 lb 10.7 oz)   Intake/Output:   Intake/Output Summary (Last 24 hours) at 04/23/2017 1134 Last data filed at 04/23/2017 1015 Gross per 24 hour  Intake 4018.96 ml  Output 6235 ml  Net -2216.04 ml    Physical Exam   CVP 8 General: Intubated Awake on vent. Weaning. Follows commands but gets agitated at times  HEENT: normal x for ETT Neck: supple JVP 8 no carotid bruits Cor: PMI nondisplaced. RRR distant no murmur  appreciated  Lungs: mechanical BS otherwise clear  Decreased at bases Abdomen: soft NT/ND. No hepatosplenomegaly. No bruits or masses. Good BS Extremities: no cyanosis, clubbing, rash, tr-1+ edema + rectal tube and foley Neuro: Intubated. Awake on vent. Weaning. Follows commands but gets agitated   Telemetry   NSR 70s + PVC, personally reviewed.   EKG   N/A  Labs    CBC Recent Labs    04/21/17 0333 04/21/17 1810 04/23/17 0416  WBC 13.5*  --  17.7*  NEUTROABS 11.7*  --  14.6*  HGB 10.8* 11.6* 11.0*  HCT 34.0* 34.0* 34.3*  MCV 89.0  --  88.4  PLT 426*  --  638*   Basic Metabolic Panel Recent Labs    04/21/17 0333  04/22/17 0355 04/23/17 0416  NA 131*   < > 130* 129*  K 3.2*   < > 3.5 3.3*  CL 82*   < > 82* 88*  CO2 38*  --  37* 33*  GLUCOSE 133*   < > 132* 140*  BUN 32*   < > 32* 31*  CREATININE 1.00   < > 0.99 0.84  CALCIUM 8.6*  --  8.8* 8.5*  MG 2.1  --   --  2.2  PHOS 3.1  --   --   --    < > = values in this interval  not displayed.   Liver Function Tests Recent Labs    04/23/17 0416  AST 19  ALT 22  ALKPHOS 62  BILITOT 0.7  PROT 6.4*  ALBUMIN 2.3*   No results for input(s): LIPASE, AMYLASE in the last 72 hours. Cardiac Enzymes No results for input(s): CKTOTAL, CKMB, CKMBINDEX, TROPONINI in the last 72 hours.  BNP: BNP (last 3 results) No results for input(s): BNP in the last 8760 hours.  ProBNP (last 3 results) No results for input(s): PROBNP in the last 8760 hours.   D-Dimer No results for input(s): DDIMER in the last 72 hours. Hemoglobin A1C No results for input(s): HGBA1C in the last 72 hours. Fasting Lipid Panel No results for input(s): CHOL, HDL, LDLCALC, TRIG, CHOLHDL, LDLDIRECT in the last 72 hours. Thyroid Function Tests No results for input(s): TSH, T4TOTAL, T3FREE, THYROIDAB in the last 72 hours.  Invalid input(s): FREET3  Other results:   Imaging    Dg Chest Port 1 View  Result Date: 04/23/2017 CLINICAL DATA:   Respiratory failure. EXAM: PORTABLE CHEST 1 VIEW COMPARISON:  Radiographs of April 21, 2017 and April 20, 2017. FINDINGS: Endotracheal and feeding tubes are unchanged in position. Stable cardiomegaly. Sternotomy wires are noted. Stable mild bibasilar atelectasis with mild pleural effusions is noted. Right-sided PICC line is unchanged in position. Stable position of right internal jugular catheter is noted. There appears to be a mild right apical pneumothorax, less than 10%, which is not significantly changed compared to prior exam. Bony thorax is unremarkable. IMPRESSION: Stable support apparatus. Stable bibasilar atelectasis with mild pleural effusions. Mild right apical pneumothorax is noted which is not significantly changed compared to prior exam. These results will be called to the ordering clinician or representative by the Radiologist Assistant, and communication documented in the PACS or zVision Dashboard. Electronically Signed   By: Marijo Conception, M.D.   On: 04/23/2017 08:24     Medications:     Scheduled Medications: . acetaZOLAMIDE  500 mg Oral BID  . aspirin  81 mg Per Tube Daily  . atorvastatin  80 mg Per Tube q1800  . bisacodyl  10 mg Oral Daily   Or  . bisacodyl  10 mg Rectal Daily  . chlorhexidine gluconate (MEDLINE KIT)  15 mL Mouth Rinse BID  . Chlorhexidine Gluconate Cloth  6 each Topical Daily  . enoxaparin (LOVENOX) injection  40 mg Subcutaneous Q24H  . feeding supplement (PRO-STAT SUGAR FREE 64)  30 mL Per Tube BID  . fentaNYL (SUBLIMAZE) injection  50 mcg Intravenous Once  . insulin aspart  0-24 Units Subcutaneous Q4H  . mouth rinse  15 mL Mouth Rinse 10 times per day  . metolazone  5 mg Oral Daily  . pantoprazole sodium  40 mg Per Tube Daily  . potassium chloride  40 mEq Per Tube TID  . sodium chloride flush  10-40 mL Intracatheter Q12H  . sodium chloride flush  10-40 mL Intracatheter Q12H  . sodium chloride flush  3 mL Intravenous Q12H  . ticagrelor  90 mg  Per Tube BID    Infusions: . sodium chloride Stopped (04/17/17 2130)  . sodium chloride    . sodium chloride 10 mL/hr at 04/23/17 1015  . sodium chloride    . amiodarone 30 mg/hr (04/23/17 1015)  . dexmedetomidine (PRECEDEX) IV infusion 1.2 mcg/kg/hr (04/23/17 1015)  . feeding supplement (VITAL HIGH PROTEIN) 1,000 mL (04/23/17 1015)  . fentaNYL infusion INTRAVENOUS 250 mcg/hr (04/23/17 1015)  . furosemide (LASIX) infusion Stopped (  04/22/17 1646)  . lactated ringers    . lactated ringers 10 mL/hr at 04/23/17 1015  . lactated ringers Stopped (04/17/17 2133)  . meropenem (MERREM) IV Stopped (04/23/17 0645)  . norepinephrine (LEVOPHED) Adult infusion 7 mcg/min (04/23/17 1117)  . potassium chloride    . potassium chloride 10 mEq (04/23/17 1110)    PRN Medications: sodium chloride, acetaminophen (TYLENOL) oral liquid 160 mg/5 mL, albuterol, fentaNYL, Gerhardt's butt cream, lactated ringers, midazolam, ondansetron (ZOFRAN) IV, sodium chloride flush, sodium chloride flush, sodium chloride flush    Patient Profile   [name] is 66 year old with h/o MI, HTN, COPD admitted with chest pain. EKG with evidence of inferior/lateral STEMI. Due to lethargy and hypoxemia he was intubated in the ED.  Taken to cath lab with RCA DES and POBA to chronically occluded LAD with IABP placed.   Had LV pseudo aneurysm repair 11/14 .   Assessment/Plan   1. CAD with OOH anterior MI c/b LV pseudoaneurysm and cardiogenic shock - attempted PCI of LAD failed due to chronic occlusion and extensive infarct - s/p PCI/DES to distal RCA Remains on ASA, brilinta, atorvastatin - stable no s/s ischemia  2.  S/P LV pseudoaneurysm repair 11/14 - Chest closed 04/13/17. Stable.   3. Acute systolic HF-> cardiogenic shock - Impella removed 04/17/17 - EF improved 25%->40% on recent intra-op TEE - Remains on low-dose NE for BP support. Co-ox stable at 68% - Volume status much improved. CVP was 3 yesterday now up to 8.  Will give lasix 40 IV today  4. Acute Hypoxic Respiratory Failure  - Intubated 11/13.  - Has severe COPD. Baseline PCO2 60s  - CXR stable (viewed personally). Vent wean per CCM d/w them personally.  - He tolerated SBT 10 hours yesterday. Still on PS 14. Will turn down to 10 - Diurese as tolerated  5. AKI  - Due to ATN shock  - Resolved. Creatinine 0.84 today  6. COPD with chronic CO2 retention  - Stable. No wheeze.   7. ESBL E coli culture -04/15/17 ESBL from skin culture (penile meatus) - Of vanc Day 6 of meropenem.   8. PAF - Maintaining NSR on amio gtt. Switch to po when extubated  9. Hypokalemia - will supp  CRITICAL CARE Performed by: Glori Bickers  Total critical care time: 35 minutes  Critical care time was exclusive of separately billable procedures and treating other patients.  Critical care was necessary to treat or prevent imminent or life-threatening deterioration.  Critical care was time spent personally by me (independent of midlevel providers or residents) on the following activities: development of treatment plan with patient and/or surrogate as well as nursing, discussions with consultants, evaluation of patient's response to treatment, examination of patient, obtaining history from patient or surrogate, ordering and performing treatments and interventions, ordering and review of laboratory studies, ordering and review of radiographic studies, pulse oximetry and re-evaluation of patient's condition.    Length of Stay: [place], MD  04/23/2017, 11:34 AM  Advanced Heart Failure Team Pager [phone] (M-F; 7a - 4p)   Please contact [facility] for night-coverage after hours (4p -7a ) and weekends on [email or web address]

## 2017-04-23 NOTE — Progress Notes (Signed)
[address]       [place] [id number]             [phone]        CARDIOTHORACIC SURGERY PROGRESS NOTE   R6 Days Post-Op Procedure(s) (LRB): REMOVAL OF IMPELLA LEFT VENTRICULAR ASSIST DEVICE (N/A)  Subjective: More alert and follows commands but still requiring sedation due to impulsive behavior, pulling at tubes.  Tolerated SBT x10 hours yesterday.  Objective: Vital signs: BP Readings from Last 1 Encounters:  04/23/17 (!) 161/58   Pulse Readings from Last 1 Encounters:  04/23/17 81   Resp Readings from Last 1 Encounters:  04/23/17 (!) 28   Temp Readings from Last 1 Encounters:  04/23/17 99.5 F (37.5 C)    Hemodynamics: CVP:  [2 mmHg-13 mmHg] 10 mmHg  Mixed venous co-ox 68%   Physical Exam:  Rhythm:   sinus  Breath sounds: Scattered rhonchi  Heart sounds:  RRR  Incisions:  Clean and dry  Abdomen:  Soft, non-distended, non-tender, tolerating feeds  Extremities:  Warm, well-perfused    Intake/Output from previous day: 11/24 0701 - 11/25 0700 In: 4210.4 [I.V.:2520.4; NG/GT:1490; IV Piggyback:200] Out: 57846695 [Urine:5795; Emesis/NG output:650; Stool:250] Intake/Output this shift: No intake/output data recorded.  Lab Results:  CBC: Recent Labs    04/21/17 0333 04/21/17 1810 04/23/17 0416  WBC 13.5*  --  17.7*  HGB 10.8* 11.6* 11.0*  HCT 34.0* 34.0* 34.3*  PLT 426*  --  567*    BMET:  Recent Labs    04/22/17 0355 04/23/17 0416  NA 130* 129*  K 3.5 3.3*  CL 82* 88*  CO2 37* 33*  GLUCOSE 132* 140*  BUN 32* 31*  CREATININE 0.99 0.84  CALCIUM 8.8* 8.5*     PT/INR:  No results for input(s): LABPROT, INR in the last 72 hours.  CBG (last 3)  Recent Labs    04/22/17 2026 04/23/17 0005 04/23/17 0826  GLUCAP 117* 127* 123*    ABG    Component Value Date/Time   PHART 7.387 04/23/2017 0450   PCO2ART 58.2 (H) 04/23/2017 0450   PO2ART 95.4 04/23/2017 0450   HCO3 34.2 (H) 04/23/2017 0450   TCO2 38 (H) 04/21/2017  1810   ACIDBASEDEF 4.0 (H) 04/13/2017 0357   O2SAT 96.9 04/23/2017 0450   O2SAT 68.3 04/23/2017 0450    CXR: PORTABLE CHEST 1 VIEW  COMPARISON:  Radiographs of April 21, 2017 and April 20, 2017.  FINDINGS: Endotracheal and feeding tubes are unchanged in position. Stable cardiomegaly. Sternotomy wires are noted. Stable mild bibasilar atelectasis with mild pleural effusions is noted. Right-sided PICC line is unchanged in position. Stable position of right internal jugular catheter is noted. There appears to be a mild right apical pneumothorax, less than 10%, which is not significantly changed compared to prior exam. Bony thorax is unremarkable.  IMPRESSION: Stable support apparatus. Stable bibasilar atelectasis with mild pleural effusions. Mild right apical pneumothorax is noted which is not significantly changed compared to prior exam. These results will be called to the ordering clinician or representative by the Radiologist Assistant, and communication documented in the PACS or zVision Dashboard.   Electronically Signed   By: Lupita RaiderJames  Green Jr, M.D.   On: 04/23/2017 08:24   Assessment/Plan: S/P Procedure(s) (LRB): REMOVAL OF IMPELLA LEFT VENTRICULAR ASSIST DEVICE (N/A)  Overall clinically stable Maintaining NSR w/ stable BP, mixed venous coox68%, currently on low dose levophed Oxygenation stable Lasix drip now off and CVP low, weight down  another3lbs, serum sodium trending down Hypokalemia, induced by loop diuretics, metabolic alkalosis Afebrile w/ slightly increased leukocytosis on IV meropenem   Supplement potassium   Lasix and pressors per HF team  Vent weaning and sedation per CCM team  Mobilize    Purcell Nailslarence H Owen, MD 04/23/2017 9:56 AM

## 2017-04-24 ENCOUNTER — Inpatient Hospital Stay (HOSPITAL_COMMUNITY): Payer: Medicare Other

## 2017-04-24 LAB — BLOOD GAS, ARTERIAL
Acid-Base Excess: 5.4 mmol/L — ABNORMAL HIGH (ref 0.0–2.0)
BICARBONATE: 30.3 mmol/L — AB (ref 20.0–28.0)
Drawn by: 23604
FIO2: 0.3
LHR: 15 {breaths}/min
O2 SAT: 98.1 %
PATIENT TEMPERATURE: 98.6
PCO2 ART: 52.5 mmHg — AB (ref 32.0–48.0)
PEEP/CPAP: 5 cmH2O
PH ART: 7.38 (ref 7.350–7.450)
VT: 450 mL
pO2, Arterial: 111 mmHg — ABNORMAL HIGH (ref 83.0–108.0)

## 2017-04-24 LAB — CBC WITH DIFFERENTIAL/PLATELET
BASOS PCT: 0 %
Basophils Absolute: 0 10*3/uL (ref 0.0–0.1)
EOS ABS: 0.5 10*3/uL (ref 0.0–0.7)
EOS PCT: 2 %
HCT: 34.3 % — ABNORMAL LOW (ref 39.0–52.0)
HEMOGLOBIN: 10.8 g/dL — AB (ref 13.0–17.0)
Lymphocytes Relative: 8 %
Lymphs Abs: 1.6 10*3/uL (ref 0.7–4.0)
MCH: 28.1 pg (ref 26.0–34.0)
MCHC: 31.5 g/dL (ref 30.0–36.0)
MCV: 89.1 fL (ref 78.0–100.0)
MONO ABS: 1.5 10*3/uL — AB (ref 0.1–1.0)
MONOS PCT: 8 %
NEUTROS ABS: 16.4 10*3/uL — AB (ref 1.7–7.7)
Neutrophils Relative %: 82 %
PLATELETS: 642 10*3/uL — AB (ref 150–400)
RBC: 3.85 MIL/uL — ABNORMAL LOW (ref 4.22–5.81)
RDW: 14.4 % (ref 11.5–15.5)
WBC: 20 10*3/uL — AB (ref 4.0–10.5)

## 2017-04-24 LAB — RENAL FUNCTION PANEL
ALBUMIN: 2.3 g/dL — AB (ref 3.5–5.0)
ANION GAP: 8 (ref 5–15)
BUN: 34 mg/dL — AB (ref 6–20)
CO2: 29 mmol/L (ref 22–32)
Calcium: 8.3 mg/dL — ABNORMAL LOW (ref 8.9–10.3)
Chloride: 93 mmol/L — ABNORMAL LOW (ref 101–111)
Creatinine, Ser: 0.8 mg/dL (ref 0.61–1.24)
Glucose, Bld: 139 mg/dL — ABNORMAL HIGH (ref 65–99)
PHOSPHORUS: 2.7 mg/dL (ref 2.5–4.6)
POTASSIUM: 3.8 mmol/L (ref 3.5–5.1)
Sodium: 130 mmol/L — ABNORMAL LOW (ref 135–145)

## 2017-04-24 LAB — COOXEMETRY PANEL
Carboxyhemoglobin: 1 % (ref 0.5–1.5)
Methemoglobin: 1 % (ref 0.0–1.5)
O2 Saturation: 72.8 %
Total hemoglobin: 10.9 g/dL — ABNORMAL LOW (ref 12.0–16.0)

## 2017-04-24 LAB — GLUCOSE, CAPILLARY
GLUCOSE-CAPILLARY: 143 mg/dL — AB (ref 65–99)
GLUCOSE-CAPILLARY: 156 mg/dL — AB (ref 65–99)
GLUCOSE-CAPILLARY: 108 mg/dL — AB (ref 65–99)
Glucose-Capillary: 144 mg/dL — ABNORMAL HIGH (ref 65–99)
Glucose-Capillary: 109 mg/dL — ABNORMAL HIGH (ref 65–99)
Glucose-Capillary: 130 mg/dL — ABNORMAL HIGH (ref 65–99)

## 2017-04-24 LAB — MAGNESIUM: MAGNESIUM: 2.3 mg/dL (ref 1.7–2.4)

## 2017-04-24 MED ORDER — ORAL CARE MOUTH RINSE
15.0000 mL | Freq: Two times a day (BID) | OROMUCOSAL | Status: DC
  Administered 2017-04-25 – 2017-04-27 (×5): 15 mL via OROMUCOSAL

## 2017-04-24 MED ORDER — ACETAMINOPHEN 160 MG/5ML PO SOLN
650.0000 mg | Freq: Four times a day (QID) | ORAL | Status: DC | PRN
  Administered 2017-04-25 – 2017-04-26 (×4): 650 mg
  Filled 2017-04-24 (×3): qty 20.3

## 2017-04-24 MED ORDER — SODIUM CHLORIDE 0.9 % IV SOLN
1.0000 mg/h | INTRAVENOUS | Status: DC
  Administered 2017-04-24: 1 mg/h via INTRAVENOUS
  Filled 2017-04-24: qty 10

## 2017-04-24 MED ORDER — FENTANYL CITRATE (PF) 100 MCG/2ML IJ SOLN
25.0000 ug | INTRAMUSCULAR | Status: DC | PRN
Start: 2017-04-24 — End: 2017-04-27
  Administered 2017-04-24: 50 ug via INTRAVENOUS
  Filled 2017-04-24: qty 2

## 2017-04-24 MED ORDER — FUROSEMIDE 10 MG/ML IJ SOLN
40.0000 mg | Freq: Once | INTRAMUSCULAR | Status: AC
  Administered 2017-04-24: 40 mg via INTRAVENOUS
  Filled 2017-04-24: qty 4

## 2017-04-24 MED ORDER — NOREPINEPHRINE BITARTRATE 1 MG/ML IV SOLN
0.0000 ug/min | INTRAVENOUS | Status: DC
  Administered 2017-04-24: 10 ug/min via INTRAVENOUS
  Administered 2017-04-25: 8 ug/min via INTRAVENOUS
  Filled 2017-04-24 (×2): qty 16

## 2017-04-24 NOTE — Progress Notes (Addendum)
[facility] DAILY ICU PROGRESS NOTE                   [address]            [place] [address]          [phone]   7 Days Post-Op Procedure(s) (LRB): REMOVAL OF IMPELLA LEFT VENTRICULAR ASSIST DEVICE (N/A)  Total Length of Stay:  LOS: 13 days   Subjective: Patient with continued agitation with ETT in place.  Just extubated at 12:00.. No specific complaints.  Objective: Vital signs in last 24 hours: Temp:  [98.1 F (36.7 C)-100 F (37.8 C)] 100 F (37.8 C) (11/26 1100) Pulse Rate:  [57-93] 93 (11/26 1100) Cardiac Rhythm: Normal sinus rhythm (11/26 0800) Resp:  [16-37] 19 (11/26 1100) BP: (96-199)/(53-116) 133/67 (11/26 1100) SpO2:  [95 %-100 %] 97 % (11/26 1100) Arterial Line BP: (81-241)/(39-86) 104/45 (11/26 1100) FiO2 (%):  [30 %-40 %] 30 % (11/26 0900) Weight:  [194 lb 10.7 oz (88.3 kg)] 194 lb 10.7 oz (88.3 kg) (11/26 0400)  Filed Weights   04/22/17 0500 04/23/17 0500 04/24/17 0400  Weight: 197 lb 5 oz (89.5 kg) 194 lb 10.7 oz (88.3 kg) 194 lb 10.7 oz (88.3 kg)    Weight change: 0 lb (0 kg)   Hemodynamic parameters for last 24 hours: CVP:  [2 mmHg-19 mmHg] 4 mmHg  Intake/Output from previous day: 11/25 0701 - 11/26 0700 In: 4150.1 [I.V.:2480.1; NG/GT:1420; IV Piggyback:250] Out: 7062 [Urine:3820; Emesis/NG output:360; Stool:210]  Intake/Output this shift: Total I/O In: 272.5 [I.V.:172.5; NG/GT:100] Out: 1175 [Urine:525; Emesis/NG output:650]  Current Meds: Scheduled Meds: . aspirin  81 mg Per Tube Daily  . atorvastatin  80 mg Per Tube q1800  . bisacodyl  10 mg Oral Daily   Or  . bisacodyl  10 mg Rectal Daily  . chlorhexidine gluconate (MEDLINE KIT)  15 mL Mouth Rinse BID  . Chlorhexidine Gluconate Cloth  6 each Topical Daily  . enoxaparin (LOVENOX) injection  40 mg Subcutaneous Q24H  . feeding supplement (PRO-STAT SUGAR FREE 64)  30 mL Per Tube BID  . fentaNYL (SUBLIMAZE) injection  50 mcg Intravenous Once  . insulin aspart  0-24 Units  Subcutaneous Q4H  . mouth rinse  15 mL Mouth Rinse 10 times per day  . pantoprazole sodium  40 mg Per Tube Daily  . potassium chloride  40 mEq Per Tube TID  . QUEtiapine  50 mg Per Tube BID  . sodium chloride flush  10-40 mL Intracatheter Q12H  . sodium chloride flush  10-40 mL Intracatheter Q12H  . sodium chloride flush  3 mL Intravenous Q12H  . ticagrelor  90 mg Per Tube BID   Continuous Infusions: . sodium chloride Stopped (04/17/17 2130)  . sodium chloride    . sodium chloride 10 mL/hr at 04/24/17 0522  . sodium chloride    . amiodarone 30 mg/hr (04/24/17 0522)  . feeding supplement (VITAL HIGH PROTEIN) 1,000 mL (04/24/17 0324)  . fentaNYL infusion INTRAVENOUS Stopped (04/24/17 1230)  . lactated ringers    . lactated ringers Stopped (04/23/17 1900)  . lactated ringers Stopped (04/17/17 2133)  . meropenem (MERREM) IV Stopped (04/24/17 0601)  . norepinephrine (LEVOPHED) Adult infusion 20 mcg/min (04/24/17 0843)   PRN Meds:.sodium chloride, acetaminophen (TYLENOL) oral liquid 160 mg/5 mL, albuterol, fentaNYL, Henritta Mutz's butt cream, lactated ringers, ondansetron (ZOFRAN) IV, sodium chloride flush, sodium chloride flush, sodium chloride flush  General appearance: alert and just extubated Heart: regular rate  and rhythm Lungs: coarse throughout Abdomen: soft, non-tender; bowel sounds normal; no masses,  no organomegaly Extremities: extremities normal, atraumatic, no cyanosis or edema Wound: Aquacel in place  Lab Results: CBC: Recent Labs    04/23/17 0416 04/24/17 0423  WBC 17.7* 20.0*  HGB 11.0* 10.8*  HCT 34.3* 34.3*  PLT 567* 642*   BMET:  Recent Labs    04/23/17 0416 04/24/17 0424  NA 129* 130*  K 3.3* 3.8  CL 88* 93*  CO2 33* 29  GLUCOSE 140* 139*  BUN 31* 34*  CREATININE 0.84 0.80  CALCIUM 8.5* 8.3*    CMET: Lab Results  Component Value Date   WBC 20.0 (H) 04/24/2017   HGB 10.8 (L) 04/24/2017   HCT 34.3 (L) 04/24/2017   PLT 642 (H) 04/24/2017    GLUCOSE 139 (H) 04/24/2017   CHOL 167 04/11/2017   TRIG 109 04/11/2017   HDL 27 (L) 04/11/2017   LDLCALC 118 (H) 04/11/2017   ALT 22 04/23/2017   AST 19 04/23/2017   NA 130 (L) 04/24/2017   K 3.8 04/24/2017   CL 93 (L) 04/24/2017   CREATININE 0.80 04/24/2017   BUN 34 (H) 04/24/2017   CO2 29 04/24/2017   INR 1.27 04/18/2017      PT/INR: No results for input(s): LABPROT, INR in the last 72 hours. Radiology: Dg Chest Port 1 View  Result Date: 04/24/2017 CLINICAL DATA:  66 year old male respiratory failure. Subsequent encounter. EXAM: PORTABLE CHEST 1 VIEW COMPARISON:  04/23/2017 chest x-ray. FINDINGS: Endotracheal tube tip 4.7 cm above the carina. Right central line tip proximal superior vena cava level. Right PICC line tip distal superior vena cava level. Feeding tube in place, seen to level of the gastric fundus, with tip not imaged. Cardiomegaly post median sternotomy. Small right apical pneumothorax (less than 10%) without change. Basilar consolidation may represent pleural effusions, atelectasis and less likely infiltrates. Pulmonary vascular congestion. Acromioclavicular joint degenerative changes. IMPRESSION: Overall no significant change since prior exam. Persistent small right apical pneumothorax (less than 10%). Basilar consolidation may represent pleural effusions and atelectasis (less likely infiltrates). Pulmonary vascular congestion. Cardiomegaly post median sternotomy. Electronically Signed   By: Genia Del M.D.   On: 04/24/2017 07:49     Assessment/Plan: S/P Procedure(s) (LRB): REMOVAL OF IMPELLA LEFT VENTRICULAR ASSIST DEVICE (N/A)  1. CV- Acute on Systolic HF, previous PAF, currently NSR- AHF managing, currently on Amiodarone, Levophed drip 2. Pulm- just extubated, per CCM notes will trial extubate today, if patient fails will plan to re-intubate with plans to place trach 3. Renal- creatinine has been stable, diuretics per AHF 4. ID- ESBL, E.Coli- on IV Meropenem,  continued Leukocytosis, low grade temp 5. Dispo- patient just extubated- per CCM, remains on Levophed, Amiodarone per AHF, continue ABX, continue current care     [name] 04/24/2017 12:50 PM   Now extubated  I have seen and examined [name] and agree with the above assessment  and plan.  Grace Isaac MD Beeper [phone] Office [phone] 04/24/2017 1:47 PM

## 2017-04-24 NOTE — Progress Notes (Signed)
Patient ID: [name], male   DOB: [birth year], 66 y.o.   MRN: [id number] EVENING ROUNDS NOTE :     [address]       [place] [id number]             [phone]                 7 Days Post-Op Procedure(s) (LRB): REMOVAL OF IMPELLA LEFT VENTRICULAR ASSIST DEVICE (N/A)  Total Length of Stay:  LOS: 13 days  BP 126/77   Pulse 84   Temp (!) 101.3 F (38.5 C)   Resp (!) 27   Ht 5\' 7"  (1.702 m)   Wt 194 lb 10.7 oz (88.3 kg)   SpO2 98%   BMI 30.49 kg/m   .Intake/Output      11/25 0701 - 11/26 0700 11/26 0701 - 11/27 0700   I.V. (mL/kg) 2480.1 (28.1) 625.8 (7.1)   NG/GT 1420 105.8   IV Piggyback 250    Total Intake(mL/kg) 4150.1 (47) 731.7 (8.3)   Urine (mL/kg/hr) 3820 (1.8) 2525 (2.4)   Emesis/NG output 360 650   Stool 210    Total Output 4390 3175   Net -239.9 -2443.3          . sodium chloride Stopped (04/17/17 2130)  . sodium chloride    . sodium chloride 10 mL/hr at 04/24/17 0522  . sodium chloride    . amiodarone 30 mg/hr (04/24/17 1732)  . feeding supplement (VITAL HIGH PROTEIN) 1,000 mL (04/24/17 1653)  . fentaNYL infusion INTRAVENOUS Stopped (04/24/17 1230)  . lactated ringers    . lactated ringers Stopped (04/23/17 1900)  . lactated ringers Stopped (04/17/17 2133)  . meropenem (MERREM) IV Stopped (04/24/17 1448)  . norepinephrine (LEVOPHED) Adult infusion 12 mcg/min (04/24/17 1838)     Lab Results  Component Value Date   WBC 20.0 (H) 04/24/2017   HGB 10.8 (L) 04/24/2017   HCT 34.3 (L) 04/24/2017   PLT 642 (H) 04/24/2017   GLUCOSE 139 (H) 04/24/2017   CHOL 167 04/11/2017   TRIG 109 04/11/2017   HDL 27 (L) 04/11/2017   LDLCALC 118 (H) 04/11/2017   ALT 22 04/23/2017   AST 19 04/23/2017   NA 130 (L) 04/24/2017   K 3.8 04/24/2017   CL 93 (L) 04/24/2017   CREATININE 0.80 04/24/2017   BUN 34 (H) 04/24/2017   CO2 29 04/24/2017   INR 1.27 04/18/2017   Stable off vent, Tolerating extubation well Knows the names of all the MD's  seeing him. Will need PT involvement   Delight Ovensdward B Dajohn Ellender MD  Beeper [phone] Office [phone] 04/24/2017 6:49 PM

## 2017-04-24 NOTE — Progress Notes (Signed)
PULMONARY / CRITICAL CARE MEDICINE   Name: [name] MRN: [id number] DOB: [birth year]    ADMISSION DATE:  04/11/2017 CONSULTATION DATE:  04/11/17  REFERRING MD:  Eldridge Dace  CHIEF COMPLAINT:  STEMI  HISTORY OF PRESENT ILLNESS:  [name] is a 66 y.o. male with PMH of CAD and HTN.  He presented to [facility] ED 11/13 with sudden onset of SOB and chest pain.  He was found to have inferolateral STEMI and was taken urgently to cath lab.  Prior to cath lab, he was intubated; therefore, PCCM asked to see in consultation to assist with vent management.  In cath lab, he was found to have 100% stenosed mid LAD lesion that was balloon angioplastied with 25% residual stenosis, distal RCA lesion which had DES placed, Ost 1st diag lesion 50% stenosed, proximal ramus 70% stenosed.  IABP was also placed. Has had ongoing shock, pressor dependent.   SUBJECTIVE:  No acute events overnight. Weaning well this AM with adjusted sedation.   VITAL SIGNS: BP (!) 174/88   Pulse 88   Temp 99.3 F (37.4 C)   Resp (!) 33   Ht 5\' 7"  (1.702 m)   Wt 88.3 kg (194 lb 10.7 oz)   SpO2 100%   BMI 30.49 kg/m   HEMODYNAMICS: CVP:  [2 mmHg-19 mmHg] 4 mmHg  VENTILATOR SETTINGS: Vent Mode: CPAP;PSV FiO2 (%):  [30 %-40 %] 30 % Set Rate:  [15 bmp] 15 bmp Vt Set:  [450 mL] 450 mL PEEP:  [5 cmH20] 5 cmH20 Pressure Support:  [10 cmH20-12 cmH20] 10 cmH20 Plateau Pressure:  [15 cmH20-17 cmH20] 15 cmH20  INTAKE / OUTPUT:  Intake/Output Summary (Last 24 hours) at 04/24/2017 0926 Last data filed at 04/24/2017 0900 Gross per 24 hour  Intake 4422.67 ml  Output 4390 ml  Net 32.67 ml   Filed Weights   04/22/17 0500 04/23/17 0500 04/24/17 0400  Weight: 89.5 kg (197 lb 5 oz) 88.3 kg (194 lb 10.7 oz) 88.3 kg (194 lb 10.7 oz)    PHYSICAL EXAMINATION:  General: Elderly male of normal body habitus on vent HEENT: [place]/AT, PERRL, no appreciable JVD PSY: awake, alert, follows commands. Agitated at times.  Neuro: Moves all  extremities x4 CV: RRR, no MRG PULM: Coarse bilateral breath sounds. Tol wean 10/5 currently.  WU:JWJX, non-tender, bsx4 active  Extremities: warm/dry, positive  edema  Skin: no rashes or lesions   LABS:  BMET Recent Labs  Lab 04/22/17 0355 04/23/17 0416 04/24/17 0424  NA 130* 129* 130*  K 3.5 3.3* 3.8  CL 82* 88* 93*  CO2 37* 33* 29  BUN 32* 31* 34*  CREATININE 0.99 0.84 0.80  GLUCOSE 132* 140* 139*   Electrolytes Recent Labs  Lab 04/20/17 0420 04/21/17 0333 04/22/17 0355 04/23/17 0416 04/24/17 0424  CALCIUM 8.1* 8.6* 8.8* 8.5* 8.3*  MG 2.1 2.1  --  2.2 2.3  PHOS 3.5 3.1  --   --  2.7   CBC Recent Labs  Lab 04/21/17 0333 04/21/17 1810 04/23/17 0416 04/24/17 0423  WBC 13.5*  --  17.7* 20.0*  HGB 10.8* 11.6* 11.0* 10.8*  HCT 34.0* 34.0* 34.3* 34.3*  PLT 426*  --  567* 642*   Coag's Recent Labs  Lab 04/17/17 1650 04/18/17 0445  APTT 44* 37*  INR  --  1.27   Sepsis Markers No results for input(s): LATICACIDVEN, PROCALCITON, O2SATVEN in the last 168 hours. ABG Recent Labs  Lab 04/22/17 0334 04/23/17 0450 04/24/17 0620  PHART 7.452* 7.387 7.380  PCO2ART 56.0* 58.2* 52.5*  PO2ART 77.3* 95.4 111*   Liver Enzymes Recent Labs  Lab 04/19/17 0344 04/23/17 0416 04/24/17 0424  AST 40 19  --   ALT 51 22  --   ALKPHOS 59 62  --   BILITOT 0.9 0.7  --   ALBUMIN 2.4* 2.3* 2.3*   Cardiac Enzymes No results for input(s): TROPONINI, PROBNP in the last 168 hours. Glucose Recent Labs  Lab 04/23/17 0826 04/23/17 1159 04/23/17 1559 04/23/17 1953 04/23/17 2346 04/24/17 0431  GLUCAP 123* 138* 128* 127* 143* 156*    Imaging Dg Chest Port 1 View  Result Date: 04/24/2017 CLINICAL DATA:  66 year old male respiratory failure. Subsequent encounter. EXAM: PORTABLE CHEST 1 VIEW COMPARISON:  04/23/2017 chest x-ray. FINDINGS: Endotracheal tube tip 4.7 cm above the carina. Right central line tip proximal superior vena cava level. Right PICC line tip distal  superior vena cava level. Feeding tube in place, seen to level of the gastric fundus, with tip not imaged. Cardiomegaly post median sternotomy. Small right apical pneumothorax (less than 10%) without change. Basilar consolidation may represent pleural effusions, atelectasis and less likely infiltrates. Pulmonary vascular congestion. Acromioclavicular joint degenerative changes. IMPRESSION: Overall no significant change since prior exam. Persistent small right apical pneumothorax (less than 10%). Basilar consolidation may represent pleural effusions and atelectasis (less likely infiltrates). Pulmonary vascular congestion. Cardiomegaly post median sternotomy. Electronically Signed   By: Lacy DuverneySteven  Olson M.D.   On: 04/24/2017 07:49   STUDIES:  CXR 11/13 > possible PNA. Echo TEE 11/16 >     Right ventricle: Prior to chest closure, the RV size and systolic function appeared adequate. Following chest closure there was some reduction RV systolic function, but there was no RV enlargement or septal shift.  Mitral valve: The mitral valve leaflets were of normal thickness. There was good leaflet separation. There was normal leaflet coptation without prolapsing or flail leaflet segments. There was trace mitral insufficiency. The impella cannula did not interfere with the mitral valve apparatus.  Tricuspid valve: Trace regurgitation. The tricuspid valve regurgitation jet is central.   CULTURES: Blood 11/13 > 102 with E. coli Sputum 11/13 > negative Wound 11/17>> ecoli  ANTIBIOTICS: Ceftriaxone 11/13 > 11/17 Azithromycin 11/13 > 11/17 vanc 11/17>>>11/20 Zosyn 11/17>>>11/20 Meropenem 04/18/2017>>  SIGNIFICANT EVENTS: 11/13 > admit.  With resection of left ventricular aneurysm and placement of 5.0 Impella 04/17/2017 removal of clotted impella  pump   LINES/TUBES: ETT 11/13 >  R femoral sheath 11/13 >  R IJ CVL (bensimohn) 11/14>>>  DISCUSSION: 66 y.o. male admitted 11/13 with inferolateral STEMI.   He was intubated and was then taken to cath lab where he was found to have 100% stenosed mid LAD lesion that was balloon angioplastied with 25% residual stenosis, distal RCA lesion which had DES placed, Ost 1st diag lesion 50% stenosed, proximal ramus 70% stenosed.  IABP was also placed. On f/u echo had ?MV rupture - taken urgently to OR.  Had LV pseudoaneurysm rupture, repaired, impala placed.   ASSESSMENT / PLAN:  PULMONARY A: Acute respiratory failure - r/t STEMI ? PNA - bilateral patchiness - ?aspiration v edema  P:   Daily weaning on pressure support Hopeful for extubation today, agitation/anxiety seems to be limiting factor.  Diuresis per advanced CHF team (40 IV lasix this AM) Abx as below Agitation management as below.   CARDIOVASCULAR A:  Inferolateral STEMI - s/p cath lab where he was found to have 100% stenosed mid LAD lesion that was balloon angioplastied with  25% residual stenosis, distal RCA lesion which had DES placed, Ost 1st diag lesion 50% stenosed, proximal ramus 70% stenosed.  IABP was also placed. LV pseudoaneurysm rupture - to OR emergently 11/14, chest closure 11/16 Hypotension - suspect sedation related (was normotensive on arrival to ICU but was somewhat agitated so got additional sedation and later became hypotensive). Hx HTN, CAD. P:  Cardiology/CVTS managing Titrate levophed for MAP > 65 mmHg Diuresis per CHF team Continue amiodarone  Anticoagulation per cards - bivalirudin, cangrelor   RENAL A:   Hyponatremia > improving AKI - improving  Good UOP  P:   Follow BMP Careful diuresis  GASTROINTESTINAL A:   GI prophylaxis Nutrition  P:   SUP: Pantoprazole. TF per nutrition, on hold for potential extubation.   HEMATOLOGIC A:   VTE Prophylaxis. P:  Anticoagulation as above  CBC daily  INFECTIOUS A:   Possible PNA. Perineal wound infection E.coli > sens to imipenem.  P:   Broad spectrum abx as above  Follow cultures - neg to date   Trend wbc, fever curve  At day 6 of meropenem consider discontinuing at day 7  ENDOCRINE A:   No acute issues. P:   Monitor glucose on chem   NEUROLOGIC A:   Sedation due to mechanical ventilation. Agitation/anxiety > improving P:   Sedation:  Precdex now added versed gtt low dose. Seroquel per tube. Trying to optimize for extubation. RASS goal: 0  Daily WUA. QTC monitoring    Family updated: family updated (sister and sig other). Hopeful for extubation. Would want re-intubation and tracheostomy if fails/indicated.   Joneen RoachPaul Hoffman, AGACNP-BC [facility] Pager [phone] or [phone]  04/24/2017 9:59 AM  Attending Note:  66 year old male that remains on the vent post op that I believe is at dry weight at this point.  He is very agitated on exam with coarse BS.  I reviewed CXR myself, ETT ok.  Spoke with family, will trial extubate today.  If fails then will reintubate and trach.  Would like to eliminate the ETT as the source of agitation/respiratory failure.  Hold off diureses for now.  D/C sedation.  The patient is critically ill with multiple organ systems failure and requires high complexity decision making for assessment and support, frequent evaluation and titration of therapies, application of advanced monitoring technologies and extensive interpretation of multiple databases.   Critical Care Time devoted to patient care services described in this note is  35  Minutes. This time reflects time of care of this signee Dr Koren BoundWesam Inetta Dicke. This critical care time does not reflect procedure time, or teaching time or supervisory time of PA/NP/Med student/Med Resident etc but could involve care discussion time.  Alyson ReedyWesam G. Aisha Greenberger, M.D. [facility]. Pager: [phone]. After hours pager: [phone].

## 2017-04-24 NOTE — Plan of Care (Signed)
  Progressing Clinical Measurements: Respiratory complications will improve 04/24/2017 0144 - Progressing by Joycelyn ManHimes, Raymar Joiner M, RN Cardiovascular complication will be avoided 04/24/2017 0144 - Progressing by Joycelyn ManHimes, Romanita Fager M, RN Nutrition: Adequate nutrition will be maintained 04/24/2017 0144 - Progressing by Joycelyn ManHimes, Thekla Colborn M, RN Elimination: Will not experience complications related to bowel motility 04/24/2017 0144 - Progressing by Joycelyn ManHimes, Kyna Blahnik M, RN Will not experience complications related to urinary retention 04/24/2017 0144 - Progressing by Joycelyn ManHimes, Jaylianna Tatlock M, RN Pain Managment: General experience of comfort will improve 04/24/2017 0144 - Progressing by Joycelyn ManHimes, Berlyn Malina M, RN Respiratory: Respiratory status will improve 04/24/2017 0144 - Progressing by Joycelyn ManHimes, Rhia Blatchford M, RN Note Pt able to wean with decreased pressure support during the day.  Skin Integrity: Wound healing without signs and symptoms of infection 04/24/2017 0144 - Progressing by Joycelyn ManHimes, Aayla Marrocco M, RN Risk for impaired skin integrity will decrease 04/24/2017 0144 - Progressing by Joycelyn ManHimes, Ardean Simonich M, RN Urinary Elimination: Ability to achieve and maintain adequate renal perfusion and functioning will improve 04/24/2017 0144 - Progressing by Joycelyn ManHimes, Connery Shiffler M, RN   Not Progressing Activity: Risk for activity intolerance will decrease 04/24/2017 0144 - Not Progressing by Joycelyn ManHimes, Gayanne Prescott M, RN Note Activity inappropriate intervention at this time. Pt remains intubated and sedated.  Cardiac: Hemodynamic stability will improve 04/24/2017 0144 - Not Progressing by Joycelyn ManHimes, Riannah Stagner M, RN Note Still requiring IV pressors to help maintain BP.

## 2017-04-24 NOTE — Progress Notes (Signed)
eLink Physician-Brief Progress Note Patient Name: Ronald HoveGary Purtle DOB: 11-07-1950 MRN: [id number]   Date of Service  04/24/2017  HPI/Events of Note  Contacted by bedside nurse regarding chest wall pain. Status post extubation today. Camera shows patient resting comfortably. Saturating 97% on nasal cannula with mildly increased work of breathing.   eICU Interventions  1. Ordering fentanyl 25-50 micrograms IV every 2 hours when necessary severe pain  2. Tylenol via tube when necessary mild to moderate pain ordered      Intervention Category Intermediate Interventions: Pain - evaluation and management  [name] 04/24/2017, 9:20 PM

## 2017-04-24 NOTE — Progress Notes (Signed)
Nutrition Follow-up  DOCUMENTATION CODES:   Obesity unspecified  INTERVENTION:   -Await diet advancement post extubation, oral nutrition supplementation as needed  -If unable to advance diet within 24-48 hours, recommend resuming TF via Cortrak tube.  Tube Feeding recommendations:   Vital 1.5 @ 55 ml/hr with Pro-Stat 30 mL daily providing 105 g of protein, 2080 kcals, 1003 mL of free water.    NUTRITION DIAGNOSIS:   Inadequate oral intake related to acute illness, inability to eat as evidenced by NPO status.  Continues, awaiting diet advancement post extubation  GOAL:   Patient will meet greater than or equal to 90% of their needs  MONITOR:   Diet advancement, Labs, Weight trends  REASON FOR ASSESSMENT:   Ventilator    ASSESSMENT:   66 yo male admitted with sunset onset of SOB and chest pain, found to have inferolateral STEMI and taken to cath lab (distal RCA lesion with DES placed, 100% stenosed mid LAD lesion that was balloon angioplastied, IABP placed). Pt intubated prior to cardiac cath. Pt found to have LV aneurysm on 11/14 and taking emergently to OR. Pt with hx of CAD, HTN   11/19 Impella removed 11/26 Extubated this AM, TF held for extubation, Cortrak tube in place  Previously receiving Vital High Protein @ 50 ml/hr with Pro-Stat 30 mL BID  Net negative 6.8 L since admission per I/O flowsheet. Pt down 9.5 kg since admission. Noted no pressure ulcers/skin breakdown noted on RN skin assessment  Labs: sodium 130, Creatinine wdl Meds: reviewed  Diet Order:  Diet NPO time specified  EDUCATION NEEDS:   Not appropriate for education at this time  Skin:  Skin Assessment: Reviewed RN Assessment Skin Integrity Issues:: Incisions Incisions: chest  Last BM:  11/26  Height:   Ht Readings from Last 1 Encounters:  04/13/17 5\' 7"  (1.702 m)    Weight:   Wt Readings from Last 1 Encounters:  04/24/17 194 lb 10.7 oz (88.3 kg)    Ideal Body Weight:  67.3  kg  BMI:  Body mass index is 30.49 kg/m.  Estimated Nutritional Needs:   Kcal:  1995-2300 kcals  Protein:  100-115 g   Fluid:  >/= 2 L  Romelle Starcherate Keyshla Tunison MS, RD, LDN, CNSC [phone] Pager  [phone] Weekend/On-Call Pager

## 2017-04-24 NOTE — Procedures (Signed)
Extubation Procedure Note  Patient Details:   Name: Arlana HoveGary Lesueur DOB: [birth year] MRN: [id number]   Airway Documentation:     Evaluation  O2 sats: stable throughout Complications: No apparent complications Patient did tolerate procedure well. Bilateral Breath Sounds: Clear   No  PT was extubated to 3L [place]  PT sats are stable, PT was unable to speak.  RT to monitor   Xavier Fournier, Duane LopeJeffrey D 04/24/2017, 12:03 PM

## 2017-04-24 NOTE — Progress Notes (Signed)
Advanced Heart Failure Rounding Note   Subjective:    - S/P LV pseudoaneurysm repeat 11/14 Impella 5.0 Placed.  - Chest closed 11/16.  - Started on broad spectrum abx on 11/17 for fever and leukocytosis.  - Impella noted to have poor waveform am of 11/19. Thought to have possible clot.  Heparin restarted and pump pulled afternoon of 04/17/17.  Yesterday weaned over 12 hours.  On FiO2 30% PEEP 5. On meropenem for ESBL on fluid culture  Over night NE increased to 22 mcg. Weight stable. Off lasix gtt. Got 40 lasix x 1.   Remains on vent. Follows commands.   Objective:   Weight Range: 194 lb 10.7 oz (88.3 kg) Body mass index is 30.49 kg/m.   Vital Signs:   Temp:  [98.1 F (36.7 C)-101.1 F (38.4 C)] 98.8 F (37.1 C) (11/26 0700) Pulse Rate:  [57-99] 83 (11/26 0700) Resp:  [16-37] 23 (11/26 0700) BP: (102-199)/(53-88) 140/65 (11/26 0700) SpO2:  [95 %-100 %] 98 % (11/26 0700) Arterial Line BP: (86-241)/(39-82) 100/62 (11/26 0700) FiO2 (%):  [30 %-40 %] 30 % (11/26 0252) Weight:  [194 lb 10.7 oz (88.3 kg)] 194 lb 10.7 oz (88.3 kg) (11/26 0400) Last BM Date: 04/23/17(flexiseal)  Weight change: Filed Weights   04/22/17 0500 04/23/17 0500 04/24/17 0400  Weight: 197 lb 5 oz (89.5 kg) 194 lb 10.7 oz (88.3 kg) 194 lb 10.7 oz (88.3 kg)   Intake/Output:   Intake/Output Summary (Last 24 hours) at 04/24/2017 0727 Last data filed at 04/24/2017 0700 Gross per 24 hour  Intake 4150.14 ml  Output 4390 ml  Net -239.86 ml    Physical Exam   CVP 8 General:  On vent. Awake follows commands  Agitated at times  HEENT: normal except ETT Neck: supple. no JVD. Carotids 2+ bilat; no bruits. No lymphadenopathy or thryomegaly appreciated. RIJ TLC Cor: PMI nondisplaced. Regular rate & rhythm. No rubs, gallops or murmurs. Sternal dressing intact Lungs: Coarse throughout.  Abdomen: soft, nontender, nondistended. No hepatosplenomegaly. No bruits or masses. Good bowel sounds. Rectal  Tube Extremities: no cyanosis, clubbing, rash, edema. R and LLE SCDs.  LUE A line RUE PICC Neuro: intubated.Follows commands. MAE  GU; Foley.     Telemetry   NSR 70s + PVC, personally reviewed.   EKG   N/A  Labs    CBC Recent Labs    04/23/17 0416 04/24/17 0423  WBC 17.7* 20.0*  NEUTROABS 14.6* 16.4*  HGB 11.0* 10.8*  HCT 34.3* 34.3*  MCV 88.4 89.1  PLT 567* 035*   Basic Metabolic Panel Recent Labs    04/23/17 0416 04/24/17 0424  NA 129* 130*  K 3.3* 3.8  CL 88* 93*  CO2 33* 29  GLUCOSE 140* 139*  BUN 31* 34*  CREATININE 0.84 0.80  CALCIUM 8.5* 8.3*  MG 2.2 2.3  PHOS  --  2.7   Liver Function Tests Recent Labs    04/23/17 0416 04/24/17 0424  AST 19  --   ALT 22  --   ALKPHOS 62  --   BILITOT 0.7  --   PROT 6.4*  --   ALBUMIN 2.3* 2.3*   No results for input(s): LIPASE, AMYLASE in the last 72 hours. Cardiac Enzymes No results for input(s): CKTOTAL, CKMB, CKMBINDEX, TROPONINI in the last 72 hours.  BNP: BNP (last 3 results) No results for input(s): BNP in the last 8760 hours.  ProBNP (last 3 results) No results for input(s): PROBNP in the last 8760  hours.   D-Dimer No results for input(s): DDIMER in the last 72 hours. Hemoglobin A1C No results for input(s): HGBA1C in the last 72 hours. Fasting Lipid Panel No results for input(s): CHOL, HDL, LDLCALC, TRIG, CHOLHDL, LDLDIRECT in the last 72 hours. Thyroid Function Tests No results for input(s): TSH, T4TOTAL, T3FREE, THYROIDAB in the last 72 hours.  Invalid input(s): FREET3  Other results:   Imaging    No results found.   Medications:     Scheduled Medications: . acetaZOLAMIDE  500 mg Oral BID  . aspirin  81 mg Per Tube Daily  . atorvastatin  80 mg Per Tube q1800  . bisacodyl  10 mg Oral Daily   Or  . bisacodyl  10 mg Rectal Daily  . chlorhexidine gluconate (MEDLINE KIT)  15 mL Mouth Rinse BID  . Chlorhexidine Gluconate Cloth  6 each Topical Daily  . enoxaparin (LOVENOX)  injection  40 mg Subcutaneous Q24H  . feeding supplement (PRO-STAT SUGAR FREE 64)  30 mL Per Tube BID  . fentaNYL (SUBLIMAZE) injection  50 mcg Intravenous Once  . insulin aspart  0-24 Units Subcutaneous Q4H  . mouth rinse  15 mL Mouth Rinse 10 times per day  . metolazone  5 mg Oral Daily  . pantoprazole sodium  40 mg Per Tube Daily  . potassium chloride  40 mEq Per Tube TID  . QUEtiapine  50 mg Per Tube BID  . sodium chloride flush  10-40 mL Intracatheter Q12H  . sodium chloride flush  10-40 mL Intracatheter Q12H  . sodium chloride flush  3 mL Intravenous Q12H  . ticagrelor  90 mg Per Tube BID    Infusions: . sodium chloride Stopped (04/17/17 2130)  . sodium chloride    . sodium chloride 10 mL/hr at 04/24/17 0522  . sodium chloride    . amiodarone 30 mg/hr (04/24/17 0522)  . dexmedetomidine (PRECEDEX) IV infusion 1.2 mcg/kg/hr (04/24/17 5284)  . feeding supplement (VITAL HIGH PROTEIN) 1,000 mL (04/24/17 0324)  . fentaNYL infusion INTRAVENOUS 300 mcg/hr (04/24/17 0700)  . furosemide (LASIX) infusion Stopped (04/22/17 1646)  . lactated ringers    . lactated ringers Stopped (04/23/17 1900)  . lactated ringers Stopped (04/17/17 2133)  . meropenem (MERREM) IV Stopped (04/24/17 0601)  . norepinephrine (LEVOPHED) Adult infusion 22 mcg/min (04/24/17 0400)    PRN Medications: sodium chloride, acetaminophen (TYLENOL) oral liquid 160 mg/5 mL, albuterol, fentaNYL, Gerhardt's butt cream, lactated ringers, midazolam, ondansetron (ZOFRAN) IV, sodium chloride flush, sodium chloride flush, sodium chloride flush    Patient Profile   [name] is 66 year old with h/o MI, HTN, COPD admitted with chest pain. EKG with evidence of inferior/lateral STEMI. Due to lethargy and hypoxemia he was intubated in the ED.  Taken to cath lab with RCA DES and POBA to chronically occluded LAD with IABP placed.   Had LV pseudo aneurysm repair 11/14 .   Assessment/Plan   1. CAD with OOH anterior MI c/b LV  pseudoaneurysm and cardiogenic shock - attempted PCI of LAD failed due to chronic occlusion and extensive infarct - s/p PCI/DES to distal RCA Remains on ASA, brilinta, atorvastatin - stable no s/s ischemia  2.  S/P LV pseudoaneurysm repair 11/14 - Chest closed 04/13/17. Stable.   3. Acute systolic HF-> cardiogenic shock - Impella removed 04/17/17 - EF improved 25%->40% on recent intra-op TEE - NE increased to 22 mcg. CO-OX 72%.  -Volume status stable. CVP 8. Give 40 mg IV lasix x1.  - Stop  diamox. CO2 down 37>33>29  4. Acute Hypoxic Respiratory Failure  - Intubated 11/13.  - Has severe COPD. Baseline PCO2 60s  - Vent wean per CCM -  Diurese as tolerated  5. AKI  - Due to ATN shock  - Resolved.  -Creatinine 0.8   6. COPD with chronic CO2 retention  - Stable. No wheeze.   7. ESBL E coli culture -04/15/17 ESBL from skin culture (penile meatus) - Off vanc. On meropenem.   -WBC 20. Febrile.   8. PAF - Maintaining NSR. Continue amio drip until amio drip.   9. Hypokalemia - K 3.8    Length of Stay: [place], NP  04/24/2017, 7:27 AM  Advanced Heart Failure Team Pager [phone] (M-F; [place])   Please contact [facility] for night-coverage after hours (4p -7a ) and weekends on [email or web address]  Agree  He remains intubated. Tolerated vent wean at 10/5 PS yesterday but did not do so well with 5/5. Volume status improved. Will repeat lasix 40 iv x1 today. Need to be careful not to overdiurese as he is having increased NE needs. Maintain NSR on amio. Renal function stable.   Exam Awake on vent Cor RRR Lungs coarse throughout Ab soft NT/ND Ext warm no edema Neuro: awake moves all 4s  Agitated on vent at times  He conitnues to struggle with vent wean in setting of severe underlying COPD. D/w Dr. Nelda Marseille in [place]. Will continue to try and wean. May need tach. Rhythm stable. Co-ox 73%. Volume status about as good as we can get it. Would note wean NE until we get him  extubated.    CRITICAL CARE Performed by: Glori Bickers  Total critical care time: 35 minutes  Critical care time was exclusive of separately billable procedures and treating other patients.  Critical care was necessary to treat or prevent imminent or life-threatening deterioration.  Critical care was time spent personally by me (independent of midlevel providers or residents) on the following activities: development of treatment plan with patient and/or surrogate as well as nursing, discussions with consultants, evaluation of patient's response to treatment, examination of patient, obtaining history from patient or surrogate, ordering and performing treatments and interventions, ordering and review of laboratory studies, ordering and review of radiographic studies, pulse oximetry and re-evaluation of patient's condition.  Glori Bickers, MD  9:03 AM

## 2017-04-25 ENCOUNTER — Inpatient Hospital Stay (HOSPITAL_COMMUNITY): Payer: Medicare Other

## 2017-04-25 LAB — GLUCOSE, CAPILLARY
GLUCOSE-CAPILLARY: 113 mg/dL — AB (ref 65–99)
GLUCOSE-CAPILLARY: 129 mg/dL — AB (ref 65–99)
Glucose-Capillary: 122 mg/dL — ABNORMAL HIGH (ref 65–99)
Glucose-Capillary: 105 mg/dL — ABNORMAL HIGH (ref 65–99)
Glucose-Capillary: 121 mg/dL — ABNORMAL HIGH (ref 65–99)
Glucose-Capillary: 113 mg/dL — ABNORMAL HIGH (ref 65–99)
Glucose-Capillary: 113 mg/dL — ABNORMAL HIGH (ref 65–99)

## 2017-04-25 LAB — CBC
HCT: 34 % — ABNORMAL LOW (ref 39.0–52.0)
Hemoglobin: 10.9 g/dL — ABNORMAL LOW (ref 13.0–17.0)
MCH: 27.7 pg (ref 26.0–34.0)
MCHC: 32.1 g/dL (ref 30.0–36.0)
MCV: 86.3 fL (ref 78.0–100.0)
Platelets: 706 10*3/uL — ABNORMAL HIGH (ref 150–400)
RBC: 3.94 MIL/uL — ABNORMAL LOW (ref 4.22–5.81)
RDW: 14.4 % (ref 11.5–15.5)
WBC: 20.7 10*3/uL — ABNORMAL HIGH (ref 4.0–10.5)

## 2017-04-25 LAB — COOXEMETRY PANEL
CARBOXYHEMOGLOBIN: 1.2 % (ref 0.5–1.5)
METHEMOGLOBIN: 1.1 % (ref 0.0–1.5)
O2 SAT: 63.7 %
Total hemoglobin: 11.1 g/dL — ABNORMAL LOW (ref 12.0–16.0)

## 2017-04-25 LAB — BLOOD GAS, ARTERIAL
Acid-Base Excess: 6.5 mmol/L — ABNORMAL HIGH (ref 0.0–2.0)
Bicarbonate: 29.5 mmol/L — ABNORMAL HIGH (ref 20.0–28.0)
DRAWN BY: 41977
O2 Content: 3 L/min
O2 SAT: 95.5 %
PATIENT TEMPERATURE: 98.6
pCO2 arterial: 35.5 mmHg (ref 32.0–48.0)
pH, Arterial: 7.531 — ABNORMAL HIGH (ref 7.350–7.450)
pO2, Arterial: 72.2 mmHg — ABNORMAL LOW (ref 83.0–108.0)

## 2017-04-25 LAB — RENAL FUNCTION PANEL
Albumin: 2.2 g/dL — ABNORMAL LOW (ref 3.5–5.0)
Anion gap: 8 (ref 5–15)
BUN: 26 mg/dL — AB (ref 6–20)
CHLORIDE: 97 mmol/L — AB (ref 101–111)
CO2: 27 mmol/L (ref 22–32)
CREATININE: 0.65 mg/dL (ref 0.61–1.24)
Calcium: 8.5 mg/dL — ABNORMAL LOW (ref 8.9–10.3)
GFR calc Af Amer: >60 mL/min (ref >60)
GLUCOSE: 119 mg/dL — AB (ref 65–99)
POTASSIUM: 3.5 mmol/L (ref 3.5–5.1)
Phosphorus: 1.3 mg/dL — ABNORMAL LOW (ref 2.5–4.6)
Sodium: 132 mmol/L — ABNORMAL LOW (ref 135–145)

## 2017-04-25 LAB — PROCALCITONIN

## 2017-04-25 MED ORDER — AMIODARONE HCL 200 MG PO TABS
400.0000 mg | ORAL_TABLET | Freq: Two times a day (BID) | ORAL | Status: DC
  Administered 2017-04-25 – 2017-04-26 (×3): 400 mg
  Filled 2017-04-25 (×3): qty 2

## 2017-04-25 MED ORDER — ACETAZOLAMIDE SODIUM 500 MG IJ SOLR
250.0000 mg | Freq: Four times a day (QID) | INTRAMUSCULAR | Status: AC
  Administered 2017-04-25 (×3): 250 mg via INTRAVENOUS
  Filled 2017-04-25 (×4): qty 250

## 2017-04-25 MED ORDER — GUAIFENESIN 100 MG/5ML PO SOLN: 5.0000 mL | Freq: Four times a day (QID) | ORAL | Status: DC | PRN

## 2017-04-25 MED ORDER — VANCOMYCIN HCL IN DEXTROSE 1-5 GM/200ML-% IV SOLN
1000.0000 mg | Freq: Three times a day (TID) | INTRAVENOUS | Status: DC
  Administered 2017-04-25 – 2017-04-27 (×5): 1000 mg via INTRAVENOUS
  Filled 2017-04-25 (×7): qty 200

## 2017-04-25 MED ORDER — VANCOMYCIN HCL 10 G IV SOLR
2000.0000 mg | Freq: Once | INTRAVENOUS | Status: AC
  Administered 2017-04-25: 2000 mg via INTRAVENOUS
  Filled 2017-04-25: qty 2000

## 2017-04-25 MED ORDER — POTASSIUM CHLORIDE 10 MEQ/50ML IV SOLN
10.0000 meq | INTRAVENOUS | Status: AC
  Administered 2017-04-25 (×2): 10 meq via INTRAVENOUS
  Filled 2017-04-25 (×2): qty 50

## 2017-04-25 MED ORDER — STERILE WATER FOR INJECTION IJ SOLN
INTRAMUSCULAR | Status: AC
  Administered 2017-04-25: 5 mL
  Filled 2017-04-25: qty 10

## 2017-04-25 MED ORDER — STERILE WATER FOR INJECTION IJ SOLN
INTRAMUSCULAR | Status: AC
  Administered 2017-04-25: 1 mL
  Filled 2017-04-25: qty 10

## 2017-04-25 NOTE — Progress Notes (Signed)
75cc IV fentanyl and 25cc IV versed wasted in sink with this RN and Kennith Centerachel Tarwater, RN.

## 2017-04-25 NOTE — Progress Notes (Addendum)
Patient ID: [name], male   DOB: [birth year], 66 y.o.   MRN: [id number] TCTS DAILY ICU PROGRESS NOTE                   [address]            [place] [id number]          [phone]   8 Days Post-Op Procedure(s) (LRB): REMOVAL OF IMPELLA LEFT VENTRICULAR ASSIST DEVICE (N/A)  Total Length of Stay:  LOS: 14 days   Subjective: Patient with difficulty expectorating sputum.   Objective: Vital signs in last 24 hours: Temp:  [98.9 F (37.2 C)-101.3 F (38.5 C)] 100.2 F (37.9 C) (11/27 0600) Pulse Rate:  [84-103] 94 (11/27 0600) Cardiac Rhythm: Normal sinus rhythm (11/27 0400) Resp:  [16-37] 27 (11/27 0600) BP: (96-174)/(45-116) 148/62 (11/27 0600) SpO2:  [95 %-100 %] 98 % (11/27 0600) Arterial Line BP: (78-119)/(41-86) 85/60 (11/27 0600) FiO2 (%):  [30 %] 30 % (11/26 0900) Weight:  [186 lb 11.7 oz (84.7 kg)] 186 lb 11.7 oz (84.7 kg) (11/27 0449)  Filed Weights   04/23/17 0500 04/24/17 0400 04/25/17 0449  Weight: 194 lb 10.7 oz (88.3 kg) 194 lb 10.7 oz (88.3 kg) 186 lb 11.7 oz (84.7 kg)    Weight change: -15 oz (-3.6 kg)   Hemodynamic parameters for last 24 hours: CVP:  [2 mmHg-8 mmHg] 2 mmHg  Intake/Output from previous day: 11/26 0701 - 11/27 0700 In: 2334.1 [I.V.:1133.3; NG/GT:1000.8; IV Piggyback:200] Out: 5025 [Urine:4175; Emesis/NG output:650; Stool:200]  Intake/Output this shift: No intake/output data recorded.  Current Meds: Scheduled Meds: . aspirin  81 mg Per Tube Daily  . atorvastatin  80 mg Per Tube q1800  . bisacodyl  10 mg Oral Daily   Or  . bisacodyl  10 mg Rectal Daily  . Chlorhexidine Gluconate Cloth  6 each Topical Daily  . enoxaparin (LOVENOX) injection  40 mg Subcutaneous Q24H  . feeding supplement (PRO-STAT SUGAR FREE 64)  30 mL Per Tube BID  . insulin aspart  0-24 Units Subcutaneous Q4H  . mouth rinse  15 mL Mouth Rinse BID  . pantoprazole sodium  40 mg Per Tube Daily  . potassium chloride  40 mEq Per Tube TID  .  QUEtiapine  50 mg Per Tube BID  . sodium chloride flush  10-40 mL Intracatheter Q12H  . sodium chloride flush  10-40 mL Intracatheter Q12H  . sodium chloride flush  3 mL Intravenous Q12H  . ticagrelor  90 mg Per Tube BID   Continuous Infusions: . sodium chloride Stopped (04/17/17 2130)  . sodium chloride    . sodium chloride 10 mL/hr at 04/24/17 1900  . sodium chloride    . amiodarone 30 mg/hr (04/25/17 0406)  . feeding supplement (VITAL HIGH PROTEIN) 1,000 mL (04/25/17 0406)  . lactated ringers    . lactated ringers Stopped (04/23/17 1900)  . lactated ringers Stopped (04/17/17 2133)  . meropenem (MERREM) IV Stopped (04/25/17 0505)  . norepinephrine (LEVOPHED) Adult infusion 6 mcg/min (04/25/17 0543)   PRN Meds:.sodium chloride, acetaminophen (TYLENOL) oral liquid 160 mg/5 mL, albuterol, fentaNYL (SUBLIMAZE) injection, [name]'s butt cream, lactated ringers, ondansetron (ZOFRAN) IV, sodium chloride flush, sodium chloride flush, sodium chloride flush  General appearance: alert, cooperative and no distress Heart: RRR Lungs: Diminshed at bases Abdomen: Soft, non tender, bowel sounds present Extremities: SCDs in place Wound: Sternal dressing taken down and wound is clean and dry  Lab Results: CBC: Recent Labs  04/24/17 0423 04/25/17 0337  WBC 20.0* 20.7*  HGB 10.8* 10.9*  HCT 34.3* 34.0*  PLT 642* 706*   BMET:  Recent Labs    04/24/17 0424 04/25/17 0337  NA 130* 132*  K 3.8 3.5  CL 93* 97*  CO2 29 27  GLUCOSE 139* 119*  BUN 34* 26*  CREATININE 0.80 0.65  CALCIUM 8.3* 8.5*    CMET: Lab Results  Component Value Date   WBC 20.7 (H) 04/25/2017   HGB 10.9 (L) 04/25/2017   HCT 34.0 (L) 04/25/2017   PLT 706 (H) 04/25/2017   GLUCOSE 119 (H) 04/25/2017   CHOL 167 04/11/2017   TRIG 109 04/11/2017   HDL 27 (L) 04/11/2017   LDLCALC 118 (H) 04/11/2017   ALT 22 04/23/2017   AST 19 04/23/2017   NA 132 (L) 04/25/2017   K 3.5 04/25/2017   CL 97 (L) 04/25/2017    CREATININE 0.65 04/25/2017   BUN 26 (H) 04/25/2017   CO2 27 04/25/2017   INR 1.27 04/18/2017      PT/INR: No results for input(s): LABPROT, INR in the last 72 hours. Radiology: No results found.   Assessment/Plan: S/P Procedure(s) (LRB): REMOVAL OF IMPELLA LEFT VENTRICULAR ASSIST DEVICE (N/A)  1. CV-S/p MI, LV pseudoaneurysm, and cardiogenic shock. Previous PAF. Maintaining SR. On Amiodarone and Norepinephrine drips. Co ox 63.7 this am. Also, on Brilinta. Will change Amiodarone to oral via tube. 2. Pulmonary-Extubated 11/26. Blood gas results noted. On 3 liters of oxygen via [place]. CXR this am appears to show small, stable right apical pneumothorax, cardiomegaly, and bibasilar atelectasis. Encourage incentive spirometer. Will give Guaifenesin to help expectorate sputum. 3. ID-ESBL, E.Coli. On Meropenem. Had low grade fever to 99.9. WBC remains 20,000. 4. Needs PT     Delight Ovensdward B Annasophia Crocker 04/25/2017 7:28 AM   sternum wound intact-  Remains on drips , levaphed  Tolerating extubation  Removed un needed central lines to cut down risk of infection  I have seen and examined [name] and agree with the above assessment  and plan.  Delight OvensEdward B Bryndan Bilyk MD Beeper [phone] Office [phone] 04/25/2017 11:09 AM

## 2017-04-25 NOTE — Evaluation (Signed)
Clinical/Bedside Swallow Evaluation Patient Details  Name: Arlana HoveGary Balliet MRN: [id number] Date of Birth: [birth year]  Today's Date: 04/25/2017 Time: SLP Start Time (ACUTE ONLY): 0908 SLP Stop Time (ACUTE ONLY): [id number] SLP Time Calculation (min) (ACUTE ONLY): 14 min  Past Medical History:  Past Medical History:  Diagnosis Date  . Coronary artery disease   . Hypertension    Past Surgical History:  Past Surgical History:  Procedure Laterality Date  . APPLICATION OF WOUND VAC N/A 04/14/2017   Procedure: APPLICATION OF WOUND VAC with reposition of Impella device;  Surgeon: Delight OvensGerhardt, Edward B, MD;  [facility];  Service: Thoracic;  Laterality: N/A;  . CORONARY STENT INTERVENTION N/A 04/11/2017   Procedure: CORONARY STENT INTERVENTION;  Surgeon: Corky CraftsVaranasi, Jayadeep S, MD;  Location: [facility] INVASIVE CV LAB;  Service: Cardiovascular;  Laterality: N/A;  . CORONARY THROMBECTOMY N/A 04/11/2017   Procedure: Coronary Thrombectomy;  Surgeon: Corky CraftsVaranasi, Jayadeep S, MD;  [facility];  Service: Cardiovascular;  Laterality: N/A;  . CORONARY/GRAFT ACUTE MI REVASCULARIZATION N/A 04/11/2017   Procedure: Coronary/Graft Acute MI Revascularization;  Surgeon: Corky CraftsVaranasi, Jayadeep S, MD;  [facility];  Service: Cardiovascular;  Laterality: N/A;  . IABP INSERTION N/A 04/11/2017   Procedure: IABP Insertion;  Surgeon: Corky CraftsVaranasi, Jayadeep S, MD;  [facility];  Service: Cardiovascular;  Laterality: N/A;  . LEFT HEART CATH AND CORONARY ANGIOGRAPHY N/A 04/11/2017   Procedure: LEFT HEART CATH AND CORONARY ANGIOGRAPHY;  Surgeon: Corky CraftsVaranasi, Jayadeep S, MD;  [facility];  Service: Cardiovascular;  Laterality: N/A;  . PLACEMENT OF IMPELLA LEFT VENTRICULAR ASSIST DEVICE  04/12/2017   Procedure: PLACEMENT OF IMPELLA  LD;  Surgeon: Delight OvensGerhardt, Edward B, MD;  [facility];  Service: Open Heart Surgery;;  . REMOVAL OF IMPELLA LEFT VENTRICULAR ASSIST DEVICE N/A 04/17/2017    Procedure: REMOVAL OF IMPELLA LEFT VENTRICULAR ASSIST DEVICE;  Surgeon: Delight OvensGerhardt, Edward B, MD;  [facility];  Service: Open Heart Surgery;  Laterality: N/A;  . REPAIR OF RIGHT VENTRICLE LACERATION Left 04/12/2017   Procedure: Resection of LV Aneurysm;  Surgeon: Delight OvensGerhardt, Edward B, MD;  [facility];  Service: Open Heart Surgery;  Laterality: Left;  . STERNAL CLOSURE N/A 04/14/2017   Procedure: STERNAL CLOSURE;  Surgeon: Delight OvensGerhardt, Edward B, MD;  [facility];  Service: Thoracic;  Laterality: N/A;  . TEE WITHOUT CARDIOVERSION N/A 04/14/2017   Procedure: TRANSESOPHAGEAL ECHOCARDIOGRAM (TEE);  Surgeon: Delight OvensGerhardt, Edward B, MD;  [facility];  Service: Thoracic;  Laterality: N/A;  . ULTRASOUND GUIDANCE FOR VASCULAR ACCESS  04/11/2017   Procedure: Ultrasound Guidance For Vascular Access;  Surgeon: Corky CraftsVaranasi, Jayadeep S, MD;  [facility];  Service: Cardiovascular;;   HPI:  [name] is 66 year old with h/o MI, HTN, COPD, CAD admitted with chest pian found to have inferior/lateral STEMI. Intubated 11/14-11/26. Underwent angioplasty to mid lad with 25% residual stenosis. CXR stable lung volumes. Left greater than right lower lobe collapse or consolidation suspected. Stable to mildly increased pulmonary vascularity without overt edema.   Assessment / Plan / Recommendation Clinical Impression  Pt s/p 13 day intubation with weak volitional cough, hoarse vocal quality; denies odonophagia. Mental status decreased however improving per nursing. Immediate and delayed throat clearing indicative of likely an acute reversible pharyngeal dysphagia. Continue oral care and NPO status (has NGT) and ST will follow and recommend objective evaluation when appropriate.    SLP Visit Diagnosis: Dysphagia, unspecified (R13.10)    Aspiration Risk  Moderate aspiration risk    Diet Recommendation NPO   Medication Administration: Via alternative means    Other  Recommendations Oral Care  Recommendations: Oral care QID   Follow up Recommendations (TBD)      Frequency and Duration min 2x/week  2 weeks       Prognosis Prognosis for Safe Diet Advancement: Good Barriers to Reach Goals: Cognitive deficits      Swallow Study   General HPI: [name] is 66 year old with h/o MI, HTN, COPD, CAD admitted with chest pian found to have inferior/lateral STEMI. Intubated 11/14-11/26. Underwent angioplasty to mid lad with 25% residual stenosis. CXR stable lung volumes. Left greater than right lower lobe collapse or consolidation suspected. Stable to mildly increased pulmonary vascularity without overt edema. Type of Study: Bedside Swallow Evaluation Previous Swallow Assessment: (none) Diet Prior to this Study: NPO;NG Tube Temperature Spikes Noted: Yes Respiratory Status: Nasal cannula History of Recent Intubation: Yes Length of Intubations (days): 13 days Date extubated: 04/24/17 Behavior/Cognition: Alert;Requires cueing;Cooperative;Confused Oral Cavity Assessment: Dry Oral Care Completed by SLP: Yes Oral Cavity - Dentition: Missing dentition;Poor condition Vision: Functional for self-feeding Self-Feeding Abilities: Needs set up;Needs assist Patient Positioning: Upright in bed Baseline Vocal Quality: Hoarse;Low vocal intensity Volitional Cough: Weak    Oral/Motor/Sensory Function Overall Oral Motor/Sensory Function: Within functional limits   Ice Chips Ice chips: Impaired Presentation: Spoon Oral Phase Impairments: Reduced lingual movement/coordination Pharyngeal Phase Impairments: Throat Clearing - Delayed   Thin Liquid Thin Liquid: Impaired Presentation: Cup Pharyngeal  Phase Impairments: Throat Clearing - Immediate;Throat Clearing - Delayed    Nectar Thick Nectar Thick Liquid: Not tested   Honey Thick Honey Thick Liquid: Not tested   Puree Puree: Impaired Pharyngeal Phase Impairments: Throat Clearing - Delayed   Solid   GO   Solid: Not tested         [name], Hakim Minniefield [name] 04/25/2017,9:34 AM   Breck CoonsLisa [name] Lonell FaceLitaker M.Ed [employer]-SLP Pager [phone]

## 2017-04-25 NOTE — Progress Notes (Signed)
EKG CRITICAL VALUE     12 lead EKG performed.  Critical value noted.  Michail JewelsJanice Coble, RN notified.   Lanae BoastBREWER, Judaea Burgoon, CCT 04/25/2017 8:38 AM

## 2017-04-25 NOTE — Evaluation (Signed)
Physical Therapy Evaluation Patient Details Name: Ronald HoveGary [name] MRN: [id number] DOB: [birth year] Today's Date: 04/25/2017   History of Present Illness  Pt is a 66 y.o. male with PMH of remote MI, HTN, COPD called EMS on 04/11/17 with c/o chest pain and found to have STEMI. Intubated 11/14-11/26. Underwent thrombectomy and angioplasty with no reflow; intra-aortic balloon pump placed. Had LV pseudoaneurysm with placement of 5.0 Impella; removal of Impella on 11/19 secondary to clotted pump.    Clinical Impression  Pt presents with decreased activity tolerance, decreased awareness, and an overall decrease in functional mobility secondary to above. Pt with increased distraction throughout session, requiring repeated redirection to task, therefore did not pursue many details regarding PLOF and support at home (will plan to get this info next session). Educ on precautions and importance of mobility. Pt with increased anxiety regarding BLE weakness and O2 needs, despite SpO2 remained >95% throughout session. Required min-modA (+2 safety/lines) for taking steps with Carley HammedEva walker from chair to bed. Feel pt would benefit from CIR-level therapies at d/c to maximize functional mobility and independence. Will continue to follow acutely to address established goals.    Follow Up Recommendations CIR;Supervision/Assistance - 24 hour    Equipment Recommendations  Other (comment)(TBD)    Recommendations for Other Services Rehab consult;OT consult     Precautions / Restrictions Precautions Precautions: Sternal;Fall Restrictions Other Position/Activity Restrictions: Sternal precautions      Mobility  Bed Mobility Overal bed mobility: Needs Assistance Bed Mobility: Sit to Supine       Sit to supine: Min assist   General bed mobility comments: Cues for log roll technique to maintain sternal precautions, minA to assist BLEs into bed. Cues for sequencing with scooting up in bed  Transfers Overall  transfer level: Needs assistance Equipment used: 4-wheeled walker(Eva walker) Transfers: Sit to/from Stand Sit to Stand: Min assist;+2 safety/equipment         General transfer comment: Educ on technique for standing with hands on knees to maintain sternal precautions, but pt still reaching out for walker upon standing. MinA to assist trunk elevation and maintain balance  Ambulation/Gait Ambulation/Gait assistance: Mod assist;+2 safety/equipment Ambulation Distance (Feet): 5 Feet Assistive device: 4-wheeled walker(Eva walker) Gait Pattern/deviations: Step-to pattern Gait velocity: Decreased Gait velocity interpretation: <1.8 ft/sec, indicative of risk for recurrent falls General Gait Details: Pt with increased anxiety about O2 needs (SpO2 >95% throughout treatment) and lines/leads despite multiple attempts to redirect; pt also worried about BLE weakness with increased pushing through BUEs on RW despite multiple cues to correct. Able to amb from chair to bed with modA to steady walker, but pt unable to keep from pushing through HoneywellBUEs  Stairs            Wheelchair Mobility    Modified Rankin (Stroke Patients Only)       Balance Overall balance assessment: Needs assistance   Sitting balance-Leahy Scale: Fair       Standing balance-Leahy Scale: Poor                               Pertinent Vitals/Pain Pain Assessment: Faces Faces Pain Scale: Hurts little more Pain Location: Sternal incision Pain Descriptors / Indicators: Grimacing;Guarding Pain Intervention(s): Monitored during session;Limited activity within patient's tolerance    Home Living Family/patient expects to be discharged to:: Private residence Living Arrangements: Alone               Additional Comments: Pt with  increased distraction throughout session; will need to get further home set-up, support, and PLOF details during next treatment session    Prior Function            Comments: Pt with increased distraction throughout session; will need to get further home set-up, support, and PLOF details during next treatment session     Hand Dominance        Extremity/Trunk Assessment   Upper Extremity Assessment Upper Extremity Assessment: Generalized weakness    Lower Extremity Assessment Lower Extremity Assessment: Generalized weakness    Cervical / Trunk Assessment Cervical / Trunk Assessment: Kyphotic  Communication   Communication: Expressive difficulties(soft voice quality)  Cognition Arousal/Alertness: Awake/alert Behavior During Therapy: Anxious Overall Cognitive Status: No family/caregiver present to determine baseline cognitive functioning Area of Impairment: Attention;Following commands;Safety/judgement;Awareness;Problem solving                   Current Attention Level: Sustained   Following Commands: Follows one step commands inconsistently Safety/Judgement: Decreased awareness of safety;Decreased awareness of deficits Awareness: Emergent Problem Solving: Requires verbal cues General Comments: Pt with increased distraction throughout session, very concerned/anxious about oxygen while fixating on the wall clock and apparently thinking it reflected his O2 needs (despite education regarding this). Also with increased distraction about his lines/leads throughout. Decreased ability to follow sternal precautions (when discussing importance of this, pt began talking about his mom's heart sx)      General Comments      Exercises     Assessment/Plan    PT Assessment Patient needs continued PT services  PT Problem List Decreased strength;Decreased activity tolerance;Decreased balance;Decreased mobility;Decreased cognition;Decreased knowledge of use of DME;Decreased safety awareness;Decreased knowledge of precautions;Cardiopulmonary status limiting activity       PT Treatment Interventions DME instruction;Gait training;Stair  training;Functional mobility training;Therapeutic activities;Therapeutic exercise;Balance training;Patient/family education    PT Goals (Current goals can be found in the Care Plan section)  Acute Rehab PT Goals Patient Stated Goal: None stated Time For Goal Achievement: 05/09/17    Frequency Min 3X/week   Barriers to discharge Decreased caregiver support      Co-evaluation               AM-PAC PT "6 Clicks" Daily Activity  Outcome Measure Difficulty turning over in bed (including adjusting bedclothes, sheets and blankets)?: None Difficulty moving from lying on back to sitting on the side of the bed? : Unable Difficulty sitting down on and standing up from a chair with arms (e.g., wheelchair, bedside commode, etc,.)?: Unable Help needed moving to and from a bed to chair (including a wheelchair)?: A Little Help needed walking in hospital room?: A Lot Help needed climbing 3-5 steps with a railing? : A Lot 6 Click Score: 13    End of Session Equipment Utilized During Treatment: Gait belt;Oxygen Activity Tolerance: Other (comment)(Anxiety, distraction) Patient left: in bed;with call bell/phone within reach;with nursing/sitter in room Nurse Communication: Mobility status PT Visit Diagnosis: Other abnormalities of gait and mobility (R26.89);Muscle weakness (generalized) (M62.81)    Time: 1430-1505 PT Time Calculation (min) (ACUTE ONLY): 35 min   Charges:   PT Evaluation $PT Eval Moderate Complexity: 1 Mod PT Treatments $Therapeutic Activity: 8-22 mins   PT G Codes:       Ina HomesJaclyn Brisia Schuermann, PT, DPT Acute Rehab Services  Pager: [phone]  Malachy ChamberJaclyn L Athan Casalino 04/25/2017, 4:13 PM

## 2017-04-25 NOTE — Progress Notes (Signed)
Advanced Heart Failure Rounding Note   Subjective:    - S/P LV pseudoaneurysm repeat 11/14 Impella 5.0 Placed.  - Chest closed 11/16.  - Started on broad spectrum abx on 11/17 for fever and leukocytosis.  - Impella noted to have poor waveform am of 11/19. Thought to have possible clot.  Heparin restarted and pump pulled afternoon of 04/17/17. - Extubated 11/26.  Currently on NE 6 mcg. Weak but says he feels ok. Mildly confused at times.   Denies SOB. Having a hard time sleeping.   Objective:   Weight Range: 186 lb 11.7 oz (84.7 kg) Body mass index is 29.25 kg/m.   Vital Signs:   Temp:  [98.9 F (37.2 C)-101.3 F (38.5 C)] 99.9 F (37.7 C) (11/27 0700) Pulse Rate:  [84-103] 93 (11/27 0700) Resp:  [16-37] 21 (11/27 0700) BP: (96-174)/(45-101) 143/67 (11/27 0700) SpO2:  [95 %-100 %] 98 % (11/27 0700) Arterial Line BP: (78-118)/(41-86) 93/56 (11/27 0700) FiO2 (%):  [30 %] 30 % (11/26 0900) Weight:  [186 lb 11.7 oz (84.7 kg)] 186 lb 11.7 oz (84.7 kg) (11/27 0449) Last BM Date: 04/24/17(flexiseal)  Weight change: Filed Weights   04/23/17 0500 04/24/17 0400 04/25/17 0449  Weight: 194 lb 10.7 oz (88.3 kg) 194 lb 10.7 oz (88.3 kg) 186 lb 11.7 oz (84.7 kg)   Intake/Output:   Intake/Output Summary (Last 24 hours) at 04/25/2017 0816 Last data filed at 04/25/2017 0700 Gross per 24 hour  Intake 2416.4 ml  Output 4825 ml  Net -2408.6 ml    Physical Exam   CVP 2.  General:  No resp difficulty. In bed.  HEENT: normal x for NGT Neck: supple. no JVD. Carotids 2+ bilat; no bruits. No lymphadenopathy or thryomegaly appreciated. Cor: PMI nondisplaced. Regular rate & rhythm. No rubs, gallops or murmurs. Lungs: course Abdomen: soft, nontender, nondistended. No hepatosplenomegaly. No bruits or masses. Good bowel sounds. Extremities: no cyanosis, clubbing, rash, RLE and LLE SCDs. LUE A line. RUE PICC. RIJ Neuro: alert and conversant, cranial nerves grossly intact. moves all 4  extremities w/o difficulty. Affect pleasant   Telemetry   NSR 90s personally reviewed.  EKG   N/A  Labs    CBC Recent Labs    04/23/17 0416 04/24/17 0423 04/25/17 0337  WBC 17.7* 20.0* 20.7*  NEUTROABS 14.6* 16.4*  --   HGB 11.0* 10.8* 10.9*  HCT 34.3* 34.3* 34.0*  MCV 88.4 89.1 86.3  PLT 567* 642* 706*   Basic Metabolic Panel Recent Labs    16/02/9610/25/18 0416 04/24/17 0424 04/25/17 0337  NA 129* 130* 132*  K 3.3* 3.8 3.5  CL 88* 93* 97*  CO2 33* 29 27  GLUCOSE 140* 139* 119*  BUN 31* 34* 26*  CREATININE 0.84 0.80 0.65  CALCIUM 8.5* 8.3* 8.5*  MG 2.2 2.3  --   PHOS  --  2.7 1.3*   Liver Function Tests Recent Labs    04/23/17 0416 04/24/17 0424 04/25/17 0337  AST 19  --   --   ALT 22  --   --   ALKPHOS 62  --   --   BILITOT 0.7  --   --   PROT 6.4*  --   --   ALBUMIN 2.3* 2.3* 2.2*   No results for input(s): LIPASE, AMYLASE in the last 72 hours. Cardiac Enzymes No results for input(s): CKTOTAL, CKMB, CKMBINDEX, TROPONINI in the last 72 hours.  BNP: BNP (last 3 results) No results for input(s): BNP in the  last 8760 hours.  ProBNP (last 3 results) No results for input(s): PROBNP in the last 8760 hours.   D-Dimer No results for input(s): DDIMER in the last 72 hours. Hemoglobin A1C No results for input(s): HGBA1C in the last 72 hours. Fasting Lipid Panel No results for input(s): CHOL, HDL, LDLCALC, TRIG, CHOLHDL, LDLDIRECT in the last 72 hours. Thyroid Function Tests No results for input(s): TSH, T4TOTAL, T3FREE, THYROIDAB in the last 72 hours.  Invalid input(s): FREET3  Other results:   Imaging    No results found.   Medications:     Scheduled Medications: . aspirin  81 mg Per Tube Daily  . atorvastatin  80 mg Per Tube q1800  . bisacodyl  10 mg Oral Daily   Or  . bisacodyl  10 mg Rectal Daily  . Chlorhexidine Gluconate Cloth  6 each Topical Daily  . enoxaparin (LOVENOX) injection  40 mg Subcutaneous Q24H  . feeding supplement  (PRO-STAT SUGAR FREE 64)  30 mL Per Tube BID  . insulin aspart  0-24 Units Subcutaneous Q4H  . mouth rinse  15 mL Mouth Rinse BID  . pantoprazole sodium  40 mg Per Tube Daily  . potassium chloride  40 mEq Per Tube TID  . QUEtiapine  50 mg Per Tube BID  . sodium chloride flush  10-40 mL Intracatheter Q12H  . sodium chloride flush  10-40 mL Intracatheter Q12H  . sodium chloride flush  3 mL Intravenous Q12H  . ticagrelor  90 mg Per Tube BID    Infusions: . sodium chloride Stopped (04/17/17 2130)  . sodium chloride    . sodium chloride 10 mL/hr at 04/24/17 1900  . sodium chloride    . amiodarone 30 mg/hr (04/25/17 0406)  . feeding supplement (VITAL HIGH PROTEIN) 1,000 mL (04/25/17 0406)  . lactated ringers    . lactated ringers Stopped (04/23/17 1900)  . lactated ringers Stopped (04/17/17 2133)  . meropenem (MERREM) IV Stopped (04/25/17 0505)  . norepinephrine (LEVOPHED) Adult infusion 6 mcg/min (04/25/17 0543)    PRN Medications: sodium chloride, acetaminophen (TYLENOL) oral liquid 160 mg/5 mL, albuterol, fentaNYL (SUBLIMAZE) injection, Gerhardt's butt cream, lactated ringers, ondansetron (ZOFRAN) IV, sodium chloride flush, sodium chloride flush, sodium chloride flush    Patient Profile   [name] is 66 year old with h/o MI, HTN, COPD admitted with chest pain. EKG with evidence of inferior/lateral STEMI. Due to lethargy and hypoxemia he was intubated in the ED.  Taken to cath lab with RCA DES and POBA to chronically occluded LAD with IABP placed.   Had LV pseudo aneurysm repair 11/14 .   Assessment/Plan   1. CAD with OOH anterior MI c/b LV pseudoaneurysm and cardiogenic shock - attempted PCI of LAD failed due to chronic occlusion and extensive infarct - s/p PCI/DES to distal RCA Remains on ASA, brilinta, atorvastatin - stable no s/s ischemia  2.  S/P LV pseudoaneurysm repair 11/14 - Chest closed 04/13/17. Stable.   3. Acute systolic HF-> cardiogenic shock - Impella  removed 04/17/17 - EF improved 25%->40% on recent intra-op TEE - NE 6 mcg.  CO-OX 64%. Continue to wean NE.  -Volume status stable. No lasix for now. .   4. Acute Hypoxic Respiratory Failure  - Intubated 11/13.  - Has severe COPD. Baseline PCO2 60s  - Extubated 11/26  5. AKI  - Due to ATN shock  - Resolved.  -Creatinine 0.65   6. COPD with chronic CO2 retention  - Stable. No wheeze.  7. ESBL E coli culture -04/15/17 ESBL from skin culture (penile meatus) - Off vanc. On meropenem.    WBC 20.7.  Remains febrile.   8. PAF - Maintaining NSR. Switch amio to po  9. Hypokalemia - K 3.5   10. Severe Deconditioning Consult PT/OT   Length of Stay: 14  Amy Clegg, NP  04/25/2017, 8:16 AM  Advanced Heart Failure Team Pager [phone] (M-F; 7a - 4p)   Please contact [facility] for night-coverage after hours (4p -7a ) and weekends on [email or web address]    Patient seen and examined with Tonye Becket, NP. We discussed all aspects of the encounter. I agree with the assessment and plan as stated above.   He has been extubated. Volume status looks good. Hold lasix today but restart soon. He is at high risk for reintubation with severe COPD and weakness. Will try to mobilize today. PT to see. Can switch amio to po.   Still with low grade fevers and leukocytosis. Continue meropenem. Encourage IS. Watch closely for worsening s/s of infection. D/w CCM   Arvilla Meres, MD  10:29 AM

## 2017-04-25 NOTE — Progress Notes (Signed)
PULMONARY / CRITICAL CARE MEDICINE   Name: [name] MRN: [id number] DOB: 1950-09-29    ADMISSION DATE:  04/11/2017 CONSULTATION DATE:  04/11/17  REFERRING MD:  Irish Lack  CHIEF COMPLAINT:  STEMI  HISTORY OF PRESENT ILLNESS:  [name] is a 66 y.o. male with PMH of CAD and HTN.  He presented to [facility] ED 11/13 with sudden onset of SOB and chest pain.  He was found to have inferolateral STEMI and was taken urgently to cath lab.  Prior to cath lab, he was intubated; therefore, PCCM asked to see in consultation to assist with vent management.  In cath lab, he was found to have 100% stenosed mid LAD lesion that was balloon angioplastied with 25% residual stenosis, distal RCA lesion which had DES placed, Ost 1st diag lesion 50% stenosed, proximal ramus 70% stenosed.  IABP was also placed. Has had ongoing shock, pressor dependent.   SUBJECTIVE:  Extubated yesterday, tolerating well.   VITAL SIGNS: BP (!) 143/67   Pulse 93   Temp 99.9 F (37.7 C)   Resp (!) 21   Ht _0  (1.702 m)   Wt 84.7 kg (186 lb 11.7 oz)   SpO2 98%   BMI 29.25 kg/m   HEMODYNAMICS: CVP:  [2 mmHg-5 mmHg] 2 mmHg  VENTILATOR SETTINGS: Vent Mode: CPAP;PSV FiO2 (%):  [30 %] 30 % PEEP:  [5 cmH20] 5 cmH20 Pressure Support:  [10 cmH20] 10 cmH20  INTAKE / OUTPUT:  Intake/Output Summary (Last 24 hours) at 04/25/2017 0841 Last data filed at 04/25/2017 0700 Gross per 24 hour  Intake 2416.4 ml  Output 4825 ml  Net -2408.6 ml   Filed Weights   04/23/17 0500 04/24/17 0400 04/25/17 0449  Weight: 88.3 kg (194 lb 10.7 oz) 88.3 kg (194 lb 10.7 oz) 84.7 kg (186 lb 11.7 oz)    PHYSICAL EXAMINATION:  General: Elderly male of normal body habitus in NAD HEENT: [place]/AT, PERRL, no appreciable JVD PSY: awake, alert, follows commands. Some confusion Neuro: Moves all extremities x4 CV: RRR, no MRG PULM: coarse bibasilar JM:EQAS, diffuse tenderness, bsx4 active  Extremities: warm/dry, bilateral upper extremity edema.   Skin: no rashes or lesions   LABS:  BMET Recent Labs  Lab 04/23/17 0416 04/24/17 0424 04/25/17 0337  NA 129* 130* 132*  K 3.3* 3.8 3.5  CL 88* 93* 97*  CO2 33* 29 27  BUN 31* 34* 26*  CREATININE 0.84 0.80 0.65  GLUCOSE 140* 139* 119*   Electrolytes Recent Labs  Lab 04/21/17 0333  04/23/17 0416 04/24/17 0424 04/25/17 0337  CALCIUM 8.6*   < > 8.5* 8.3* 8.5*  MG 2.1  --  2.2 2.3  --   PHOS 3.1  --   --  2.7 1.3*   < > = values in this interval not displayed.   CBC Recent Labs  Lab 04/23/17 0416 04/24/17 0423 04/25/17 0337  WBC 17.7* 20.0* 20.7*  HGB 11.0* 10.8* 10.9*  HCT 34.3* 34.3* 34.0*  PLT 567* 642* 706*   Coag's No results for input(s): APTT, INR in the last 168 hours. Sepsis Markers No results for input(s): LATICACIDVEN, PROCALCITON, O2SATVEN in the last 168 hours. ABG Recent Labs  Lab 04/23/17 0450 04/24/17 0620 04/25/17 0345  PHART 7.387 7.380 7.531*  PCO2ART 58.2* 52.5* 35.5  PO2ART 95.4 111* 72.2*   Liver Enzymes Recent Labs  Lab 04/19/17 0344 04/23/17 0416 04/24/17 0424 04/25/17 0337  AST 40 19  --   --   ALT 51 22  --   --  ALKPHOS 59 62  --   --   BILITOT 0.9 0.7  --   --   ALBUMIN 2.4* 2.3* 2.3* 2.2*   Cardiac Enzymes No results for input(s): TROPONINI, PROBNP in the last 168 hours. Glucose Recent Labs  Lab 04/24/17 0806 04/24/17 1142 04/24/17 1702 04/24/17 1926 04/24/17 2327 04/25/17 0347  GLUCAP 144* 108* 109* 130* 122* 105*    Imaging No results found. STUDIES:  CXR 11/13 > possible PNA. Echo TEE 11/16 >     Right ventricle: Prior to chest closure, the RV size and systolic function appeared adequate. Following chest closure there was some reduction RV systolic function, but there was no RV enlargement or septal shift.  Mitral valve: The mitral valve leaflets were of normal thickness. There was good leaflet separation. There was normal leaflet coptation without prolapsing or flail leaflet segments. There was  trace mitral insufficiency. The impella cannula did not interfere with the mitral valve apparatus.  Tricuspid valve: Trace regurgitation. The tricuspid valve regurgitation jet is central.   CULTURES: Blood 11/13 > 102 with E. coli Sputum 11/13 > negative Wound 11/17>> ecoli  ANTIBIOTICS: Ceftriaxone 11/13 > 11/17 Azithromycin 11/13 > 11/17 vanc 11/17>>>11/20 Zosyn 11/17>>>11/20 Meropenem 04/18/2017>>  SIGNIFICANT EVENTS: 11/13 > admit.  With resection of left ventricular aneurysm and placement of 5.0 Impella 04/17/2017 removal of clotted impella  pump   LINES/TUBES: ETT 11/13 >  R femoral sheath 11/13 >  R IJ CVL (bensimohn) 11/14>>>  DISCUSSION: 66 y.o. male admitted 11/13 with inferolateral STEMI.  He was intubated and was then taken to cath lab where he was found to have 100% stenosed mid LAD lesion that was balloon angioplastied with 25% residual stenosis, distal RCA lesion which had DES placed, Ost 1st diag lesion 50% stenosed, proximal ramus 70% stenosed.  IABP was also placed. On f/u echo had ?MV rupture - taken urgently to OR.  Had LV pseudoaneurysm rupture, repaired, impala placed.  Prolonged ICU stay followed. He remained on vent until 11/26 and continues on pressors.   ASSESSMENT / PLAN:  PULMONARY A: Acute respiratory failure - r/t STEMI ? PNA - bilateral patchiness - ?aspiration v edema  P:   Supplemental O2 to keep sats > 90% Diuresis per advanced CHF team Abx as below Incentive spirometry and flutter valve Intermittent CXR  CARDIOVASCULAR A:  Inferolateral STEMI - s/p cath lab where he was found to have 100% stenosed mid LAD lesion that was balloon angioplastied with 25% residual stenosis, distal RCA lesion which had DES placed, Ost 1st diag lesion 50% stenosed, proximal ramus 70% stenosed.  IABP was also placed. LV pseudoaneurysm rupture - to OR emergently 11/14, chest closure 11/16 Hypotension - suspect sedation related (was normotensive on arrival to  ICU but was somewhat agitated so got additional sedation and later became hypotensive). Hx HTN, CAD. P:  Cardiology/CVTS managing Titrate levophed for MAP > 65 mmHg Diuresis per CHF team Continue amiodarone  Anticoagulation per cards - bivalirudin, cangrelor   RENAL A:   Hyponatremia > improving AKI - resolved Met alkalosis  P:   Follow BMP Volume management per heart failure team.   GASTROINTESTINAL A:   GI prophylaxis Nutrition  P:   SUP: Pantoprazole. Continue TF  HEMATOLOGIC A:   VTE Prophylaxis P:  Anticoagulation as above  CBC daily  INFECTIOUS A:   Possible PNA. Perineal wound infection E.coli > sens to imipenem.  P:   Broad spectrum abx as above  Follow cultures - neg to date  Trend wbc, fever curve  At day 7 of meropenem still with fevers and WBC 20 so may need to keep.  Will check PCT  ENDOCRINE A:   No acute issues P:   Monitor glucose on chem   NEUROLOGIC A:   Sedation due to mechanical ventilation. Agitation/anxiety > improving P:   Sedation:  Off PRN fentanyl for pain managemnent OT/PT/SLP  Georgann Housekeeper, AGACNP-BC [place] [facility]/Critical Care Pager [phone] or [phone]  04/25/2017 8:50 AM  Attending Note:  66 year old male s/p emergent cardiac surgery who was intubated and on pressor for cardiogenic shock that is now extubated as off 11/26 but remains on pressors.  On exam, lungs with coarse BS diffusely.  I reviewed CXR myself, pulmonary edema noted.  Mixed acidosis on ABG.  Will give acetazolamide for diureses and metabolic acidosis.  Replace electrolytes.  Continue pressors for BP support.  Diureses per CHF team.  Will hold in the ICU for pressor use.  The patient is critically ill with multiple organ systems failure and requires high complexity decision making for assessment and support, frequent evaluation and titration of therapies, application of advanced monitoring technologies and extensive interpretation of  multiple databases.   Critical Care Time devoted to patient care services described in this note is  35  Minutes. This time reflects time of care of this signee Dr Jennet Maduro. This critical care time does not reflect procedure time, or teaching time or supervisory time of PA/NP/Med student/Med Resident etc but could involve care discussion time.  Rush Farmer, M.D. [facility] Pulmonary/Critical Care Medicine. Pager: [phone]. After hours pager: [phone].

## 2017-04-25 NOTE — Progress Notes (Addendum)
PT Cancellation Note  Patient Details Name: [name] MRN: [id number] DOB: 04/25/1951   Cancelled Treatment:    Reason Eval/Treat Not Completed: Patient not medically ready. Per RN, just discharged pt's pacing wires and PT will need to hold off for evaluation ~1 hour. Will follow-up as time allows.  Ina HomesJaclyn Karrington Studnicka, PT, DPT Acute Rehab Services  Pager: [phone]  Malachy ChamberJaclyn L Aamina Skiff 04/25/2017, 11:34 AM

## 2017-04-25 NOTE — Progress Notes (Signed)
[facility] BRIEF SICU PROGRESS NOTE  8 Days Post-Op  S/P Procedure(s) (LRB): REMOVAL OF IMPELLA LEFT VENTRICULAR ASSIST DEVICE (N/A)   Stable day NSR w/ stable BP off levophed Breathing comfortably w/ O2 sats 99% on 4 L/min Diuresing well   Plan: Continue current plan  Ronald Nailslarence H Taitum Menton, MD 04/25/2017 5:19 PM

## 2017-04-25 NOTE — Progress Notes (Addendum)
Pharmacy Antibiotic Note  Arlana HoveGary Schmale is a 66 y.o. male admitted on 04/11/2017 with LV rupture, pharmacy is being consulted for antibiotic treatment for ESBL E.Coli perineal infection/possible PNA. Pt is on day 7 of meropenem, WBC continues to increase now at 20.7. Patient has intermittent bouts of fever. SCr stable. PCT <0.10. Pharmacy consulted to further broaden with vancomycin.  Plan: -Continue Meropenem 1g IV q8h -Vancomycin 2,000 mg IV bolus x 1 -Vancomycin 1,000 mg IV Q8hr -Monitor renal funx, cultures, LOT  Height: 5\' 7"  (170.2 cm) Weight: 186 lb 11.7 oz (84.7 kg) IBW/kg (Calculated) : 66.1  Temp (24hrs), Avg:100.4 F (38 C), Min:98.9 F (37.2 C), Max:101.3 F (38.5 C)  Recent Labs  Lab 04/20/17 0420 04/21/17 0333 04/21/17 1810 04/22/17 0355 04/23/17 0416 04/24/17 0423 04/24/17 0424 04/25/17 0337  WBC 13.1* 13.5*  --   --  17.7* 20.0*  --  20.7*  CREATININE 1.05 1.00 0.90 0.99 0.84  --  0.80 0.65    Estimated Creatinine Clearance: 91.9 mL/min (by C-G formula based on SCr of 0.65 mg/dL).    No Known Allergies  Adline PotterSabrina Shiesha Jahn, PharmD Pharmacy Resident Pager: [phone]

## 2017-04-26 ENCOUNTER — Inpatient Hospital Stay (HOSPITAL_COMMUNITY): Payer: Medicare Other

## 2017-04-26 DIAGNOSIS — M542 Cervicalgia: Secondary | ICD-10-CM

## 2017-04-26 DIAGNOSIS — R0682 Tachypnea, not elsewhere classified: Secondary | ICD-10-CM

## 2017-04-26 DIAGNOSIS — R5081 Fever presenting with conditions classified elsewhere: Secondary | ICD-10-CM

## 2017-04-26 DIAGNOSIS — R0902 Hypoxemia: Secondary | ICD-10-CM

## 2017-04-26 DIAGNOSIS — R509 Fever, unspecified: Secondary | ICD-10-CM

## 2017-04-26 DIAGNOSIS — D72829 Elevated white blood cell count, unspecified: Secondary | ICD-10-CM

## 2017-04-26 DIAGNOSIS — J9601 Acute respiratory failure with hypoxia: Secondary | ICD-10-CM

## 2017-04-26 DIAGNOSIS — A419 Sepsis, unspecified organism: Secondary | ICD-10-CM

## 2017-04-26 DIAGNOSIS — I5043 Acute on chronic combined systolic (congestive) and diastolic (congestive) heart failure: Secondary | ICD-10-CM

## 2017-04-26 DIAGNOSIS — R7881 Bacteremia: Secondary | ICD-10-CM

## 2017-04-26 DIAGNOSIS — I10 Essential (primary) hypertension: Secondary | ICD-10-CM

## 2017-04-26 DIAGNOSIS — G8929 Other chronic pain: Secondary | ICD-10-CM

## 2017-04-26 DIAGNOSIS — D62 Acute posthemorrhagic anemia: Secondary | ICD-10-CM

## 2017-04-26 DIAGNOSIS — R1310 Dysphagia, unspecified: Secondary | ICD-10-CM

## 2017-04-26 DIAGNOSIS — I253 Aneurysm of heart: Secondary | ICD-10-CM

## 2017-04-26 DIAGNOSIS — I509 Heart failure, unspecified: Secondary | ICD-10-CM

## 2017-04-26 DIAGNOSIS — R29898 Other symptoms and signs involving the musculoskeletal system: Secondary | ICD-10-CM

## 2017-04-26 LAB — URINALYSIS, ROUTINE W REFLEX MICROSCOPIC
BILIRUBIN URINE: NEGATIVE
Glucose, UA: NEGATIVE mg/dL
KETONES UR: NEGATIVE mg/dL
LEUKOCYTES UA: NEGATIVE
NITRITE: NEGATIVE
PROTEIN: 30 mg/dL — AB
SPECIFIC GRAVITY, URINE: 1.024 (ref 1.005–1.030)
Squamous Epithelial / LPF: NONE SEEN
pH: 6 (ref 5.0–8.0)

## 2017-04-26 LAB — RENAL FUNCTION PANEL
Albumin: 2.2 g/dL — ABNORMAL LOW (ref 3.5–5.0)
Albumin: 2.3 g/dL — ABNORMAL LOW (ref 3.5–5.0)
Anion gap: 7 (ref 5–15)
Anion gap: 5 (ref 5–15)
BUN: 27 mg/dL — ABNORMAL HIGH (ref 6–20)
BUN: 29 mg/dL — ABNORMAL HIGH (ref 6–20)
CALCIUM: 8.5 mg/dL — AB (ref 8.9–10.3)
CO2: 25 mmol/L (ref 22–32)
CO2: 26 mmol/L (ref 22–32)
CREATININE: 0.66 mg/dL (ref 0.61–1.24)
Calcium: 8.7 mg/dL — ABNORMAL LOW (ref 8.9–10.3)
Chloride: 104 mmol/L (ref 101–111)
Chloride: 104 mmol/L (ref 101–111)
Creatinine, Ser: 0.74 mg/dL (ref 0.61–1.24)
GFR calc Af Amer: >60 mL/min (ref >60)
GFR calc non Af Amer: >60 mL/min (ref >60)
Glucose, Bld: 96 mg/dL (ref 65–99)
Glucose, Bld: 131 mg/dL — ABNORMAL HIGH (ref 65–99)
Phosphorus: 2.2 mg/dL — ABNORMAL LOW (ref 2.5–4.6)
Phosphorus: 2.5 mg/dL (ref 2.5–4.6)
Potassium: 3.2 mmol/L — ABNORMAL LOW (ref 3.5–5.1)
Potassium: 4 mmol/L (ref 3.5–5.1)
SODIUM: 136 mmol/L (ref 135–145)
Sodium: 135 mmol/L (ref 135–145)

## 2017-04-26 LAB — COOXEMETRY PANEL
Carboxyhemoglobin: 1.2 % (ref 0.5–1.5)
Methemoglobin: 1.1 % (ref 0.0–1.5)
O2 Saturation: 65.7 %
Total hemoglobin: 11.2 g/dL — ABNORMAL LOW (ref 12.0–16.0)

## 2017-04-26 LAB — GLUCOSE, CAPILLARY
GLUCOSE-CAPILLARY: 88 mg/dL (ref 65–99)
GLUCOSE-CAPILLARY: 124 mg/dL — AB (ref 65–99)
Glucose-Capillary: 104 mg/dL — ABNORMAL HIGH (ref 65–99)
Glucose-Capillary: 116 mg/dL — ABNORMAL HIGH (ref 65–99)
Glucose-Capillary: 120 mg/dL — ABNORMAL HIGH (ref 65–99)
Glucose-Capillary: 98 mg/dL (ref 65–99)

## 2017-04-26 LAB — CBC
HCT: 34.6 % — ABNORMAL LOW (ref 39.0–52.0)
Hemoglobin: 10.9 g/dL — ABNORMAL LOW (ref 13.0–17.0)
MCH: 27.3 pg (ref 26.0–34.0)
MCHC: 31.5 g/dL (ref 30.0–36.0)
MCV: 86.7 fL (ref 78.0–100.0)
Platelets: 726 10*3/uL — ABNORMAL HIGH (ref 150–400)
RBC: 3.99 MIL/uL — ABNORMAL LOW (ref 4.22–5.81)
RDW: 14.5 % (ref 11.5–15.5)
WBC: 22.1 10*3/uL — ABNORMAL HIGH (ref 4.0–10.5)

## 2017-04-26 LAB — MAGNESIUM: Magnesium: 2.2 mg/dL (ref 1.7–2.4)

## 2017-04-26 LAB — PROCALCITONIN

## 2017-04-26 MED ORDER — POTASSIUM PHOSPHATES 15 MMOLE/5ML IV SOLN
30.0000 mmol | Freq: Once | INTRAVENOUS | Status: AC
Start: 2017-04-26 — End: 2017-04-26
  Administered 2017-04-26: 30 mmol via INTRAVENOUS
  Filled 2017-04-26: qty 10

## 2017-04-26 MED ORDER — POTASSIUM CHLORIDE 20 MEQ/15ML (10%) PO SOLN
20.0000 meq | Freq: Every day | ORAL | Status: DC
Start: 2017-04-26 — End: 2017-04-26

## 2017-04-26 MED ORDER — AMIODARONE HCL 200 MG PO TABS
200.0000 mg | ORAL_TABLET | Freq: Two times a day (BID) | ORAL | Status: DC
  Administered 2017-04-26 – 2017-04-27 (×2): 200 mg
  Filled 2017-04-26 (×2): qty 1

## 2017-04-26 MED ORDER — RESOURCE THICKENUP CLEAR PO POWD
ORAL | Status: DC | PRN
  Filled 2017-04-26: qty 125

## 2017-04-26 MED ORDER — POTASSIUM CHLORIDE 10 MEQ/50ML IV SOLN
10.0000 meq | INTRAVENOUS | Status: AC
  Administered 2017-04-26 (×3): 10 meq via INTRAVENOUS
  Filled 2017-04-26 (×2): qty 50

## 2017-04-26 MED ORDER — POTASSIUM CHLORIDE 20 MEQ/15ML (10%) PO SOLN
20.0000 meq | Freq: Every day | ORAL | Status: AC
Start: 2017-04-26 — End: 2017-04-27
  Administered 2017-04-26 – 2017-04-27 (×2): 20 meq
  Filled 2017-04-26 (×2): qty 15

## 2017-04-26 NOTE — Progress Notes (Signed)
Modified Barium Swallow Progress Note  Patient Details  Name: Ronald HoveGary [name] MRN: [id number] Date of Birth: Feb 25, 1951  Today's Date: 04/26/2017  Modified Barium Swallow completed.  Full report located under Chart Review in the Imaging Section.  Brief recommendations include the following:  Clinical Impression  Pt has a moderate oropharyngeal dysphagia that is likely acute and reversible in nature from his prolonged intubation and weak cough, which also appears guarded due to pain from his incision site. He has some lingual rocking and reduced cohesion orally with prolonged mastication of soft solids. His oral and pharyngeal clearance is very good, but he has reduced airway closure with deep penetration and/or aspiration with all liquid consistencies that he cannot clear. Recommend Dys 1 diet and pudding thick liquids with good prognosis for advancement with additional time post-extubation and improving cough strength.   Swallow Evaluation Recommendations       SLP Diet Recommendations: Dysphagia 1 (Puree) solids;Pudding thick liquid   Liquid Administration via: Spoon   Medication Administration: Crushed with puree   Supervision: Patient able to self feed;Full supervision/cueing for compensatory strategies   Compensations: Slow rate;Small sips/bites   Postural Changes: Seated upright at 90 degrees   Oral Care Recommendations: Oral care BID   Other Recommendations: Order thickener from pharmacy;Prohibited food (jello, ice cream, thin soups);Remove water pitcher    [name], [name] 04/26/2017,4:32 PM   [name], M.A. CCC-SLP [phone]

## 2017-04-26 NOTE — Progress Notes (Signed)
EKG CRITICAL VALUE     12 lead EKG performed.  Critical value notedPatricia [name]. RN notified.   Ronald EldersCorazon [name] [name], [place] 04/26/2017 9:07 AM

## 2017-04-26 NOTE — Progress Notes (Signed)
Physical Therapy Treatment Patient Details Name: [name] MRN: [id number] DOB: 29-May-1951 Today's Date: 04/26/2017    History of Present Illness Pt is a 66 y.o. male with PMH of remote MI, HTN, COPD called EMS on 04/11/17 with c/o chest pain and found to have STEMI. Intubated 11/14-11/26. Underwent thrombectomy and angioplasty with no reflow; intra-aortic balloon pump placed. Had LV pseudoaneurysm with placement of 5.0 Impella; removal of Impella on 11/19 secondary to clotted pump.    PT Comments    Pt performed increased gait but remains distracted and anxious during session.  Pt BP pre session 147/80 and post session 134/76.  Plan next session to encourage increased gait training and activity tolerance.  Plan remains appropriate for CIR therapies to improve strength and function before returning to private residence.      Follow Up Recommendations  CIR;Supervision/Assistance - 24 hour     Equipment Recommendations  Other (comment)(TBD at next venue)    Recommendations for Other Services Rehab consult;OT consult     Precautions / Restrictions Precautions Precautions: Sternal;Fall Precaution Comments: Pt has difficulty following precautions despite cues and use of heart pillow.   Restrictions Weight Bearing Restrictions: Yes Other Position/Activity Restrictions: Sternal precautions    Mobility  Bed Mobility Overal bed mobility: Needs Assistance             General bed mobility comments: Pt sitting in recliner on arrival but requires assistance to elevate trunk from back of chair to prepare for transfer.  Pt reaching for arms rests to pull and instructed to avoid pulling.  Constant redirection to hug heart pillow.    Transfers Overall transfer level: Needs assistance Equipment used: 4-wheeled walker(EVA walker) Transfers: Sit to/from Stand Sit to Stand: Mod assist;+2 safety/equipment         General transfer comment: Pt remains to require education to adhere to  sternal precautions during sit to stand transfers.  Pt required holding of heart pillow and rocking momentum to achieve standing.  Pt reports wanting to return to sit after standing and required max encouragement to remain standing.    Ambulation/Gait Ambulation/Gait assistance: Mod assist;+2 safety/equipment Ambulation Distance (Feet): 8 Feet Assistive device: 4-wheeled walker(EVA walker) Gait Pattern/deviations: Shuffle;Trunk flexed;Step-through pattern Gait velocity: Decreased Gait velocity interpretation: Below normal speed for age/gender General Gait Details: Pt remains anxious and became agitated after standing.  Encouraged patient to ambulate and required assistance with EVA walker and cues for LE progression to advance steps forward.  Pt performed 8 ft then refused further gait training.  Pt required cues for safety to back to seated surface before sitting.     Stairs            Wheelchair Mobility    Modified Rankin (Stroke Patients Only)       Balance Overall balance assessment: Needs assistance   Sitting balance-Leahy Scale: Fair       Standing balance-Leahy Scale: Poor                              Cognition Arousal/Alertness: Awake/alert Behavior During Therapy: Anxious Overall Cognitive Status: No family/caregiver present to determine baseline cognitive functioning Area of Impairment: Attention;Following commands;Safety/judgement;Awareness;Problem solving                   Current Attention Level: Sustained   Following Commands: Follows one step commands inconsistently Safety/Judgement: Decreased awareness of safety;Decreased awareness of deficits Awareness: Emergent Problem Solving: Requires verbal cues General Comments:  Pt remains distracted during session.  Pt remains fixated on his O2 and wanted to know how much he was on, and how much this was going to cost.  he also reports seeing ice cubes on the floor despite nothing being on  the floor, he insisted the ice was there.  Informed nursing of hallucinations.        Exercises      General Comments        Pertinent Vitals/Pain Pain Assessment: Faces Faces Pain Scale: Hurts little more Pain Location: Sternal incision Pain Descriptors / Indicators: Grimacing;Guarding Pain Intervention(s): Monitored during session;Repositioned    Home Living                      Prior Function            PT Goals (current goals can now be found in the care plan section) Acute Rehab PT Goals Patient Stated Goal: None stated Progress towards PT goals: Progressing toward goals    Frequency    Min 3X/week      PT Plan Current plan remains appropriate    Co-evaluation              AM-PAC PT "6 Clicks" Daily Activity  Outcome Measure  Difficulty turning over in bed (including adjusting bedclothes, sheets and blankets)?: Unable Difficulty moving from lying on back to sitting on the side of the bed? : Unable Difficulty sitting down on and standing up from a chair with arms (e.g., wheelchair, bedside commode, etc,.)?: Unable Help needed moving to and from a bed to chair (including a wheelchair)?: A Lot Help needed walking in hospital room?: A Lot Help needed climbing 3-5 steps with a railing? : Total 6 Click Score: 8    End of Session Equipment Utilized During Treatment: Oxygen Activity Tolerance: Other (comment)(anxiety and distraction hinder activity.  ) Patient left: with call bell/phone within reach;with nursing/sitter in room;in chair;with bed alarm set Nurse Communication: Mobility status PT Visit Diagnosis: Other abnormalities of gait and mobility (R26.89);Muscle weakness (generalized) (M62.81)     Time: [id number] PT Time Calculation (min) (ACUTE ONLY): 30 min  Charges:  $Gait Training: 8-22 mins $Therapeutic Activity: 8-22 mins                    G Codes:       Joycelyn Ruaimee Samir Ishaq, PTA pager [phone]    Florestine Aversimee J Landyn Buckalew 04/26/2017,  9:55 AM

## 2017-04-26 NOTE — Progress Notes (Addendum)
Patient ID: [name], male   DOB: 01-15-1951, 66 y.o.   MRN: [id number] TCTS DAILY ICU PROGRESS NOTE                   [address]            [place] [id number]          [phone]   9 Days Post-Op Procedure(s) (LRB): REMOVAL OF IMPELLA LEFT VENTRICULAR ASSIST DEVICE (N/A)  Total Length of Stay:  LOS: 15 days   Subjective: Patient sitting in chair. He states Robitussin is helping with cough/sputum.  Objective: Vital signs in last 24 hours: Temp:  [99.1 F (37.3 C)-100.2 F (37.9 C)] 100.2 F (37.9 C) (11/28 0700) Pulse Rate:  [87-98] 97 (11/28 0700) Cardiac Rhythm: Normal sinus rhythm (11/28 0400) Resp:  [21-41] 31 (11/28 0700) BP: (116-169)/(52-119) 159/62 (11/28 0700) SpO2:  [95 %-99 %] 96 % (11/28 0700) Arterial Line BP: (99-114)/(56-65) 99/56 (11/27 1000) Weight:  [194 lb 3.6 oz (88.1 kg)] 194 lb 3.6 oz (88.1 kg) (11/28 0500)  Filed Weights   04/24/17 0400 04/25/17 0449 04/26/17 0500  Weight: 194 lb 10.7 oz (88.3 kg) 186 lb 11.7 oz (84.7 kg) 194 lb 3.6 oz (88.1 kg)    Weight change: 7 lb 7.9 oz (3.4 kg)   Hemodynamic parameters for last 24 hours: CVP:  [2 mmHg-4 mmHg] 4 mmHg  Intake/Output from previous day: 11/27 0701 - 11/28 0700 In: 2393 [I.V.:243; NG/GT:1250; IV Piggyback:900] Out: 2505 [Urine:2355; Stool:150]  Intake/Output this shift: No intake/output data recorded.  Current Meds: Scheduled Meds: . amiodarone  400 mg Per Tube BID  . aspirin  81 mg Per Tube Daily  . atorvastatin  80 mg Per Tube q1800  . bisacodyl  10 mg Oral Daily   Or  . bisacodyl  10 mg Rectal Daily  . Chlorhexidine Gluconate Cloth  6 each Topical Daily  . enoxaparin (LOVENOX) injection  40 mg Subcutaneous Q24H  . feeding supplement (PRO-STAT SUGAR FREE 64)  30 mL Per Tube BID  . insulin aspart  0-24 Units Subcutaneous Q4H  . mouth rinse  15 mL Mouth Rinse BID  . pantoprazole sodium  40 mg Per Tube Daily  . QUEtiapine  50 mg Per Tube BID  . sodium  chloride flush  10-40 mL Intracatheter Q12H  . sodium chloride flush  10-40 mL Intracatheter Q12H  . sodium chloride flush  3 mL Intravenous Q12H  . ticagrelor  90 mg Per Tube BID   Continuous Infusions: . sodium chloride Stopped (04/17/17 2130)  . sodium chloride    . sodium chloride 10 mL/hr at 04/26/17 0700  . sodium chloride    . feeding supplement (VITAL HIGH PROTEIN) 1,000 mL (04/26/17 0700)  . lactated ringers    . lactated ringers Stopped (04/23/17 1900)  . lactated ringers Stopped (04/17/17 2133)  . meropenem (MERREM) IV Stopped (04/26/17 0606)  . norepinephrine (LEVOPHED) Adult infusion Stopped (04/25/17 1228)  . potassium chloride 10 mEq (04/26/17 0618)  . vancomycin Stopped (04/26/17 0515)   PRN Meds:.sodium chloride, acetaminophen (TYLENOL) oral liquid 160 mg/5 mL, albuterol, fentaNYL (SUBLIMAZE) injection, [place] Cassatt's butt cream, guaiFENesin, lactated ringers, ondansetron (ZOFRAN) IV, sodium chloride flush, sodium chloride flush, sodium chloride flush  General appearance: alert, cooperative and no distress Heart: RRR Lungs: Tachypnea (rr high 20's-30's),dminshed at bases Abdomen: Soft, non tender, bowel sounds present Extremities: Trace LE edema Wound: Clean and dry  Lab Results: CBC: Recent Labs    04/24/17  0423 04/25/17 0337  WBC 20.0* 20.7*  HGB 10.8* 10.9*  HCT 34.3* 34.0*  PLT 642* 706*   BMET:  Recent Labs    04/25/17 0337 04/26/17 0330  NA 132* 136  K 3.5 3.2*  CL 97* 104  CO2 27 25  GLUCOSE 119* 96  BUN 26* 27*  CREATININE 0.65 0.66  CALCIUM 8.5* 8.5*    CMET: Lab Results  Component Value Date   WBC 20.7 (H) 04/25/2017   HGB 10.9 (L) 04/25/2017   HCT 34.0 (L) 04/25/2017   PLT 706 (H) 04/25/2017   GLUCOSE 96 04/26/2017   CHOL 167 04/11/2017   TRIG 109 04/11/2017   HDL 27 (L) 04/11/2017   LDLCALC 118 (H) 04/11/2017   ALT 22 04/23/2017   AST 19 04/23/2017   NA 136 04/26/2017   K 3.2 (L) 04/26/2017   CL 104 04/26/2017   CREATININE  0.66 04/26/2017   BUN 27 (H) 04/26/2017   CO2 25 04/26/2017   INR 1.27 04/18/2017      PT/INR: No results for input(s): LABPROT, INR in the last 72 hours. Radiology: No results found.   Assessment/Plan: S/P Procedure(s) (LRB): REMOVAL OF IMPELLA LEFT VENTRICULAR ASSIST DEVICE (N/A)  1. CV-S/p MI, LV pseudoaneurysm, and cardiogenic shock. Previous PAF. Maintaining SR. On oral Amiodarone (via tube) . Co ox 65.7 this am. Also, on Brilinta.  2. Pulmonary-Extubated 11/26. On 3 liters of oxygen via [place]. CXR this am appears to show small, stable right apical pneumothorax, cardiomegaly, and bibasilar atelectasis. Encourage incentive spirometer.  Per pulmonary/CCM  3. ID-ESBL, E.Coli. On Meropenem. Had fever to 100.2. Last WBC remains 20,000. Will re check in am 4. Supplement potassium 5. GI-NPO, TFs. Await swallow evaluation for diet recommendations.     Donielle Margaretann LovelessM Zimmerman PA-C 04/26/2017 7:19 AM   Slow improvement  Inpatient rehab when stronger and can do 3 hours activity a day  I have seen and examined [name] and agree with the above assessment  and plan.  Delight OvensEdward B Latrease Kunde MD Beeper [phone] Office [phone] 04/26/2017 3:02 PM

## 2017-04-26 NOTE — Progress Notes (Signed)
CT surgery p.m. Rounds  Patient with feeding tube after swallow study-recommend dysphasia 1 diet Out of bed to chair with assist 3 Neuro intact Making continued steady progress

## 2017-04-26 NOTE — Progress Notes (Signed)
Advanced Heart Failure Rounding Note   Subjective:    - S/P LV pseudoaneurysm repeat 11/14 Impella 5.0 Placed.  - Chest closed 11/16.  - Started on broad spectrum abx on 11/17 for fever and leukocytosis.  - Impella noted to have poor waveform am of 11/19. Thought to have possible clot.  Heparin restarted and pump pulled afternoon of 04/17/17. - Extubated 11/26.  Off NE.   Sitting up in chair. Feeling ok but very weak (took 3 people to pivot him to chair)  Denies SOB.    Objective:   Weight Range: 194 lb 3.6 oz (88.1 kg) Body mass index is 30.42 kg/m.   Vital Signs:   Temp:  [98.2 F (36.8 C)-100.2 F (37.9 C)] 98.2 F (36.8 C) (11/28 0834) Pulse Rate:  [87-98] 94 (11/28 0800) Resp:  [23-41] 23 (11/28 0800) BP: (116-169)/(52-119) 132/118 (11/28 0800) SpO2:  [95 %-99 %] 99 % (11/28 0800) Arterial Line BP: (99)/(56) 99/56 (11/27 1000) Weight:  [194 lb 3.6 oz (88.1 kg)] 194 lb 3.6 oz (88.1 kg) (11/28 0500) Last BM Date: 04/26/17  Weight change: Filed Weights   04/24/17 0400 04/25/17 0449 04/26/17 0500  Weight: 194 lb 10.7 oz (88.3 kg) 186 lb 11.7 oz (84.7 kg) 194 lb 3.6 oz (88.1 kg)   Intake/Output:   Intake/Output Summary (Last 24 hours) at 04/26/2017 0935 Last data filed at 04/26/2017 0900 Gross per 24 hour  Intake 2500 ml  Output 2350 ml  Net 150 ml    Physical Exam   CVP 3  General:  No resp difficulty. Sitting in the chair.  HEENT: normal except [place]  Neck: supple. no JVD. Carotids 2+ bilat; no bruits. No lymphadenopathy or thryomegaly appreciated. Cor: PMI nondisplaced. Regular rate & rhythm. No rubs, gallops or murmurs. Lungs: clear. Sternal dressing.  Abdomen: soft, nontender, nondistended. No hepatosplenomegaly. No bruits or masses. Good bowel sounds. Extremities: no cyanosis, clubbing, rash, edema. RUE PICC Neuro: alert & orientedx3, cranial nerves grossly intact. Very weak . Affect pleasant GU: foley    Telemetry   NSR 90s personally  reviewed.  EKG   N/A  Labs    CBC Recent Labs    04/24/17 0423 04/25/17 0337  WBC 20.0* 20.7*  NEUTROABS 16.4*  --   HGB 10.8* 10.9*  HCT 34.3* 34.0*  MCV 89.1 86.3  PLT 642* 706*   Basic Metabolic Panel Recent Labs    16/02/9610/26/18 0424 04/25/17 0337 04/26/17 0330  NA 130* 132* 136  K 3.8 3.5 3.2*  CL 93* 97* 104  CO2 29 27 25   GLUCOSE 139* 119* 96  BUN 34* 26* 27*  CREATININE 0.80 0.65 0.66  CALCIUM 8.3* 8.5* 8.5*  MG 2.3  --   --   PHOS 2.7 1.3* 2.2*   Liver Function Tests Recent Labs    04/25/17 0337 04/26/17 0330  ALBUMIN 2.2* 2.2*   No results for input(s): LIPASE, AMYLASE in the last 72 hours. Cardiac Enzymes No results for input(s): CKTOTAL, CKMB, CKMBINDEX, TROPONINI in the last 72 hours.  BNP: BNP (last 3 results) No results for input(s): BNP in the last 8760 hours.  ProBNP (last 3 results) No results for input(s): PROBNP in the last 8760 hours.   D-Dimer No results for input(s): DDIMER in the last 72 hours. Hemoglobin A1C No results for input(s): HGBA1C in the last 72 hours. Fasting Lipid Panel No results for input(s): CHOL, HDL, LDLCALC, TRIG, CHOLHDL, LDLDIRECT in the last 72 hours. Thyroid Function Tests No results  for input(s): TSH, T4TOTAL, T3FREE, THYROIDAB in the last 72 hours.  Invalid input(s): FREET3  Other results:   Imaging    Dg Chest Port 1 View  Result Date: 04/26/2017 CLINICAL DATA:  Pleural effusion EXAM: PORTABLE CHEST 1 VIEW COMPARISON:  04/25/2017 FINDINGS: Right arm PICC tip in the SVC in good position. Right jugular central venous catheter tip removed. Feeding tube enters the stomach with the tip not visualized Cardiac enlargement with median sternotomy wires. Negative for heart failure Improved aeration in the lung bases. Right lung base clear. Improvement in left lower lobe atelectasis/ infiltrate. Small left effusion. IMPRESSION: Improved aeration in the lung bases with persistent left lower lobe airspace  disease and small left effusion. Negative for heart failure. Electronically Signed   By: Marlan Palauharles  Clark M.D.   On: 04/26/2017 09:04     Medications:     Scheduled Medications: . amiodarone  400 mg Per Tube BID  . aspirin  81 mg Per Tube Daily  . atorvastatin  80 mg Per Tube q1800  . bisacodyl  10 mg Oral Daily   Or  . bisacodyl  10 mg Rectal Daily  . Chlorhexidine Gluconate Cloth  6 each Topical Daily  . enoxaparin (LOVENOX) injection  40 mg Subcutaneous Q24H  . feeding supplement (PRO-STAT SUGAR FREE 64)  30 mL Per Tube BID  . insulin aspart  0-24 Units Subcutaneous Q4H  . mouth rinse  15 mL Mouth Rinse BID  . pantoprazole sodium  40 mg Per Tube Daily  . potassium chloride  20 mEq Per Tube Daily  . QUEtiapine  50 mg Per Tube BID  . sodium chloride flush  10-40 mL Intracatheter Q12H  . sodium chloride flush  10-40 mL Intracatheter Q12H  . sodium chloride flush  3 mL Intravenous Q12H  . ticagrelor  90 mg Per Tube BID    Infusions: . sodium chloride    . sodium chloride 10 mL/hr at 04/26/17 0800  . feeding supplement (VITAL HIGH PROTEIN) 1,000 mL (04/26/17 0700)  . lactated ringers Stopped (04/23/17 1900)  . meropenem (MERREM) IV Stopped (04/26/17 0606)  . potassium chloride 10 mEq (04/26/17 0917)  . potassium PHOSPHATE IVPB (mmol)    . vancomycin Stopped (04/26/17 0515)    PRN Medications: acetaminophen (TYLENOL) oral liquid 160 mg/5 mL, albuterol, fentaNYL (SUBLIMAZE) injection, Gerhardt's butt cream, guaiFENesin, ondansetron (ZOFRAN) IV, sodium chloride flush, sodium chloride flush, sodium chloride flush    Patient Profile   [name] is 66 year old with h/o MI, HTN, COPD admitted with chest pain. EKG with evidence of inferior/lateral STEMI. Due to lethargy and hypoxemia he was intubated in the ED.  Taken to cath lab with RCA DES and POBA to chronically occluded LAD with IABP placed.   Had LV pseudo aneurysm repair 11/14 .   Assessment/Plan   1. CAD with OOH  anterior MI c/b LV pseudoaneurysm and cardiogenic shock - attempted PCI of LAD failed due to chronic occlusion and extensive infarct - s/p PCI/DES to distal RCA Remains on ASA, brilinta, atorvastatin - stable no s/s ischemia  2.  S/P LV pseudoaneurysm repair 11/14 - Chest closed 04/13/17. Stable.   3. Acute systolic HF-> cardiogenic shock - Impella removed 04/17/17 - EF improved 25%->40% on recent intra-op TEE - Off NE. Stable.  CVP 3. No lasix for now. .     4. Acute Hypoxic Respiratory Failure  - Intubated 11/13.  - Has severe COPD. Baseline PCO2 60s  - Extubated 11/26  5.  AKI  - Due to ATN shock  - Resolved.  -Creatinine 0.66    6. COPD with chronic CO2 retention  - Stable. No wheeze.   7. ESBL E coli culture -04/15/17 ESBL from skin culture (penile meatus) - Off vanc. On meropenem.   - CBC in am.  - Tmax 100.2 .   8. PAF - In NSR. Continue amio 400 mg twice a day.   9. Hypokalemia - K 3.2 Supplement K  Repeat BMET in am.   10. Severe Deconditioning Consult PT/OT. CIR consult.   Length of Stay: 15  Ronald Clegg, NP  04/26/2017, 9:35 AM  Advanced Heart Failure Team Pager [phone] (M-F; 7a - 4p)   Please contact [facility] for night-coverage after hours (4p -7a ) and weekends on [email or web address]   Patient seen and examined with Ronald Becket, NP. We discussed all aspects of the encounter. I agree with the assessment and plan as stated above.   Up to chair but very weak. Still with low grade temps and WBC climbing slightly. CXR ok (viewed personally). Will repeat blood and urine cx. Maintaining NSR. Can decrease amio to 200 bid. Will need aggressive rehab as he is extremely debilitated. Will hold lasix one more day. Consider repeat echo prior to d/c.   Ronald Meres, MD  10:33 AM

## 2017-04-26 NOTE — Plan of Care (Signed)
Patient is progressing. AAO x3-person, place, time- follows commands w/ periods of confusion/ Will continue to work with PT and SLP to increase activity and improve pt's ability to tolerate PO.

## 2017-04-26 NOTE — Progress Notes (Signed)
Tonye BecketAmy Clegg, NP notified of EKG results: STEMI and requested that she review this morning's EKG.  Will continue to monitor pt closely.

## 2017-04-26 NOTE — Progress Notes (Signed)
  Speech Language Pathology   Patient Details Name: [name] MRN: [id number] DOB: Apr 20, 1951 Today's Date: 04/26/2017 Time:  -       MBS scheduled for pt today at 2:00     [facility] [name] HerrimanLitaker M.Ed [employer]-SLP Pager [phone]

## 2017-04-26 NOTE — Plan of Care (Signed)
  Progressing Activity: Risk for activity intolerance will decrease 04/26/2017 2030 - Progressing by Marin CommentFrei, Sabin Gibeault C, RN Note Pt was able to tolerate sitting up in chair for multiple hours today. Pt is a heavy 2-3 assist.  Cardiac: Hemodynamic stability will improve 04/26/2017 2030 - Progressing by Marin CommentFrei, Osa Fogarty C, RN Note Pt is off vasopressors and his maintaining adequate heart rate and blood pressure.  Skin Integrity: Wound healing without signs and symptoms of infection 04/26/2017 2030 - Progressing by Marin CommentFrei, Hailei Besser C, RN Note Pts sternal incision look clean, dry, and intact.

## 2017-04-26 NOTE — Progress Notes (Signed)
Inpatient Rehabilitation  Per PT request, patient was screened by Arvel Oquinn for appropriateness for an Inpatient Acute Rehab consult.  At this time we are recommending an Inpatient Rehab consult as well as OT orders.  Please order if you are agreeable.    [name], M.A., CCC/SLP Admission Coordinator  [facility]  Cell [phone]  

## 2017-04-26 NOTE — Progress Notes (Signed)
PULMONARY / CRITICAL CARE MEDICINE   Name: [name] MRN: [id number] DOB: [birth year]    ADMISSION DATE:  04/11/2017 CONSULTATION DATE:  04/11/17  REFERRING MD:  Irish Lack  CHIEF COMPLAINT:  STEMI  HISTORY OF PRESENT ILLNESS:  [name] is a 66 y.o. male with PMH of CAD and HTN.  He presented to [facility] ED 11/13 with sudden onset of SOB and chest pain.  He was found to have inferolateral STEMI and was taken urgently to cath lab.  Prior to cath lab, he was intubated; therefore, PCCM asked to see in consultation to assist with vent management.  In cath lab, he was found to have 100% stenosed mid LAD lesion that was balloon angioplastied with 25% residual stenosis, distal RCA lesion which had DES placed, Ost 1st diag lesion 50% stenosed, proximal ramus 70% stenosed.  IABP was also placed. Has had ongoing shock, pressor dependent.   SUBJECTIVE:  No acute events.  VITAL SIGNS: BP (!) 159/62   Pulse 97   Temp 98.2 F (36.8 C) (Oral)   Resp (!) 31   Ht _0  (1.702 m)   Wt 88.1 kg (194 lb 3.6 oz)   SpO2 96%   BMI 30.42 kg/m   HEMODYNAMICS: CVP:  [2 mmHg-4 mmHg] 3 mmHg  VENTILATOR SETTINGS:    INTAKE / OUTPUT:  Intake/Output Summary (Last 24 hours) at 04/26/2017 0851 Last data filed at 04/26/2017 0700 Gross per 24 hour  Intake 2393 ml  Output 2505 ml  Net -112 ml   Filed Weights   04/24/17 0400 04/25/17 0449 04/26/17 0500  Weight: 88.3 kg (194 lb 10.7 oz) 84.7 kg (186 lb 11.7 oz) 88.1 kg (194 lb 3.6 oz)   PHYSICAL EXAMINATION:  General: Elderly male, sitting in recliner, in NAD. HEENT: [place]/AT, PERRL, no appreciable JVD. Neuro: A&O x 3, Moves all extremities x 4. CV: RRR, no MRG. PULM: Normal resp effort.  Coarse bases. FT:DDUK, diffuse tenderness, bsx4 active.  Extremities: warm/dry, bilateral upper extremity edema.  Skin: no rashes or lesions.  LABS:  BMET Recent Labs  Lab 04/24/17 0424 04/25/17 0337 04/26/17 0330  NA 130* 132* 136  K 3.8 3.5 3.2*  CL 93*  97* 104  CO2 _1 BUN 34* 26* 27*  CREATININE 0.80 0.65 0.66  GLUCOSE 139* 119* 96   Electrolytes Recent Labs  Lab 04/21/17 0333  04/23/17 0416 04/24/17 0424 04/25/17 0337 04/26/17 0330  CALCIUM 8.6*   < > 8.5* 8.3* 8.5* 8.5*  MG 2.1  --  2.2 2.3  --   --   PHOS 3.1  --   --  2.7 1.3* 2.2*   < > = values in this interval not displayed.   CBC Recent Labs  Lab 04/23/17 0416 04/24/17 0423 04/25/17 0337  WBC 17.7* 20.0* 20.7*  HGB 11.0* 10.8* 10.9*  HCT 34.3* 34.3* 34.0*  PLT 567* 642* 706*   Coag's No results for input(s): APTT, INR in the last 168 hours. Sepsis Markers Recent Labs  Lab 04/25/17 0941 04/26/17 0330  PROCALCITON <0.10 <0.10   ABG Recent Labs  Lab 04/23/17 0450 04/24/17 0620 04/25/17 0345  PHART 7.387 7.380 7.531*  PCO2ART 58.2* 52.5* 35.5  PO2ART 95.4 111* 72.2*   Liver Enzymes Recent Labs  Lab 04/23/17 0416 04/24/17 0424 04/25/17 0337 04/26/17 0330  AST 19  --   --   --   ALT 22  --   --   --   ALKPHOS 62  --   --   --  BILITOT 0.7  --   --   --   ALBUMIN 2.3* 2.3* 2.2* 2.2*   Cardiac Enzymes No results for input(s): TROPONINI, PROBNP in the last 168 hours. Glucose Recent Labs  Lab 04/25/17 1136 04/25/17 1542 04/25/17 2012 04/25/17 2332 04/26/17 0347 04/26/17 0831  GLUCAP 113* 129* 113* 113* 88 116*    Imaging No results found. STUDIES:  CXR 11/13 > possible PNA. Echo TEE 11/16 >     Right ventricle: Prior to chest closure, the RV size and systolic function appeared adequate. Following chest closure there was some reduction RV systolic function, but there was no RV enlargement or septal shift.  Mitral valve: The mitral valve leaflets were of normal thickness. There was good leaflet separation. There was normal leaflet coptation without prolapsing or flail leaflet segments. There was trace mitral insufficiency. The impella cannula did not interfere with the mitral valve apparatus.  Tricuspid valve: Trace  regurgitation. The tricuspid valve regurgitation jet is central.   CULTURES: Blood 11/13 > 1/2 with E. coli Sputum 11/13 > negative Wound 11/17>> ecoli  ANTIBIOTICS: Ceftriaxone 11/13 > 11/17 Azithromycin 11/13 > 11/17 vanc 11/17>>>11/20 Zosyn 11/17>>>11/20 Meropenem 04/18/2017>>  SIGNIFICANT EVENTS: 11/13 > admit.  With resection of left ventricular aneurysm and placement of 5.0 Impella 04/17/2017 removal of clotted impella  pump  LINES/TUBES: ETT 11/13 >  R femoral sheath 11/13 >  R IJ CVL (bensimohn) 11/14>>>  DISCUSSION: 66 y.o. male admitted 11/13 with inferolateral STEMI.  He was intubated and was then taken to cath lab where he was found to have 100% stenosed mid LAD lesion that was balloon angioplastied with 25% residual stenosis, distal RCA lesion which had DES placed, Ost 1st diag lesion 50% stenosed, proximal ramus 70% stenosed.  IABP was also placed.  On f/u echo had ?MV rupture - taken urgently to OR.  Had LV pseudoaneurysm rupture, repaired, impala placed.  Prolonged ICU stay followed. He remained on vent until 11/26 and continues on pressors.   ASSESSMENT / PLAN:  PULMONARY A: Acute respiratory failure - r/t STEMI ? PNA - bilateral patchiness - ?aspiration v edema  P:   Supplemental O2 to keep sats > 90%. Diuresis per advanced CHF team. Abx as below. Incentive spirometry and flutter valve. Mobilize as able. Intermittent CXR.  CARDIOVASCULAR A:  Inferolateral STEMI - s/p cath lab where he was found to have 100% stenosed mid LAD lesion that was balloon angioplastied with 25% residual stenosis, distal RCA lesion which had DES placed, Ost 1st diag lesion 50% stenosed, proximal ramus 70% stenosed.  IABP was also placed. LV pseudoaneurysm rupture - to OR emergently 11/14, chest closure 11/16 Hypotension - suspect sedation related (was normotensive on arrival to ICU but was somewhat agitated so got additional sedation and later became hypotensive). Hx HTN,  CAD. P:  Cardiology/CVTS managing. Titrate levophed for MAP > 65 mmHg - now off pressors, will d/c. Diuresis per CHF team. Continue amiodarone.  Anticoagulation per cards - bivalirudin, cangrelor.   RENAL A:   Hypokalemia Hyponatremia - resolved AKI - resolved Met alkalosis - resolved Hypophosphatemia P:   Follow BMP. Volume management per heart failure team.  Kphos 53mol today.  GASTROINTESTINAL A:   GI prophylaxis Nutrition P:   SUP: Pantoprazole. Continue TF's. MBS today 11/28.  HEMATOLOGIC A:   VTE Prophylaxis P:  Anticoagulation as above. CBC daily.  INFECTIOUS A:   Possible PNA. Perineal wound infection E.coli > sens to imipenem.  P:   Continue meropenem through 11/30 for  10 days total.  ENDOCRINE A:   No acute issues P:   Monitor glucose on chem.   NEUROLOGIC A:   Sedation due to mechanical ventilation - s/p extubation. Agitation/anxiety > improving P:   PRN fentanyl for pain management. OT/PT/SLP.  Nothing further to add at this point.  PCCM will sign off.  Please do not hesitate to call us back if we can be of any further assistance.  [name], [facility] Pager: [phone]  or [phone] 04/26/2017, 8:58 AM  Attending Note:  66 year old male s/p cardiac surgery that was intubated for a long time and was extubated on 11/27.  On exam, lungs are clear.  I reviewed CXR myself, pulmonary edema noted.  Discussed with PCCM-NP.  Respiratory failure:  - Monitor for airway protection given prolonged intubation  Hypoxemia:  - Titrate O2 for sat of 88-92%  - May need an ambulatory desaturation study prior to discharge for home O2.  Pulmonary edema:  - Continue diureses as orderd  - Replace electrolytes  - BMET  Cardiogenic shock:  - Titrate pressors to off as able  - Cardiology following  PCCM will sign off at this point, please call back if needed.  Patient seen and examined,  agree with above note.  I dictated the care and orders written for this patient under my direction.  Rush Farmer, [place]

## 2017-04-26 NOTE — Progress Notes (Signed)
Rutherford Guysahul Desai, PA at bedside and notified of EKG stating Critical Test Result:  STEMI.  EKG reviewed and compared to yesterday's EKG.  No orders received at this time.  Will continue to monitor pt closely.

## 2017-04-26 NOTE — Consult Note (Signed)
Physical Medicine and Rehabilitation Consult   Reason for Consult: Debility due to  STEMI/Cardiogenic shock/CABG/ infection.  Referring Physician: Dr. Haroldine Laws.    HPI: [name] is a 66 y.o. male with history of CAD, HTN, COPD, chronic neck pain;  who was admitted on 04/11/17 with SOB and malaise due to inferior lateral STEMI, lethargy and cardiogenic shock requiring intubation in ED. History taken from chart review and sister.  He underwent cardiac cath with RCA DES and balloon angioplasty of mid LAD. He required IABP as well as neo due to hypotension. Echo showed diffuse hypokinesis with moderate pericardial effusion and EF 25-30%.  Dr. Servando Snare consulted due to concerns of LV rupture and TEE showed contained rupture with extensive thrombus. He was taken to OR for CABG with resection of LV aneurysm with placement of impella 11/14 and underwent chest closure on 11/17.  Hospital course significant for E coli in 1/2 blood cultures and wound--antibiotic coverage broadened to meropenum but with upward trend in temps today. He has been weaned of Neo and treated for acute on chronic systolic CHF.  He tolerated extubation on 11/26 and remains NPO due signs of weak cough and signs of dysphagia--MBS schedule for today. Therapy evaluations done yesterday revealing debility and CIR recommended due to functional deficits.     Review of Systems  Constitutional: Positive for malaise/fatigue. Negative for chills and fever.  HENT: Negative for hearing loss and tinnitus.   Eyes: Negative for blurred vision and double vision.  Respiratory: Positive for shortness of breath. Negative for wheezing.   Cardiovascular: Positive for chest pain and leg swelling.  Gastrointestinal: Negative for heartburn and nausea.  Genitourinary:       Foley pulled today.   Musculoskeletal: Positive for myalgias and neck pain.  Neurological: Negative for focal weakness and headaches.  Psychiatric/Behavioral: The patient  is not nervous/anxious.   All other systems reviewed and are negative.   Past Medical History:  Diagnosis Date  . Coronary artery disease   . Hypertension     Past Surgical History:  Procedure Laterality Date  . APPLICATION OF WOUND VAC N/A 04/14/2017   Procedure: APPLICATION OF WOUND VAC with reposition of Impella device;  Surgeon: Grace Isaac, MD;  Location: [place];  Service: Thoracic;  Laterality: N/A;  . CORONARY STENT INTERVENTION N/A 04/11/2017   Procedure: CORONARY STENT INTERVENTION;  Surgeon: Jettie Booze, MD;  Location: [place] [facility];  Service: Cardiovascular;  Laterality: N/A;  . CORONARY THROMBECTOMY N/A 04/11/2017   Procedure: Coronary Thrombectomy;  Surgeon: Jettie Booze, MD;  Location: [place] [facility];  Service: Cardiovascular;  Laterality: N/A;  . CORONARY/GRAFT ACUTE MI REVASCULARIZATION N/A 04/11/2017   Procedure: Coronary/Graft Acute MI Revascularization;  Surgeon: Jettie Booze, MD;  Location: [place] [facility];  Service: Cardiovascular;  Laterality: N/A;  . IABP INSERTION N/A 04/11/2017   Procedure: IABP Insertion;  Surgeon: Jettie Booze, MD;  Location: [place] [facility];  Service: Cardiovascular;  Laterality: N/A;  . LEFT HEART CATH AND CORONARY ANGIOGRAPHY N/A 04/11/2017   Procedure: LEFT HEART CATH AND CORONARY ANGIOGRAPHY;  Surgeon: Jettie Booze, MD;  Location: [place] [facility];  Service: Cardiovascular;  Laterality: N/A;  . PLACEMENT OF IMPELLA LEFT VENTRICULAR ASSIST DEVICE  04/12/2017   Procedure: PLACEMENT OF IMPELLA  LD;  Surgeon: Grace Isaac, MD;  Location: [place];  Service: Open Heart Surgery;;  . REMOVAL OF IMPELLA LEFT VENTRICULAR ASSIST DEVICE N/A 04/17/2017  Procedure: REMOVAL OF IMPELLA LEFT VENTRICULAR ASSIST DEVICE;  Surgeon: Grace Isaac, MD;  Location: [place];  Service: Open Heart Surgery;  Laterality: N/A;  . REPAIR OF RIGHT VENTRICLE LACERATION Left 04/12/2017   Procedure:  Resection of LV Aneurysm;  Surgeon: Grace Isaac, MD;  Location: [place];  Service: Open Heart Surgery;  Laterality: Left;  . STERNAL CLOSURE N/A 04/14/2017   Procedure: STERNAL CLOSURE;  Surgeon: Grace Isaac, MD;  Location: [place];  Service: Thoracic;  Laterality: N/A;  . TEE WITHOUT CARDIOVERSION N/A 04/14/2017   Procedure: TRANSESOPHAGEAL ECHOCARDIOGRAM (TEE);  Surgeon: Grace Isaac, MD;  Location: [facility];  Service: Thoracic;  Laterality: N/A;  . ULTRASOUND GUIDANCE FOR VASCULAR ACCESS  04/11/2017   Procedure: Ultrasound Guidance For Vascular Access;  Surgeon: Jettie Booze, MD;  Location: [place] [facility];  Service: Cardiovascular;;    History reviewed. No pertinent family history.    Social History:  Married. Retired. He reports that he has been smoking 2 PPD.  he has never used smokeless tobacco. He does not use alcohol or illiict drugs.     Allergies: No Known Allergies    No medications prior to admission.    Home: Home Living Family/patient expects to be discharged to:: Private residence Living Arrangements: Alone Additional Comments: Pt with increased distraction throughout session; will need to get further home set-up, support, and PLOF details during next treatment session  Functional History: Prior Function Comments: Pt with increased distraction throughout session; will need to get further home set-up, support, and PLOF details during next treatment session Functional Status:  Mobility: Bed Mobility Overal bed mobility: Needs Assistance Bed Mobility: Sit to Supine Sit to supine: Min assist General bed mobility comments: Cues for log roll technique to maintain sternal precautions, minA to assist BLEs into bed. Cues for sequencing with scooting up in bed Transfers Overall transfer level: Needs assistance Equipment used: 4-wheeled walker(Eva walker) Transfers: Sit to/from Stand Sit to Stand: Min assist, +2 safety/equipment General transfer  comment: Educ on technique for standing with hands on knees to maintain sternal precautions, but pt still reaching out for walker upon standing. MinA to assist trunk elevation and maintain balance Ambulation/Gait Ambulation/Gait assistance: Mod assist, +2 safety/equipment Ambulation Distance (Feet): 5 Feet Assistive device: 4-wheeled walker(Eva walker) Gait Pattern/deviations: Step-to pattern General Gait Details: Pt with increased anxiety about O2 needs (SpO2 >95% throughout treatment) and lines/leads despite multiple attempts to redirect; pt also worried about BLE weakness with increased pushing through BUEs on RW despite multiple cues to correct. Able to amb from chair to bed with modA to steady walker, but pt unable to keep from pushing through BUEs Gait velocity: Decreased Gait velocity interpretation: <1.8 ft/sec, indicative of risk for recurrent falls    ADL:    Cognition: Cognition Overall Cognitive Status: No family/caregiver present to determine baseline cognitive functioning Orientation Level: Oriented X4 Cognition Arousal/Alertness: Awake/alert Behavior During Therapy: Anxious Overall Cognitive Status: No family/caregiver present to determine baseline cognitive functioning Area of Impairment: Attention, Following commands, Safety/judgement, Awareness, Problem solving Current Attention Level: Sustained Following Commands: Follows one step commands inconsistently Safety/Judgement: Decreased awareness of safety, Decreased awareness of deficits Awareness: Emergent Problem Solving: Requires verbal cues General Comments: Pt with increased distraction throughout session, very concerned/anxious about oxygen while fixating on the wall clock and apparently thinking it reflected his O2 needs (despite education regarding this). Also with increased distraction about his lines/leads throughout. Decreased ability to follow sternal precautions (when discussing importance of this,  pt began  talking about his mom's heart sx)  Blood pressure (!) 132/118, pulse 94, temperature 98.2 F (36.8 C), temperature source Oral, resp. rate (!) 23, height '5\' 7"'$  (1.702 m), weight 88.1 kg (194 lb 3.6 oz), SpO2 99 %. Physical Exam  Nursing note and vitals reviewed. Constitutional: He appears well-developed and well-nourished. No distress.  Cortak in place.   HENT:  Head: Normocephalic and atraumatic.  Mouth/Throat: Oropharynx is clear and moist.  +NG Poor dentition  Eyes: Conjunctivae and EOM are normal. Pupils are equal, round, and reactive to light.  Neck: Normal range of motion. Neck supple.  Cardiovascular: An irregularly irregular rhythm present.  Respiratory: Effort normal. No stridor. No respiratory distress. He has decreased breath sounds.  Weak cough +[place]  GI: Soft. Bowel sounds are normal. He exhibits no distension. There is no tenderness.  Musculoskeletal: He exhibits edema and tenderness.  Neurological: He is alert.  A&Ox2 Dysphonia noted.  Easily distracted and slow to process.  Able to follow simple motor commands.  Motor: B/l UE 4-/5 proximal to distal B/l LE: HF, KE 3+/5, ADF/PF 4/5  Skin: Skin is warm and dry. He is not diaphoretic.  Psychiatric: His affect is blunt. His speech is delayed. He is slowed.    Results for orders placed or performed during the hospital encounter of 04/11/17 (from the past 24 hour(s))  Glucose, capillary     Status: Abnormal   Collection Time: 04/25/17 11:36 AM  Result Value Ref Range   Glucose-Capillary 113 (H) 65 - 99 mg/dL   Comment 1 Capillary Specimen   Glucose, capillary     Status: Abnormal   Collection Time: 04/25/17  3:42 PM  Result Value Ref Range   Glucose-Capillary 129 (H) 65 - 99 mg/dL   Comment 1 Capillary Specimen    Comment 2 Notify RN   Glucose, capillary     Status: Abnormal   Collection Time: 04/25/17  8:12 PM  Result Value Ref Range   Glucose-Capillary 113 (H) 65 - 99 mg/dL  Glucose, capillary     Status:  Abnormal   Collection Time: 04/25/17 11:32 PM  Result Value Ref Range   Glucose-Capillary 113 (H) 65 - 99 mg/dL   Comment 1 Notify RN   Procalcitonin     Status: None   Collection Time: 04/26/17  3:30 AM  Result Value Ref Range   Procalcitonin <0.10 ng/mL  Renal function panel     Status: Abnormal   Collection Time: 04/26/17  3:30 AM  Result Value Ref Range   Sodium 136 135 - 145 mmol/L   Potassium 3.2 (L) 3.5 - 5.1 mmol/L   Chloride 104 101 - 111 mmol/L   CO2 25 22 - 32 mmol/L   Glucose, Bld 96 65 - 99 mg/dL   BUN 27 (H) 6 - 20 mg/dL   Creatinine, Ser 0.66 0.61 - 1.24 mg/dL   Calcium 8.5 (L) 8.9 - 10.3 mg/dL   Phosphorus 2.2 (L) 2.5 - 4.6 mg/dL   Albumin 2.2 (L) 3.5 - 5.0 g/dL   GFR calc non Af Amer >60 >60 mL/min   GFR calc Af Amer >60 >60 mL/min   Anion gap 7 5 - 15  Glucose, capillary     Status: None   Collection Time: 04/26/17  3:47 AM  Result Value Ref Range   Glucose-Capillary 88 65 - 99 mg/dL  .Cooxemetry Panel (carboxy, met, total hgb, O2 sat)     Status: Abnormal   Collection Time: 04/26/17  4:05  AM  Result Value Ref Range   Total hemoglobin 11.2 (L) 12.0 - 16.0 g/dL   O2 Saturation 65.7 %   Carboxyhemoglobin 1.2 0.5 - 1.5 %   Methemoglobin 1.1 0.0 - 1.5 %  Glucose, capillary     Status: Abnormal   Collection Time: 04/26/17  8:31 AM  Result Value Ref Range   Glucose-Capillary 116 (H) 65 - 99 mg/dL   Comment 1 Capillary Specimen    Dg Chest Port 1 View  Result Date: 04/26/2017 CLINICAL DATA:  Pleural effusion EXAM: PORTABLE CHEST 1 VIEW COMPARISON:  04/25/2017 FINDINGS: Right arm PICC tip in the SVC in good position. Right jugular central venous catheter tip removed. Feeding tube enters the stomach with the tip not visualized Cardiac enlargement with median sternotomy wires. Negative for heart failure Improved aeration in the lung bases. Right lung base clear. Improvement in left lower lobe atelectasis/ infiltrate. Small left effusion. IMPRESSION: Improved  aeration in the lung bases with persistent left lower lobe airspace disease and small left effusion. Negative for heart failure. Electronically Signed   By: Franchot Gallo M.D.   On: 04/26/2017 09:04   Dg Chest Port 1 View  Result Date: 04/25/2017 CLINICAL DATA:  66 year old male postoperative day 8 status post removal of clotted Impella pump which had been placed for cardiogenic shock. EXAM: PORTABLE CHEST 1 VIEW COMPARISON:  04/24/2017 and earlier. FINDINGS: Portable AP upright view at 0653 hours. Extubated. Enteric feeding tube remains in place. Right IJ central line and right PICC line remain in place. Epicardial pacer wires remain in place. Stable cardiomegaly and mediastinal contours. Stable lung volumes. Continued dense retrocardiac opacity, although ventilation at the left lung base appears mildly improved. Additional basilar predominant reticulonodular opacity and/or vascular congestion. No pneumothorax. No areas of worsening ventilation. IMPRESSION: 1. Extubated.  Otherwise stable lines and tubes. 2. Stable lung volumes. Left greater than right lower lobe collapse or consolidation suspected. Stable to mildly increased pulmonary vascularity without overt edema. Electronically Signed   By: Genevie Ann M.D.   On: 04/25/2017 08:43    Assessment/Plan: Diagnosis: Debility Labs independently reviewed.  Records reviewed and summated above.  1. Does the need for close, 24 hr/day medical supervision in concert with the patient's rehab needs make it unreasonable for this patient to be served in a less intensive setting? Yes  2. Co-Morbidities requiring supervision/potential complications: dysphagia (advance diet as tolerated, monitor for aspiration), bacteremia (cont IV Vanc, meropenem, narrow if appropriate), acute on chronic systolic CHF (Monitor in accordance with increased physical activity and avoid UE resistance excercises), CAD s/p CABG (cont meds), HTN (monitor and provide prns in accordance with  increased physical exertion and pain), COPD (monitor RR and O2 sats with increased mobility), chronic neck pain (Biofeedback training with therapies to help reduce reliance on opiate pain medications, monitor pain control during therapies, and sedation at rest and titrate to maximum efficacy to ensure participation and gains in therapies), Fevers (cont to monitor for signs and symptoms of infection, further workup if indicated), tachypnea (monitor RR and O2 Sats with increased physical exertion), leukocytosis (cont to monitor for signs and symptoms of infection, further workup if indicated), ABLA (transfuse if necessary to ensure appropriate perfusion for increased activity tolerance), sepsis (see previous) 3. Due to safety, skin/wound care, disease management and patient education, does the patient require 24 hr/day rehab nursing? Yes 4. Does the patient require coordinated care of a physician, rehab nurse, PT (1-2 hrs/day, 5 days/week), OT (1-2 hrs/day, 5 days/week) and  SLP (1-2 hrs/day, 5 days/week) to address physical and functional deficits in the context of the above medical diagnosis(es)? Yes Addressing deficits in the following areas: balance, endurance, locomotion, strength, transferring, bathing, dressing, feeding, grooming, toileting, cognition, speech, swallowing and psychosocial support 5. Can the patient actively participate in an intensive therapy program of at least 3 hrs of therapy per day at least 5 days per week? In the near future 6. The potential for patient to make measurable gains while on inpatient rehab is excellent 7. Anticipated functional outcomes upon discharge from inpatient rehab are supervision and min assist  with PT, supervision and min assist with OT, modified independent with SLP. 8. Estimated rehab length of stay to reach the above functional goals is: 18-22 days. 9. Anticipated D/C setting: Home 10. Anticipated post D/C treatments: HH therapy and Home excercise  program 11. Overall Rehab/Functional Prognosis: excellent and good  RECOMMENDATIONS: This patient's condition is appropriate for continued rehabilitative care in the following setting: CIR when medically stable and able to tolerate 3 hours of therapy/day. Patient has agreed to participate in recommended program. Yes Note that insurance prior authorization may be required for reimbursement for recommended care.  Comment: Rehab Admissions Coordinator to follow up.  Delice Lesch, MD, ABPMR [place] [place], [place] 04/26/2017

## 2017-04-27 ENCOUNTER — Encounter (HOSPITAL_COMMUNITY): Payer: Self-pay

## 2017-04-27 ENCOUNTER — Other Ambulatory Visit: Payer: Self-pay

## 2017-04-27 ENCOUNTER — Inpatient Hospital Stay (HOSPITAL_COMMUNITY): Payer: Medicare Other

## 2017-04-27 ENCOUNTER — Inpatient Hospital Stay (HOSPITAL_COMMUNITY)
Admission: RE | Admit: 2017-04-27 | Discharge: 2017-05-13 | DRG: 945 | Disposition: A | Discharge status: Home Health | Payer: Medicare Other | Source: Intra-hospital | Attending: Physical Medicine & Rehabilitation | Admitting: Physical Medicine & Rehabilitation

## 2017-04-27 DIAGNOSIS — R5381 Other malaise: Secondary | ICD-10-CM

## 2017-04-27 DIAGNOSIS — R410 Disorientation, unspecified: Secondary | ICD-10-CM

## 2017-04-27 DIAGNOSIS — E876 Hypokalemia: Secondary | ICD-10-CM | POA: Diagnosis present

## 2017-04-27 DIAGNOSIS — Z951 Presence of aortocoronary bypass graft: Secondary | ICD-10-CM

## 2017-04-27 DIAGNOSIS — I2109 ST elevation (STEMI) myocardial infarction involving other coronary artery of anterior wall: Secondary | ICD-10-CM | POA: Diagnosis present

## 2017-04-27 DIAGNOSIS — I253 Aneurysm of heart: Secondary | ICD-10-CM

## 2017-04-27 DIAGNOSIS — I10 Essential (primary) hypertension: Secondary | ICD-10-CM

## 2017-04-27 DIAGNOSIS — I5022 Chronic systolic (congestive) heart failure: Secondary | ICD-10-CM | POA: Diagnosis present

## 2017-04-27 DIAGNOSIS — F419 Anxiety disorder, unspecified: Secondary | ICD-10-CM | POA: Diagnosis present

## 2017-04-27 DIAGNOSIS — R739 Hyperglycemia, unspecified: Secondary | ICD-10-CM | POA: Diagnosis present

## 2017-04-27 DIAGNOSIS — I238 Other current complications following acute myocardial infarction: Secondary | ICD-10-CM

## 2017-04-27 DIAGNOSIS — E872 Acidosis: Secondary | ICD-10-CM | POA: Diagnosis present

## 2017-04-27 DIAGNOSIS — F17210 Nicotine dependence, cigarettes, uncomplicated: Secondary | ICD-10-CM | POA: Diagnosis present

## 2017-04-27 DIAGNOSIS — Z955 Presence of coronary angioplasty implant and graft: Secondary | ICD-10-CM

## 2017-04-27 DIAGNOSIS — I2510 Atherosclerotic heart disease of native coronary artery without angina pectoris: Secondary | ICD-10-CM | POA: Diagnosis present

## 2017-04-27 DIAGNOSIS — Z9861 Coronary angioplasty status: Secondary | ICD-10-CM

## 2017-04-27 DIAGNOSIS — I110 Hypertensive heart disease with heart failure: Secondary | ICD-10-CM | POA: Diagnosis present

## 2017-04-27 DIAGNOSIS — I480 Paroxysmal atrial fibrillation: Secondary | ICD-10-CM | POA: Diagnosis present

## 2017-04-27 DIAGNOSIS — I5021 Acute systolic (congestive) heart failure: Secondary | ICD-10-CM

## 2017-04-27 DIAGNOSIS — D62 Acute posthemorrhagic anemia: Secondary | ICD-10-CM | POA: Diagnosis present

## 2017-04-27 DIAGNOSIS — R1310 Dysphagia, unspecified: Secondary | ICD-10-CM | POA: Diagnosis present

## 2017-04-27 DIAGNOSIS — J449 Chronic obstructive pulmonary disease, unspecified: Secondary | ICD-10-CM | POA: Diagnosis present

## 2017-04-27 DIAGNOSIS — G931 Anoxic brain damage, not elsewhere classified: Secondary | ICD-10-CM

## 2017-04-27 DIAGNOSIS — R21 Rash and other nonspecific skin eruption: Secondary | ICD-10-CM | POA: Diagnosis present

## 2017-04-27 HISTORY — DX: Other malaise: R53.81

## 2017-04-27 LAB — GLUCOSE, CAPILLARY
GLUCOSE-CAPILLARY: 101 mg/dL — AB (ref 65–99)
Glucose-Capillary: 115 mg/dL — ABNORMAL HIGH (ref 65–99)
Glucose-Capillary: 100 mg/dL — ABNORMAL HIGH (ref 65–99)

## 2017-04-27 LAB — URINE CULTURE: Culture: NO GROWTH

## 2017-04-27 LAB — COOXEMETRY PANEL
CARBOXYHEMOGLOBIN: 1.1 % (ref 0.5–1.5)
METHEMOGLOBIN: 1.1 % (ref 0.0–1.5)
O2 SAT: 65.4 %
TOTAL HEMOGLOBIN: 9.3 g/dL — AB (ref 12.0–16.0)

## 2017-04-27 LAB — CBC
HCT: 29.7 % — ABNORMAL LOW (ref 39.0–52.0)
Hemoglobin: 9.4 g/dL — ABNORMAL LOW (ref 13.0–17.0)
MCH: 27.4 pg (ref 26.0–34.0)
MCHC: 31.6 g/dL (ref 30.0–36.0)
MCV: 86.6 fL (ref 78.0–100.0)
PLATELETS: 643 10*3/uL — AB (ref 150–400)
RBC: 3.43 MIL/uL — ABNORMAL LOW (ref 4.22–5.81)
RDW: 14.8 % (ref 11.5–15.5)
WBC: 17.1 10*3/uL — ABNORMAL HIGH (ref 4.0–10.5)

## 2017-04-27 LAB — RENAL FUNCTION PANEL
Albumin: 2.2 g/dL — ABNORMAL LOW (ref 3.5–5.0)
Anion gap: 5 (ref 5–15)
BUN: 29 mg/dL — ABNORMAL HIGH (ref 6–20)
CALCIUM: 8.3 mg/dL — AB (ref 8.9–10.3)
CHLORIDE: 106 mmol/L (ref 101–111)
CO2: 28 mmol/L (ref 22–32)
CREATININE: 0.73 mg/dL (ref 0.61–1.24)
GFR calc Af Amer: >60 mL/min (ref >60)
GFR calc non Af Amer: >60 mL/min (ref >60)
GLUCOSE: 112 mg/dL — AB (ref 65–99)
Phosphorus: 2.8 mg/dL (ref 2.5–4.6)
Potassium: 3.5 mmol/L (ref 3.5–5.1)
SODIUM: 139 mmol/L (ref 135–145)

## 2017-04-27 LAB — VANCOMYCIN, TROUGH: Vancomycin Tr: 22 ug/mL (ref 15–20)

## 2017-04-27 LAB — MAGNESIUM: Magnesium: 2.4 mg/dL (ref 1.7–2.4)

## 2017-04-27 LAB — PROCALCITONIN

## 2017-04-27 MED ORDER — CARVEDILOL 3.125 MG PO TABS: 3.1250 mg | ORAL_TABLET | Freq: Two times a day (BID) | ORAL | Status: DC

## 2017-04-27 MED ORDER — TRAMADOL HCL 50 MG PO TABS: 50.0000 mg | ORAL_TABLET | Freq: Four times a day (QID) | ORAL | 0 refills | Status: DC | PRN

## 2017-04-27 MED ORDER — ACETAMINOPHEN 160 MG/5ML PO SOLN: 650.0000 mg | Freq: Four times a day (QID) | ORAL | 0 refills | Status: DC | PRN

## 2017-04-27 MED ORDER — INSULIN ASPART 100 UNIT/ML ~~LOC~~ SOLN: 0.0000 [IU] | SUBCUTANEOUS | 11 refills | Status: DC

## 2017-04-27 MED ORDER — DIPHENHYDRAMINE HCL 12.5 MG/5ML PO ELIX: 12.5000 mg | ORAL_SOLUTION | Freq: Four times a day (QID) | ORAL | Status: DC | PRN

## 2017-04-27 MED ORDER — SODIUM CHLORIDE 0.9 % IV SOLN
1.0000 g | Freq: Three times a day (TID) | INTRAVENOUS | Status: AC
  Administered 2017-04-28 (×4): 1 g via INTRAVENOUS
  Filled 2017-04-27 (×4): qty 1

## 2017-04-27 MED ORDER — QUETIAPINE FUMARATE 50 MG PO TABS
50.0000 mg | ORAL_TABLET | Freq: Two times a day (BID) | ORAL | Status: DC
  Administered 2017-04-27 – 2017-05-04 (×15): 50 mg
  Filled 2017-04-27 (×16): qty 1

## 2017-04-27 MED ORDER — FREE WATER
200.0000 mL | Freq: Four times a day (QID) | Status: DC
  Administered 2017-04-27 – 2017-04-29 (×6): 200 mL
  Administered 2017-04-29: 60 mL
  Administered 2017-04-29 – 2017-05-07 (×28): 200 mL

## 2017-04-27 MED ORDER — GERHARDT'S BUTT CREAM: 1.0000 "application " | TOPICAL_CREAM | CUTANEOUS | Status: DC | PRN

## 2017-04-27 MED ORDER — ATORVASTATIN CALCIUM 80 MG PO TABS
80.0000 mg | ORAL_TABLET | Freq: Every day | ORAL | Status: DC
  Administered 2017-04-27 – 2017-05-10 (×14): 80 mg
  Filled 2017-04-27 (×15): qty 1

## 2017-04-27 MED ORDER — TICAGRELOR 90 MG PO TABS: 90.0000 mg | ORAL_TABLET | Freq: Two times a day (BID) | ORAL | Status: DC

## 2017-04-27 MED ORDER — GUAIFENESIN 100 MG/5ML PO SOLN: 5.0000 mL | Freq: Four times a day (QID) | ORAL | 0 refills | Status: DC | PRN

## 2017-04-27 MED ORDER — GERHARDT'S BUTT CREAM
TOPICAL_CREAM | CUTANEOUS | Status: DC | PRN
  Administered 2017-04-28 – 2017-05-09 (×2): via TOPICAL
  Filled 2017-04-27: qty 1

## 2017-04-27 MED ORDER — SODIUM CHLORIDE 0.9% FLUSH
10.0000 mL | Freq: Two times a day (BID) | INTRAVENOUS | Status: DC
  Administered 2017-04-28 – 2017-05-02 (×4): 10 mL

## 2017-04-27 MED ORDER — ATORVASTATIN CALCIUM 80 MG PO TABS: 80.0000 mg | ORAL_TABLET | Freq: Every day | ORAL | Status: DC

## 2017-04-27 MED ORDER — TRAZODONE HCL 50 MG PO TABS: 25.0000 mg | ORAL_TABLET | Freq: Every evening | ORAL | Status: DC | PRN

## 2017-04-27 MED ORDER — FLEET ENEMA 7-19 GM/118ML RE ENEM: 1.0000 | ENEMA | Freq: Once | RECTAL | Status: DC | PRN

## 2017-04-27 MED ORDER — ALUM & MAG HYDROXIDE-SIMETH 200-200-20 MG/5ML PO SUSP: 30.0000 mL | ORAL | Status: DC | PRN

## 2017-04-27 MED ORDER — GUAIFENESIN-DM 100-10 MG/5ML PO SYRP: 5.0000 mL | ORAL_SOLUTION | Freq: Four times a day (QID) | ORAL | Status: DC | PRN

## 2017-04-27 MED ORDER — SODIUM CHLORIDE 0.9 % IV SOLN
1200.0000 mg | Freq: Two times a day (BID) | INTRAVENOUS | Status: DC
  Filled 2017-04-27: qty 1200

## 2017-04-27 MED ORDER — RESOURCE THICKENUP CLEAR PO POWD: 1.0000 | ORAL | Status: DC | PRN

## 2017-04-27 MED ORDER — ALBUTEROL SULFATE (2.5 MG/3ML) 0.083% IN NEBU: 2.5000 mg | INHALATION_SOLUTION | RESPIRATORY_TRACT | 12 refills | Status: DC | PRN

## 2017-04-27 MED ORDER — ENOXAPARIN SODIUM 40 MG/0.4ML ~~LOC~~ SOLN
40.0000 mg | SUBCUTANEOUS | Status: DC
  Administered 2017-04-27 – 2017-05-10 (×14): 40 mg via SUBCUTANEOUS
  Filled 2017-04-27 (×14): qty 0.4

## 2017-04-27 MED ORDER — VANCOMYCIN HCL IN DEXTROSE 750-5 MG/150ML-% IV SOLN
750.0000 mg | Freq: Three times a day (TID) | INTRAVENOUS | Status: DC
  Administered 2017-04-27 – 2017-04-30 (×8): 750 mg via INTRAVENOUS
  Filled 2017-04-27 (×11): qty 150

## 2017-04-27 MED ORDER — VITAL 1.5 CAL PO LIQD
1000.0000 mL | ORAL | Status: DC
  Filled 2017-04-27: qty 1000

## 2017-04-27 MED ORDER — CARVEDILOL 3.125 MG PO TABS
3.1250 mg | ORAL_TABLET | Freq: Two times a day (BID) | ORAL | Status: DC
  Administered 2017-04-27: 3.125 mg via ORAL
  Filled 2017-04-27: qty 1

## 2017-04-27 MED ORDER — QUETIAPINE FUMARATE 50 MG PO TABS: 50.0000 mg | ORAL_TABLET | Freq: Two times a day (BID) | ORAL | Status: DC

## 2017-04-27 MED ORDER — TICAGRELOR 90 MG PO TABS
90.0000 mg | ORAL_TABLET | Freq: Two times a day (BID) | ORAL | Status: DC
  Administered 2017-04-27 – 2017-05-11 (×28): 90 mg
  Filled 2017-04-27 (×28): qty 1

## 2017-04-27 MED ORDER — CARVEDILOL 3.125 MG PO TABS
3.1250 mg | ORAL_TABLET | Freq: Two times a day (BID) | ORAL | Status: DC
  Administered 2017-04-27 – 2017-05-07 (×18): 3.125 mg via ORAL
  Filled 2017-04-27 (×19): qty 1

## 2017-04-27 MED ORDER — ACETAMINOPHEN 325 MG PO TABS
325.0000 mg | ORAL_TABLET | ORAL | Status: DC | PRN
  Administered 2017-05-10: 650 mg via ORAL
  Filled 2017-04-27: qty 2

## 2017-04-27 MED ORDER — SODIUM CHLORIDE 0.9 % IV SOLN: 1.0000 g | Freq: Three times a day (TID) | INTRAVENOUS | Status: DC

## 2017-04-27 MED ORDER — VANCOMYCIN HCL 1000 MG IV SOLR
850.0000 mg | Freq: Three times a day (TID) | INTRAVENOUS | Status: DC
  Filled 2017-04-27: qty 1000

## 2017-04-27 MED ORDER — POTASSIUM CHLORIDE 10 MEQ/50ML IV SOLN
10.0000 meq | INTRAVENOUS | Status: AC
  Administered 2017-04-27 (×3): 10 meq via INTRAVENOUS
  Filled 2017-04-27: qty 50

## 2017-04-27 MED ORDER — AMIODARONE HCL 200 MG PO TABS: 200.0000 mg | ORAL_TABLET | Freq: Two times a day (BID) | ORAL | Status: DC

## 2017-04-27 MED ORDER — ORAL CARE MOUTH RINSE
15.0000 mL | Freq: Two times a day (BID) | OROMUCOSAL | Status: DC
  Administered 2017-04-27 – 2017-05-13 (×29): 15 mL via OROMUCOSAL

## 2017-04-27 MED ORDER — AMIODARONE HCL 200 MG PO TABS
200.0000 mg | ORAL_TABLET | Freq: Two times a day (BID) | ORAL | Status: DC
  Administered 2017-04-27 – 2017-05-11 (×28): 200 mg
  Filled 2017-04-27 (×28): qty 1

## 2017-04-27 MED ORDER — CHLORHEXIDINE GLUCONATE CLOTH 2 % EX PADS
6.0000 | MEDICATED_PAD | Freq: Every day | CUTANEOUS | Status: DC
  Administered 2017-04-28 – 2017-05-10 (×12): 6 via TOPICAL

## 2017-04-27 MED ORDER — RESOURCE THICKENUP CLEAR PO POWD
ORAL | Status: DC | PRN
  Filled 2017-04-27 (×3): qty 125

## 2017-04-27 MED ORDER — PROCHLORPERAZINE MALEATE 5 MG PO TABS
5.0000 mg | ORAL_TABLET | Freq: Four times a day (QID) | ORAL | Status: DC | PRN
  Administered 2017-05-09: 10 mg via ORAL
  Filled 2017-04-27: qty 2

## 2017-04-27 MED ORDER — PROCHLORPERAZINE EDISYLATE 5 MG/ML IJ SOLN: 5.0000 mg | Freq: Four times a day (QID) | INTRAMUSCULAR | Status: DC | PRN

## 2017-04-27 MED ORDER — PANTOPRAZOLE SODIUM 40 MG PO PACK
40.0000 mg | PACK | Freq: Every day | ORAL | Status: DC
  Administered 2017-04-28 – 2017-05-09 (×12): 40 mg
  Filled 2017-04-27 (×10): qty 20

## 2017-04-27 MED ORDER — PANTOPRAZOLE SODIUM 40 MG PO PACK: 40.0000 mg | PACK | Freq: Every day | ORAL | Status: DC

## 2017-04-27 MED ORDER — ORAL CARE MOUTH RINSE: 15.0000 mL | Freq: Two times a day (BID) | OROMUCOSAL | 0 refills | Status: DC

## 2017-04-27 MED ORDER — ASPIRIN 81 MG PO CHEW: 81.0000 mg | CHEWABLE_TABLET | Freq: Every day | ORAL | Status: DC

## 2017-04-27 MED ORDER — VITAL 1.5 CAL PO LIQD
1000.0000 mL | ORAL | Status: DC
  Administered 2017-04-27: 1000 mL
  Filled 2017-04-27: qty 1000

## 2017-04-27 MED ORDER — ALBUTEROL SULFATE (2.5 MG/3ML) 0.083% IN NEBU
2.5000 mg | INHALATION_SOLUTION | RESPIRATORY_TRACT | Status: DC | PRN
  Administered 2017-04-30 – 2017-05-01 (×3): 2.5 mg via RESPIRATORY_TRACT
  Filled 2017-04-27 (×3): qty 3

## 2017-04-27 MED ORDER — PRO-STAT SUGAR FREE PO LIQD: 30.0000 mL | Freq: Two times a day (BID) | ORAL | 0 refills | Status: DC

## 2017-04-27 MED ORDER — VANCOMYCIN HCL IN DEXTROSE 1-5 GM/200ML-% IV SOLN: 1000.0000 mg | Freq: Three times a day (TID) | INTRAVENOUS | Status: DC

## 2017-04-27 MED ORDER — POTASSIUM CHLORIDE 20 MEQ/15ML (10%) PO SOLN
20.0000 meq | Freq: Every day | ORAL | Status: DC
  Administered 2017-04-28: 20 meq
  Filled 2017-04-27: qty 15

## 2017-04-27 MED ORDER — PROCHLORPERAZINE 25 MG RE SUPP
12.5000 mg | Freq: Four times a day (QID) | RECTAL | Status: DC | PRN
  Filled 2017-04-27: qty 1

## 2017-04-27 MED ORDER — SODIUM CHLORIDE 0.9% FLUSH
10.0000 mL | INTRAVENOUS | Status: DC | PRN
  Administered 2017-04-28: 20 mL
  Filled 2017-04-27: qty 40

## 2017-04-27 MED ORDER — SENNOSIDES 8.8 MG/5ML PO SYRP
10.0000 mL | ORAL_SOLUTION | Freq: Every day | ORAL | Status: DC
  Administered 2017-04-28 – 2017-05-06 (×8): 10 mL via ORAL
  Filled 2017-04-27 (×10): qty 10

## 2017-04-27 MED ORDER — INSULIN ASPART 100 UNIT/ML ~~LOC~~ SOLN
0.0000 [IU] | SUBCUTANEOUS | Status: DC
  Administered 2017-04-28 – 2017-05-03 (×11): 2 [IU] via SUBCUTANEOUS

## 2017-04-27 MED ORDER — BISACODYL 10 MG RE SUPP
10.0000 mg | Freq: Every day | RECTAL | Status: DC | PRN
  Administered 2017-05-07: 10 mg via RECTAL
  Filled 2017-04-27: qty 1

## 2017-04-27 MED ORDER — GUAIFENESIN 100 MG/5ML PO SOLN: 5.0000 mL | Freq: Four times a day (QID) | ORAL | Status: DC | PRN

## 2017-04-27 MED ORDER — VITAL HIGH PROTEIN PO LIQD: 1000.0000 mL | ORAL | Status: DC

## 2017-04-27 MED ORDER — ASPIRIN 81 MG PO CHEW
81.0000 mg | CHEWABLE_TABLET | Freq: Every day | ORAL | Status: DC
  Administered 2017-04-28 – 2017-05-11 (×14): 81 mg
  Filled 2017-04-27 (×14): qty 1

## 2017-04-27 NOTE — Discharge Summary (Addendum)
Physician Discharge Summary       [address] [place]       [place] [address]             [phone]    Patient ID: [name] MRN: [id number] DOB/AGE: [birth year] 66 y.o.  Admit date: 04/11/2017 Discharge date: 04/27/2017  Admission Diagnoses: 1.  ACS (acute coronary syndrome) (HCC) 2. STEMI (ST elevation myocardial infarction) (HCC) 3. Cardiogenic shock (HCC) 4. Coronary artery disease  Active Diagnoses:  1. Benign essential HTN 2. CHF (congestive heart failure), NYHA class II, acute, combined (HCC) 3.  Acute pulmonary edema (HCC) 4. Acute hypoxemic respiratory failure (HCC) 5. Tachypnea 6. Sepsis (HCC) 7. Dysphagia 8. Chronic neck pain 9.  Acute blood loss anemia 10. COPD (chronic obstructive pulmonary disease) (HCC) 11. ESBL E coli 12. PAF   Consults: Pulmonary/CCM and heart failure  Procedure (s):  Coronary Thrombectomy  Coronary/Graft Acute MI Revascularization  CORONARY STENT INTERVENTION  IABP Insertion  LEFT HEART CATH AND CORONARY ANGIOGRAPHY by Dr. Eldridge Dace on 04/11/2017:  Conclusion     Mid LAD-2 lesion is 100% stenosed.  Balloon angioplasty was performed using a BALLOON SAPPHIRE 2.5X12. Post intervention, there is a 25% residual stenosis with significant no reflow in the apical LAD.  Dist RCA lesion is 95% stenosed. A drug-eluting stent was successfully placed using a STENT SYNERGY DES 3.5X28.  Post intervention, there is a 0% residual stenosis.  LV end diastolic pressure is severely elevated.  There is no aortic valve stenosis.  Ost 1st Diag lesion is 50% stenosed.  Proximal ramus is 70% stenosed.  IABP placed due to high LVEDP.   Continue IV Angiomax for the current bag.  He will need dual antiplatelet therapy for ideally 1 year.  He will need echocardiogram to evaluate left ventricular function.  I suspect his LV function is severely decreased based on his LVEDP of 44 mmHg.  Continue aggressive secondary prevention,  including smoking cessation.    Appreciate assistance from [facility] team for vent management.    IV Lasix ordered.   Edited Result - FINAL                              [facility]*                   *[facility]*                         [address]                        [place], [place] [address]                            [phone]  ------------------------------------------------------------------- Transthoracic Echocardiography  (Report amended )  Patient:    [name], [name] Ronald #:       [id number] Study Date: 04/12/2017 Gender:     M Age:        66 Height:     180.3 cm Weight:     99.8 kg BSA:        2.26 m^2 Pt. Status: Room:   ATTENDING    Everette Rank, MD  ADMITTING    Corky Crafts  PERFORMING   [facility], Inpatient  SONOGRAPHER  Leta Jungling, RDCS  Chapman Fitch, Ameeth  REFERRING    Vedre, Ameeth  cc:  ------------------------------------------------------------------- LV EF: 25% -   30%  ------------------------------------------------------------------- Indications:      Abnormal EKG 794.31.  ------------------------------------------------------------------- History:   PMH:  Cardiogenic Shock.  Coronary artery disease. Chronic obstructive pulmonary disease.  PMH:   Myocardial infarction.  Risk factors:  Current tobacco use.  ------------------------------------------------------------------- Study Conclusions  - Left ventricle: Wall thickness was increased in a pattern of mild   LVH. Systolic function was severely reduced. The estimated   ejection fraction was in the range of 25% to 30%. Dyskinesis of   the mid-apicalanteroseptal and apical myocardium. Features are   consistent with a pseudonormal left ventricular filling pattern,   with concomitant abnormal relaxation and increased filling   pressure (grade 2 diastolic dysfunction). - Pulmonary arteries: Systolic pressure was mildly to moderately    increased. PA peak pressure: 39 mm Hg (S). - Pericardium, extracardiac: A moderate pericardial effusion was   identified.  ------------------------------------------------------------------- Study data:  No prior study was available for comparison.  Study status:  Routine.  Procedure:  The patient reported no pain pre or post test. The patient was unable to respond to pain level query due to mechanical ventilation. The patient would not grade their pain level when asked, but did not appear in distress pre or post test. Transthoracic echocardiography. Image quality was adequate. Study completion:  There were no complications. Transthoracic echocardiography.  M-mode, complete 2D, spectral Doppler, and color Doppler.  Birthdate:  Patient birthdate: 12/07/1950.  Age:  Patient is 66 yr old.  Sex:  Gender: male. BMI: 30.7 kg/m^2.  Blood pressure:     76/55  Patient status: Inpatient.  Study date:  Study date: 04/12/2017. Study time: 09:39 AM.  Location:  ICU/CCU  -------------------------------------------------------------------  ------------------------------------------------------------------- Left ventricle:  There is evidence of a contained rupture of the LV apical septum and apex. There is a moderate sized pericardial effusion. I.  Wall thickness was increased in a pattern of mild LVH. Systolic function was severely reduced. The estimated ejection fraction was in the range of 25% to 30%.  Regional wall motion abnormalities:   Dyskinesis of the mid-apicalanteroseptal and apical myocardium. Features are consistent with a pseudonormal left ventricular filling pattern, with concomitant abnormal relaxation and increased filling pressure (grade 2 diastolic dysfunction).  ------------------------------------------------------------------- Aortic valve:   Mildly thickened leaflets.  Doppler:   There was  no stenosis.  ------------------------------------------------------------------- Aorta:  Aortic root: The aortic root was normal in size. Ascending aorta: The ascending aorta was normal in size.  ------------------------------------------------------------------- Mitral valve:   The valve appears to be grossly normal.    Doppler:  There was no significant regurgitation.  ------------------------------------------------------------------- Left atrium:  The atrium was normal in size.  ------------------------------------------------------------------- Right ventricle:  The cavity size was normal. Systolic function was normal.  ------------------------------------------------------------------- Pulmonic valve:    The valve appears to be grossly normal. Doppler:  There was no significant regurgitation.  ------------------------------------------------------------------- Tricuspid valve:   Structurally normal valve.   Leaflet separation was normal.  Doppler:  Transvalvular velocity was within the normal range. There was mild regurgitation.  ------------------------------------------------------------------- Pulmonary artery:   Systolic pressure was mildly to moderately increased.  ------------------------------------------------------------------- Right atrium:  The atrium was normal in size.  ------------------------------------------------------------------- Pericardium:  A moderate pericardial effusion was identified.  ------------------------------------------------------------------- Systemic veins: Inferior vena cava: The vessel was dilated. The respirophasic diameter changes were blunted (< 50%), consistent with elevated central venous pressure.   3. Cardiopulmonary bypass and resection and plication  of left ventricular aneurysm, placement of Impella 5.0 by Dr. Tyrone SageGerhardt on 04/11/2017. 4. Closure of sternum and wound and repositioning of Impella device under  ultrasound guidance by Dr. Tyrone SageGerhardt on 04/14/2017. 5. Removal of Impella 5.0, stapling of graft, and over sew skin by Dr. Tyrone SageGerhardt on 04/17/2017.  History of Presenting Illness: [name] is 66 year old with a history of MI, HTN, COPD admitted with chest pain. EKG with evidence of inferior/lateral STEMI. Due to lethargy and hypoxemia, he was intubated in the ED.    He was emergently aken to the cath lab as noted above.Cardiac cath revealed an occluded mid LAD.  There was also an ulcerative lesion in the distal RCA.  Given the ECG changes, it was not clear which vessel was the culprit but since the LAD was completely occluded, intervention was attempted on this vessel first.  After thrombectomy and angioplasty, there is significant no reflow.  Intracoronary adenosine was given but the no reflow persisted.  Dr. Eldridge DaceVaranasi then treated the distal RCA successfully with a stent.  Repeat angiography of the LAD showed continued no reflow.  Intra-aortic balloon pump was placed due to his elevated LVEDP of 44 mmHg.  He received 40 mg IV lasix and was started on Neo Synephrine for hypotension. Dr. Gala RomneyBensimhon was consulted to assist with management.   Echo done 04/12/2017 suggested contained rupture of the apex, apical anterior wall, follow-up TEE has been done. Briefly, results showed  normal left ventricular size with mild LV hypertrophy.  EF 20%. There appears to be a contained ventricular rupture with extensive thrombus involving the apex and peri-apical segments that appears to be restrained by the visceral pericardium.  There is a small free-flowing pericardial effusion.  The papillary muscles appear intact, there is trivial mitral regurgitation.  Mildly calcified, trileaflet aortic valve with trivial regurgitation and no stenosis.  Normal right ventricular size and systolic function.  Normal ascending aorta and root size. Normal right and left atrial sizes.  No significant tricuspid regurgitation.  Dr. Tyrone SageGerhardt  was consulted for assistance with surgical management of the patient. All studies were reviewed with cardiology.  Although the patient is not currently tamponaded, with post infarct clot that appears to be impending.  Dr. Tyrone SageGerhardt discussed in detail with the patient's family the dire nature of the current situation.  With the patient on the ventilator, an attempt was made  to explain to him the current situation and he appears to understand.  He underwent resection of LV aneurysm and Impella 5.0 placement on 04/12/2017.  Brief Hospital Course:  He remained critically ill for the several days post op. He remained intubated and sedated;however, he was neurologically intact opens his eyes to commands and follows commands. IABP, which had been placed earlier was removed on 04/13/2017. He was initially AAI paced. He was on Amiodarone drip for V. Fib at end of initial surgery. He was gradually weaned off of Lasix, Epinephrine, and Insulin drips. He was on Cangrleor post op Impella placement (Heparin was later avoided due to concern for HIT), but he was later put on Bivalirudin.  He was put on Nor epinephrine drip. He then underwent intra op TEE, sternal closure, application of wound VAC, and  repositioning of Impella device under ultrasound guidance on 04/14/2017. Cortrak was placed to help with nutrition and tube feedings were started. Impella was repositioned by Dr. Donata ClayVan Trigt on 04/16/2017. Purge alarm went off on Impella. Echo done and Dr. Gala RomneyBensimhon confirmed in a stable position, but questionable  small clot of tip of Impella. Patient had been off Heparin due to possibility of HIT. On 04/17/2017 he underwent removal of Impella, stapling of graft, and over sew of skin. Marland Kitchen. He had leukocytosis and fever. He was found to have ESBL and was on Vancomycin and Meropenem. He was now on Levophed. Co ox were monitored closely. He was still very volume overloaded and he was continued on Lasix drip. Chest tube output gradually  decreased and they were removed. He clinically slowly improved. He was gradually weaned off pressors. Pulmonary/CCM managed weaning of the vent. He was finally extubated on 04/24/2017. He was requiring 3 liters of oxygen via [place]. He will need oxygen at CIR. Hopefully, in time, he can be weaned back to room air. He was changed to oral Amiodarone (via tube) on 03/31/2017. Speech pathology consult was obtained and diet recommendations were followed accordingly. As of 04/27/2017 the following diet is recommended:  SLP Diet Recommendations: Dysphagia 1 (Puree) solids;Pudding thick liquid   Liquid Administration via: Spoon   Medication Administration: Crushed with puree   Supervision: Patient able to self feed;Full supervision/cueing for compensatory strategies   Compensations: Slow rate;Small sips/bites   Postural Changes: Seated upright at 90 degrees   Oral Care Recommendations: Oral care BID   Other Recommendations: Order thickener from pharmacy;Prohibited food (jello, ice cream, thin soups);Remove water pitcher  Patient's wounds are clean and dry and there is no sign of infection. Co ox 11/29 was 65.4, creatinine stable at 0.73, WBC decreased to 17,100, and H and H decreased to 9.4 and 29.7. Patient is very deconditioned and would benefit from rehab. CIR has accepted patient. Per Dr. Tyrone SageGerhardt and Bensimhon, patient is surgically stable for discharge to CIR today. We will ask CIR to please remove chest tube sutures by this weekend (either 12/1 or 12/2).  Latest Vital Signs: Blood pressure (!) 133/57, pulse 90, temperature 98.1 F (36.7 C), temperature source Oral, resp. rate (!) 30, height 5\' 7"  (1.702 m), weight 194 lb 10.7 oz (88.3 kg), SpO2 97 %.  Physical Exam: General appearance: alert and cooperative Neurologic: intact Heart: regular rate and rhythm, S1, S2 normal, no murmur, click, rub or gallop Lungs: diminished breath sounds bibasilar Abdomen: soft, non-tender; bowel sounds  normal; no masses,  no organomegaly Extremities: extremities normal, atraumatic, no cyanosis or edema and Homans sign is negative, no sign of DVT Wound: Sternum stable intact wound is healing well without evidence of infection  Discharge Condition:Stable and discharged to CIR.  Recent laboratory studies:  Lab Results  Component Value Date   WBC 17.1 (H) 04/27/2017   HGB 9.4 (L) 04/27/2017   HCT 29.7 (L) 04/27/2017   MCV 86.6 04/27/2017   PLT 643 (H) 04/27/2017   Lab Results  Component Value Date   NA 139 04/27/2017   K 3.5 04/27/2017   CL 106 04/27/2017   CO2 28 04/27/2017   CREATININE 0.73 04/27/2017   GLUCOSE 112 (H) 04/27/2017    Diagnostic Studies: Dg Chest Port 1 View  Result Date: 04/27/2017 CLINICAL DATA:  Respiratory failure, shortness of Breath EXAM: PORTABLE CHEST 1 VIEW COMPARISON:  04/26/2017 FINDINGS: Cardiomegaly with vascular congestion. Small left pleural effusion, stable. Left lower lobe atelectasis or consolidation, unchanged. No focal opacity on the right. Right PICC line is unchanged. IMPRESSION: Cardiomegaly with vascular congestion. Left lower lobe opacity and small left effusion, stable. Electronically Signed   By: Charlett NoseKevin  Dover M.D.   On: 04/27/2017 08:36   Dg Abd Portable 1v  Result  Date: 04/15/2017 CLINICAL DATA:  Feeding tube placement. EXAM: PORTABLE ABDOMEN - 1 VIEW COMPARISON:  04/14/2017 FINDINGS: Feeding tube is been placed which extends through the stomach and duodenum with the tip located beyond the ligament of Treitz in the proximal jejunum. Nasogastric tube remains which is coiled in the stomach. IMPRESSION: The feeding tube extends through the stomach and duodenum with the tip located in the proximal jejunum. Electronically Signed   By: Irish Lack M.D.   On: 04/15/2017 13:51    Discharge Medications: Allergies as of 04/27/2017   No Known Allergies     Medication List    TAKE these medications   acetaminophen 160 MG/5ML  solution Commonly known as:  TYLENOL Place 20.3 mLs (650 mg total) into feeding tube every 6 (six) hours as needed for mild pain, moderate pain or fever (Temp > 101.0 F.).   albuterol (2.5 MG/3ML) 0.083% nebulizer solution Commonly known as:  PROVENTIL Take 3 mLs (2.5 mg total) by nebulization every 3 (three) hours as needed for wheezing or shortness of breath.   amiodarone 200 MG tablet Commonly known as:  PACERONE Place 1 tablet (200 mg total) into feeding tube 2 (two) times daily.   aspirin 81 MG chewable tablet Place 1 tablet (81 mg total) into feeding tube daily. Start taking on:  04/28/2017   atorvastatin 80 MG tablet Commonly known as:  LIPITOR Place 1 tablet (80 mg total) into feeding tube daily at 6 PM.   carvedilol 3.125 MG tablet Commonly known as:  COREG Take 1 tablet (3.125 mg total) by mouth 2 (two) times daily with a meal.   feeding supplement (PRO-STAT SUGAR FREE 64) Liqd Place 30 mLs into feeding tube 2 (two) times daily.   feeding supplement (VITAL HIGH PROTEIN) Liqd liquid Place 1,000 mLs into feeding tube continuous.   Gerhardt's butt cream Crea Apply 1 application topically as needed for irritation.   guaiFENesin 100 MG/5ML Soln Commonly known as:  ROBITUSSIN Place 5 mLs (100 mg total) into feeding tube every 6 (six) hours as needed for cough or to loosen phlegm.   insulin aspart 100 UNIT/ML injection Commonly known as:  novoLOG Inject 0-24 Units into the skin every 4 (four) hours.   meropenem 1 g in sodium chloride 0.9 % 100 mL Inject 1 g into the vein every 8 (eight) hours.   mouth rinse Liqd solution 15 mLs by Mouth Rinse route 2 (two) times daily.   pantoprazole sodium 40 mg/20 mL Pack Commonly known as:  PROTONIX Place 20 mLs (40 mg total) into feeding tube daily. Start taking on:  04/28/2017   QUEtiapine 50 MG tablet Commonly known as:  SEROQUEL Place 1 tablet (50 mg total) into feeding tube 2 (two) times daily.   RESOURCE THICKENUP  CLEAR Powd Place 120 g into feeding tube as needed.   ticagrelor 90 MG Tabs tablet Commonly known as:  BRILINTA Place 1 tablet (90 mg total) into feeding tube 2 (two) times daily.   traMADol 50 MG tablet Commonly known as:  ULTRAM Take 1 tablet (50 mg total) by mouth every 6 (six) hours as needed for moderate pain.   vancomycin 1-5 GM/200ML-% Soln Commonly known as:  VANCOCIN Inject 200 mLs (1,000 mg total) into the vein every 8 (eight) hours.      The patient has been discharged on:   1.Beta Blocker:  Yes [x   ]  No   [   ]                              If No, reason:  2.Ace Inhibitor/ARB: Yes [   ]                                     No  [  x  ]                                     If No, reason:Labile BP, may be able to start after discharge  3.Statin:   Yes [x   ]                  No  [   ]                  If No, reason:  4.Ecasa:  Yes  [ x  ]                  No   [   ]                  If No, reason: Follow Up Appointments: Follow-up Information    Bensimhon, Bevelyn Buckles, MD. Call.   Specialty:  Cardiology Why:  for a follow up appointment for 2 weeks Contact information: [address] [place] [address] [phone]        Delight Ovens, MD. Go on 06/22/2017.   Specialty:  Cardiothoracic Surgery Why:  PA/LAT CXR to be taken (at [facility] which is in the same building as Dr. Dennie Maizes office) on 06/22/2017 at 10:30 am;Appointment time is at 11:00 am Contact information: [address] [place] [address] [phone]           Signed: Lelon Huh [facility] 04/27/2017, 1:14 PM

## 2017-04-27 NOTE — Progress Notes (Addendum)
Rehab admissions - I met with patient this am and tried to talk with him.  He seems confused and did not answer my questions to my satisfaction.  I tried to call his sister, but the number said wrong number.  I spoke with social worker who plans to speak with patient and try to get contact numbers.  I need to talk to some family member to discuss rehab plans and to determine potential caregiver support after inpatient rehab stay.  Call me for questions.  #[phone]  Update:  I was able to contact his sister and confirm caregiver support.  Bed available on inpatient rehab and will admit to CIR today.  Call me for questions.  #[phone]

## 2017-04-27 NOTE — Consult Note (Signed)
Physical Medicine and Rehabilitation Consult   Reason for Consult: Debility due to  STEMI/Cardiogenic shock/CABG/ infection.  Referring Physician: Dr. Haroldine Laws.    HPI: [name] is a 66 y.o. male with history of CAD, HTN, COPD, chronic neck pain;  who was admitted on 04/11/17 with SOB and malaise due to inferior lateral STEMI, lethargy and cardiogenic shock requiring intubation in ED. History taken from chart review and sister.  He underwent cardiac cath with RCA DES and balloon angioplasty of mid LAD. He required IABP as well as neo due to hypotension. Echo showed diffuse hypokinesis with moderate pericardial effusion and EF 25-30%.  Dr. Servando Snare consulted due to concerns of LV rupture and TEE showed contained rupture with extensive thrombus. He was taken to OR for CABG with resection of LV aneurysm with placement of impella 11/14 and underwent chest closure on 11/17.  Hospital course significant for E coli in 1/2 blood cultures and wound--antibiotic coverage broadened to meropenum but with upward trend in temps today. He has been weaned of Neo and treated for acute on chronic systolic CHF.  He tolerated extubation on 11/26 and remains NPO due signs of weak cough and signs of dysphagia--MBS schedule for today. Therapy evaluations done yesterday revealing debility and CIR recommended due to functional deficits.     Review of Systems  Constitutional: Positive for malaise/fatigue. Negative for chills and fever.  HENT: Negative for hearing loss and tinnitus.   Eyes: Negative for blurred vision and double vision.  Respiratory: Positive for shortness of breath. Negative for wheezing.   Cardiovascular: Positive for chest pain and leg swelling.  Gastrointestinal: Negative for heartburn and nausea.  Genitourinary:       Foley pulled today.   Musculoskeletal: Positive for myalgias and neck pain.  Neurological: Negative for focal weakness and headaches.  Psychiatric/Behavioral: The patient  is not nervous/anxious.   All other systems reviewed and are negative.   Past Medical History:  Diagnosis Date  . Coronary artery disease   . Hypertension     Past Surgical History:  Procedure Laterality Date  . APPLICATION OF WOUND VAC N/A 04/14/2017   Procedure: APPLICATION OF WOUND VAC with reposition of Impella device;  Surgeon: Grace Isaac, MD;  Location: [place];  Service: Thoracic;  Laterality: N/A;  . CORONARY STENT INTERVENTION N/A 04/11/2017   Procedure: CORONARY STENT INTERVENTION;  Surgeon: Jettie Booze, MD;  Location: [facility];  Service: Cardiovascular;  Laterality: N/A;  . CORONARY THROMBECTOMY N/A 04/11/2017   Procedure: Coronary Thrombectomy;  Surgeon: Jettie Booze, MD;  Location: [facility];  Service: Cardiovascular;  Laterality: N/A;  . CORONARY/GRAFT ACUTE MI REVASCULARIZATION N/A 04/11/2017   Procedure: Coronary/Graft Acute MI Revascularization;  Surgeon: Jettie Booze, MD;  Location: [place];  Service: Cardiovascular;  Laterality: N/A;  . IABP INSERTION N/A 04/11/2017   Procedure: IABP Insertion;  Surgeon: Jettie Booze, MD;  Location: [facility];  Service: Cardiovascular;  Laterality: N/A;  . LEFT HEART CATH AND CORONARY ANGIOGRAPHY N/A 04/11/2017   Procedure: LEFT HEART CATH AND CORONARY ANGIOGRAPHY;  Surgeon: Jettie Booze, MD;  Location: [place] [facility];  Service: Cardiovascular;  Laterality: N/A;  . PLACEMENT OF IMPELLA LEFT VENTRICULAR ASSIST DEVICE  04/12/2017   Procedure: PLACEMENT OF IMPELLA  LD;  Surgeon: Grace Isaac, MD;  Location: [facility];  Service: Open Heart Surgery;;  . REMOVAL OF IMPELLA LEFT VENTRICULAR ASSIST DEVICE N/A 04/17/2017  Procedure: REMOVAL OF IMPELLA LEFT VENTRICULAR ASSIST DEVICE;  Surgeon: Grace Isaac, MD;  Location: [place] [facility];  Service: Open Heart Surgery;  Laterality: N/A;  . REPAIR OF RIGHT VENTRICLE LACERATION Left 04/12/2017   Procedure:  Resection of LV Aneurysm;  Surgeon: Grace Isaac, MD;  Location: [facility];  Service: Open Heart Surgery;  Laterality: Left;  . STERNAL CLOSURE N/A 04/14/2017   Procedure: STERNAL CLOSURE;  Surgeon: Grace Isaac, MD;  Location: [facility];  Service: Thoracic;  Laterality: N/A;  . TEE WITHOUT CARDIOVERSION N/A 04/14/2017   Procedure: TRANSESOPHAGEAL ECHOCARDIOGRAM (TEE);  Surgeon: Grace Isaac, MD;  Location: [facility];  Service: Thoracic;  Laterality: N/A;  . ULTRASOUND GUIDANCE FOR VASCULAR ACCESS  04/11/2017   Procedure: Ultrasound Guidance For Vascular Access;  Surgeon: Jettie Booze, MD;  Location: [place] [facility];  Service: Cardiovascular;;    No family history on file.    Social History:  Married. Retired. He reports that he has been smoking 2 PPD.  he has never used smokeless tobacco. He does not use alcohol or illiict drugs.     Allergies: No Known Allergies    No medications prior to admission.    Home:    Functional History:   Functional Status:  Mobility:          ADL:    Cognition:      There were no vitals taken for this visit. Physical Exam  Nursing note and vitals reviewed. Constitutional: He appears well-developed and well-nourished. No distress.  Cortak in place.   HENT:  Head: Normocephalic and atraumatic.  Mouth/Throat: Oropharynx is clear and moist.  +NG Poor dentition  Eyes: Conjunctivae and EOM are normal. Pupils are equal, round, and reactive to light.  Neck: Normal range of motion. Neck supple.  Cardiovascular: An irregularly irregular rhythm present.  Respiratory: Effort normal. No stridor. No respiratory distress. He has decreased breath sounds.  Weak cough +[place]  GI: Soft. Bowel sounds are normal. He exhibits no distension. There is no tenderness.  Musculoskeletal: He exhibits edema and tenderness.  Neurological: He is alert.  A&Ox2 Dysphonia noted.  Easily distracted and slow to process.  Able to follow simple motor  commands.  Motor: B/l UE 4-/5 proximal to distal B/l LE: HF, KE 3+/5, ADF/PF 4/5  Skin: Skin is warm and dry. He is not diaphoretic.  Psychiatric: His affect is blunt. His speech is delayed. He is slowed.    Results for orders placed or performed during the hospital encounter of 04/11/17 (from the past 24 hour(s))  Urinalysis, Routine w reflex microscopic     Status: Abnormal   Collection Time: 04/26/17  3:15 PM  Result Value Ref Range   Color, Urine YELLOW YELLOW   APPearance HAZY (A) CLEAR   Specific Gravity, Urine 1.024 1.005 - 1.030   pH 6.0 5.0 - 8.0   Glucose, UA NEGATIVE NEGATIVE mg/dL   Hgb urine dipstick SMALL (A) NEGATIVE   Bilirubin Urine NEGATIVE NEGATIVE   Ketones, ur NEGATIVE NEGATIVE mg/dL   Protein, ur 30 (A) NEGATIVE mg/dL   Nitrite NEGATIVE NEGATIVE   Leukocytes, UA NEGATIVE NEGATIVE   RBC / HPF 6-30 0 - 5 RBC/hpf   WBC, UA 6-30 0 - 5 WBC/hpf   Bacteria, UA RARE (A) NONE SEEN   Squamous Epithelial / LPF NONE SEEN NONE SEEN   Mucus PRESENT   Glucose, capillary     Status: Abnormal   Collection Time: 04/26/17  4:18 PM  Result  Value Ref Range   Glucose-Capillary 120 (H) 65 - 99 mg/dL   Comment 1 Capillary Specimen   Glucose, capillary     Status: Abnormal   Collection Time: 04/26/17  8:13 PM  Result Value Ref Range   Glucose-Capillary 104 (H) 65 - 99 mg/dL  Glucose, capillary     Status: None   Collection Time: 04/26/17 11:31 PM  Result Value Ref Range   Glucose-Capillary 98 65 - 99 mg/dL   Comment 1 Capillary Specimen   Glucose, capillary     Status: Abnormal   Collection Time: 04/27/17  4:04 AM  Result Value Ref Range   Glucose-Capillary 115 (H) 65 - 99 mg/dL   Comment 1 Capillary Specimen   Procalcitonin     Status: None   Collection Time: 04/27/17  4:11 AM  Result Value Ref Range   Procalcitonin <0.10 ng/mL  Magnesium     Status: None   Collection Time: 04/27/17  4:11 AM  Result Value Ref Range   Magnesium 2.4 1.7 - 2.4 mg/dL  CBC     Status:  Abnormal   Collection Time: 04/27/17  4:11 AM  Result Value Ref Range   WBC 17.1 (H) 4.0 - 10.5 K/uL   RBC 3.43 (L) 4.22 - 5.81 MIL/uL   Hemoglobin 9.4 (L) 13.0 - 17.0 g/dL   HCT 29.7 (L) 39.0 - 52.0 %   MCV 86.6 78.0 - 100.0 fL   MCH 27.4 26.0 - 34.0 pg   MCHC 31.6 30.0 - 36.0 g/dL   RDW 14.8 11.5 - 15.5 %   Platelets 643 (H) 150 - 400 K/uL  Renal function panel     Status: Abnormal   Collection Time: 04/27/17  4:11 AM  Result Value Ref Range   Sodium 139 135 - 145 mmol/L   Potassium 3.5 3.5 - 5.1 mmol/L   Chloride 106 101 - 111 mmol/L   CO2 28 22 - 32 mmol/L   Glucose, Bld 112 (H) 65 - 99 mg/dL   BUN 29 (H) 6 - 20 mg/dL   Creatinine, Ser 0.73 0.61 - 1.24 mg/dL   Calcium 8.3 (L) 8.9 - 10.3 mg/dL   Phosphorus 2.8 2.5 - 4.6 mg/dL   Albumin 2.2 (L) 3.5 - 5.0 g/dL   GFR calc non Af Amer >60 >60 mL/min   GFR calc Af Amer >60 >60 mL/min   Anion gap 5 5 - 15  .Cooxemetry Panel (carboxy, met, total hgb, O2 sat)     Status: Abnormal   Collection Time: 04/27/17  4:20 AM  Result Value Ref Range   Total hemoglobin 9.3 (L) 12.0 - 16.0 g/dL   O2 Saturation 65.4 %   Carboxyhemoglobin 1.1 0.5 - 1.5 %   Methemoglobin 1.1 0.0 - 1.5 %  Glucose, capillary     Status: Abnormal   Collection Time: 04/27/17  8:20 AM  Result Value Ref Range   Glucose-Capillary 101 (H) 65 - 99 mg/dL   Comment 1 Notify RN   Glucose, capillary     Status: Abnormal   Collection Time: 04/27/17 12:23 PM  Result Value Ref Range   Glucose-Capillary 100 (H) 65 - 99 mg/dL   Comment 1 Arterial Specimen   Vancomycin, trough     Status: Abnormal   Collection Time: 04/27/17 12:24 PM  Result Value Ref Range   Vancomycin Tr 22 (HH) 15 - 20 ug/mL   Dg Chest Port 1 View  Result Date: 04/27/2017 CLINICAL DATA:  Respiratory failure, shortness of Breath EXAM:  PORTABLE CHEST 1 VIEW COMPARISON:  04/26/2017 FINDINGS: Cardiomegaly with vascular congestion. Small left pleural effusion, stable. Left lower lobe atelectasis or  consolidation, unchanged. No focal opacity on the right. Right PICC line is unchanged. IMPRESSION: Cardiomegaly with vascular congestion. Left lower lobe opacity and small left effusion, stable. Electronically Signed   By: Rolm Baptise M.D.   On: 04/27/2017 08:36   Dg Chest Port 1 View  Result Date: 04/26/2017 CLINICAL DATA:  Pleural effusion EXAM: PORTABLE CHEST 1 VIEW COMPARISON:  04/25/2017 FINDINGS: Right arm PICC tip in the SVC in good position. Right jugular central venous catheter tip removed. Feeding tube enters the stomach with the tip not visualized Cardiac enlargement with median sternotomy wires. Negative for heart failure Improved aeration in the lung bases. Right lung base clear. Improvement in left lower lobe atelectasis/ infiltrate. Small left effusion. IMPRESSION: Improved aeration in the lung bases with persistent left lower lobe airspace disease and small left effusion. Negative for heart failure. Electronically Signed   By: Franchot Gallo M.D.   On: 04/26/2017 09:04    Assessment/Plan: Diagnosis: Debility Labs independently reviewed.  Records reviewed and summated above.  1. Does the need for close, 24 hr/day medical supervision in concert with the patient's rehab needs make it unreasonable for this patient to be served in a less intensive setting? Yes  2. Co-Morbidities requiring supervision/potential complications: dysphagia (advance diet as tolerated, monitor for aspiration), bacteremia (cont IV Vanc, meropenem, narrow if appropriate), acute on chronic systolic CHF (Monitor in accordance with increased physical activity and avoid UE resistance excercises), CAD s/p CABG (cont meds), HTN (monitor and provide prns in accordance with increased physical exertion and pain), COPD (monitor RR and O2 sats with increased mobility), chronic neck pain (Biofeedback training with therapies to help reduce reliance on opiate pain medications, monitor pain control during therapies, and sedation at  rest and titrate to maximum efficacy to ensure participation and gains in therapies), Fevers (cont to monitor for signs and symptoms of infection, further workup if indicated), tachypnea (monitor RR and O2 Sats with increased physical exertion), leukocytosis (cont to monitor for signs and symptoms of infection, further workup if indicated), ABLA (transfuse if necessary to ensure appropriate perfusion for increased activity tolerance), sepsis (see previous) 3. Due to safety, skin/wound care, disease management and patient education, does the patient require 24 hr/day rehab nursing? Yes 4. Does the patient require coordinated care of a physician, rehab nurse, PT (1-2 hrs/day, 5 days/week), OT (1-2 hrs/day, 5 days/week) and SLP (1-2 hrs/day, 5 days/week) to address physical and functional deficits in the context of the above medical diagnosis(es)? Yes Addressing deficits in the following areas: balance, endurance, locomotion, strength, transferring, bathing, dressing, feeding, grooming, toileting, cognition, speech, swallowing and psychosocial support 5. Can the patient actively participate in an intensive therapy program of at least 3 hrs of therapy per day at least 5 days per week? In the near future 6. The potential for patient to make measurable gains while on inpatient rehab is excellent 7. Anticipated functional outcomes upon discharge from inpatient rehab are supervision and min assist  with PT, supervision and min assist with OT, modified independent with SLP. 8. Estimated rehab length of stay to reach the above functional goals is: 18-22 days. 9. Anticipated D/C setting: Home 10. Anticipated post D/C treatments: HH therapy and Home excercise program 11. Overall Rehab/Functional Prognosis: excellent and good  RECOMMENDATIONS: This patient's condition is appropriate for continued rehabilitative care in the following setting: CIR when medically stable  and able to tolerate 3 hours of  therapy/day. Patient has agreed to participate in recommended program. Yes Note that insurance prior authorization may be required for reimbursement for recommended care.  Comment: Rehab Admissions Coordinator to follow up.  Delice Lesch, MD, ABPMR Retta Diones, RN 04/27/2017

## 2017-04-27 NOTE — Progress Notes (Signed)
Patient ID: [name], male   DOB: 09-Oct-1950, 66 y.o.   MRN: [id number] [facility] DAILY ICU PROGRESS NOTE                   [address]            [place] [id number]          [phone]   10 Days Post-Op Procedure(s) (LRB): REMOVAL OF IMPELLA LEFT VENTRICULAR ASSIST DEVICE (N/A)  Total Length of Stay:  LOS: 16 days   Subjective: Up to chair still very weak as far as ambulating, remains on tube feedings  Objective: Vital signs in last 24 hours: Temp:  [98.2 F (36.8 C)-100.4 F (38 C)] 98.3 F (36.8 C) (11/29 0825) Pulse Rate:  [38-103] 85 (11/29 0700) Cardiac Rhythm: Normal sinus rhythm (11/29 0700) Resp:  [25-41] 37 (11/29 0700) BP: (104-142)/(47-90) 134/60 (11/29 0700) SpO2:  [97 %-100 %] 98 % (11/29 0700)  Filed Weights   04/24/17 0400 04/25/17 0449 04/26/17 0500  Weight: 194 lb 10.7 oz (88.3 kg) 186 lb 11.7 oz (84.7 kg) 194 lb 3.6 oz (88.1 kg)    Weight change:    Hemodynamic parameters for last 24 hours: CVP:  [6 mmHg] 6 mmHg  Intake/Output from previous day: 11/28 0701 - 11/29 0700 In: 1793 [P.O.:3; I.V.:10; NG/GT:1280; IV Piggyback:500] Out: 680 [Urine:680]  Intake/Output this shift: No intake/output data recorded.  Current Meds: Scheduled Meds: . amiodarone  200 mg Per Tube BID  . aspirin  81 mg Per Tube Daily  . atorvastatin  80 mg Per Tube q1800  . bisacodyl  10 mg Oral Daily   Or  . bisacodyl  10 mg Rectal Daily  . Chlorhexidine Gluconate Cloth  6 each Topical Daily  . enoxaparin (LOVENOX) injection  40 mg Subcutaneous Q24H  . feeding supplement (PRO-STAT SUGAR FREE 64)  30 mL Per Tube BID  . insulin aspart  0-24 Units Subcutaneous Q4H  . mouth rinse  15 mL Mouth Rinse BID  . pantoprazole sodium  40 mg Per Tube Daily  . potassium chloride  20 mEq Per Tube Daily  . QUEtiapine  50 mg Per Tube BID  . sodium chloride flush  10-40 mL Intracatheter Q12H  . ticagrelor  90 mg Per Tube BID   Continuous Infusions: . sodium chloride     . sodium chloride 10 mL/hr at 04/26/17 0800  . feeding supplement (VITAL HIGH PROTEIN) 1,000 mL (04/27/17 0207)  . lactated ringers Stopped (04/23/17 1900)  . meropenem (MERREM) IV 1 g (04/27/17 0626)  . potassium chloride 10 mEq (04/27/17 0801)  . vancomycin Stopped (04/27/17 0515)   PRN Meds:.acetaminophen (TYLENOL) oral liquid 160 mg/5 mL, albuterol, fentaNYL (SUBLIMAZE) injection, Zoa Dowty's butt cream, guaiFENesin, ondansetron (ZOFRAN) IV, RESOURCE THICKENUP CLEAR, sodium chloride flush  General appearance: alert and cooperative Neurologic: intact Heart: regular rate and rhythm, S1, S2 normal, no murmur, click, rub or gallop Lungs: diminished breath sounds bibasilar Abdomen: soft, non-tender; bowel sounds normal; no masses,  no organomegaly Extremities: extremities normal, atraumatic, no cyanosis or edema and Homans sign is negative, no sign of DVT Wound: Sternum stable intact wound is healing well without evidence of infection  Lab Results: CBC: Recent Labs    04/26/17 0945 04/27/17 0411  WBC 22.1* 17.1*  HGB 10.9* 9.4*  HCT 34.6* 29.7*  PLT 726* 643*   BMET:  Recent Labs    04/26/17 0945 04/27/17 0411  NA 135 139  K 4.0 3.5  CL  104 106  CO2 26 28  GLUCOSE 131* 112*  BUN 29* 29*  CREATININE 0.74 0.73  CALCIUM 8.7* 8.3*    CMET: Lab Results  Component Value Date   WBC 17.1 (H) 04/27/2017   HGB 9.4 (L) 04/27/2017   HCT 29.7 (L) 04/27/2017   PLT 643 (H) 04/27/2017   GLUCOSE 112 (H) 04/27/2017   CHOL 167 04/11/2017   TRIG 109 04/11/2017   HDL 27 (L) 04/11/2017   LDLCALC 118 (H) 04/11/2017   ALT 22 04/23/2017   AST 19 04/23/2017   NA 139 04/27/2017   K 3.5 04/27/2017   CL 106 04/27/2017   CREATININE 0.73 04/27/2017   BUN 29 (H) 04/27/2017   CO2 28 04/27/2017   INR 1.27 04/18/2017      PT/INR: No results for input(s): LABPROT, INR in the last 72 hours. Radiology: Dg Chest Port 1 View  Result Date: 04/27/2017 CLINICAL DATA:  Respiratory failure,  shortness of Breath EXAM: PORTABLE CHEST 1 VIEW COMPARISON:  04/26/2017 FINDINGS: Cardiomegaly with vascular congestion. Small left pleural effusion, stable. Left lower lobe atelectasis or consolidation, unchanged. No focal opacity on the right. Right PICC line is unchanged. IMPRESSION: Cardiomegaly with vascular congestion. Left lower lobe opacity and small left effusion, stable. Electronically Signed   By: Charlett NoseKevin  Dover M.D.   On: 04/27/2017 08:36     Assessment/Plan: S/P Procedure(s) (LRB): REMOVAL OF IMPELLA LEFT VENTRICULAR ASSIST DEVICE (N/A) White count decreased some Drop in hemoglobin from yesterday but without obvious blood loss, continue to monitor Continued chronic ICU care per heart failure team and CCM    Delight Ovensdward B [place] Fanfan 04/27/2017 8:40 AM

## 2017-04-27 NOTE — H&P (Signed)
Physical Medicine and Rehabilitation Admission H&P        Chief Complaint  Patient presents with  . Debility due to  STEMI/Cardiogenic shock/CABG/ infection        HPI:  [name] is a 66 y.o. male with history of CAD, HTN, COPD, chronic neck pain;  who was admitted on 04/11/17 with SOB and malaise due to inferior lateral STEMI, lethargy and cardiogenic shock requiring intubation in ED. History taken from chart review and sister.  He underwent cardiac cath with RCA DES and balloon angioplasty of mid LAD. He required IABP as well as neo due to hypotension. Echo showed diffuse hypokinesis with moderate pericardial effusion and EF 25-30%.  Dr. Servando Snare consulted due to concerns of LV rupture and TEE showed contained rupture with extensive thrombus. He was taken to OR for CABG with resection of LV aneurysm with placement of impella 11/14 and underwent chest closure on 11/17.  Hospital course significant for E coli in 1/2 blood cultures and wound--antibiotic coverage broadened to meropenum but with upward trend in temps therefore Vancomycin added on 11/27.    He has been weaned of Neo and treated for acute on chronic systolic CHF.  He tolerated extubation on 11/26 and MBS done 11/28 to evaluate swallow. He was started on pudding/puree diet and foley d/c yesterday.  Therapy evaluations done yesterday revealing debility and CIR recommended due to functional deficits.       Review of Systems  Constitutional: Negative for chills and fever.  HENT: Negative for hearing loss and tinnitus.   Eyes: Negative for blurred vision and double vision.  Respiratory: Positive for cough and shortness of breath.   Cardiovascular: Positive for chest pain.  Gastrointestinal: Negative for constipation, heartburn and nausea.  Genitourinary: Negative for frequency and urgency.  Musculoskeletal: Positive for myalgias.  Neurological: Positive for speech change and weakness. Negative for dizziness, focal weakness and  headaches.  Psychiatric/Behavioral: Positive for memory loss. Negative for depression. The patient is not nervous/anxious.             Past Medical History:  Diagnosis Date  . Coronary artery disease    . Hypertension             Past Surgical History:  Procedure Laterality Date  . APPLICATION OF WOUND VAC N/A 04/14/2017    Procedure: APPLICATION OF WOUND VAC with reposition of Impella device;  Surgeon: Grace Isaac, MD;  Location: [facility];  Service: Thoracic;  Laterality: N/A;  . CORONARY STENT INTERVENTION N/A 04/11/2017    Procedure: CORONARY STENT INTERVENTION;  Surgeon: Jettie Booze, MD;  Location: [facility];  Service: Cardiovascular;  Laterality: N/A;  . CORONARY THROMBECTOMY N/A 04/11/2017    Procedure: Coronary Thrombectomy;  Surgeon: Jettie Booze, MD;  Location: [facility];  Service: Cardiovascular;  Laterality: N/A;  . CORONARY/GRAFT ACUTE MI REVASCULARIZATION N/A 04/11/2017    Procedure: Coronary/Graft Acute MI Revascularization;  Surgeon: Jettie Booze, MD;  Location: [facility];  Service: Cardiovascular;  Laterality: N/A;  . IABP INSERTION N/A 04/11/2017    Procedure: IABP Insertion;  Surgeon: Jettie Booze, MD;  Location: [facility];  Service: Cardiovascular;  Laterality: N/A;  . LEFT HEART CATH AND CORONARY ANGIOGRAPHY N/A 04/11/2017    Procedure: LEFT HEART CATH AND CORONARY ANGIOGRAPHY;  Surgeon: Jettie Booze, MD;  Location: [facility];  Service: Cardiovascular;  Laterality: N/A;  . PLACEMENT OF IMPELLA LEFT VENTRICULAR ASSIST DEVICE  04/12/2017    Procedure: PLACEMENT OF IMPELLA  LD;  Surgeon: Grace Isaac, MD;  Location: [facility];  Service: Open Heart Surgery;;  . REMOVAL OF IMPELLA LEFT VENTRICULAR ASSIST DEVICE N/A 04/17/2017    Procedure: REMOVAL OF IMPELLA LEFT VENTRICULAR ASSIST DEVICE;  Surgeon: Grace Isaac, MD;  Location: [facility];  Service: Open Heart Surgery;   Laterality: N/A;  . REPAIR OF RIGHT VENTRICLE LACERATION Left 04/12/2017    Procedure: Resection of LV Aneurysm;  Surgeon: Grace Isaac, MD;  Location: [facility];  Service: Open Heart Surgery;  Laterality: Left;  . STERNAL CLOSURE N/A 04/14/2017    Procedure: STERNAL CLOSURE;  Surgeon: Grace Isaac, MD;  Location: [facility];  Service: Thoracic;  Laterality: N/A;  . TEE WITHOUT CARDIOVERSION N/A 04/14/2017    Procedure: TRANSESOPHAGEAL ECHOCARDIOGRAM (TEE);  Surgeon: Grace Isaac, MD;  Location: [facility];  Service: Thoracic;  Laterality: N/A;  . ULTRASOUND GUIDANCE FOR VASCULAR ACCESS   04/11/2017    Procedure: Ultrasound Guidance For Vascular Access;  Surgeon: Jettie Booze, MD;  Location: [facility];  Service: Cardiovascular;;      History reviewed. No pertinent family history.      Social History:  [name] speaks limited [place] ([place] . He's retired--disabled. He reports that he has been smoking 2 PPD.  he has never used smokeless tobacco. He does not use alcohol or illiict drugs.     allergies: No Known Allergies      No medications prior to admission.      Drug Regimen Review  Drug regimen was reviewed and remains appropriate with no significant issues identified   Home: Home Living Family/patient expects to be discharged to:: Private residence Living Arrangements: Spouse/significant other(Lives with [place] significant other male) Available Help at Discharge: Family, Available 24 hours/day(Can go home with his sister.) Type of Home: Apartment Home Access: Stairs to enter [employer] of Steps: Patient's apartment with 6-8 steps back and front entry. Home Layout: One level Additional Comments: Pt with increased distraction throughout session; will need to get further home set-up, support, and PLOF details during next treatment session  Lives With: Significant other   Functional History: Prior Function Comments: Pt with  increased distraction throughout session; will need to get further home set-up, support, and PLOF details during next treatment session   Functional Status:  Mobility: Bed Mobility Overal bed mobility: Needs Assistance Bed Mobility: Sit to Supine Sit to supine: Min assist General bed mobility comments: Pt sitting in recliner on arrival but requires assistance to elevate trunk from back of chair to prepare for transfer.  Pt reaching for arms rests to pull and instructed to avoid pulling.  Constant redirection to hug heart pillow.   Transfers Overall transfer level: Needs assistance Equipment used: 4-wheeled walker(EVA walker) Transfers: Sit to/from Stand Sit to Stand: Mod assist, +2 safety/equipment General transfer comment: Pt remains to require education to adhere to sternal precautions during sit to stand transfers.  Pt required holding of heart pillow and rocking momentum to achieve standing.  Pt reports wanting to return to sit after standing and required max encouragement to remain standing.   Ambulation/Gait Ambulation/Gait assistance: Mod assist, +2 safety/equipment Ambulation Distance (Feet): 8 Feet Assistive device: 4-wheeled walker(EVA walker) Gait Pattern/deviations: Shuffle, Trunk flexed, Step-through pattern General Gait Details: Pt remains anxious and became agitated after standing.  Encouraged patient to ambulate and required assistance with EVA walker and cues for LE progression to advance steps forward.  Pt performed  8 ft then refused further gait training.  Pt required cues for safety to back to seated surface before sitting.   Gait velocity: Decreased Gait velocity interpretation: Below normal speed for age/gender   ADL:   Cognition: Cognition Overall Cognitive Status: No family/caregiver present to determine baseline cognitive functioning Orientation Level: Oriented X4 Cognition Arousal/Alertness: Awake/alert Behavior During Therapy: Anxious Overall Cognitive  Status: No family/caregiver present to determine baseline cognitive functioning Area of Impairment: Attention, Following commands, Safety/judgement, Awareness, Problem solving Current Attention Level: Sustained Following Commands: Follows one step commands inconsistently Safety/Judgement: Decreased awareness of safety, Decreased awareness of deficits Awareness: Emergent Problem Solving: Requires verbal cues General Comments: Pt remains distracted during session.  Pt remains fixated on his O2 and wanted to know how much he was on, and how much this was going to cost.  he also reports seeing ice cubes on the floor despite nothing being on the floor, he insisted the ice was there.  Informed nursing of hallucinations.     Physical Exam: Blood pressure (!) 133/57, pulse 90, temperature 98.1 F (36.7 C), temperature source Oral, resp. rate (!) 30, height _0  (1.702 m), weight 88.3 kg (194 lb 10.7 oz), SpO2 97 %. Physical Exam  Nursing note and vitals reviewed. Constitutional: He appears well-developed and well-nourished. No distress.  Cortack in place. On 4 L oxygen per [place].   HENT:  Head: Normocephalic and atraumatic.  Mouth/Throat: Oropharynx is clear and moist.  Eyes: Conjunctivae and EOM are normal. Pupils are equal, round, and reactive to light.  Neck: Normal range of motion. Neck supple.  Cardiovascular: Normal rate.  Murmur heard.  Midline sternotomy incision clean dry and intact Respiratory: Effort normal and breath sounds normal. No stridor. No respiratory distress. He has no wheezes.  GI: Soft. Bowel sounds are normal. He exhibits no distension. There is no tenderness.  Musculoskeletal: He exhibits edema (miinimal edema bilateral feet).  Neurological: He is alert.  Oriented to self and place. Needed cues for situation. Tangential and needs redirection. Able to follow simple commands without difficulty.   Skin: Skin is warm and dry. He is not diaphoretic.  Sternal incision C/D/I.  Minimal bloody drainage from pacer wires sites and RLQ at prior Lovenox administration site.   Psychiatric: His affect is inappropriate. His speech is tangential. Cognition and memory are impaired. He expresses impulsivity. He is inattentive.   Strength is 4- bilateral deltoid bicep tricep grip hip flexion knee extension ankle dorsiflexion Intact light touch bilateral upper and lower limb    Lab Results Last 48 Hours        Results for orders placed or performed during the hospital encounter of 04/11/17 (from the past 48 hour(s))  Glucose, capillary     Status: Abnormal    Collection Time: 04/25/17  3:42 PM  Result Value Ref Range    Glucose-Capillary 129 (H) 65 - 99 mg/dL    Comment 1 Capillary Specimen      Comment 2 Notify RN    Glucose, capillary     Status: Abnormal    Collection Time: 04/25/17  8:12 PM  Result Value Ref Range    Glucose-Capillary 113 (H) 65 - 99 mg/dL  Glucose, capillary     Status: Abnormal    Collection Time: 04/25/17 11:32 PM  Result Value Ref Range    Glucose-Capillary 113 (H) 65 - 99 mg/dL    Comment 1 Notify RN    Procalcitonin     Status: None    Collection Time:  04/26/17  3:30 AM  Result Value Ref Range    Procalcitonin <0.10 ng/mL      Comment:        Interpretation: PCT (Procalcitonin) <= 0.5 ng/mL: Systemic infection (sepsis) is not likely. Local bacterial infection is possible. (NOTE)       Sepsis PCT Algorithm           Lower Respiratory Tract                                      Infection PCT Algorithm    ----------------------------     ----------------------------         PCT < 0.25 ng/mL                PCT < 0.10 ng/mL         Strongly encourage             Strongly discourage   discontinuation of antibiotics    initiation of antibiotics    ----------------------------     -----------------------------       PCT 0.25 - 0.50 ng/mL            PCT 0.10 - 0.25 ng/mL               OR       >80% decrease in PCT            Discourage  initiation of                                            antibiotics      Encourage discontinuation           of antibiotics    ----------------------------     -----------------------------         PCT >= 0.50 ng/mL              PCT 0.26 - 0.50 ng/mL               AND        <80% decrease in PCT             Encourage initiation of                                             antibiotics       Encourage continuation           of antibiotics    ----------------------------     -----------------------------        PCT >= 0.50 ng/mL                  PCT > 0.50 ng/mL               AND         increase in PCT                  Strongly encourage                                      initiation of antibiotics    Strongly encourage escalation  of antibiotics                                     -----------------------------                                           PCT <= 0.25 ng/mL                                                 OR                                        > 80% decrease in PCT                                     Discontinue / Do not initiate                                             antibiotics    Renal function panel     Status: Abnormal    Collection Time: 04/26/17  3:30 AM  Result Value Ref Range    Sodium 136 135 - 145 mmol/L    Potassium 3.2 (L) 3.5 - 5.1 mmol/L    Chloride 104 101 - 111 mmol/L    CO2 25 22 - 32 mmol/L    Glucose, Bld 96 65 - 99 mg/dL    BUN 27 (H) 6 - 20 mg/dL    Creatinine, Ser 0.66 0.61 - 1.24 mg/dL    Calcium 8.5 (L) 8.9 - 10.3 mg/dL    Phosphorus 2.2 (L) 2.5 - 4.6 mg/dL    Albumin 2.2 (L) 3.5 - 5.0 g/dL    GFR calc non Af Amer >60 >60 mL/min    GFR calc Af Amer >60 >60 mL/min      Comment: (NOTE) The eGFR has been calculated using the CKD EPI equation. This calculation has not been validated in all clinical situations. eGFR's persistently <60 mL/min signify possible Chronic Kidney Disease.      Anion gap 7 5 - 15  Glucose,  capillary     Status: None    Collection Time: 04/26/17  3:47 AM  Result Value Ref Range    Glucose-Capillary 88 65 - 99 mg/dL  .Cooxemetry Panel (carboxy, met, total hgb, O2 sat)     Status: Abnormal    Collection Time: 04/26/17  4:05 AM  Result Value Ref Range    Total hemoglobin 11.2 (L) 12.0 - 16.0 g/dL    O2 Saturation 65.7 %    Carboxyhemoglobin 1.2 0.5 - 1.5 %    Methemoglobin 1.1 0.0 - 1.5 %  Glucose, capillary     Status: Abnormal    Collection Time: 04/26/17  8:31 AM  Result Value Ref Range    Glucose-Capillary 116 (H) 65 - 99 mg/dL    Comment 1 Capillary Specimen    Magnesium  Status: None    Collection Time: 04/26/17  9:45 AM  Result Value Ref Range    Magnesium 2.2 1.7 - 2.4 mg/dL  Renal function panel     Status: Abnormal    Collection Time: 04/26/17  9:45 AM  Result Value Ref Range    Sodium 135 135 - 145 mmol/L    Potassium 4.0 3.5 - 5.1 mmol/L    Chloride 104 101 - 111 mmol/L    CO2 26 22 - 32 mmol/L    Glucose, Bld 131 (H) 65 - 99 mg/dL    BUN 29 (H) 6 - 20 mg/dL    Creatinine, Ser 0.74 0.61 - 1.24 mg/dL    Calcium 8.7 (L) 8.9 - 10.3 mg/dL    Phosphorus 2.5 2.5 - 4.6 mg/dL    Albumin 2.3 (L) 3.5 - 5.0 g/dL    GFR calc non Af Amer >60 >60 mL/min    GFR calc Af Amer >60 >60 mL/min      Comment: (NOTE) The eGFR has been calculated using the CKD EPI equation. This calculation has not been validated in all clinical situations. eGFR's persistently <60 mL/min signify possible Chronic Kidney Disease.      Anion gap 5 5 - 15  CBC     Status: Abnormal    Collection Time: 04/26/17  9:45 AM  Result Value Ref Range    WBC 22.1 (H) 4.0 - 10.5 K/uL    RBC 3.99 (L) 4.22 - 5.81 MIL/uL    Hemoglobin 10.9 (L) 13.0 - 17.0 g/dL    HCT 34.6 (L) 39.0 - 52.0 %    MCV 86.7 78.0 - 100.0 fL    MCH 27.3 26.0 - 34.0 pg    MCHC 31.5 30.0 - 36.0 g/dL    RDW 14.5 11.5 - 15.5 %    Platelets 726 (H) 150 - 400 K/uL  Glucose, capillary     Status: Abnormal    Collection Time:  04/26/17 11:27 AM  Result Value Ref Range    Glucose-Capillary 124 (H) 65 - 99 mg/dL    Comment 1 Capillary Specimen      Comment 2 Notify RN    Culture, Urine     Status: None    Collection Time: 04/26/17  3:07 PM  Result Value Ref Range    Specimen Description URINE, CLEAN CATCH      Special Requests NONE      Culture NO GROWTH      Report Status 04/27/2017 FINAL    Urinalysis, Routine w reflex microscopic     Status: Abnormal    Collection Time: 04/26/17  3:15 PM  Result Value Ref Range    Color, Urine YELLOW YELLOW    APPearance HAZY (A) CLEAR    Specific Gravity, Urine 1.024 1.005 - 1.030    pH 6.0 5.0 - 8.0    Glucose, UA NEGATIVE NEGATIVE mg/dL    Hgb urine dipstick SMALL (A) NEGATIVE    Bilirubin Urine NEGATIVE NEGATIVE    Ketones, ur NEGATIVE NEGATIVE mg/dL    Protein, ur 30 (A) NEGATIVE mg/dL    Nitrite NEGATIVE NEGATIVE    Leukocytes, UA NEGATIVE NEGATIVE    RBC / HPF 6-30 0 - 5 RBC/hpf    WBC, UA 6-30 0 - 5 WBC/hpf    Bacteria, UA RARE (A) NONE SEEN    Squamous Epithelial / LPF NONE SEEN NONE SEEN    Mucus PRESENT    Glucose, capillary     Status: Abnormal  Collection Time: 04/26/17  4:18 PM  Result Value Ref Range    Glucose-Capillary 120 (H) 65 - 99 mg/dL    Comment 1 Capillary Specimen    Glucose, capillary     Status: Abnormal    Collection Time: 04/26/17  8:13 PM  Result Value Ref Range    Glucose-Capillary 104 (H) 65 - 99 mg/dL  Glucose, capillary     Status: None    Collection Time: 04/26/17 11:31 PM  Result Value Ref Range    Glucose-Capillary 98 65 - 99 mg/dL    Comment 1 Capillary Specimen    Glucose, capillary     Status: Abnormal    Collection Time: 04/27/17  4:04 AM  Result Value Ref Range    Glucose-Capillary 115 (H) 65 - 99 mg/dL    Comment 1 Capillary Specimen    Procalcitonin     Status: None    Collection Time: 04/27/17  4:11 AM  Result Value Ref Range    Procalcitonin <0.10 ng/mL      Comment:        Interpretation: PCT  (Procalcitonin) <= 0.5 ng/mL: Systemic infection (sepsis) is not likely. Local bacterial infection is possible. (NOTE)       Sepsis PCT Algorithm           Lower Respiratory Tract                                      Infection PCT Algorithm    ----------------------------     ----------------------------         PCT < 0.25 ng/mL                PCT < 0.10 ng/mL         Strongly encourage             Strongly discourage   discontinuation of antibiotics    initiation of antibiotics    ----------------------------     -----------------------------       PCT 0.25 - 0.50 ng/mL            PCT 0.10 - 0.25 ng/mL               OR       >80% decrease in PCT            Discourage initiation of                                            antibiotics      Encourage discontinuation           of antibiotics    ----------------------------     -----------------------------         PCT >= 0.50 ng/mL              PCT 0.26 - 0.50 ng/mL               AND        <80% decrease in PCT             Encourage initiation of  antibiotics       Encourage continuation           of antibiotics    ----------------------------     -----------------------------        PCT >= 0.50 ng/mL                  PCT > 0.50 ng/mL               AND         increase in PCT                  Strongly encourage                                      initiation of antibiotics    Strongly encourage escalation           of antibiotics                                     -----------------------------                                           PCT <= 0.25 ng/mL                                                 OR                                        > 80% decrease in PCT                                     Discontinue / Do not initiate                                             antibiotics    Magnesium     Status: None    Collection Time: 04/27/17  4:11 AM  Result Value Ref Range     Magnesium 2.4 1.7 - 2.4 mg/dL  CBC     Status: Abnormal    Collection Time: 04/27/17  4:11 AM  Result Value Ref Range    WBC 17.1 (H) 4.0 - 10.5 K/uL    RBC 3.43 (L) 4.22 - 5.81 MIL/uL    Hemoglobin 9.4 (L) 13.0 - 17.0 g/dL    HCT 29.7 (L) 39.0 - 52.0 %    MCV 86.6 78.0 - 100.0 fL    MCH 27.4 26.0 - 34.0 pg    MCHC 31.6 30.0 - 36.0 g/dL    RDW 14.8 11.5 - 15.5 %    Platelets 643 (H) 150 - 400 K/uL  Renal function panel     Status: Abnormal    Collection Time: 04/27/17  4:11 AM  Result Value Ref Range    Sodium 139 135 -  145 mmol/L    Potassium 3.5 3.5 - 5.1 mmol/L    Chloride 106 101 - 111 mmol/L    CO2 28 22 - 32 mmol/L    Glucose, Bld 112 (H) 65 - 99 mg/dL    BUN 29 (H) 6 - 20 mg/dL    Creatinine, Ser 0.73 0.61 - 1.24 mg/dL    Calcium 8.3 (L) 8.9 - 10.3 mg/dL    Phosphorus 2.8 2.5 - 4.6 mg/dL    Albumin 2.2 (L) 3.5 - 5.0 g/dL    GFR calc non Af Amer >60 >60 mL/min    GFR calc Af Amer >60 >60 mL/min      Comment: (NOTE) The eGFR has been calculated using the CKD EPI equation. This calculation has not been validated in all clinical situations. eGFR's persistently <60 mL/min signify possible Chronic Kidney Disease.      Anion gap 5 5 - 15  .Cooxemetry Panel (carboxy, met, total hgb, O2 sat)     Status: Abnormal    Collection Time: 04/27/17  4:20 AM  Result Value Ref Range    Total hemoglobin 9.3 (L) 12.0 - 16.0 g/dL    O2 Saturation 65.4 %    Carboxyhemoglobin 1.1 0.5 - 1.5 %    Methemoglobin 1.1 0.0 - 1.5 %  Glucose, capillary     Status: Abnormal    Collection Time: 04/27/17  8:20 AM  Result Value Ref Range    Glucose-Capillary 101 (H) 65 - 99 mg/dL    Comment 1 Notify RN    Glucose, capillary     Status: Abnormal    Collection Time: 04/27/17 12:23 PM  Result Value Ref Range    Glucose-Capillary 100 (H) 65 - 99 mg/dL    Comment 1 Arterial Specimen    Vancomycin, trough     Status: Abnormal    Collection Time: 04/27/17 12:24 PM  Result Value Ref Range     Vancomycin Tr 22 (HH) 15 - 20 ug/mL      Comment: CRITICAL RESULT CALLED TO, READ BACK BY AND VERIFIED WITH: H.BOWMAN,RN 04/27/17 1306 B.DAVIS         Imaging Results (Last 48 hours)  Dg Chest Port 1 View   Result Date: 04/27/2017 CLINICAL DATA:  Respiratory failure, shortness of Breath EXAM: PORTABLE CHEST 1 VIEW COMPARISON:  04/26/2017 FINDINGS: Cardiomegaly with vascular congestion. Small left pleural effusion, stable. Left lower lobe atelectasis or consolidation, unchanged. No focal opacity on the right. Right PICC line is unchanged. IMPRESSION: Cardiomegaly with vascular congestion. Left lower lobe opacity and small left effusion, stable. Electronically Signed   By: Rolm Baptise M.D.   On: 04/27/2017 08:36    Dg Chest Port 1 View   Result Date: 04/26/2017 CLINICAL DATA:  Pleural effusion EXAM: PORTABLE CHEST 1 VIEW COMPARISON:  04/25/2017 FINDINGS: Right arm PICC tip in the SVC in good position. Right jugular central venous catheter tip removed. Feeding tube enters the stomach with the tip not visualized Cardiac enlargement with median sternotomy wires. Negative for heart failure Improved aeration in the lung bases. Right lung base clear. Improvement in left lower lobe atelectasis/ infiltrate. Small left effusion. IMPRESSION: Improved aeration in the lung bases with persistent left lower lobe airspace disease and small left effusion. Negative for heart failure. Electronically Signed   By: Franchot Gallo M.D.   On: 04/26/2017 09:04             Medical Problem List and Plan: 1.    Debility secondary to coronary  artery disease and left ventricular rupture with acute systolic heart failure 2.  DVT Prophylaxis/Anticoagulation: Pharmaceutical: Lovenox 3. Pain Management: Use acetaminophen as needed 4. Mood: [name] to follow for evaluation and support. 5. Neuropsych: This patient is not fully capable of making decisions on his own behalf. 6. Skin/Wound Care: Monitor surgical wounds daily for  healing .\ 7. Fluids/Electrolytes/Nutrition: Monitor I/O. Add water flushes qid. Change tube feeds to evening to promote hunger. Does not like pureed foods. Continue to monitor CBGs 8. ESBL E coli (penile meatus): On meropenum (day 9/10?) and Vancomycin day# 3 9. Hypokalemia: Being  Managed with runs of K+--will add supplement via PEG. 10. Anoxic BI? /Delirium:  Continue Seroquel and wean over next few days.  11.  Leucocytosis: Continue to monitor for trends -20.0 -->22.1-->17.1. Has been afebrile X 24 hours 12. PAF: In NSR on amiodarone 200 mg bid and coreg added on 11/29 13. COPD with chronic hypoxic/hypercarbic failure:  Requiring 4 L oxygen per [place]--wean as able. Encourage IS 14. Acute systolic heart failure due to cardiogenic shock: EF improved to 40%. No lasix. Monitor weight for signs of overload. Check weights daily.  15. Hypokalemia: K+ trending back down. Will schedule bid with daily checks for now.  16. ABLA: Monitor H/H serially. Check CBC in am.      Post Admission Physician Evaluation: 1. Functional deficits secondary  to debility. 2. Patient is admitted to receive collaborative, interdisciplinary care between the physiatrist, rehab nursing staff, and therapy team. 3. Patient's level of medical complexity and substantial therapy needs in context of that medical necessity cannot be provided at a lesser intensity of care such as a SNF. 4. Patient has experienced substantial functional loss from his/her baseline which was documented above under the "Functional History" and "Functional Status" headings.  Judging by the patient's diagnosis, physical exam, and functional history, the patient has potential for functional progress which will result in measurable gains while on inpatient rehab.  These gains will be of substantial and practical use upon discharge  in facilitating mobility and self-care at the household level. 5. Physiatrist will provide 24 hour management of medical needs as  well as oversight of the therapy plan/treatment and provide guidance as appropriate regarding the interaction of the two. 6. The Preadmission Screening has been reviewed and patient status is unchanged unless otherwise stated above. 7. 24 hour rehab nursing will assist with bladder management, bowel management, safety, skin/wound care, disease management, medication administration and patient education  and help integrate therapy concepts, techniques,education, etc. 8. PT will assess and treat for/with: pre gait, gait training, endurance , safety, equipment, neuromuscular re education.   Goals are: Mod I. 9. OT will assess and treat for/with: ADLs, Cognitive perceptual skills, Neuromuscular re education, safety, endurance, equipment.   Goals are: Mod I. Therapy may proceed with showering this patient. 10. SLP will assess and treat for/with: Cognition, swallowing.  Goals are: Safe and adequate p.o. intake, independent med management. 11. Case Management and Social Worker will assess and treat for psychological issues and discharge planning. 12. Team conference will be held weekly to assess progress toward goals and to determine barriers to discharge. 13. Patient will receive at least 3 hours of therapy per day at least 5 days per week. 14. ELOS: 17-21d       15. Prognosis:  good         Charlett Blake M.D. [facility] FAAPM&R (Sports Med, Neuromuscular Med) Diplomate Am Board of Electrodiagnostic Med  Love,  Ivan Anchors, PA-C 04/27/2017

## 2017-04-27 NOTE — PMR Pre-admission (Signed)
PMR Admission Coordinator Pre-Admission Assessment  Patient: [name] is an 66 y.o., male MRN: [id number] DOB: [birth year] Height: 5\' 7"  (170.2 cm) Weight: 88.3 kg (194 lb 10.7 oz)(standing scale)              Insurance Information HMO: No    PPO:       PCP:       IPA:       80/20:       OTHER:   PRIMARY: Medicare part B only      Policy#: [id number]      Subscriber: Ronald HoveGary Mcmorris CM Name:        Phone#:       Fax#:   Pre-Cert#:        Employer:  Retired Benefits:  Phone #:       Name: Checked in Passport one source [place]. Date: 12/29/14     Deduct:        Out of Pocket Max:        Life Max:   CIR:        SNF:   Outpatient:       Co-Pay:   Home Health:        Co-Pay:   DME:       Co-Pay:   Providers:    SECONDARY: Medicaid [place] access      Policy#: [id number]      Subscriber: patient CM Name:        Phone#:       Fax#:   Pre-Cert#:        Employer: Retired Benefits:  Phone #: [phone]     Name: Automated Eff. Date: Eligible 04/26/17 with [facility] code [facility]     Deduct:        Out of Pocket Max:        Life Max:   CIR:        SNF:   Outpatient:       Co-Pay:   Home Health:        Co-Pay:   DME:       Co-Pay:    Emergency Contact Information Contact Information    Name Relation Home Work Mobile   Davis,[name] Sister   [phone]     Current Medical History  Patient Admitting Diagnosis:  Debility  History of Present Illness: A 66 y.o. male with history of CAD, HTN, COPD, chronic neck pain;  who was admitted on 04/11/17 with SOB and malaise due to inferior lateral STEMI, lethargy and cardiogenic shock requiring intubation in ED. History taken from chart review and sister.  He underwent cardiac cath with RCA DES and balloon angioplasty of mid LAD. He required IABP as well as neo due to hypotension. Echo showed diffuse hypokinesis with moderate pericardial effusion and EF 25-30%.  Dr. Tyrone SageGerhardt consulted due to concerns of LV rupture and TEE showed contained rupture with  extensive thrombus. He was taken to OR for CABG with resection of LV aneurysm with placement of impella 11/14 and underwent chest closure on 11/17.  Hospital course significant for E coli in 1/2 blood cultures and wound--antibiotic coverage broadened to meropenum but with upward trend in temps today. He has been weaned of Neo and treated for acute on chronic systolic CHF. He tolerated extubation on 11/26 and remains NPO due signs of weak cough and signs of dysphagia--MBS schedule for today. Therapy evaluations done yesterday revealing debility and CIR recommended due to functional deficits.    Past Medical History  Past Medical  History:  Diagnosis Date  . Coronary artery disease   . Hypertension     Family History  family history is not on file.  Prior Rehab/Hospitalizations: No previous rehab  Has the patient had major surgery during 100 days prior to admission? No  Current Medications   Current Facility-Administered Medications:  .  0.9 %  sodium chloride infusion, 250 mL, Intravenous, Continuous, Gold, Wayne E, PA-C .  0.9 %  sodium chloride infusion, , Intravenous, Once, Kelly SplinterHawkins, Joshua B, CRNA, Last Rate: 10 mL/hr at 04/26/17 0800 .  acetaminophen (TYLENOL) solution 650 mg, 650 mg, Per Tube, Q6H PRN, Roslynn AmbleNestor, Jennings E, MD, 650 mg at 04/26/17 1736 .  albuterol (PROVENTIL) (2.5 MG/3ML) 0.083% nebulizer solution 2.5 mg, 2.5 mg, Nebulization, Q3H PRN, Ollis, Brandi L, NP, 2.5 mg at 04/21/17 2243 .  amiodarone (PACERONE) tablet 200 mg, 200 mg, Per Tube, BID, Bensimhon, Bevelyn Bucklesaniel R, MD, 200 mg at 04/27/17 1106 .  aspirin chewable tablet 81 mg, 81 mg, Per Tube, Daily, Delight OvensGerhardt, Edward B, MD, 81 mg at 04/27/17 1106 .  atorvastatin (LIPITOR) tablet 80 mg, 80 mg, Per Tube, q1800, Delight OvensGerhardt, Edward B, MD, 80 mg at 04/26/17 1709 .  bisacodyl (DULCOLAX) EC tablet 10 mg, 10 mg, Oral, Daily, 10 mg at 04/27/17 1106 **OR** bisacodyl (DULCOLAX) suppository 10 mg, 10 mg, Rectal, Daily, Gold, Wayne E, PA-C,  10 mg at 04/16/17 1121 .  carvedilol (COREG) tablet 3.125 mg, 3.125 mg, Oral, BID WC, Bensimhon, Bevelyn Bucklesaniel R, MD .  Chlorhexidine Gluconate Cloth 2 % PADS 6 each, 6 each, Topical, Daily, Delight OvensGerhardt, Edward B, MD, 6 each at 04/27/17 1107 .  enoxaparin (LOVENOX) injection 40 mg, 40 mg, Subcutaneous, Q24H, Bensimhon, Bevelyn Bucklesaniel R, MD, 40 mg at 04/26/17 2111 .  feeding supplement (PRO-STAT SUGAR FREE 64) liquid 30 mL, 30 mL, Per Tube, BID, Delight OvensGerhardt, Edward B, MD, 30 mL at 04/27/17 1105 .  feeding supplement (VITAL HIGH PROTEIN) liquid 1,000 mL, 1,000 mL, Per Tube, Continuous, Delight OvensGerhardt, Edward B, MD, Last Rate: 50 mL/hr at 04/27/17 0207, 1,000 mL at 04/27/17 0207 .  fentaNYL (SUBLIMAZE) injection 25-50 mcg, 25-50 mcg, Intravenous, Q2H PRN, Roslynn AmbleNestor, Jennings E, MD, 50 mcg at 04/24/17 2131 .  Gerhardt's butt cream, , Topical, PRN, Delight OvensGerhardt, Edward B, MD, 1 application at 04/18/17 1437 .  guaiFENesin (ROBITUSSIN) 100 MG/5ML solution 100 mg, 5 mL, Per Tube, Q6H PRN, Joycelyn ManZimmerman, Donielle M, PA-C .  insulin aspart (novoLOG) injection 0-24 Units, 0-24 Units, Subcutaneous, Q4H, Delight OvensGerhardt, Edward B, MD, 2 Units at 04/26/17 1716 .  lactated ringers infusion, , Intravenous, Continuous, Gold, Glenice LaineWayne E, PA-C, Stopped at 04/23/17 1900 .  MEDLINE mouth rinse, 15 mL, Mouth Rinse, BID, Alyson ReedyYacoub, Wesam G, MD, 15 mL at 04/27/17 1000 .  meropenem (MERREM) 1 g in sodium chloride 0.9 % 100 mL IVPB, 1 g, Intravenous, Q8H, Bensimhon, Bevelyn Bucklesaniel R, MD, Last Rate: 200 mL/hr at 04/27/17 0626, 1 g at 04/27/17 0626 .  ondansetron (ZOFRAN) injection 4 mg, 4 mg, Intravenous, Q6H PRN, Gold, Wayne E, PA-C .  pantoprazole sodium (PROTONIX) 40 mg/20 mL oral suspension 40 mg, 40 mg, Per Tube, Daily, Adline PotterDunham, Sabrina, RPH, 40 mg at 04/27/17 1105 .  QUEtiapine (SEROQUEL) tablet 50 mg, 50 mg, Per Tube, BID, Roslynn AmbleNestor, Jennings E, MD, 50 mg at 04/27/17 1106 .  RESOURCE THICKENUP CLEAR, , Oral, PRN, Sheliah PlaneGerhardt, Edward B, MD .  sodium chloride flush (NS) 0.9 % injection  10-40 mL, 10-40 mL, Intracatheter, Q12H, Delight OvensGerhardt, Edward B, MD, 10 mL at 04/26/17  1012 .  sodium chloride flush (NS) 0.9 % injection 10-40 mL, 10-40 mL, Intracatheter, PRN, Delight Ovens, MD .  [COMPLETED] ticagrelor [facility]) tablet 180 mg, 180 mg, Per Tube, Once, 180 mg at 04/18/17 0925 **FOLLOWED BY** ticagrelor (BRILINTA) tablet 90 mg, 90 mg, Per Tube, BID, Adline Potter, RPH, 90 mg at 04/27/17 1106 .  vancomycin (VANCOCIN) IVPB 1000 mg/200 mL premix, 1,000 mg, Intravenous, Q8H, Adline Potter, RPH, Stopped at 04/27/17 0515  Patients Current Diet: DIET - DYS 1 Room service appropriate? Yes; Fluid consistency: Pudding Thick  Precautions / Restrictions Precautions Precautions: Sternal, Fall Precaution Comments: Pt has difficulty following precautions despite cues and use of heart pillow.   Restrictions Weight Bearing Restrictions: Yes Other Position/Activity Restrictions: Sternal precautions   Has the patient had 2 or more falls or a fall with injury in the past year?No  Prior Activity Level Community (5-7x/wk): Went out daily, was driving up until 6-8 months ago when he developed car trouble.  Home Assistive Devices / Equipment Home Assistive Devices/Equipment: None  Prior Device Use: Indicate devices/aids used by the patient prior to current illness, exacerbation or injury? None  Prior Functional Level Prior Function Comments: Pt with increased distraction throughout session; will need to get further home set-up, support, and PLOF details during next treatment session  Self Care: Did the patient need help bathing, dressing, using the toilet or eating?  Independent  Indoor Mobility: Did the patient need assistance with walking from room to room (with or without device)? Independent  Stairs: Did the patient need assistance with internal or external stairs (with or without device)? Independent  Functional Cognition: Did the patient need help planning regular tasks such  as shopping or remembering to take medications? Independent  Current Functional Level Cognition  Overall Cognitive Status: No family/caregiver present to determine baseline cognitive functioning Current Attention Level: Sustained Orientation Level: Oriented X4 Following Commands: Follows one step commands inconsistently Safety/Judgement: Decreased awareness of safety, Decreased awareness of deficits General Comments: Pt remains distracted during session.  Pt remains fixated on his O2 and wanted to know how much he was on, and how much this was going to cost.  he also reports seeing ice cubes on the floor despite nothing being on the floor, he insisted the ice was there.  Informed nursing of hallucinations.      Extremity Assessment (includes Sensation/Coordination)  Upper Extremity Assessment: Generalized weakness  Lower Extremity Assessment: Generalized weakness    ADLs       Mobility  Overal bed mobility: Needs Assistance Bed Mobility: Sit to Supine Sit to supine: Min assist General bed mobility comments: Pt sitting in recliner on arrival but requires assistance to elevate trunk from back of chair to prepare for transfer.  Pt reaching for arms rests to pull and instructed to avoid pulling.  Constant redirection to hug heart pillow.      Transfers  Overall transfer level: Needs assistance Equipment used: 4-wheeled walker(EVA walker) Transfers: Sit to/from Stand Sit to Stand: Mod assist, +2 safety/equipment General transfer comment: Pt remains to require education to adhere to sternal precautions during sit to stand transfers.  Pt required holding of heart pillow and rocking momentum to achieve standing.  Pt reports wanting to return to sit after standing and required max encouragement to remain standing.      Ambulation / Gait / Stairs / Wheelchair Mobility  Ambulation/Gait Ambulation/Gait assistance: Mod assist, +2 safety/equipment Ambulation Distance (Feet): 8 Feet Assistive  device: 4-wheeled walker(EVA walker) Gait Pattern/deviations: Shuffle,  Trunk flexed, Step-through pattern General Gait Details: Pt remains anxious and became agitated after standing.  Encouraged patient to ambulate and required assistance with EVA walker and cues for LE progression to advance steps forward.  Pt performed 8 ft then refused further gait training.  Pt required cues for safety to back to seated surface before sitting.   Gait velocity: Decreased Gait velocity interpretation: Below normal speed for age/gender    Posture / Balance Balance Overall balance assessment: Needs assistance Sitting balance-Leahy Scale: Fair Standing balance-Leahy Scale: Poor    Special needs/care consideration BiPAP/CPAP No CPM No Continuous Drip IV KVO Dialysis No      Life Vest No Oxygen 02 3 L [place] Special Bed No Trach Size No Wound Vac (area) NO      Skin:  Post op sternal incision; Had plastic surgery in 2010 after a dog bite.                             Bowel mgmt: Last BM 04/26/17 with incontinence Bladder mgmt: Voiding WDL Diabetic mgmt No    Previous Home Environment Living Arrangements: Spouse/significant other(Lives with [place] significant other male)  Lives With: Significant other Available Help at Discharge: Family, Available 24 hours/day(Can go home with his sister. Vietnamese significant other works a lot.) Type of Home: Apartment Home Layout: One level Home Access: Stairs to enter [employer] of Steps: Patient's apartment with 6-8 steps back and front entry. Home Care Services: No Additional Comments: Pt with increased distraction throughout session; will need to get further home set-up, support, and PLOF details during next treatment session  Discharge Living Setting Plans for Discharge Living Setting: Lives with (comment), Apartment(May go home with his siter.) Type of Home at Discharge: Apartment Discharge Home Layout: One level Discharge Home Access:  Stairs to enter Entrance Stairs-Number of Steps: 1 step to porch and 1 step into apartment. Does the patient have any problems obtaining your medications?: No  Social/Family/Support Systems Patient Roles: Other (Comment)(Has a significant other and a sister.) Contact Information: Theodoro Doing - sister - [phone] Anticipated Caregiver: Sister Ability/Limitations of Caregiver: Significant other works a lot.  Sister is disabled, not working and can assist.  Per sister patient can discharge home with her after rehab stay. Caregiver Availability: 24/7 Discharge Plan Discussed with Primary Caregiver: Yes Is Caregiver In Agreement with Plan?: Yes Does Caregiver/Family have Issues with Lodging/Transportation while Pt is in Rehab?: No  Goals/Additional Needs Patient/Family Goal for Rehab: PT/OT supervision to min assist goals; SLP mod I goals Expected length of stay: 18-22 days Cultural Considerations: Christian Dietary Needs: Dys 1, pudding thick liquids Equipment Needs: TBD Pt/Family Agrees to Admission and willing to participate: Yes Program Orientation Provided & Reviewed with Pt/Caregiver Including Roles  & Responsibilities: Yes  Decrease burden of Care through IP rehab admission: N/A  Possible need for SNF placement upon discharge: Not planned  Patient Condition: This patient's condition remains as documented in the consult dated 04/26/17, in which the Rehabilitation Physician determined and documented that the patient's condition is appropriate for intensive rehabilitative care in an inpatient rehabilitation facility. Will admit to inpatient rehab today.  Preadmission Screen Completed By:  Trish Mage, 04/27/2017 1:02 PM ______________________________________________________________________   Discussed status with Dr. Wynn Banker on 04/27/17 at 1302 and received telephone approval for admission today.  Admission Coordinator:  Trish Mage, time 1302/Date 04/27/17

## 2017-04-27 NOTE — Progress Notes (Signed)
Nutrition Follow-up  DOCUMENTATION CODES:   Obesity unspecified  INTERVENTION:   -Magic cup TID with meals, each supplement provides 290 kcal and 9 grams of protein  Tube Feeding:  Recommend changing to Vital 1.5 @ 50 ml/hr providing 1800 kcals, 82 g of protein and 912 mL of free water. Meets 82% protein needs, 90% calorie needs.  Consider changing to night-time feedings on follow-up  NUTRITION DIAGNOSIS:   Inadequate oral intake related to acute illness, inability to eat as evidenced by NPO status.  Continues but being addressed as diet advanced, supplemented, tube feeding  GOAL:   Patient will meet greater than or equal to 90% of their needs  Progressing  MONITOR:   Diet advancement, Labs, Weight trends  REASON FOR ASSESSMENT:   Ventilator    ASSESSMENT:   66 yo male admitted with sunset onset of SOB and chest pain, found to have inferolateral STEMI and taken to cath lab (distal RCA lesion with DES placed, 100% stenosed mid LAD lesion that was balloon angioplastied, IABP placed). Pt intubated prior to cardiac cath. Pt found to have LV aneurysm on 11/14 and taking emergently to OR. Pt with hx of CAD, HTN  Pt remains very weak, alert, confused at times.  Pt being discharged to inpatient rehab today.  11/14 Impella 5.0 placed  11/16 Chest closed 11/19 Impella removed 11/26 Extubated 11/29 Diet advanced to Dysphagia I, Pudding Thick post MBSS  Pt reports he ate about 50% of breakfast tray this AM. Reports he likes the Magic Cup supplement  Tolerating Vital High Protein at rate of 50 ml/hr via Cortrak tube  Weight down since admission but net negative 13 L per I/O flowsheet  Labs: reviewed Meds: reviewed   Diet Order:  DIET - DYS 1 Room service appropriate? Yes; Fluid consistency: Pudding Thick  EDUCATION NEEDS:   Not appropriate for education at this time  Skin:  Skin Assessment: Reviewed RN Assessment Skin Integrity Issues:: Incisions Incisions:  chest  Last BM:  11/28  Height:   Ht Readings from Last 1 Encounters:  04/13/17 5\' 7"  (1.702 m)    Weight:   Wt Readings from Last 1 Encounters:  04/27/17 194 lb 10.7 oz (88.3 kg)    Ideal Body Weight:  67.3 kg  BMI:  Body mass index is 30.49 kg/m.  Estimated Nutritional Needs:   Kcal:  1995-2300 kcals  Protein:  100-115 g   Fluid:  >/= 2 L  Romelle Starcherate Bentli Llorente MS, RD, LDN, CNSC [phone] Pager  [phone] Weekend/On-Call Pager

## 2017-04-27 NOTE — Progress Notes (Signed)
Pt assessed for PRN Albuterol neb and Acapella/Flutter device.  IS and Acapella already at bedside.  Re-instructed pt in use, able to use effectively with good effort and technique. Pt denied SOB or lung tightness, breath sounds CTA somewhat diminished.  Pt aware to call for neb if needed for SOB, wheeze unrelieved by cough and difficulty breathing.

## 2017-04-27 NOTE — Progress Notes (Signed)
Patient was informed about rehab process including the patient safety plan and rehab booklet.

## 2017-04-27 NOTE — Progress Notes (Addendum)
Advanced Heart Failure Rounding Note   Subjective:    - S/P LV pseudoaneurysm repeat 11/14 Impella 5.0 Placed.  - Chest closed 11/16.  - Started on broad spectrum abx on 11/17 for fever and leukocytosis.  - Impella noted to have poor waveform am of 11/19. Thought to have possible clot.  Heparin restarted and pump pulled afternoon of 04/17/17. - Extubated 11/26.  Remains weak. Mostly oriented but pleasantly confused at times. Denies pain. No SOB. CVP 5-6 (checked personally)    Objective:   Weight Range: 194 lb 10.7 oz (88.3 kg) Body mass index is 30.49 kg/m.   Vital Signs:   Temp:  [98.2 F (36.8 C)-100.4 F (38 C)] 98.3 F (36.8 C) (11/29 0825) Pulse Rate:  [38-103] 85 (11/29 0800) Resp:  [25-41] 38 (11/29 0900) BP: (104-142)/(47-90) 138/61 (11/29 0900) SpO2:  [97 %-100 %] 97 % (11/29 0800) Weight:  [194 lb 10.7 oz (88.3 kg)] 194 lb 10.7 oz (88.3 kg) (11/29 0800) Last BM Date: 04/26/17  Weight change: Filed Weights   04/25/17 0449 04/26/17 0500 04/27/17 0800  Weight: 186 lb 11.7 oz (84.7 kg) 194 lb 3.6 oz (88.1 kg) 194 lb 10.7 oz (88.3 kg)   Intake/Output:   Intake/Output Summary (Last 24 hours) at 04/27/2017 0948 Last data filed at 04/27/2017 0900 Gross per 24 hour  Intake 1783 ml  Output 960 ml  Net 823 ml    Physical Exam    General:  No resp difficulty. Sitting in the chair HEENT: normal x for  [place]  Neck: supple. no JVD. Carotids 2+ bilat; no bruits. No lymphadenopathy or thryomegaly appreciated. Cor: PMI nondisplaced. Regular rate & rhythm. Distant HS No rubs, gallops or murmurs. Lungs: clear Abdomen: soft, nontender, nondistended. No hepatosplenomegaly. No bruits or masses. Good bowel sounds. Extremities: no cyanosis, clubbing, rash, edema Neuro: Pleasantly confused but orientedx3, cranial nerves grossly intact. moves all 4 extremities w/o difficulty. Affect pleasant   Telemetry   NSr 80-90s personally reviewed.  EKG   N/A  Labs      CBC Recent Labs    04/26/17 0945 04/27/17 0411  WBC 22.1* 17.1*  HGB 10.9* 9.4*  HCT 34.6* 29.7*  MCV 86.7 86.6  PLT 726* 643*   Basic Metabolic Panel Recent Labs    91/47/8211/28/18 0945 04/27/17 0411  NA 135 139  K 4.0 3.5  CL 104 106  CO2 26 28  GLUCOSE 131* 112*  BUN 29* 29*  CREATININE 0.74 0.73  CALCIUM 8.7* 8.3*  MG 2.2 2.4  PHOS 2.5 2.8   Liver Function Tests Recent Labs    04/26/17 0945 04/27/17 0411  ALBUMIN 2.3* 2.2*   No results for input(s): LIPASE, AMYLASE in the last 72 hours. Cardiac Enzymes No results for input(s): CKTOTAL, CKMB, CKMBINDEX, TROPONINI in the last 72 hours.  BNP: BNP (last 3 results) No results for input(s): BNP in the last 8760 hours.  ProBNP (last 3 results) No results for input(s): PROBNP in the last 8760 hours.   D-Dimer No results for input(s): DDIMER in the last 72 hours. Hemoglobin A1C No results for input(s): HGBA1C in the last 72 hours. Fasting Lipid Panel No results for input(s): CHOL, HDL, LDLCALC, TRIG, CHOLHDL, LDLDIRECT in the last 72 hours. Thyroid Function Tests No results for input(s): TSH, T4TOTAL, T3FREE, THYROIDAB in the last 72 hours.  Invalid input(s): FREET3  Other results:   Imaging    Dg Chest Port 1 View  Result Date: 04/27/2017 CLINICAL DATA:  Respiratory failure,  shortness of Breath EXAM: PORTABLE CHEST 1 VIEW COMPARISON:  04/26/2017 FINDINGS: Cardiomegaly with vascular congestion. Small left pleural effusion, stable. Left lower lobe atelectasis or consolidation, unchanged. No focal opacity on the right. Right PICC line is unchanged. IMPRESSION: Cardiomegaly with vascular congestion. Left lower lobe opacity and small left effusion, stable. Electronically Signed   By: Charlett Nose M.D.   On: 04/27/2017 08:36     Medications:     Scheduled Medications: . amiodarone  200 mg Per Tube BID  . aspirin  81 mg Per Tube Daily  . atorvastatin  80 mg Per Tube q1800  . bisacodyl  10 mg Oral Daily    Or  . bisacodyl  10 mg Rectal Daily  . Chlorhexidine Gluconate Cloth  6 each Topical Daily  . enoxaparin (LOVENOX) injection  40 mg Subcutaneous Q24H  . feeding supplement (PRO-STAT SUGAR FREE 64)  30 mL Per Tube BID  . insulin aspart  0-24 Units Subcutaneous Q4H  . mouth rinse  15 mL Mouth Rinse BID  . pantoprazole sodium  40 mg Per Tube Daily  . potassium chloride  20 mEq Per Tube Daily  . QUEtiapine  50 mg Per Tube BID  . sodium chloride flush  10-40 mL Intracatheter Q12H  . ticagrelor  90 mg Per Tube BID    Infusions: . sodium chloride    . sodium chloride 10 mL/hr at 04/26/17 0800  . feeding supplement (VITAL HIGH PROTEIN) 1,000 mL (04/27/17 0207)  . lactated ringers Stopped (04/23/17 1900)  . meropenem (MERREM) IV 1 g (04/27/17 0626)  . vancomycin Stopped (04/27/17 0515)    PRN Medications: acetaminophen (TYLENOL) oral liquid 160 mg/5 mL, albuterol, fentaNYL (SUBLIMAZE) injection, Gerhardt's butt cream, guaiFENesin, ondansetron (ZOFRAN) IV, RESOURCE THICKENUP CLEAR, sodium chloride flush    Patient Profile   Mr Wentworth is 66 year old with h/o MI, HTN, COPD admitted with chest pain. EKG with evidence of inferior/lateral STEMI. Due to lethargy and hypoxemia he was intubated in the ED.  Taken to cath lab with RCA DES and POBA to chronically occluded LAD with IABP placed.   Had LV pseudo aneurysm repair 11/14 .   Assessment/Plan   1. CAD with OOH anterior MI c/b LV pseudoaneurysm and cardiogenic shock - attempted PCI of LAD failed due to chronic occlusion and extensive infarct - s/p PCI/DES to distal RCA Remains on ASA, brilinta, atorvastatin - No S/S ischemia.   2.  S/P LV pseudoaneurysm repair 11/14 - Chest closed 04/13/17. Stable.   3. Acute systolic HF-> cardiogenic shock - Impella removed 04/17/17 - EF improved 25%->40% on recent intra-op TEE -  4. Acute Hypoxic Respiratory Failure  - Intubated 11/13.  - Has severe COPD. Baseline PCO2 60s  - Extubated  11/26  5. AKI  - Due to ATN shock  - Resolved.   6. COPD with chronic CO2 retention  - Stable. No wheeze.   7. ESBL E coli culture -04/15/17 ESBL from skin culture (penile meatus) - On vanc. On meropenem.   WBC coming down   - Tmax 100.4    8. PAF - In NSR. Continue amio 200 mg twice a day.   9. Hypokalemia - K 3.5  Repeat BMET in am.   10. Severe Deconditioning Consult PT/OT. CIR consulted--> CIR  Ok to go to [facility] from Heart Failure Team. Final approval from [facility]CTS.   Length of Stay: 16  Tonye Becket, NP  04/27/2017, 9:48 AM  Advanced Heart Failure Team Pager [phone] (M-F;  7a - 4p)   Please contact [facility] for night-coverage after hours (4p -7a ) and weekends on [email or web address]  Patient seen and examined with Tonye BecketAmy Clegg, NP. We discussed all aspects of the encounter. I agree with the assessment and plan as stated above.   He remains very weak but much improved from HF perspective. He is oriented but affect a bit odd and he seems a bit confused at times. Not sure if this is baseline or if he has had mild anoxic injury. Remains in NSR. Continue amio. Still with low-grade temps. CXR and UA ok. WBC improving with addition ov vanc. Can stop meropenem and treat with 5-7 day course of vanc. BCx drawn yesterday. Will follow. Continue IS. Start low-dose b-blocker.   [facility]. He needs aggressive rehab.  Arvilla Meresaniel Keivon Garden, MD  10:18 AM

## 2017-04-27 NOTE — Discharge Instructions (Signed)
Activity: 1.May walk up steps                2.No lifting more than ten pounds for four weeks.                 3.No driving for four weeks.                4.Stop any activity that causes chest pain, shortness of breath, dizziness, sweating or excessive weakness.                5.Avoid straining.                6.Continue with your breathing exercises daily.  Diet: Dysphagia 1 (Puree) solids;Pudding thick liquid   Liquid Administration via: Spoon   Medication Administration: Crushed with puree   Supervision: Patient able to self feed;Full supervision/cueing for compensatory strategies   Compensations: Slow rate;Small sips/bites   Postural Changes: Seated upright at 90 degrees   Oral Care Recommendations: Oral care BID   Other Recommendations: Order thickener from pharmacy;Prohibited food (jello, ice cream, thin soups);Remove water pitcher     Wound Care: May shower.  Clean wounds with mild soap and water daily. Contact the office at [phone] if any problems arise.

## 2017-04-27 NOTE — Progress Notes (Signed)
CARDIAC REHAB PHASE I  Pt getting ready to discharge to CIR, no family present at bedside. Briefly spoke with pt, discussed phase 2 cardiac rehab, left MI book, tobacco cessation information, heart healthy diet handout and phase 2 cardiac rehab brochure with pt's RN to send with pt to CIR.  [facility] agrees to phase 2 [facility] referral[facility]  Spoke with pt's sister, Olegario MessierKathy over the phone with pt's permission, discussed above information. Pt's sister verbalized understanding. Notified pt's sister that pt discharging to [phone]. Pt in bed, call bell within reach.    [phone] Joylene GrapesEmily C Damarien Nyman, RN, BSN 04/27/2017 3:11 PM

## 2017-04-27 NOTE — Progress Notes (Signed)
Pharmacy Antibiotic Note  [name] is a 66 y.o. male admitted on 04/11/2017 with LV rupture, pharmacy is being consulted for antibiotic treatment for ESBL E.Coli perineal infection/possible PNA. Pt is on day 9 of meropenem, WBC trending down today 17.1, patient has remained afebrile for >24 hours. SCr stable. PCT <0.10.  Vanc trough 22, drawn one hour late.   Plan: -Continue Meropenem 1g IV q8h until tomorrow 11/30 -Decrease vancomycin to 850 mg IV Q8hr -Monitor renal funx, cultures, LOT -Check VT as appropriate  Height: 5\' 7"  (170.2 cm) Weight: 194 lb 10.7 oz (88.3 kg)(standing scale) IBW/kg (Calculated) : 66.1  Temp (24hrs), Avg:98.5 F (36.9 C), Min:98.1 F (36.7 C), Max:99.2 F (37.3 C)  Recent Labs  Lab 04/23/17 0416 04/24/17 0423 04/24/17 0424 04/25/17 0337 04/26/17 0330 04/26/17 0945 04/27/17 0411 04/27/17 1224  WBC 17.7* 20.0*  --  20.7*  --  22.1* 17.1*  --   CREATININE 0.84  --  0.80 0.65 0.66 0.74 0.73  --   VANCOTROUGH  --   --   --   --   --   --   --  22*    Estimated Creatinine Clearance: 93.8 mL/min (by C-G formula based on SCr of 0.73 mg/dL).    No Known Allergies  Adline PotterSabrina Breandan People, PharmD Pharmacy Resident Pager: [phone]

## 2017-04-27 NOTE — Progress Notes (Signed)
PMR Admission Coordinator Pre-Admission Assessment  Patient: Ronald HoveGary Goltz is an 66 y.o., male MRN: [id number] DOB: 1951/05/27 Height:   Weight:                Insurance Information HMO: No    PPO:       PCP:       IPA:       80/20:       OTHER:   PRIMARY: Medicare part B only      Policy#: [id number]      Subscriber: Ronald HoveGary Glynn CM Name:        Phone#:       Fax#:   Pre-Cert#:        Employer:  Retired Benefits:  Phone #:       Name: Checked in Passport one source [place] Date: 12/29/14     Deduct:        Out of Pocket Max:        Life Max:   CIR:        SNF:   Outpatient:       Co-Pay:   Home Health:        Co-Pay:   DME:       Co-Pay:   Providers:    SECONDARY: Medicaid [place] access      Policy#: [id number]      Subscriber: patient CM Name:        Phone#:       Fax#:   Pre-Cert#:        Employer: Retired Benefits:  Phone #: [phone]     Name: Automated Eff. Date: Eligible 04/26/17 with coverage code [facility]     Deduct:        Out of Pocket Max:        Life Max:   CIR:        SNF:   Outpatient:       Co-Pay:   Home Health:        Co-Pay:   DME:       Co-Pay:    Emergency Contact Information Contact Information    Name Relation Home Work Mobile   Davis,[name] Sister   [phone]     Current Medical History  Patient Admitting Diagnosis:  Debility  History of Present Illness: A 66 y.o. male with history of CAD, HTN, COPD, chronic neck pain;  who was admitted on 04/11/17 with SOB and malaise due to inferior lateral STEMI, lethargy and cardiogenic shock requiring intubation in ED. History taken from chart review and sister.  He underwent cardiac cath with RCA DES and balloon angioplasty of mid LAD. He required IABP as well as neo due to hypotension. Echo showed diffuse hypokinesis with moderate pericardial effusion and EF 25-30%.  Dr. Tyrone SageGerhardt consulted due to concerns of LV rupture and TEE showed contained rupture with extensive thrombus. He was taken to OR for CABG with  resection of LV aneurysm with placement of impella 11/14 and underwent chest closure on 11/17.  Hospital course significant for E coli in 1/2 blood cultures and wound--antibiotic coverage broadened to meropenum but with upward trend in temps today. He has been weaned of Neo and treated for acute on chronic systolic CHF. He tolerated extubation on 11/26 and remains NPO due signs of weak cough and signs of dysphagia--MBS schedule for today. Therapy evaluations done yesterday revealing debility and CIR recommended due to functional deficits.    Past Medical History  Past Medical History:  Diagnosis Date  . Coronary  artery disease   . Hypertension     Family History  family history is not on file.  Prior Rehab/Hospitalizations: No previous rehab  Has the patient had major surgery during 100 days prior to admission? No  Current Medications  No current facility-administered medications for this encounter.   Current Outpatient Medications:  .  acetaminophen (TYLENOL) 160 MG/5ML solution, Place 20.3 mLs (650 mg total) into feeding tube every 6 (six) hours as needed for mild pain, moderate pain or fever (Temp > 101.0 F.)., Disp: 120 mL, Rfl: 0 .  albuterol (PROVENTIL) (2.5 MG/3ML) 0.083% nebulizer solution, Take 3 mLs (2.5 mg total) by nebulization every 3 (three) hours as needed for wheezing or shortness of breath., Disp: 75 mL, Rfl: 12 .  Amino Acids-Protein Hydrolys (FEEDING SUPPLEMENT, PRO-STAT SUGAR FREE 64,) LIQD, Place 30 mLs into feeding tube 2 (two) times daily., Disp: 900 mL, Rfl: 0 .  amiodarone (PACERONE) 200 MG tablet, Place 1 tablet (200 mg total) into feeding tube 2 (two) times daily., Disp: , Rfl:  .  [START ON 04/28/2017] aspirin 81 MG chewable tablet, Place 1 tablet (81 mg total) into feeding tube daily., Disp: , Rfl:  .  atorvastatin (LIPITOR) 80 MG tablet, Place 1 tablet (80 mg total) into feeding tube daily at 6 PM., Disp: , Rfl:  .  carvedilol (COREG) 3.125 MG tablet, Take 1  tablet (3.125 mg total) by mouth 2 (two) times daily with a meal., Disp: , Rfl:  .  guaiFENesin (ROBITUSSIN) 100 MG/5ML SOLN, Place 5 mLs (100 mg total) into feeding tube every 6 (six) hours as needed for cough or to loosen phlegm., Disp: 1200 mL, Rfl: 0 .  Hydrocortisone (GERHARDT'S BUTT CREAM) CREA, Apply 1 application topically as needed for irritation., Disp: , Rfl:  .  insulin aspart (NOVOLOG) 100 UNIT/ML injection, Inject 0-24 Units into the skin every 4 (four) hours., Disp: 10 mL, Rfl: 11 .  Maltodextrin-Xanthan Gum (RESOURCE THICKENUP CLEAR) POWD, Place 120 g into feeding tube as needed., Disp: , Rfl:  .  meropenem 1 g in sodium chloride 0.9 % 100 mL, Inject 1 g into the vein every 8 (eight) hours., Disp: , Rfl:  .  mouth rinse LIQD solution, 15 mLs by Mouth Rinse route 2 (two) times daily., Disp: , Rfl: 0 .  Nutritional Supplements (FEEDING SUPPLEMENT, VITAL HIGH PROTEIN,) LIQD liquid, Place 1,000 mLs into feeding tube continuous., Disp: , Rfl:  .  [START ON 04/28/2017] pantoprazole sodium (PROTONIX) 40 mg/20 mL PACK, Place 20 mLs (40 mg total) into feeding tube daily., Disp: 30 each, Rfl:  .  QUEtiapine (SEROQUEL) 50 MG tablet, Place 1 tablet (50 mg total) into feeding tube 2 (two) times daily., Disp: , Rfl:  .  ticagrelor (BRILINTA) 90 MG TABS tablet, Place 1 tablet (90 mg total) into feeding tube 2 (two) times daily., Disp: 60 tablet, Rfl:  .  traMADol (ULTRAM) 50 MG tablet, Take 1 tablet (50 mg total) by mouth every 6 (six) hours as needed for severe pain., Disp: 30 tablet, Rfl: 0 .  vancomycin (VANCOCIN) 1-5 GM/200ML-% SOLN, Inject 200 mLs (1,000 mg total) into the vein every 8 (eight) hours., Disp: 4000 mL, Rfl:   Facility-Administered Medications Ordered in Other Encounters:  .  0.9 %  sodium chloride infusion, 250 mL, Intravenous, Continuous, Gold, Wayne E, PA-C .  0.9 %  sodium chloride infusion, , Intravenous, Once, Kelly Splinter B, CRNA, Last Rate: 10 mL/hr at 04/26/17 0800 .   acetaminophen (TYLENOL) solution  650 mg, 650 mg, Per Tube, Q6H PRN, Roslynn Amble, MD, 650 mg at 04/26/17 1736 .  albuterol (PROVENTIL) (2.5 MG/3ML) 0.083% nebulizer solution 2.5 mg, 2.5 mg, Nebulization, Q3H PRN, Ollis, Brandi L, NP, 2.5 mg at 04/21/17 2243 .  amiodarone (PACERONE) tablet 200 mg, 200 mg, Per Tube, BID, Bensimhon, Bevelyn Buckles, MD, 200 mg at 04/27/17 1106 .  aspirin chewable tablet 81 mg, 81 mg, Per Tube, Daily, Delight Ovens, MD, 81 mg at 04/27/17 1106 .  atorvastatin (LIPITOR) tablet 80 mg, 80 mg, Per Tube, q1800, Delight Ovens, MD, 80 mg at 04/26/17 1709 .  bisacodyl (DULCOLAX) EC tablet 10 mg, 10 mg, Oral, Daily, 10 mg at 04/27/17 1106 **OR** bisacodyl (DULCOLAX) suppository 10 mg, 10 mg, Rectal, Daily, Gold, Wayne E, PA-C, 10 mg at 04/16/17 1121 .  carvedilol (COREG) tablet 3.125 mg, 3.125 mg, Oral, BID WC, Bensimhon, Bevelyn Buckles, MD, 3.125 mg at 04/27/17 1423 .  Chlorhexidine Gluconate Cloth 2 % PADS 6 each, 6 each, Topical, Daily, Delight Ovens, MD, 6 each at 04/27/17 1107 .  enoxaparin (LOVENOX) injection 40 mg, 40 mg, Subcutaneous, Q24H, Bensimhon, Bevelyn Buckles, MD, 40 mg at 04/26/17 2111 .  feeding supplement (VITAL 1.5 CAL) liquid 1,000 mL, 1,000 mL, Per Tube, Continuous, Delight Ovens, MD .  fentaNYL (SUBLIMAZE) injection 25-50 mcg, 25-50 mcg, Intravenous, Q2H PRN, Roslynn Amble, MD, 50 mcg at 04/24/17 2131 .  Gerhardt's butt cream, , Topical, PRN, Delight Ovens, MD, 1 application at 04/18/17 1437 .  guaiFENesin (ROBITUSSIN) 100 MG/5ML solution 100 mg, 5 mL, Per Tube, Q6H PRN, Joycelyn Man, Donielle M, PA-C .  insulin aspart (novoLOG) injection 0-24 Units, 0-24 Units, Subcutaneous, Q4H, Delight Ovens, MD, 2 Units at 04/26/17 1716 .  lactated ringers infusion, , Intravenous, Continuous, Gold, Glenice Laine, PA-C, Stopped at 04/23/17 1900 .  MEDLINE mouth rinse, 15 mL, Mouth Rinse, BID, Alyson Reedy, MD, 15 mL at 04/27/17 1000 .  meropenem (MERREM) 1  g in sodium chloride 0.9 % 100 mL IVPB, 1 g, Intravenous, Q8H, Bensimhon, Bevelyn Buckles, MD, Last Rate: 200 mL/hr at 04/27/17 1449, 1 g at 04/27/17 1449 .  ondansetron (ZOFRAN) injection 4 mg, 4 mg, Intravenous, Q6H PRN, Gold, Wayne E, PA-C .  pantoprazole sodium (PROTONIX) 40 mg/20 mL oral suspension 40 mg, 40 mg, Per Tube, Daily, Adline Potter, RPH, 40 mg at 04/27/17 1105 .  QUEtiapine (SEROQUEL) tablet 50 mg, 50 mg, Per Tube, BID, Roslynn Amble, MD, 50 mg at 04/27/17 1106 .  RESOURCE THICKENUP CLEAR, , Oral, PRN, Sheliah Plane B, MD .  sodium chloride flush (NS) 0.9 % injection 10-40 mL, 10-40 mL, Intracatheter, Q12H, Delight Ovens, MD, 10 mL at 04/26/17 1012 .  sodium chloride flush (NS) 0.9 % injection 10-40 mL, 10-40 mL, Intracatheter, PRN, Delight Ovens, MD .  [COMPLETED] ticagrelor [facility]) tablet 180 mg, 180 mg, Per Tube, Once, 180 mg at 04/18/17 0925 **FOLLOWED BY** ticagrelor (BRILINTA) tablet 90 mg, 90 mg, Per Tube, BID, Adline Potter, RPH, 90 mg at 04/27/17 1106 .  vancomycin (VANCOCIN) 850 mg in sodium chloride 0.9 % 250 mL IVPB, 850 mg, Intravenous, Q8H, Adline Potter, RPH  Patients Current Diet: No diet orders on file  Precautions / Restrictions     Has the patient had 2 or more falls or a fall with injury in the past year?No  Prior Activity Level    Home Assistive Devices / Equipment    Prior Device Use:  Indicate devices/aids used by the patient prior to current illness, exacerbation or injury? None  Prior Functional Level    Self Care: Did the patient need help bathing, dressing, using the toilet or eating?  Independent  Indoor Mobility: Did the patient need assistance with walking from room to room (with or without device)? Independent  Stairs: Did the patient need assistance with internal or external stairs (with or without device)? Independent  Functional Cognition: Did the patient need help planning regular tasks such as shopping or  remembering to take medications? Independent  Current Functional Level Cognition       Extremity Assessment (includes Sensation/Coordination)          ADLs       Mobility       Transfers       Ambulation / Gait / Stairs / Wheelchair Mobility       Posture / Balance      Special needs/care consideration BiPAP/CPAP No CPM No Continuous Drip IV KVO Dialysis No      Life Vest No Oxygen 02 3 L [place] Special Bed No Trach Size No Wound Vac (area) NO      Skin:  Post op sternal incision; Had plastic surgery in 2010 after a dog bite.                             Bowel mgmt: Last BM 04/26/17 with incontinence Bladder mgmt: Voiding WDL Diabetic mgmt No    Previous Home Environment Living Arrangements: Spouse/significant other(Lives with [place] significant other male)  Lives With: Significant other Available Help at Discharge: Family, Available 24 hours/day(Can go home with his sister. Vietnamese significant other works a lot.) Type of Home: Apartment Home Layout: One level Home Access: Stairs to enter [employer] of Steps: Patient's apartment with 6-8 steps back and front entry. Home Care Services: No Additional Comments: Pt with increased distraction throughout session; will need to get further home set-up, support, and PLOF details during next treatment session  Discharge Living Setting    Social/Family/Support Systems Patient Roles: Other (Comment)(Has a significant other and a sister.) Contact Information: Theodoro Doing - sister - [phone] Anticipated Caregiver: Sister Ability/Limitations of Caregiver: Significant other works a lot.  Sister is disabled, not working and can assist.  Per sister patient can discharge home with her after rehab stay. Caregiver Availability: 24/7 Discharge Plan Discussed with Primary Caregiver: Yes Is Caregiver In Agreement with Plan?: Yes Does Caregiver/Family have Issues with Lodging/Transportation while Pt is in  Rehab?: No  Goals/Additional Needs Patient/Family Goal for Rehab: PT/OT supervision to min assist goals; SLP mod I goals Expected length of stay: 18-22 days Cultural Considerations: Christian Dietary Needs: Dys 1, pudding thick liquids Equipment Needs: TBD Pt/Family Agrees to Admission and willing to participate: Yes Program Orientation Provided & Reviewed with Pt/Caregiver Including Roles  & Responsibilities: Yes  Decrease burden of Care through IP rehab admission: N/A  Possible need for SNF placement upon discharge: Not planned  Patient Condition: This patient's condition remains as documented in the consult dated 04/26/17, in which the Rehabilitation Physician determined and documented that the patient's condition is appropriate for intensive rehabilitative care in an inpatient rehabilitation facility. Will admit to inpatient rehab today.  Preadmission Screen Completed By:  Trish Mage, 04/27/2017 3:12 PM ______________________________________________________________________   Discussed status with Dr. Wynn Banker on 04/27/17 at 1302 and received telephone approval for admission today.  Admission Coordinator:  Trish Mage, time  1302/Date 04/27/17

## 2017-04-27 NOTE — Progress Notes (Signed)
OT Cancellation Note  Patient Details Name: Ronald HoveGary [name] MRN: [id number] DOB: 1950/12/11   Cancelled Treatment:    Reason Eval/Treat Not Completed: OT screened, no acute OT needs identified, as pt is transferring to CIR today.  Will defer OT eval and needs to CIR.   will sign off.  Leta Bucklin Genoaonarpe, OTR/L [phone]   [name], [name] 04/27/2017, 1:17 PM

## 2017-04-27 NOTE — Progress Notes (Signed)
  Speech Language Pathology Treatment: Dysphagia  Patient Details Name: [name] MRN: [id number] DOB: 1951/02/10 Today's Date: 04/27/2017 Time: [id number] SLP Time Calculation (min) (ACUTE ONLY): 14 min  Assessment / Plan / Recommendation Clinical Impression  Pt self fed bites applesauce with verbal cues for steadying container and spoon due to hand weakness. Timely oral transfer and no subjective indications of pharyngeal impairment. He is requesting water. Discussed possible intermittent ice chips/small sips thin water if SLP on rehab recommends (transferring today). Continue Dys 1 texture and pudding thick liquids, ST for dysphagia intervention and speech-cognitive eval on CIR.   HPI HPI: [name] is 66 year old with h/o MI, HTN, COPD, CAD admitted with chest pian found to have inferior/lateral STEMI. Intubated 11/14-11/26. Underwent angioplasty to mid lad with 25% residual stenosis. CXR stable lung volumes. Left greater than right lower lobe collapse or consolidation suspected. Stable to mildly increased pulmonary vascularity without overt edema.      SLP Plan  Continue with current plan of care       Recommendations  Diet recommendations: Pudding-thick liquid;Dysphagia 1 (puree) Liquids provided via: Teaspoon Medication Administration: Crushed with puree Supervision: Patient able to self feed;Full supervision/cueing for compensatory strategies Compensations: Slow rate;Minimize environmental distractions;Small sips/bites Postural Changes and/or Swallow Maneuvers: Seated upright 90 degrees                General recommendations: Rehab consult Oral Care Recommendations: Oral care QID Follow up Recommendations: Inpatient Rehab SLP Visit Diagnosis: Dysphagia, oropharyngeal phase (R13.12) Plan: Continue with current plan of care       GO                [name], [name] 04/27/2017, 3:30 PM Breck CoonsLisa [name] Lonell FaceLitaker M.Ed [employer]-SLP Pager [phone]

## 2017-04-28 ENCOUNTER — Inpatient Hospital Stay (HOSPITAL_COMMUNITY): Payer: Medicare Other | Admitting: Speech Pathology

## 2017-04-28 ENCOUNTER — Inpatient Hospital Stay (HOSPITAL_COMMUNITY): Payer: Medicare Other | Admitting: Physical Therapy

## 2017-04-28 ENCOUNTER — Inpatient Hospital Stay (HOSPITAL_COMMUNITY): Payer: Medicare Other | Admitting: Occupational Therapy

## 2017-04-28 DIAGNOSIS — E872 Acidosis: Secondary | ICD-10-CM | POA: Diagnosis not present

## 2017-04-28 DIAGNOSIS — G931 Anoxic brain damage, not elsewhere classified: Secondary | ICD-10-CM | POA: Diagnosis not present

## 2017-04-28 DIAGNOSIS — I2510 Atherosclerotic heart disease of native coronary artery without angina pectoris: Secondary | ICD-10-CM | POA: Diagnosis not present

## 2017-04-28 DIAGNOSIS — I238 Other current complications following acute myocardial infarction: Secondary | ICD-10-CM | POA: Diagnosis not present

## 2017-04-28 DIAGNOSIS — Z951 Presence of aortocoronary bypass graft: Secondary | ICD-10-CM | POA: Diagnosis not present

## 2017-04-28 DIAGNOSIS — D62 Acute posthemorrhagic anemia: Secondary | ICD-10-CM | POA: Diagnosis not present

## 2017-04-28 DIAGNOSIS — I253 Aneurysm of heart: Secondary | ICD-10-CM | POA: Diagnosis not present

## 2017-04-28 DIAGNOSIS — F419 Anxiety disorder, unspecified: Secondary | ICD-10-CM | POA: Diagnosis not present

## 2017-04-28 DIAGNOSIS — R7989 Other specified abnormal findings of blood chemistry: Secondary | ICD-10-CM

## 2017-04-28 DIAGNOSIS — R1310 Dysphagia, unspecified: Secondary | ICD-10-CM | POA: Diagnosis not present

## 2017-04-28 DIAGNOSIS — I480 Paroxysmal atrial fibrillation: Secondary | ICD-10-CM | POA: Diagnosis not present

## 2017-04-28 DIAGNOSIS — N390 Urinary tract infection, site not specified: Secondary | ICD-10-CM

## 2017-04-28 DIAGNOSIS — I5022 Chronic systolic (congestive) heart failure: Secondary | ICD-10-CM

## 2017-04-28 DIAGNOSIS — E876 Hypokalemia: Secondary | ICD-10-CM

## 2017-04-28 DIAGNOSIS — A499 Bacterial infection, unspecified: Secondary | ICD-10-CM

## 2017-04-28 DIAGNOSIS — R21 Rash and other nonspecific skin eruption: Secondary | ICD-10-CM | POA: Diagnosis not present

## 2017-04-28 DIAGNOSIS — F17210 Nicotine dependence, cigarettes, uncomplicated: Secondary | ICD-10-CM | POA: Diagnosis not present

## 2017-04-28 DIAGNOSIS — I2109 ST elevation (STEMI) myocardial infarction involving other coronary artery of anterior wall: Secondary | ICD-10-CM | POA: Diagnosis not present

## 2017-04-28 DIAGNOSIS — Z955 Presence of coronary angioplasty implant and graft: Secondary | ICD-10-CM | POA: Diagnosis not present

## 2017-04-28 DIAGNOSIS — Z9861 Coronary angioplasty status: Secondary | ICD-10-CM | POA: Diagnosis not present

## 2017-04-28 DIAGNOSIS — R5381 Other malaise: Secondary | ICD-10-CM | POA: Diagnosis not present

## 2017-04-28 DIAGNOSIS — J449 Chronic obstructive pulmonary disease, unspecified: Secondary | ICD-10-CM | POA: Diagnosis not present

## 2017-04-28 DIAGNOSIS — R739 Hyperglycemia, unspecified: Secondary | ICD-10-CM | POA: Diagnosis not present

## 2017-04-28 DIAGNOSIS — I110 Hypertensive heart disease with heart failure: Secondary | ICD-10-CM | POA: Diagnosis not present

## 2017-04-28 LAB — GLUCOSE, CAPILLARY
GLUCOSE-CAPILLARY: 152 mg/dL — AB (ref 65–99)
GLUCOSE-CAPILLARY: 88 mg/dL (ref 65–99)
GLUCOSE-CAPILLARY: 116 mg/dL — AB (ref 65–99)
Glucose-Capillary: 102 mg/dL — ABNORMAL HIGH (ref 65–99)

## 2017-04-28 LAB — COMPREHENSIVE METABOLIC PANEL
ALBUMIN: 2.3 g/dL — AB (ref 3.5–5.0)
ALK PHOS: 84 U/L (ref 38–126)
ALT: 51 U/L (ref 17–63)
ANION GAP: 4 — AB (ref 5–15)
AST: 43 U/L — ABNORMAL HIGH (ref 15–41)
BUN: 24 mg/dL — ABNORMAL HIGH (ref 6–20)
CALCIUM: 8.5 mg/dL — AB (ref 8.9–10.3)
CO2: 28 mmol/L (ref 22–32)
CREATININE: 0.67 mg/dL (ref 0.61–1.24)
Chloride: 106 mmol/L (ref 101–111)
GFR calc Af Amer: >60 mL/min (ref >60)
GFR calc non Af Amer: >60 mL/min (ref >60)
GLUCOSE: 106 mg/dL — AB (ref 65–99)
Potassium: 4.1 mmol/L (ref 3.5–5.1)
SODIUM: 138 mmol/L (ref 135–145)
Total Bilirubin: 0.7 mg/dL (ref 0.3–1.2)
Total Protein: 6 g/dL — ABNORMAL LOW (ref 6.5–8.1)

## 2017-04-28 LAB — CBC WITH DIFFERENTIAL/PLATELET
BASOS ABS: 0.1 10*3/uL (ref 0.0–0.1)
BASOS PCT: 1 %
EOS ABS: 0.4 10*3/uL (ref 0.0–0.7)
Eosinophils Relative: 3 %
HCT: 29.7 % — ABNORMAL LOW (ref 39.0–52.0)
HEMOGLOBIN: 9.1 g/dL — AB (ref 13.0–17.0)
Lymphocytes Relative: 7 %
Lymphs Abs: 1.1 10*3/uL (ref 0.7–4.0)
MCH: 27 pg (ref 26.0–34.0)
MCHC: 30.6 g/dL (ref 30.0–36.0)
MCV: 88.1 fL (ref 78.0–100.0)
Monocytes Absolute: 1.3 10*3/uL — ABNORMAL HIGH (ref 0.1–1.0)
Monocytes Relative: 9 %
NEUTROS PCT: 80 %
Neutro Abs: 12.2 10*3/uL — ABNORMAL HIGH (ref 1.7–7.7)
Platelets: 640 10*3/uL — ABNORMAL HIGH (ref 150–400)
RBC: 3.37 MIL/uL — AB (ref 4.22–5.81)
RDW: 15.1 % (ref 11.5–15.5)
WBC: 15 10*3/uL — AB (ref 4.0–10.5)

## 2017-04-28 LAB — COOXEMETRY PANEL
CARBOXYHEMOGLOBIN: 0.9 % (ref 0.5–1.5)
Methemoglobin: 0.9 % (ref 0.0–1.5)
O2 Saturation: 60.7 %
Total hemoglobin: 12.3 g/dL (ref 12.0–16.0)

## 2017-04-28 LAB — MAGNESIUM: Magnesium: 2.3 mg/dL (ref 1.7–2.4)

## 2017-04-28 MED ORDER — PRO-STAT SUGAR FREE PO LIQD
30.0000 mL | Freq: Two times a day (BID) | ORAL | Status: DC
  Administered 2017-04-28 – 2017-05-11 (×26): 30 mL
  Filled 2017-04-28 (×26): qty 30

## 2017-04-28 MED ORDER — VITAL 1.5 CAL PO LIQD
1000.0000 mL | ORAL | Status: DC
  Administered 2017-04-28 – 2017-05-04 (×5): 1000 mL
  Filled 2017-04-28 (×16): qty 1000

## 2017-04-28 MED ORDER — LOSARTAN POTASSIUM 50 MG PO TABS
25.0000 mg | ORAL_TABLET | Freq: Every day | ORAL | Status: DC
  Administered 2017-04-28 – 2017-04-30 (×3): 25 mg via ORAL
  Filled 2017-04-28 (×3): qty 1

## 2017-04-28 NOTE — Progress Notes (Signed)
Initial Nutrition Assessment  DOCUMENTATION CODES:   Obesity unspecified  INTERVENTION:  Nocturnal tube feeds using Vital 1.5 formula at new goal rate of 80 ml/hr x 10 hours (6pm-4am) with 30 ml Prostat BID per tube to provide 1400 kcal (70% of kcal needs), 82 grams of protein (82% of protein needs), 608 ml of water.   Continue free water flushes of 200 ml q 6 hours. Total water: 1408 ml/day.  Provide Magic cup TID with meals, each supplement provides 290 kcal and 9 grams of protein.  72 hour calorie count initiated--- RD to follow up Monday (11/26) with full results.   NUTRITION DIAGNOSIS:   Inadequate oral intake related to   as evidenced by meal completion < 50%.  GOAL:   Patient will meet greater than or equal to 90% of their needs  MONITOR:   TF tolerance, PO intake, Supplement acceptance, Diet advancement, Weight trends, Labs, Skin, I & O's  REASON FOR ASSESSMENT:   Consult Calorie Count, Enteral/tube feeding initiation and management  ASSESSMENT:    66 y.o. male with history of CAD, HTN, COPD, chronic neck pain;  who was admitted on 04/11/17 with SOB and malaise due to inferior lateral STEMI, lethargy and cardiogenic shock. He was taken to OR for CABG with resection of LV aneurysm with placement of impella 11/14 and underwent chest closure on 11/17.  11/19 Impella removed  Pt is currently on a dysphagia 1 diet with pudding thick liquids. Meal completion has been 30-50%. Pt also has Cortrak NGT in place with nocturnal tube feeds. RD to modify tube feedings to better meet nutrition needs as pt PO intake inadequate at meals. New orders to provide in 70% of kcal needs and 82% of protein needs. Pt has been consuming his Magic cups at meals. Calorie count has been ordered by PA. RD to follow up Monday with full calorie count results.  Labs and medications reviewed.   NUTRITION - FOCUSED PHYSICAL EXAM:    Most Recent Value  Orbital Region  No depletion  Upper Arm Region   No depletion  Thoracic and Lumbar Region  No depletion  Buccal Region  No depletion  Temple Region  No depletion  Clavicle Bone Region  No depletion  Clavicle and Acromion Bone Region  No depletion  Scapular Bone Region  No depletion  Dorsal Hand  No depletion  Patellar Region  No depletion  Anterior Thigh Region  No depletion  Posterior Calf Region  No depletion  Edema (RD Assessment)  Mild  Hair  Reviewed  Eyes  Reviewed  Mouth  Reviewed  Skin  Reviewed  Nails  Reviewed       Diet Order:  DIET - DYS 1 Room service appropriate? Yes; Fluid consistency: Pudding Thick  EDUCATION NEEDS:   Not appropriate for education at this time  Skin:  Skin Assessment: Reviewed RN Assessment  Last BM:  11/29  Height:   Ht Readings from Last 1 Encounters:  04/13/17 5\' 7"  (1.702 m)    Weight:   Wt Readings from Last 1 Encounters:  04/28/17 194 lb 3.6 oz (88.1 kg)    Ideal Body Weight:  67.27 kg  BMI:  Body mass index is 30.42 kg/m.  Estimated Nutritional Needs:   Kcal:  2000-2200  Protein:  100-115  Fluid:  >/= 2 L/day    Roslyn SmilingStephanie Justyne Roell, MS, RD, LDN Pager # [phone] After hours/ weekend pager # [phone]

## 2017-04-28 NOTE — Progress Notes (Signed)
Advanced Heart Failure Rounding Note  Subjective:    - S/P LV pseudoaneurysm repeat 11/14 Impella 5.0 Placed.  - Chest closed 11/16.  - Started on broad spectrum abx on 11/17 for fever and leukocytosis.  - Impella noted to have poor waveform am of 11/19. Thought to have possible clot.  Heparin restarted and pump pulled afternoon of 04/17/17. - Extubated 11/26. - Sent to CIR 04/27/17  Feeling OK this am. Had to stop first two PTs earlier as he was tired and somewhat overwhelmed. Says "I can't learn it all in one day".  Creatinine stable. K improved.  WBC trending 17.1 -> 15.0  Objective:   Weight Range: 194 lb 3.6 oz (88.1 kg) Body mass index is 30.42 kg/m.   Vital Signs:   Temp:  [97.6 F (36.4 C)-98.1 F (36.7 C)] 97.9 F (36.6 C) (11/30 0400) Pulse Rate:  [72-90] 83 (11/30 0400) Resp:  [30-40] 40 (11/30 0400) BP: (115-143)/(52-62) 143/59 (11/30 0400) SpO2:  [97 %] 97 % (11/30 0400) FiO2 (%):  [3 %] 3 % (11/29 2100) Weight:  [194 lb 3.6 oz (88.1 kg)] 194 lb 3.6 oz (88.1 kg) (11/30 0400) Last BM Date: 04/27/17  Weight change: Filed Weights   04/28/17 0400  Weight: 194 lb 3.6 oz (88.1 kg)    Intake/Output:   Intake/Output Summary (Last 24 hours) at 04/28/2017 1147 Last data filed at 04/28/2017 0756 Gross per 24 hour  Intake 870 ml  Output 950 ml  Net -80 ml      Physical Exam    General:  NAD.  HEENT: Normal x for cortrak Neck: Supple. JVP . Carotids 2+ bilat; no bruits. No lymphadenopathy or thyromegaly appreciated. Cor: PMI nondisplaced. Regular rate & rhythm. No rubs, gallops or murmurs. Lungs: Clear Abdomen: Soft, nontender, nondistended. No hepatosplenomegaly. No bruits or masses. Good bowel sounds. Extremities: No cyanosis, clubbing, rash, or edema Neuro: Alert & orientedx3, cranial nerves grossly intact. moves all 4 extremities w/o difficulty. Affect pleasant   Telemetry   Not connected  EKG    N/A  Labs    CBC Recent Labs   04/27/17 0411 04/28/17 0444  WBC 17.1* 15.0*  NEUTROABS  --  12.2*  HGB 9.4* 9.1*  HCT 29.7* 29.7*  MCV 86.6 88.1  PLT 643* 640*   Basic Metabolic Panel Recent Labs    54/01/8110/28/18 0945 04/27/17 0411 04/28/17 0444  NA 135 139 138  K 4.0 3.5 4.1  CL 104 106 106  CO2 26 28 28   GLUCOSE 131* 112* 106*  BUN 29* 29* 24*  CREATININE 0.74 0.73 0.67  CALCIUM 8.7* 8.3* 8.5*  MG 2.2 2.4 2.3  PHOS 2.5 2.8  --    Liver Function Tests Recent Labs    04/27/17 0411 04/28/17 0444  AST  --  43*  ALT  --  51  ALKPHOS  --  84  BILITOT  --  0.7  PROT  --  6.0*  ALBUMIN 2.2* 2.3*   No results for input(s): LIPASE, AMYLASE in the last 72 hours. Cardiac Enzymes No results for input(s): CKTOTAL, CKMB, CKMBINDEX, TROPONINI in the last 72 hours.  BNP: BNP (last 3 results) No results for input(s): BNP in the last 8760 hours.  ProBNP (last 3 results) No results for input(s): PROBNP in the last 8760 hours.   D-Dimer No results for input(s): DDIMER in the last 72 hours. Hemoglobin A1C No results for input(s): HGBA1C in the last 72 hours. Fasting Lipid Panel No results for  input(s): CHOL, HDL, LDLCALC, TRIG, CHOLHDL, LDLDIRECT in the last 72 hours. Thyroid Function Tests No results for input(s): TSH, T4TOTAL, T3FREE, THYROIDAB in the last 72 hours.  Invalid input(s): FREET3  Other results:   Imaging     No results found.   Medications:     Scheduled Medications: . amiodarone  200 mg Per Tube BID  . aspirin  81 mg Per Tube Daily  . atorvastatin  80 mg Per Tube q1800  . carvedilol  3.125 mg Oral BID WC  . Chlorhexidine Gluconate Cloth  6 each Topical Daily  . enoxaparin (LOVENOX) injection  40 mg Subcutaneous Q24H  . free water  200 mL Per Tube Q6H  . insulin aspart  0-24 Units Subcutaneous Q4H  . mouth rinse  15 mL Mouth Rinse BID  . pantoprazole sodium  40 mg Per Tube Daily  . potassium chloride  20 mEq Per Tube Daily  . QUEtiapine  50 mg Per Tube BID  . sennosides   10 mL Oral QHS  . sodium chloride flush  10-40 mL Intracatheter Q12H  . ticagrelor  90 mg Per Tube BID     Infusions: . feeding supplement (VITAL 1.5 CAL) 1,000 mL (04/28/17 0400)  . meropenem (MERREM) IV Stopped (04/28/17 [id number])  . vancomycin Stopped (04/28/17 1000)     PRN Medications:  acetaminophen, albuterol, alum & mag hydroxide-simeth, bisacodyl, diphenhydrAMINE, Gerhardt's butt cream, guaiFENesin-dextromethorphan, prochlorperazine **OR** prochlorperazine **OR** prochlorperazine, RESOURCE THICKENUP CLEAR, sodium chloride flush, sodium phosphate, traZODone    Patient Profile   [name] is 66 year old with h/o MI, HTN, COPD admitted with chest pain. EKG with evidence of inferior/lateral STEMI. Due to lethargy and hypoxemia he was intubated in the ED. Taken to cath lab with RCA DES and POBA to chronically occluded LADwith IABP placed.  Had LV pseudo aneurysm repair 11/14 .   Assessment/Plan   1. CAD with OOH anterior MI c/b LV pseudoaneurysm and cardiogenic shock - attempted PCI of LAD failed due to chronic occlusion and extensive infarct - s/p PCI/DES to distal RCA Remains on ASA, brilinta, atorvastatin - No s/s of ischemia.    2.  S/P LV pseudoaneurysm repair 11/14 3. Chronic systolic HF-> cardiogenic shock resolved - EF improved 25%->40% - Volume status stable on exam.  - Not on lasix.  - Add losartan 25 mg qhs and follow. BP in 120-140s. - Continue coreg 3.125 mg BID 4. COPD with chronic CO2 retention  - Stable. No wheeze.   5. ESBL E coli culture -04/15/17 ESBL from skin culture (penile meatus) - Continue vanc x 4 days from stopping meropenem will finish days of vanc.  WBC coming down   - Tmax 98.3  6. PAF - remains in NSR on amiodarone 200 mg BID.   7. Hypokalemia - K 4.1 this am. Continue to follow.   8. Severe Deconditioning Admitted to CIR 04/27/17. Appreciate your management of this patient.   Medication concerns reviewed with patient and  pharmacy team. Barriers identified: None at this time.   Length of Stay: 1  Ronald SchoolMichael Andrew Tillery, PA-C  04/28/2017, 11:47 AM  Advanced Heart Failure Team Pager [phone] (M-F; 7a - 4p)  Please contact [facility] for night-coverage after hours (4p -7a ) and weekends on [email or web address]  Patient seen and examined with the above-signed Advanced Practice Provider and/or Housestaff. I personally reviewed laboratory data, imaging studies and relevant notes. I independently examined the patient and formulated the important aspects of the plan. I have edited  the note to reflect any of my changes or salient points. I have personally discussed the plan with the patient and/or family.  Improving slowly with CIR. I still have some concerns over mild anoxic injury. We will see how he progresses. Agree with adding losartan. Will finish vanc soon. Will likely need to add back lasix soon but weight stable so far. Hopefully can get Cor-Trak out soon.   HF team will see again on Monday.   Arvilla Meres, MD  7:48 PM

## 2017-04-28 NOTE — Evaluation (Addendum)
Speech Language Pathology Assessment and Plan  Patient Details  Name: [name] MRN: [id number] Date of Birth: [birth year]  SLP Diagnosis: Dysphagia;Cognitive Impairments  Rehab Potential: Good ELOS: 21 days     Today's Date: 04/28/2017 SLP Individual Time: 8891-6945 SLP Individual Time Calculation (min): 57 min   Problem List:  Patient Active Problem List   Diagnosis Date Noted  . Chronic systolic (congestive) heart failure ([place])   . CAD in native artery   . Physical debility 04/27/2017  . 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 ([place])   . Acute respiratory failure with hypoxia ([place])   . Hypoxemia   . Congestive heart failure (CHF) ([place])   . Dysphagia   . Bacteremia   . Benign essential HTN   . Chronic neck pain   . Sepsis ([place])   . Acute blood loss anemia   . Tachypnea   . Leukocytosis   . Fever   . Cardiogenic shock ([place])   . Aneurysm of left ventricle of heart 04/12/2017  . Acute anterior wall MI ([place])   . Acute pulmonary edema ([facility])   . Acute hypoxemic respiratory failure ([place])   . ACS (acute coronary syndrome) ([place])   . Acute heart failure ([place])   . Encounter for central line placement   . Encounter for management of intra-aortic balloon pump   . STEMI (ST elevation myocardial infarction) ([place]) 04/11/2017  . COPD (chronic obstructive pulmonary disease) ([place]) 04/11/2017  . CHF (congestive heart failure), NYHA class II, acute, combined ([place]) 04/11/2017   Past Medical History:  Past Medical History:  Diagnosis Date  . Coronary artery disease   . Hypertension    Past Surgical History:  Past Surgical History:  Procedure Laterality Date  . APPLICATION OF WOUND VAC N/A 04/14/2017   Procedure: APPLICATION OF WOUND VAC with reposition of Impella device;  Surgeon: Grace Isaac, MD;  Location: [facility];  Service: Thoracic;  Laterality: N/A;  . CORONARY STENT INTERVENTION N/A 04/11/2017   Procedure: CORONARY STENT INTERVENTION;   Surgeon: Jettie Booze, MD;  Location: [facility];  Service: Cardiovascular;  Laterality: N/A;  . CORONARY THROMBECTOMY N/A 04/11/2017   Procedure: Coronary Thrombectomy;  Surgeon: Jettie Booze, MD;  Location: [facility];  Service: Cardiovascular;  Laterality: N/A;  . CORONARY/GRAFT ACUTE MI REVASCULARIZATION N/A 04/11/2017   Procedure: Coronary/Graft Acute MI Revascularization;  Surgeon: Jettie Booze, MD;  Location: [place] [facility];  Service: Cardiovascular;  Laterality: N/A;  . IABP INSERTION N/A 04/11/2017   Procedure: IABP Insertion;  Surgeon: Jettie Booze, MD;  Location: [place];  Service: Cardiovascular;  Laterality: N/A;  . LEFT HEART CATH AND CORONARY ANGIOGRAPHY N/A 04/11/2017   Procedure: LEFT HEART CATH AND CORONARY ANGIOGRAPHY;  Surgeon: Jettie Booze, MD;  Location: [place];  Service: Cardiovascular;  Laterality: N/A;  . PLACEMENT OF IMPELLA LEFT VENTRICULAR ASSIST DEVICE  04/12/2017   Procedure: PLACEMENT OF IMPELLA  LD;  Surgeon: Grace Isaac, MD;  Location: [facility];  Service: Open Heart Surgery;;  . REMOVAL OF IMPELLA LEFT VENTRICULAR ASSIST DEVICE N/A 04/17/2017   Procedure: REMOVAL OF IMPELLA LEFT VENTRICULAR ASSIST DEVICE;  Surgeon: Grace Isaac, MD;  Location: [facility];  Service: Open Heart Surgery;  Laterality: N/A;  . REPAIR OF RIGHT VENTRICLE LACERATION Left 04/12/2017   Procedure: Resection of LV Aneurysm;  Surgeon: Grace Isaac, MD;  Location: [facility];  Service: Open Heart Surgery;  Laterality:  Left;  . STERNAL CLOSURE N/A 04/14/2017   Procedure: STERNAL CLOSURE;  Surgeon: Grace Isaac, MD;  Location: [facility];  Service: Thoracic;  Laterality: N/A;  . TEE WITHOUT CARDIOVERSION N/A 04/14/2017   Procedure: TRANSESOPHAGEAL ECHOCARDIOGRAM (TEE);  Surgeon: Grace Isaac, MD;  Location: [facility];  Service: Thoracic;  Laterality: N/A;  . ULTRASOUND GUIDANCE FOR VASCULAR ACCESS   04/11/2017   Procedure: Ultrasound Guidance For Vascular Access;  Surgeon: Jettie Booze, MD;  Location: [facility];  Service: Cardiovascular;;    Assessment / Plan / Recommendation Clinical Impression   [name] is a 66 y.o. male with history of CAD, HTN, COPD, chronic neck pain;  who was admitted on 04/11/17 with SOB and malaise due to inferior lateral STEMI, lethargy and cardiogenic shock requiring intubation in ED.   He underwent cardiac cath with RCA DES and balloon angioplasty of mid LAD. He required IABP as well as neo due to hypotension. Echo showed diffuse hypokinesis with moderate pericardial effusion and EF 25-30%.  Dr. Servando Snare consulted due to concerns of LV rupture and TEE showed contained rupture with extensive thrombus. He was taken to OR for CABG with resection of LV aneurysm with placement of impella 11/14 and underwent chest closure on 11/17.  Hospital course significant for E coli in 1/2 blood cultures and wound--antibiotic coverage broadened to meropenum but with upward trend in temps therefore Vancomycin added on 11/27.  He has been weaned of Neo and treated for acute on chronic systolic CHF.  He tolerated extubation on 11/26 and MBS done 11/28 to evaluate swallow. He was started on pudding/puree diet and foley d/c yesterday.  Therapy evaluations done yesterday revealing debility and CIR recommended due to functional deficits.  SLP evaluation completed on 04/28/2017 with the following results:   Subjectively, pt presents with mildly decreased vocal intensity and hoarseness likely due to prolonged intubation.  Pt demonstrates good toleration of pureed textures and trials of ice chips as evidenced by clear vocal quality immediately following POs and no coughing or throat clearing.  SLP is hopeful that pt's diet will be able to be advanced quickly given improvements noted at bedside in comparison to reports from acute SLP.   Pt also presents with moderate cognitive  deficits characterized by decreased inhibition, inattention to tasks, decreased safety awareness with impulsivity, and decreased intellectual awareness of his deficits.   As a result, pt would benefit from skilled ST while inpatient in order to maximize functional independence and reduce burden of care prior to discharge.  Anticipate that pt will need 24/7 supervision at discharge in addition to [place] follow up at next level of care.     Skilled Therapeutic Interventions          Cognitive-linguistic evaluation completed with results and recommendations reviewed with patient.  Pt needed min assist verbal cues for use of swallowing precautions when consuming trials of ice chips as well as presentations of pureed textures due to slightly decreased attention to boluses.  Pt needed mod-max assist verbal cues for topic maintenance during conversations with therapist as well as for safety awareness due to impulsivity.  He seemed to have little awareness of his current cognitive limitations and evaded questions regarding his obvious confusion and inattention.  Pt was also labile and often vascilated between bouts of frustration with staff members and philosophizing over the reason of his current hospital course.  Pt was left in bed with bed alarm set and call bell within reach.  SLP Assessment  Patient will need skilled Speech Lanaguage Pathology Services during CIR admission    Recommendations  SLP Diet Recommendations: Dysphagia 1 (Puree);Pudding Liquid Administration via: Spoon Medication Administration: Crushed with puree Supervision: Patient able to self feed;Full supervision/cueing for compensatory strategies Compensations: Slow rate;Minimize environmental distractions;Small sips/bites Postural Changes and/or Swallow Maneuvers: Seated upright 90 degrees Oral Care Recommendations: Oral care QID Recommendations for Other Services: Neuropsych consult Patient destination: Home Follow up  Recommendations: 24 hour supervision/assistance;Home Health SLP;Outpatient SLP Equipment Recommended: To be determined    SLP Frequency 3 to 5 out of 7 days   SLP Duration  SLP Intensity  SLP Treatment/Interventions 21 days   Minumum of 1-2 x/day, 30 to 90 minutes  Cognitive remediation/compensation;Dysphagia/aspiration precaution training;Internal/external aids;Cueing hierarchy;Environmental controls;Functional tasks;Patient/family education    Pain Pain Assessment Pain Assessment: No/denies pain  Prior Functioning Cognitive/Linguistic Baseline: Within functional limits Type of Home: Apartment  Lives With: Significant other Available Help at Discharge: Family;Available 24 hours/day Vocation: Retired  Function:  Eating Eating   Modified Consistency Diet: Yes Eating Assist Level: Supervision or verbal cues;Set up assist for   Eating Set Up Assist For: Opening containers       Cognition Comprehension Comprehension assist level: Understands basic 90% of the time/cues < 10% of the time  Expression   Expression assist level: Expresses basic 75 - 89% of the time/requires cueing 10 - 24% of the time. Needs helper to occlude trach/needs to repeat words.  Social Interaction Social Interaction assist level: Interacts appropriately 50 - 74% of the time - May be physically or verbally inappropriate.  Problem Solving Problem solving assist level: Solves basic 50 - 74% of the time/requires cueing 25 - 49% of the time  Memory Memory assist level: Recognizes or recalls 50 - 74% of the time/requires cueing 25 - 49% of the time   Short Term Goals: Week 1: SLP Short Term Goal 1 (Week 1): Pt will consume therapeutic trials of ice chips with minimal overt s/s of aspiration over 3 consecutive sessions prior to repeat objective assessment.   SLP Short Term Goal 2 (Week 1): Pt will sustain his attention to functional tasks for 5 minutes with min verbal cues cor redirection.   SLP Short Term  Goal 3 (Week 1): Pt will maintain a topic of conversation for at least 5 turns with min verbal cues.    SLP Short Term Goal 4 (Week 1): Pt will complete basic, functional tasks with min verbal cues for problem solving.   SLP Short Term Goal 5 (Week 1): Pt will consume dys 1 textures and pudding thick liquids with supervision cues for use of swallowing precautions.   SLP Short Term Goal 6 (Week 1): Pt will return demonstration of at least 2 safety precautions with mod assist verbal cues.    Refer to Care Plan for Long Term Goals  Recommendations for other services: Neuropsych  Discharge Criteria: Patient will be discharged from SLP if patient refuses treatment 3 consecutive times without medical reason, if treatment goals not met, if there is a change in medical status, if patient makes no progress towards goals or if patient is discharged from hospital.  The above assessment, treatment plan, treatment alternatives and goals were discussed and mutually agreed upon: by patient  Emilio Math 04/28/2017, 8:28 PM

## 2017-04-28 NOTE — Progress Notes (Signed)
[facility] PHYSICAL MEDICINE & REHABILITATION     PROGRESS NOTE    Subjective/Complaints:   Objective: Vital Signs: Blood pressure (!) 143/59, pulse 83, temperature 97.9 F (36.6 C), temperature source Oral, resp. rate (!) 40, weight 88.1 kg (194 lb 3.6 oz), SpO2 97 %. Dg Chest Port 1 View  Result Date: 04/27/2017 CLINICAL DATA:  Respiratory failure, shortness of Breath EXAM: PORTABLE CHEST 1 VIEW COMPARISON:  04/26/2017 FINDINGS: Cardiomegaly with vascular congestion. Small left pleural effusion, stable. Left lower lobe atelectasis or consolidation, unchanged. No focal opacity on the right. Right PICC line is unchanged. IMPRESSION: Cardiomegaly with vascular congestion. Left lower lobe opacity and small left effusion, stable. Electronically Signed   By: Charlett NoseKevin  Dover M.D.   On: 04/27/2017 08:36   Recent Labs    04/27/17 0411 04/28/17 0444  WBC 17.1* 15.0*  HGB 9.4* 9.1*  HCT 29.7* 29.7*  PLT 643* 640*   Recent Labs    04/27/17 0411 04/28/17 0444  NA 139 138  K 3.5 4.1  CL 106 106  GLUCOSE 112* 106*  BUN 29* 24*  CREATININE 0.73 0.67  CALCIUM 8.3* 8.5*   CBG (last 3)  Recent Labs    04/27/17 0820 04/27/17 1223 04/28/17 0900  GLUCAP 101* 100* 152*    Wt Readings from Last 3 Encounters:  04/28/17 88.1 kg (194 lb 3.6 oz)  04/27/17 88.3 kg (194 lb 10.7 oz)    Physical Exam:  Constitutional: He appearswell-developedand well-nourished.No distress. Cortack in place. HENT:  Head:Normocephalicand atraumatic.  Mouth/Throat:Oropharynx is clear and moist.  Eyes:Conjunctivaeand EOMare normal. Pupils are equal, round, and reactive to light.  Neck:Normal range of motion.Neck supple.  Cardiovascular:Normal rate with systolic murmur, no JVD Respiratory:Lungs generally clear with decreased air movement overall.  He has nasal cannula in place with 4 L of oxygen NW:GNFAGI:Soft.Bowel sounds are normal. He exhibitsno distension. There isno tenderness.   Musculoskeletal: He exhibitsedema(miinimal edema bilateral feet).  Neurological: He isalert.   Patient is oriented to person and hospital but remains distracted.  He is frequently tangential and goes off topic.  He can follow simple commands.  Strength is grossly 4- out of 5 proximal distal in the upper extremities and 3+ to 4 out of 5 proximal distal in the   lower extremities.  Sensation grossly intact.  Cranial nerve exam is nonfocal  Skin iswarmand dry. He isnot diaphoretic.   Sternal incision is intact.  Mild bruising from a previous Lovenox injection notable Psychiatric: Pleasant but slightly confused   Assessment/Plan: 1.  Functional deficits and decreased mobility secondary to debility after multiple medical complications which require 3+ hours per day of interdisciplinary therapy in a comprehensive inpatient rehab setting. Physiatrist is providing close team supervision and 24 hour management of active medical problems listed below. Physiatrist and rehab team continue to assess barriers to discharge/monitor patient progress toward functional and medical goals.  Function:  Bathing Bathing position      Bathing parts      Bathing assist        Upper Body Dressing/Undressing Upper body dressing                    Upper body assist        Lower Body Dressing/Undressing Lower body dressing                                  Lower body assist  Toileting Toileting          Toileting assist     Transfers Chair/bed Company secretarytransfer             Locomotion Ambulation           Wheelchair          Cognition Comprehension    Expression    Social Interaction    Problem Solving    Memory     Medical Problem List and Plan: 1.   Debility secondary to coronary artery disease and left ventricular rupture with acute systolic heart failure   - beginning therapies today  2. DVT Prophylaxis/Anticoagulation:  Pharmaceutical:Lovenox 3. Pain Management: Use acetaminophen as needed 4. Mood:[name] to follow for evaluation and support. 5. Neuropsych: This patientis not fullycapable of making decisions on hisown behalf. 6. Skin/Wound Care:Monitor surgical wounds daily for healing .\ 7. Fluids/Electrolytes/Nutrition:Monitor I/O. Added water flushes qid. Changed tube feeds to evening to assist with appetite. Does not like pureed foods.   -Labs all reviewed today.  BUN showing some improvement.  Continue current flushes and tube feeds   -Recheck labs on Monday 8. ESBL E coli (penile meatus): On meropenem (day 10/10) and Vancomycin day# 4---?duration   -afebrile 9. Hypokalemia: Being Managed with runs of K+--will add supplement via PEG. 10.Anoxic BI? /Delirium: Continue Seroquel and wean over next few days.    -Establish better sleep wake cycle 11. Leukocytosis: Continue to monitor for trends -20.0 -->22.1-->17.1>>15. Has been afebrile almost 48 hours 12. PAF: In NSR on amiodarone 200 mg bid and coreg addedon11/29 13. COPD with chronic hypoxic/hypercarbic failure:Requiring 4 L oxygen per [place]--wean as able. Encouraging IS 14. Acute systolic heart failure due to cardiogenic shock: EF improved to 40%. No lasix. Monitor weight for signs of overload. Check weights daily.  15. Hypokalemia: K+ 4.1 today.  Continue twice daily supplementation and recheck labs on Monday. 16. ABLA: Monitor H/H serially.  9.1 today   LOS (Days) 1 A FACE TO FACE EVALUATION WAS PERFORMED  Faith RogueSWARTZ,Mavric Cortright T, MD 04/28/2017 10:04 AM

## 2017-04-28 NOTE — Evaluation (Signed)
Physical Therapy Assessment and Plan  Patient Details  Name: [name] MRN: [id number] Date of Birth: 1951-04-12  PT Diagnosis: Abnormality of gait, Cognitive deficits, Difficulty walking and Muscle weakness Rehab Potential: Good ELOS: 21-24 days   Today's Date: 04/28/2017 PT Individual Time: 1330-1440 PT Individual Time Calculation (min): 70 min    Problem List:  Patient Active Problem List   Diagnosis Date Noted  . Physical debility 04/27/2017  . 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 ([place])   . Acute respiratory failure with hypoxia ([place])   . Hypoxemia   . Congestive heart failure (CHF) ([place])   . Dysphagia   . Bacteremia   . Benign essential HTN   . Chronic neck pain   . Sepsis ([place])   . Acute blood loss anemia   . Tachypnea   . Leukocytosis   . Fever   . Cardiogenic shock ([place])   . Aneurysm of left ventricle of heart 04/12/2017  . Acute anterior wall MI ([place])   . Acute pulmonary edema ([facility])   . Acute hypoxemic respiratory failure ([place])   . ACS (acute coronary syndrome) ([place])   . Acute heart failure ([place])   . Encounter for central line placement   . Encounter for management of intra-aortic balloon pump   . STEMI (ST elevation myocardial infarction) ([place]) 04/11/2017  . COPD (chronic obstructive pulmonary disease) ([place]) 04/11/2017  . CHF (congestive heart failure), NYHA class II, acute, combined ([place]) 04/11/2017    Past Medical History:  Past Medical History:  Diagnosis Date  . Coronary artery disease   . Hypertension    Past Surgical History:  Past Surgical History:  Procedure Laterality Date  . APPLICATION OF WOUND VAC N/A 04/14/2017   Procedure: APPLICATION OF WOUND VAC with reposition of Impella device;  Surgeon: Grace Isaac, MD;  Location: [facility];  Service: Thoracic;  Laterality: N/A;  . CORONARY STENT INTERVENTION N/A 04/11/2017   Procedure: CORONARY STENT INTERVENTION;  Surgeon: Jettie Booze, MD;  Location:  [facility];  Service: Cardiovascular;  Laterality: N/A;  . CORONARY THROMBECTOMY N/A 04/11/2017   Procedure: Coronary Thrombectomy;  Surgeon: Jettie Booze, MD;  Location: [facility];  Service: Cardiovascular;  Laterality: N/A;  . CORONARY/GRAFT ACUTE MI REVASCULARIZATION N/A 04/11/2017   Procedure: Coronary/Graft Acute MI Revascularization;  Surgeon: Jettie Booze, MD;  Location: [facility];  Service: Cardiovascular;  Laterality: N/A;  . IABP INSERTION N/A 04/11/2017   Procedure: IABP Insertion;  Surgeon: Jettie Booze, MD;  Location: [facility];  Service: Cardiovascular;  Laterality: N/A;  . LEFT HEART CATH AND CORONARY ANGIOGRAPHY N/A 04/11/2017   Procedure: LEFT HEART CATH AND CORONARY ANGIOGRAPHY;  Surgeon: Jettie Booze, MD;  Location: [facility];  Service: Cardiovascular;  Laterality: N/A;  . PLACEMENT OF IMPELLA LEFT VENTRICULAR ASSIST DEVICE  04/12/2017   Procedure: PLACEMENT OF IMPELLA  LD;  Surgeon: Grace Isaac, MD;  Location: [facility];  Service: Open Heart Surgery;;  . REMOVAL OF IMPELLA LEFT VENTRICULAR ASSIST DEVICE N/A 04/17/2017   Procedure: REMOVAL OF IMPELLA LEFT VENTRICULAR ASSIST DEVICE;  Surgeon: Grace Isaac, MD;  Location: [facility];  Service: Open Heart Surgery;  Laterality: N/A;  . REPAIR OF RIGHT VENTRICLE LACERATION Left 04/12/2017   Procedure: Resection of LV Aneurysm;  Surgeon: Grace Isaac, MD;  Location: [facility];  Service: Open Heart Surgery;  Laterality: Left;  . STERNAL CLOSURE N/A 04/14/2017   Procedure:  STERNAL CLOSURE;  Surgeon: Grace Isaac, MD;  Location: [facility];  Service: Thoracic;  Laterality: N/A;  . TEE WITHOUT CARDIOVERSION N/A 04/14/2017   Procedure: TRANSESOPHAGEAL ECHOCARDIOGRAM (TEE);  Surgeon: Grace Isaac, MD;  [facility];  Service: Thoracic;  Laterality: N/A;  . ULTRASOUND GUIDANCE FOR VASCULAR ACCESS  04/11/2017   Procedure: Ultrasound Guidance For  Vascular Access;  Surgeon: Jettie Booze, MD;  Location: [facility];  Service: Cardiovascular;;    Assessment & Plan Clinical Impression: Patient is a 66 y.o. year old male with recent admission to the hospital on 04/11/17 with SOB and malaise due to inferior lateral STEMI, lethargy and cardiogenic shock requiring intubation in ED. History taken from chart review and sister.He underwent cardiac cath with RCA DES and balloon angioplasty of mid LAD. He required IABP as well as neo due to hypotension. Echo showed diffuse hypokinesis with moderate pericardial effusion and EF 25-30%. Dr. Servando Snare consulted due to concerns of LV rupture and TEE showed contained rupture with extensive thrombus. He was taken to OR for CABG with resection of LV aneurysm with placement of impella 11/14 and underwent chest closure on 11/17. Hospital course significant for E coli in 1/2 blood cultures and wound--antibiotic coverage broadened to meropenum but with upward trend in tempstherefore Vancomycin added on 11/27.  He has been weaned of Neo and treated for acute on chronic systolic CHF. He tolerated extubation on 11/26 .  Patient transferred to CIR on 04/27/2017 .   Patient currently requires mod with mobility secondary to muscle weakness, decreased cardiorespiratoy endurance and decreased oxygen support, decreased awareness, decreased memory and delayed processing and decreased standing balance and decreased balance strategies.  Prior to hospitalization, patient was independent  with mobility and lived with Significant other in a [place] home.  Home access is Patient's apartment with 6-8 steps back and front entry.Stairs to enter.  Patient will benefit from skilled PT intervention to maximize safe functional mobility, minimize fall risk and decrease caregiver burden for planned discharge home with intermittent assist.  Anticipate patient will benefit from follow up [facility] at discharge.  PT - End of  Session Activity Tolerance: Tolerates 30+ min activity with multiple rests Endurance Deficit: Yes PT Assessment Rehab Potential (ACUTE/IP ONLY): Good PT Barriers to Discharge: Inaccessible home environment;New oxygen PT Barriers to Discharge Comments: stairs to enter, on O2 PT Patient demonstrates impairments in the following area(s): Balance;Endurance;Motor;Pain PT Transfers Functional Problem(s): Bed Mobility;Bed to Chair;Car;Furniture PT Locomotion Functional Problem(s): Ambulation;Wheelchair Mobility;Stairs PT Plan PT Intensity: Minimum of 1-2 x/day ,45 to 90 minutes PT Frequency: 5 out of 7 days PT Duration Estimated Length of Stay: 21-24 days PT Treatment/Interventions: Ambulation/gait training;DME/adaptive equipment instruction;Neuromuscular re-education;Stair training;UE/LE Strength taining/ROM;Wheelchair propulsion/positioning;UE/LE Coordination activities;Therapeutic Activities;Pain management;Discharge planning;Balance/vestibular training;Cognitive remediation/compensation;Functional mobility training;Patient/family education;Splinting/orthotics;Therapeutic Exercise;Community reintegration PT Transfers Anticipated Outcome(s): supervision PT Locomotion Anticipated Outcome(s): supervision PT Recommendation Recommendations for Other Services: Neuropsych consult Follow Up Recommendations: Home health PT Patient destination: Home Equipment Recommended: To be determined  Skilled Therapeutic Intervention Pt participated in skilled PT eval and pt and sister were educated on PT goals and POC.  Pt requires frequent rests during session and is limited by anxiety requiring increased time and explanation for each activity.  Transfers with cuing for UE placement with mod A for sit to stand, min A for scoot/squat pivot.  Gait with RW 10' x 3 with min A for gait, mod A to stand. Simulated car transfer to sedan height car with mod A, increased time and  cuing.  Pt reports he is too anxious to  attempt stairs this session.  W/c mobility with bilat LEs with mod A fwd and bkwd. Pt left in bed with needs at hand and sister present.  pts spO2 99% on 2LO2 during session.  PT Evaluation Precautions/Restrictions Precautions Precautions: Sternal;Fall Restrictions Other Position/Activity Restrictions: Sternal precautions General   spO2 85% on room air when PT entered room, 99% on 2LO2 [place] Pain Pain Assessment Pain Assessment: No/denies pain Home Living/Prior Functioning Home Living Available Help at Discharge: Family;Available 24 hours/day Type of Home: Apartment Home Access: Stairs to enter Entrance Stairs-Number of Steps: Patient's apartment with 6-8 steps back and front entry. Home Layout: One level Bathroom Shower/Tub: Tub/shower unit(this should be confirmed by family) Additional Comments: pt may be able to stay with sister who has no stairs  Lives With: Significant other Prior Function Level of Independence: Independent with basic ADLs;Independent with transfers;Independent with gait  Able to Take Stairs?: Yes Driving: Yes Vocation: Retired Art gallery manager: Within Advertising copywriter Praxis Praxis: Intact  Cognition Overall Cognitive Status: Impaired/Different from baseline Arousal/Alertness: Awake/alert Attention: Sustained Sustained Attention: Impaired Sustained Attention Impairment: Verbal basic;Functional basic Memory: Impaired Memory Impairment: Decreased short term memory Decreased Short Term Memory: Verbal basic Awareness: Impaired Awareness Impairment: Emergent impairment Problem Solving: Impaired Problem Solving Impairment: Verbal basic;Functional basic Behaviors: Poor frustration tolerance(anxious) Sensation Sensation Light Touch: Appears Intact Stereognosis: Appears Intact Hot/Cold: Appears Intact Proprioception: Appears Intact Coordination Gross Motor Movements are Fluid and Coordinated: Yes Fine Motor Movements are Fluid  and Coordinated: Yes Motor  Motor Motor - Skilled Clinical Observations: generalized muscle weakness   Trunk/Postural Assessment  Cervical Assessment Cervical Assessment: Within Functional Limits Thoracic Assessment Thoracic Assessment: (sternal precautions) Lumbar Assessment Lumbar Assessment: Within Functional Limits Postural Control Postural Control: Deficits on evaluation Righting Reactions: delayed  Balance Static Sitting Balance Static Sitting - Level of Assistance: 7: Independent Dynamic Sitting Balance Dynamic Sitting - Level of Assistance: 4: Min assist;Other (comment)(difficulty with reaching toward feet due to pressure on chest) Static Standing Balance Static Standing - Level of Assistance: 4: Min assist Dynamic Standing Balance Dynamic Standing - Level of Assistance: 3: Mod assist Extremity Assessment  RUE Assessment RUE Assessment: Within Functional Limits(sternal precautions) LUE Assessment LUE Assessment: Within Functional Limits(sternal precautions) RLE Assessment RLE Assessment: (grossly 3/5) LLE Assessment LLE Assessment: (grossly 3/5)   See Function Navigator for Current Functional Status.   Refer to Care Plan for Long Term Goals  Recommendations for other services: Neuropsych  Discharge Criteria: Patient will be discharged from PT if patient refuses treatment 3 consecutive times without medical reason, if treatment goals not met, if there is a change in medical status, if patient makes no progress towards goals or if patient is discharged from hospital.  The above assessment, treatment plan, treatment alternatives and goals were discussed and mutually agreed upon: by patient  [facility] 04/28/2017, 2:41 PM

## 2017-04-28 NOTE — Care Management Note (Signed)
Case Management Note Donn PieriniKristi Willadene Mounsey RN, BSN Unit 4E-Case Manager-- 2H coverage [phone]  Patient Details  Name: Ronald HoveGary Biss MRN: [id number] Date of Birth: 1950/11/17  Subjective/Objective:    Pt admitted with STEMI- taken urgently to cath lab- intubated- s/p stent- post cath pressor dependent and has IABP                Action/Plan: PTA  Pt lived at home- CM to follow   Expected Discharge Date:  04/27/17               Expected Discharge Plan:  IP Rehab Facility  In-House Referral:  NA  Discharge planning Services  CM Consult  Post Acute Care Choice:  IP Rehab Choice offered to:  Patient, Spouse  DME Arranged:    DME Agency:     HH Arranged:    HH Agency:     Status of Service:  Completed, signed off  If discussed at [employer] Length of Stay Meetings, dates discussed:    Discharge Disposition: IP rehab   Additional Comments:  04/27/17- 1500- Donn PieriniKristi Dmari Schubring RN, CM- [facility] and if pt cleared by attending for d/c to CIR - pt will d/c today to IP rehab- HF team has already cleared pt for d/c to rehab. Pt[facility]family [facility]   04/24/17- 1600- Chelise Hanger RN, CM- impella has been removed- pt has been extubated- CM will follow for d/c needs- will need rehab- ?CIR vs SNF.   Darrold SpanWebster, Fatina Sprankle Hall, RN 04/28/2017, 8:45 AM

## 2017-04-28 NOTE — Care Management (Signed)
Inpatient Rehabilitation Center Individual Statement of Services  Patient Name:  Ronald HoveGary [name]  Date:  04/28/2017  Welcome to the Inpatient Rehabilitation Center.  Our goal is to provide you with an individualized program based on your diagnosis and situation, designed to meet your specific needs.  With this comprehensive rehabilitation program, you will be expected to participate in at least 3 hours of rehabilitation therapies Monday-Friday, with modified therapy programming on the weekends.  Your rehabilitation program will include the following services:  Physical Therapy (PT), Occupational Therapy (OT), Speech Therapy (ST), 24 hour per day rehabilitation nursing, Therapeutic Recreaction (TR), Neuropsychology, Case Management (Social Worker), Rehabilitation Medicine, Nutrition Services and Pharmacy Services  Weekly team conferences will be held on Wednesdays to discuss your progress.  Your Social Worker will talk with you frequently to get your input and to update you on team discussions.  Team conferences with you and your family in attendance may also be held.  Expected length of stay: 21-24 days  Overall anticipated outcome: supervision  Depending on your progress and recovery, your program may change. Your Social Worker will coordinate services and will keep you informed of any changes. Your Social Worker's name and contact numbers are listed  below.  The following services may also be recommended but are not provided by the Inpatient Rehabilitation Center:   Driving Evaluations  Home Health Rehabiltiation Services  Outpatient Rehabilitation Services    Arrangements will be made to provide these services after discharge if needed.  Arrangements include referral to agencies that provide these services.  Your insurance has been verified to be:  Medicare (B) and Medicaid Your primary doctor is:    Pertinent information will be shared with your doctor and your insurance  company.  Social Worker:  [place]cy Ilia [place], [place] [phone] or ([phone]) [phone]   Information discussed with and copy given to patient by: Ronald JupiterHOYLE, [name], 04/28/2017, 4:50 PM

## 2017-04-28 NOTE — IPOC Note (Signed)
Overall Plan of Care [facility](IPOC) Patient Details Name: Ronald HoveGary Lindy MRN: [id number] DOB: 08/03/1950  Admitting Diagnosis: <principal problem not specified> debility, encephalopathy  Hospital Problems: Active Problems:   Physical debility   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 [facility](HCC)     Functional Problem List: Nursing Behavior, Bladder, Bowel, Edema, Endurance, Medication Management, Motor, Pain, Perception, Safety, Sensory, Skin Integrity  PT Balance, Endurance, Motor, Pain  OT Balance, Cognition, Endurance, Safety, Motor  SLP    TR         Basic ADL's: OT Grooming, Bathing, Dressing, Toileting     Advanced  ADL's: OT       Transfers: PT Bed Mobility, Bed to Chair, Car, Occupational psychologisturniture  OT Toilet, Research scientist (life sciences)Tub/Shower     Locomotion: PT Ambulation, Psychologist, prison and probation servicesWheelchair Mobility, Stairs     Additional Impairments: OT None  SLP        TR      Anticipated Outcomes Item Anticipated Outcome  Self Feeding supervision  Swallowing      Basic self-care  supervision  Toileting  supervision   Bathroom Transfers supervision  Bowel/Bladder  min assist   Transfers  supervision  Locomotion  supervision  Communication     Cognition     Pain  less<2  Safety/Judgment  min assist   Therapy Plan: PT Intensity: Minimum of 1-2 x/day ,45 to 90 minutes PT Frequency: 5 out of 7 days PT Duration Estimated Length of Stay: 21-24 days OT Intensity: Minimum of 1-2 x/day, 45 to 90 minutes OT Frequency: 5 out of 7 days OT Duration/Estimated Length of Stay: 17 - 21 days      Team Interventions: Nursing Interventions Patient/Family Education, Pain Management, Bowel Management, Bladder Management, Disease Management/Prevention, Medication Management, Skin Care/Wound Management, Dysphagia/Aspiration Precaution Training, Discharge Planning, Cognitive Remediation/Compensation, Psychosocial Support  PT interventions Ambulation/gait training, DME/adaptive equipment instruction,  Neuromuscular re-education, Stair training, UE/LE Strength taining/ROM, Wheelchair propulsion/positioning, UE/LE Coordination activities, Therapeutic Activities, Pain management, Discharge planning, Balance/vestibular training, Cognitive remediation/compensation, Functional mobility training, Patient/family education, Splinting/orthotics, Therapeutic Exercise, Community reintegration  OT Interventions Warden/rangerBalance/vestibular training, Discharge planning, DME/adaptive equipment instruction, Functional mobility training, Cognitive remediation/compensation, Patient/family education, Self Care/advanced ADL retraining, Therapeutic Activities, Therapeutic Exercise, UE/LE Strength taining/ROM  SLP Interventions    TR Interventions    SW/CM Interventions Discharge Planning, Psychosocial Support, Patient/Family Education   Barriers to Discharge MD  Medical stability  Nursing      PT Inaccessible home environment, New oxygen stairs to enter, on O2  OT      SLP      SW       Team Discharge Planning: Destination: PT-Home ,OT- Home , SLP-  Projected Follow-up: PT-Home health PT, OT-  Home health OT, SLP-  Projected Equipment Needs: PT-To be determined, OT- Tub/shower bench, SLP-  Equipment Details: PT- , OT-  Patient/family involved in discharge planning: PT- Patient,  OT-Patient, SLP-   MD ELOS: 18-20 days Medical Rehab Prognosis:  Excellent Assessment: The patient has been admitted for CIR therapies with the diagnosis of debility. The team will be addressing functional mobility, strength, stamina, balance, safety, adaptive techniques and equipment, self-care, bowel and bladder mgt, patient and caregiver education, cognitive perceptual awareness, pain control, and  community reentry. Goals have been set at supervision for basic self-care and mobility as well as ADLs.    Ranelle OysterZachary T. Swartz, MD, FAAPMR      See Team Conference Notes for weekly updates to the plan of care

## 2017-04-28 NOTE — Progress Notes (Signed)
Patient information reviewed and entered into eRehab system by Jaramie Bastos, RN, CRRN, PPS Coordinator.  Information including medical coding and functional independence measure will be reviewed and updated through discharge.     Per nursing patient was given "Data Collection Information Summary for Patients in Inpatient Rehabilitation Facilities with attached "Privacy Act Statement-Health Care Records" upon admission.  

## 2017-04-28 NOTE — Evaluation (Signed)
Occupational Therapy Assessment and Plan  Patient Details  Name: [name] MRN: [id number] Date of Birth: 08/13/1950  OT Diagnosis: cognitive deficits and muscle weakness (generalized) Rehab Potential: Rehab Potential (ACUTE ONLY): Good ELOS: 17 - 21 days   Today's Date: 04/28/2017 OT Individual Time: 0900-1000 OT Individual Time Calculation (min): 60 min     Problem List:  Patient Active Problem List   Diagnosis Date Noted  . Physical debility 04/27/2017  . 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 ([place])   . Acute respiratory failure with hypoxia ([place])   . Hypoxemia   . Congestive heart failure (CHF) ([place])   . Dysphagia   . Bacteremia   . Benign essential HTN   . Chronic neck pain   . Sepsis ([place])   . Acute blood loss anemia   . Tachypnea   . Leukocytosis   . Fever   . Cardiogenic shock ([place])   . Aneurysm of left ventricle of heart 04/12/2017  . Acute anterior wall MI ([place])   . Acute pulmonary edema (HCC)   . Acute hypoxemic respiratory failure ([place])   . ACS (acute coronary syndrome) ([place])   . Acute heart failure ([place])   . Encounter for central line placement   . Encounter for management of intra-aortic balloon pump   . STEMI (ST elevation myocardial infarction) ([place]) 04/11/2017  . COPD (chronic obstructive pulmonary disease) ([place]) 04/11/2017  . CHF (congestive heart failure), NYHA class II, acute, combined ([place]) 04/11/2017    Past Medical History:  Past Medical History:  Diagnosis Date  . Coronary artery disease   . Hypertension    Past Surgical History:  Past Surgical History:  Procedure Laterality Date  . APPLICATION OF WOUND VAC N/A 04/14/2017   Procedure: APPLICATION OF WOUND VAC with reposition of Impella device;  Surgeon: Grace Isaac, MD;  Location: [facility];  Service: Thoracic;  Laterality: N/A;  . CORONARY STENT INTERVENTION N/A 04/11/2017   Procedure: CORONARY STENT INTERVENTION;  Surgeon: Jettie Booze, MD;   Location: [facility];  Service: Cardiovascular;  Laterality: N/A;  . CORONARY THROMBECTOMY N/A 04/11/2017   Procedure: Coronary Thrombectomy;  Surgeon: Jettie Booze, MD;  Location: [facility];  Service: Cardiovascular;  Laterality: N/A;  . CORONARY/GRAFT ACUTE MI REVASCULARIZATION N/A 04/11/2017   Procedure: Coronary/Graft Acute MI Revascularization;  Surgeon: Jettie Booze, MD;  Location: [facility];  Service: Cardiovascular;  Laterality: N/A;  . IABP INSERTION N/A 04/11/2017   Procedure: IABP Insertion;  Surgeon: Jettie Booze, MD;  Location: [facility];  Service: Cardiovascular;  Laterality: N/A;  . LEFT HEART CATH AND CORONARY ANGIOGRAPHY N/A 04/11/2017   Procedure: LEFT HEART CATH AND CORONARY ANGIOGRAPHY;  Surgeon: Jettie Booze, MD;  Location: [facility];  Service: Cardiovascular;  Laterality: N/A;  . PLACEMENT OF IMPELLA LEFT VENTRICULAR ASSIST DEVICE  04/12/2017   Procedure: PLACEMENT OF IMPELLA  LD;  Surgeon: Grace Isaac, MD;  Location: [facility];  Service: Open Heart Surgery;;  . REMOVAL OF IMPELLA LEFT VENTRICULAR ASSIST DEVICE N/A 04/17/2017   Procedure: REMOVAL OF IMPELLA LEFT VENTRICULAR ASSIST DEVICE;  Surgeon: Grace Isaac, MD;  Location: [facility];  Service: Open Heart Surgery;  Laterality: N/A;  . REPAIR OF RIGHT VENTRICLE LACERATION Left 04/12/2017   Procedure: Resection of LV Aneurysm;  Surgeon: Grace Isaac, MD;  Location: [facility];  Service: Open Heart Surgery;  Laterality: Left;  . STERNAL CLOSURE N/A 04/14/2017  Procedure: STERNAL CLOSURE;  Surgeon: Grace Isaac, MD;  Location: [facility];  Service: Thoracic;  Laterality: N/A;  . TEE WITHOUT CARDIOVERSION N/A 04/14/2017   Procedure: TRANSESOPHAGEAL ECHOCARDIOGRAM (TEE);  Surgeon: Grace Isaac, MD;  Location: [facility] OR;  Service: Thoracic;  Laterality: N/A;  . ULTRASOUND GUIDANCE FOR VASCULAR ACCESS  04/11/2017   Procedure: Ultrasound Guidance  For Vascular Access;  Surgeon: Jettie Booze, MD;  Location: [facility];  Service: Cardiovascular;;    Assessment & Plan Clinical Impression: [name] is a 66 y.o. male with history of CAD, HTN, COPD, chronic neck pain;  who was admitted on 04/11/17 with SOB and malaise due to inferior lateral STEMI, lethargy and cardiogenic shock requiring intubation in ED. History taken from chart review and sister.  He underwent cardiac cath with RCA DES and balloon angioplasty of mid LAD. He required IABP as well as neo due to hypotension. Echo showed diffuse hypokinesis with moderate pericardial effusion and EF 25-30%.  Dr. Servando Snare consulted due to concerns of LV rupture and TEE showed contained rupture with extensive thrombus. He was taken to OR for CABG with resection of LV aneurysm with placement of impella 11/14 and underwent chest closure on 11/17.  Hospital course significant for E coli in 1/2 blood cultures and wound--antibiotic coverage broadened to meropenum but with upward trend in temps therefore Vancomycin added on 11/27.    He has been weaned of Neo and treated for acute on chronic systolic CHF.  He tolerated extubation on 11/26 and MBS done 11/28 to evaluate swallow. He was started on pudding/puree diet and foley d/c yesterday.  Therapy evaluations done yesterday revealing debility and CIR recommended due to functional deficits.      Patient transferred to CIR on 04/27/2017 .    Patient currently requires mod with basic self-care skills secondary to muscle weakness, decreased cardiorespiratoy endurance, decreased attention, decreased problem solving, decreased safety awareness and decreased memory and decreased sitting balance and decreased standing balance.  Prior to hospitalization, patient could complete ADLs independently and was driving.  Patient will benefit from skilled intervention to increase independence with basic self-care skills prior to discharge home with care  partner.  Anticipate patient will require intermittent supervision and follow up home health.  OT - End of Session Activity Tolerance: Tolerates 10 - 20 min activity with multiple rests Endurance Deficit: Yes(Pt states he feels dizzy when standing) OT Assessment Rehab Potential (ACUTE ONLY): Good OT Patient demonstrates impairments in the following area(s): Balance;Cognition;Endurance;Safety;Motor OT Basic ADL's Functional Problem(s): Grooming;Bathing;Dressing;Toileting OT Transfers Functional Problem(s): Toilet;Tub/Shower OT Additional Impairment(s): None OT Plan OT Intensity: Minimum of 1-2 x/day, 45 to 90 minutes OT Frequency: 5 out of 7 days OT Duration/Estimated Length of Stay: 17 - 21 days OT Treatment/Interventions: Balance/vestibular training;Discharge planning;DME/adaptive equipment instruction;Functional mobility training;Cognitive remediation/compensation;Patient/family education;Self Care/advanced ADL retraining;Therapeutic Activities;Therapeutic Exercise;UE/LE Strength taining/ROM OT Self Feeding Anticipated Outcome(s): supervision OT Basic Self-Care Anticipated Outcome(s): supervision OT Toileting Anticipated Outcome(s): supervision OT Bathroom Transfers Anticipated Outcome(s): supervision OT Recommendation Patient destination: Home Follow Up Recommendations: Home health OT Equipment Recommended: Tub/shower bench   Skilled Therapeutic Intervention Pt seen for initial evaluation and ADL training with a focus on following his sternal precautions with mobility.  Pt was connected to IV treatment so bath performed from EOB.  Pt on 3 L of O2.  Pt needed a great deal of encouragement as he was very apprehensive to move and was convinced that he would fall and get hurt.  Pt was eventually  able to work with this therapist and practice 2 transfers and standing with max cues/ encouragement.  He has limited attention, tangential speech patterns. At times, he is able to answer questions  clearly and discuss things logically, but other times he would have rambling speech about extraneous topics.  He did well keeping his arms toward his chest and only using his legs to stand and transfer with mod A.  Pt resting in w/c at end of session with quick release belt on and all needs met.   OT Evaluation Precautions/Restrictions  Precautions Precautions: Sternal;Fall Restrictions Other Position/Activity Restrictions: Sternal precautions Pain Pain Assessment Pain Assessment: No/denies pain Home Living/Prior Functioning Home Living Available Help at Discharge: Family, Available 24 hours/day Type of Home: Apartment Home Access: Stairs to enter Entrance Stairs-Number of Steps: Patient's apartment with 6-8 steps back and front entry. Home Layout: One level Bathroom Shower/Tub: Tub/shower unit(this should be confirmed by family)  Lives With: Significant other Prior Function Level of Independence: Independent with basic ADLs, Independent with transfers, Independent with gait  Able to Take Stairs?: Yes Driving: Yes Vocation: Retired ADL ADL ADL Comments: Refer to functional navigator Vision Baseline Vision/History: No visual deficits Patient Visual Report: No change from baseline Vision Assessment?: No apparent visual deficits Perception  Perception: Within Functional Limits Praxis Praxis: Intact Cognition Overall Cognitive Status: Impaired/Different from baseline Arousal/Alertness: Awake/alert Orientation Level: Person;Place;Situation Person: Oriented Place: Oriented Situation: Oriented Year: 2018 Month: November Day of Week: Incorrect(Thursday) Memory: Impaired Memory Impairment: Decreased short term memory Decreased Short Term Memory: Verbal basic Immediate Memory Recall: Sock;Blue;Bed Memory Recall: Sock;Blue;Bed Memory Recall Sock: Without Cue Memory Recall Blue: Without Cue Memory Recall Bed: Without Cue Attention: Sustained Sustained Attention:  Impaired Sustained Attention Impairment: Verbal basic;Functional basic Awareness: Impaired Awareness Impairment: Emergent impairment Problem Solving: Impaired Problem Solving Impairment: Verbal basic;Functional basic Behaviors: Poor frustration tolerance Sensation Sensation Light Touch: Appears Intact Stereognosis: Appears Intact Hot/Cold: Appears Intact Proprioception: Appears Intact Motor  Motor Motor - Skilled Clinical Observations: generalized muscle weakness Mobility    refer to functional navigator Trunk/Postural Assessment  Cervical Assessment Cervical Assessment: Within Functional Limits Thoracic Assessment Thoracic Assessment: Within Functional Limits Lumbar Assessment Lumbar Assessment: Within Functional Limits Postural Control Postural Control: Within Functional Limits  Balance Static Sitting Balance Static Sitting - Level of Assistance: 7: Independent Dynamic Sitting Balance Dynamic Sitting - Level of Assistance: 4: Min assist;Other (comment)(difficulty with reaching toward feet due to pressure on chest) Static Standing Balance Static Standing - Level of Assistance: 4: Min assist Dynamic Standing Balance Dynamic Standing - Level of Assistance: 3: Mod assist Extremity/Trunk Assessment RUE Assessment RUE Assessment: Within Functional Limits(sternal precautions) LUE Assessment LUE Assessment: Within Functional Limits(sternal precautions)   See Function Navigator for Current Functional Status.   Refer to Care Plan for Long Term Goals  Recommendations for other services: None    Discharge Criteria: Patient will be discharged from OT if patient refuses treatment 3 consecutive times without medical reason, if treatment goals not met, if there is a change in medical status, if patient makes no progress towards goals or if patient is discharged from hospital.  The above assessment, treatment plan, treatment alternatives and goals were discussed and mutually  agreed upon: by patient  [facility] 04/28/2017, 12:33 PM

## 2017-04-28 NOTE — Progress Notes (Signed)
Social Work  Social Work Assessment and Plan  Patient Details  Name: [name] MRN: [id number] Date of Birth: 1950/11/27  Today's Date: 04/28/2017  Problem List:  Patient Active Problem List   Diagnosis Date Noted  . Confusion   . Hyperglycemia   . Hypokalemia   . Anoxic brain injury (HCC)   . Chronic systolic (congestive) heart failure (HCC)   . CAD in native artery   . Physical debility 04/27/2017  . 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 Acute respiratory failure with hypoxia (HCC)   . Hypoxemia   . Congestive heart failure (CHF) (HCC)   . Dysphagia   . Bacteremia   . Benign essential HTN   . Chronic neck pain   . Sepsis (HCC)   . Acute blood loss anemia   . Tachypnea   . Leukocytosis   . Fever   . Cardiogenic shock (HCC)   . Aneurysm of left ventricle of heart 04/12/2017  . Acute anterior wall MI (HCC)   . Acute pulmonary edema (HCC)   . Acute hypoxemic respiratory failure (HCC)   . ACS (acute coronary syndrome) (HCC)   . Acute heart failure (HCC)   . Encounter for central line placement   . Encounter for management of intra-aortic balloon pump   . STEMI (ST elevation myocardial infarction) (HCC) 04/11/2017  . COPD (chronic obstructive pulmonary disease) (HCC) 04/11/2017  . CHF (congestive heart failure), NYHA class II, acute, combined (HCC) 04/11/2017   Past Medical History:  Past Medical History:  Diagnosis Date  . Coronary artery disease   . Hypertension    Past Surgical History:  Past Surgical History:  Procedure Laterality Date  . APPLICATION OF WOUND VAC N/A 04/14/2017   Procedure: APPLICATION OF WOUND VAC with reposition of Impella device;  Surgeon: Delight Ovens, MD;  Location: [facility] OR;  Service: Thoracic;  Laterality: N/A;  . CORONARY STENT INTERVENTION N/A 04/11/2017   Procedure: CORONARY STENT INTERVENTION;  Surgeon: Corky Crafts, MD;  Location: [facility] INVASIVE CV LAB;  Service: Cardiovascular;   Laterality: N/A;  . CORONARY THROMBECTOMY N/A 04/11/2017   Procedure: Coronary Thrombectomy;  Surgeon: Corky Crafts, MD;  Location: [facility] INVASIVE CV LAB;  Service: Cardiovascular;  Laterality: N/A;  . CORONARY/GRAFT ACUTE MI REVASCULARIZATION N/A 04/11/2017   Procedure: Coronary/Graft Acute MI Revascularization;  Surgeon: Corky Crafts, MD;  Location: [facility] INVASIVE CV LAB;  Service: Cardiovascular;  Laterality: N/A;  . IABP INSERTION N/A 04/11/2017   Procedure: IABP Insertion;  Surgeon: Corky Crafts, MD;  Location: [facility] INVASIVE CV LAB;  Service: Cardiovascular;  Laterality: N/A;  . LEFT HEART CATH AND CORONARY ANGIOGRAPHY N/A 04/11/2017   Procedure: LEFT HEART CATH AND CORONARY ANGIOGRAPHY;  Surgeon: Corky Crafts, MD;  Location: [facility] INVASIVE CV LAB;  Service: Cardiovascular;  Laterality: N/A;  . PLACEMENT OF IMPELLA LEFT VENTRICULAR ASSIST DEVICE  04/12/2017   Procedure: PLACEMENT OF IMPELLA  LD;  Surgeon: Delight Ovens, MD;  Location: [facility] OR;  Service: Open Heart Surgery;;  . REMOVAL OF IMPELLA LEFT VENTRICULAR ASSIST DEVICE N/A 04/17/2017   Procedure: REMOVAL OF IMPELLA LEFT VENTRICULAR ASSIST DEVICE;  Surgeon: Delight Ovens, MD;  Location: [facility] OR;  Service: Open Heart Surgery;  Laterality: N/A;  . REPAIR OF RIGHT VENTRICLE LACERATION Left 04/12/2017   Procedure: Resection of LV Aneurysm;  Surgeon: Delight Ovens, MD;  Location: [facility] OR;  Service: Open Heart Surgery;  Laterality: Left;  . STERNAL CLOSURE N/A  04/14/2017   Procedure: STERNAL CLOSURE;  Surgeon: Delight OvensGerhardt, Edward B, MD;  [facility];  Service: Thoracic;  Laterality: N/A;  . TEE WITHOUT CARDIOVERSION N/A 04/14/2017   Procedure: TRANSESOPHAGEAL ECHOCARDIOGRAM (TEE);  Surgeon: Delight OvensGerhardt, Edward B, MD;  [facility];  Service: Thoracic;  Laterality: N/A;  . ULTRASOUND GUIDANCE FOR VASCULAR ACCESS  04/11/2017   Procedure: Ultrasound Guidance For Vascular Access;  Surgeon: Corky CraftsVaranasi, Jayadeep S, MD;   [facility];  Service: Cardiovascular;;   Social History:  reports that he has been smoking cigarettes.  he has never used smokeless tobacco. His alcohol and drug histories are not on file.  Family / Support Systems Marital Status: Divorced How Long?: x2;  together with girlfriend, [name] ~ 28 yrs Patient Roles: Partner, Other (Comment) Spouse/Significant Other: girlfriend, [name] ([place] and with limited english) Children: Pt has one daughter, [name], who does live locally but very little contact with her - not anticipated to provide any support. Other Supports: sister, [name] @ [phone](C) [phone] (local);  sister notes pt has total of 5 siblings with 3 living locally, however, she is the "only one who could really help." Anticipated Caregiver: Sister Ability/Limitations of Caregiver: Significant other works a lot.  Sister is disabled, not working and can assist. Medical laboratory scientific officerCaregiver Availability: 24/7 Family Dynamics: Pt reports good relationship with his sister, [name].  She reports that she is "more than happy to have him stay with me" at discharge.  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   Abuse/Neglect Abuse/Neglect Assessment Can Be Completed: Yes Physical Abuse: Denies Verbal Abuse: Denies Sexual Abuse: Denies Exploitation of patient/patient's resources: Denies Self-Neglect: Denies  Emotional Status Pt's affect, behavior adn adjustment status: Pt very difficult to understand due to mumbled speech and apparent cognitive impairment/ confusion.  Per review with his sister, some of the information he provided was correct and simply needed some additional  clarification.  Sister feels his confusion is "because he's only been out of ICU for 4 days..."  Will refer for neuropsychology consult for further evaluation. Recent Psychosocial Issues: None Pyschiatric History: None Substance Abuse History: None  Patient / Family Perceptions, Expectations & Goals Pt/Family understanding of illness & functional limitations: Pt repeatedly refers back to his "broke neck" as reason for this admission.  He does not speak to any cardiac issues.  Sister notes his neck injury was in [id number]. Premorbid pt/family roles/activities: Pt is retired and completely independent Anticipated changes in roles/activities/participation: Pt expected to need at least 24/7 supervision - sister willing to assume primary caregiver role. Pt/family expectations/goals: Sister hopeful his cognition will clear over the next few days.  Pt eager to d/c home ASAP.  Community Resources [employer]Community Agencies: None Premorbid Home Care/DME Agencies: None Transportation available at discharge: yes Resource referrals recommended: Neuropsychology  Discharge Planning Living Arrangements: Spouse/significant other Support Systems: Spouse/significant other, Other relatives Type of Residence: Private residence Insurance Resources: [place], [place] [place]) Financial Screen Referred: No Living Expenses: Own Money Management: Patient Does the patient have any problems obtaining your medications?: No Home Management: pt and girlfriend Patient/Family Preliminary Plans: Sister has offered for pt to d/c to her home as she is not working and the entry is more accessible. Social Work Anticipated Follow Up  Needs: HH/OP Expected length of stay: 21-24 days  Clinical Impression Unfortunate gentleman here following MI, CABG and resection of LV aneurysm and now with debility and confusion/ cognitive impairment. Sister and pt's girlfriend are very supportive and hopeful the cognition will  clear over the next few days.  Sister also willing to have pt d/c to her home where she can provide 24/7 supervision.  Will follow for support and d/c planning needs.  [name] 04/28/2017, 4:49 PM

## 2017-04-28 NOTE — Progress Notes (Signed)
Patient ID: [name], male   DOB: 02-20-1951, 66 y.o.   MRN: [id number]      [address]       [place] [id number]             [phone]                     LOS: 1 day   Subjective: Progressing in rehab   Objective: Vital signs in last 24 hours: Patient Vitals for the past 24 hrs:  BP Temp Temp src Pulse Resp SpO2 Weight  04/28/17 1510 122/68 98 F (36.7 C) Oral 82 (!) 82 94 % -  04/28/17 0400 (!) 143/59 97.9 F (36.6 C) Oral 83 (!) 40 97 % 194 lb 3.6 oz (88.1 kg)  04/27/17 2100 (!) 124/57 - - 78 - 97 % -    Filed Weights   04/28/17 0400  Weight: 194 lb 3.6 oz (88.1 kg)    Hemodynamic parameters for last 24 hours:    Intake/Output from previous day: 11/29 0701 - 11/30 0700 In: 870 [P.O.:120; NG/GT:400; IV Piggyback:350] Out: 700 [Urine:700] Intake/Output this shift: Total I/O In: 20 [I.V.:20] Out: 250 [Urine:250]  Scheduled Meds: . amiodarone  200 mg Per Tube BID  . aspirin  81 mg Per Tube Daily  . atorvastatin  80 mg Per Tube q1800  . carvedilol  3.125 mg Oral BID WC  . Chlorhexidine Gluconate Cloth  6 each Topical Daily  . enoxaparin (LOVENOX) injection  40 mg Subcutaneous Q24H  . free water  200 mL Per Tube Q6H  . insulin aspart  0-24 Units Subcutaneous Q4H  . losartan  25 mg Oral QHS  . mouth rinse  15 mL Mouth Rinse BID  . pantoprazole sodium  40 mg Per Tube Daily  . QUEtiapine  50 mg Per Tube BID  . sennosides  10 mL Oral QHS  . sodium chloride flush  10-40 mL Intracatheter Q12H  . ticagrelor  90 mg Per Tube BID   Continuous Infusions: . feeding supplement (VITAL 1.5 CAL) Stopped (04/28/17 1211)  . meropenem (MERREM) IV 1 g (04/28/17 1547)  . vancomycin Stopped (04/28/17 1000)   PRN Meds:.acetaminophen, albuterol, alum & mag hydroxide-simeth, bisacodyl, diphenhydrAMINE, Michelangelo Rindfleisch's butt cream, guaiFENesin-dextromethorphan, prochlorperazine **OR** prochlorperazine **OR** prochlorperazine, RESOURCE THICKENUP CLEAR, sodium  chloride flush, sodium phosphate, traZODone  General appearance: alert Neurologic: intact Heart: regular rate and rhythm, S1, S2 normal, no murmur, click, rub or gallop Lungs: diminished breath sounds bibasilar Abdomen: soft, non-tender; bowel sounds normal; no masses,  no organomegaly Extremities: extremities normal, atraumatic, no cyanosis or edema and Homans sign is negative, no sign of DVT Wound: sternum stable, incision intact Feeding tube in place   Lab Results: CBC: Recent Labs    04/27/17 0411 04/28/17 0444  WBC 17.1* 15.0*  HGB 9.4* 9.1*  HCT 29.7* 29.7*  PLT 643* 640*   BMET:  Recent Labs    04/27/17 0411 04/28/17 0444  NA 139 138  K 3.5 4.1  CL 106 106  CO2 28 28  GLUCOSE 112* 106*  BUN 29* 24*  CREATININE 0.73 0.67  CALCIUM 8.3* 8.5*    PT/INR: No results for input(s): LABPROT, INR in the last 72 hours.   Radiology Dg Chest Port 1 View  Result Date: 04/27/2017 CLINICAL DATA:  Respiratory failure, shortness of Breath EXAM: PORTABLE CHEST 1 VIEW COMPARISON:  04/26/2017 FINDINGS: Cardiomegaly with vascular congestion. Small left pleural effusion, stable. Left lower lobe atelectasis or  consolidation, unchanged. No focal opacity on the right. Right PICC line is unchanged. IMPRESSION: Cardiomegaly with vascular congestion. Left lower lobe opacity and small left effusion, stable. Electronically Signed   By: Charlett NoseKevin  Dover M.D.   On: 04/27/2017 08:36     Assessment/Plan: Wounds intact Wbc returning to normal, blood culture have been negative  Progressing with rehab  Ronald OvensEdward B Findlay Dagher MD 04/28/2017 4:10 PM

## 2017-04-29 ENCOUNTER — Inpatient Hospital Stay (HOSPITAL_COMMUNITY): Payer: Medicare Other

## 2017-04-29 ENCOUNTER — Inpatient Hospital Stay (HOSPITAL_COMMUNITY): Payer: Medicare Other | Admitting: Occupational Therapy

## 2017-04-29 DIAGNOSIS — I952 Hypotension due to drugs: Secondary | ICD-10-CM | POA: Diagnosis not present

## 2017-04-29 DIAGNOSIS — R410 Disorientation, unspecified: Secondary | ICD-10-CM | POA: Diagnosis not present

## 2017-04-29 DIAGNOSIS — R0989 Other specified symptoms and signs involving the circulatory and respiratory systems: Secondary | ICD-10-CM | POA: Diagnosis not present

## 2017-04-29 DIAGNOSIS — I509 Heart failure, unspecified: Secondary | ICD-10-CM | POA: Diagnosis not present

## 2017-04-29 DIAGNOSIS — J90 Pleural effusion, not elsewhere classified: Secondary | ICD-10-CM | POA: Diagnosis not present

## 2017-04-29 DIAGNOSIS — E872 Acidosis: Secondary | ICD-10-CM | POA: Diagnosis not present

## 2017-04-29 DIAGNOSIS — G931 Anoxic brain damage, not elsewhere classified: Secondary | ICD-10-CM | POA: Diagnosis present

## 2017-04-29 DIAGNOSIS — F17210 Nicotine dependence, cigarettes, uncomplicated: Secondary | ICD-10-CM | POA: Diagnosis not present

## 2017-04-29 DIAGNOSIS — R1310 Dysphagia, unspecified: Secondary | ICD-10-CM | POA: Diagnosis not present

## 2017-04-29 DIAGNOSIS — Z955 Presence of coronary angioplasty implant and graft: Secondary | ICD-10-CM | POA: Diagnosis not present

## 2017-04-29 DIAGNOSIS — I10 Essential (primary) hypertension: Secondary | ICD-10-CM | POA: Diagnosis not present

## 2017-04-29 DIAGNOSIS — I5022 Chronic systolic (congestive) heart failure: Secondary | ICD-10-CM | POA: Diagnosis present

## 2017-04-29 DIAGNOSIS — R21 Rash and other nonspecific skin eruption: Secondary | ICD-10-CM | POA: Diagnosis not present

## 2017-04-29 DIAGNOSIS — I2510 Atherosclerotic heart disease of native coronary artery without angina pectoris: Secondary | ICD-10-CM

## 2017-04-29 DIAGNOSIS — R739 Hyperglycemia, unspecified: Secondary | ICD-10-CM | POA: Diagnosis not present

## 2017-04-29 DIAGNOSIS — R5381 Other malaise: Secondary | ICD-10-CM | POA: Diagnosis not present

## 2017-04-29 DIAGNOSIS — D72829 Elevated white blood cell count, unspecified: Secondary | ICD-10-CM | POA: Diagnosis not present

## 2017-04-29 DIAGNOSIS — I110 Hypertensive heart disease with heart failure: Secondary | ICD-10-CM | POA: Diagnosis not present

## 2017-04-29 DIAGNOSIS — Z9861 Coronary angioplasty status: Secondary | ICD-10-CM | POA: Diagnosis not present

## 2017-04-29 DIAGNOSIS — J029 Acute pharyngitis, unspecified: Secondary | ICD-10-CM | POA: Diagnosis not present

## 2017-04-29 DIAGNOSIS — I480 Paroxysmal atrial fibrillation: Secondary | ICD-10-CM | POA: Diagnosis not present

## 2017-04-29 DIAGNOSIS — F419 Anxiety disorder, unspecified: Secondary | ICD-10-CM | POA: Diagnosis not present

## 2017-04-29 DIAGNOSIS — Z951 Presence of aortocoronary bypass graft: Secondary | ICD-10-CM | POA: Diagnosis not present

## 2017-04-29 DIAGNOSIS — I2109 ST elevation (STEMI) myocardial infarction involving other coronary artery of anterior wall: Secondary | ICD-10-CM | POA: Diagnosis not present

## 2017-04-29 DIAGNOSIS — Z9981 Dependence on supplemental oxygen: Secondary | ICD-10-CM | POA: Diagnosis not present

## 2017-04-29 DIAGNOSIS — J449 Chronic obstructive pulmonary disease, unspecified: Secondary | ICD-10-CM | POA: Diagnosis not present

## 2017-04-29 DIAGNOSIS — D62 Acute posthemorrhagic anemia: Secondary | ICD-10-CM | POA: Diagnosis not present

## 2017-04-29 DIAGNOSIS — E876 Hypokalemia: Secondary | ICD-10-CM | POA: Diagnosis not present

## 2017-04-29 LAB — GLUCOSE, CAPILLARY
GLUCOSE-CAPILLARY: 101 mg/dL — AB (ref 65–99)
GLUCOSE-CAPILLARY: 121 mg/dL — AB (ref 65–99)
GLUCOSE-CAPILLARY: 128 mg/dL — AB (ref 65–99)
Glucose-Capillary: 115 mg/dL — ABNORMAL HIGH (ref 65–99)
Glucose-Capillary: 115 mg/dL — ABNORMAL HIGH (ref 65–99)
Glucose-Capillary: 132 mg/dL — ABNORMAL HIGH (ref 65–99)
Glucose-Capillary: 144 mg/dL — ABNORMAL HIGH (ref 65–99)

## 2017-04-29 LAB — COOXEMETRY PANEL
Carboxyhemoglobin: 1.2 % (ref 0.5–1.5)
Methemoglobin: 1.1 % (ref 0.0–1.5)
O2 Saturation: 61 %
Total hemoglobin: 8.6 g/dL — ABNORMAL LOW (ref 12.0–16.0)

## 2017-04-29 LAB — COMPREHENSIVE METABOLIC PANEL
ALK PHOS: 93 U/L (ref 38–126)
ALT: 50 U/L (ref 17–63)
ANION GAP: 5 (ref 5–15)
AST: 36 U/L (ref 15–41)
Albumin: 2.3 g/dL — ABNORMAL LOW (ref 3.5–5.0)
BUN: 21 mg/dL — ABNORMAL HIGH (ref 6–20)
CALCIUM: 8.5 mg/dL — AB (ref 8.9–10.3)
CO2: 29 mmol/L (ref 22–32)
CREATININE: 0.66 mg/dL (ref 0.61–1.24)
Chloride: 106 mmol/L (ref 101–111)
Glucose, Bld: 113 mg/dL — ABNORMAL HIGH (ref 65–99)
Potassium: 4 mmol/L (ref 3.5–5.1)
SODIUM: 140 mmol/L (ref 135–145)
TOTAL PROTEIN: 6 g/dL — AB (ref 6.5–8.1)
Total Bilirubin: 0.8 mg/dL (ref 0.3–1.2)

## 2017-04-29 LAB — CBC
HEMATOCRIT: 31 % — AB (ref 39.0–52.0)
HEMOGLOBIN: 9.4 g/dL — AB (ref 13.0–17.0)
MCH: 26.9 pg (ref 26.0–34.0)
MCHC: 30.3 g/dL (ref 30.0–36.0)
MCV: 88.8 fL (ref 78.0–100.0)
Platelets: 596 10*3/uL — ABNORMAL HIGH (ref 150–400)
RBC: 3.49 MIL/uL — ABNORMAL LOW (ref 4.22–5.81)
RDW: 15.1 % (ref 11.5–15.5)
WBC: 14.1 10*3/uL — AB (ref 4.0–10.5)

## 2017-04-29 LAB — VANCOMYCIN, TROUGH: VANCOMYCIN TR: 16 ug/mL (ref 15–20)

## 2017-04-29 LAB — MAGNESIUM: MAGNESIUM: 2.1 mg/dL (ref 1.7–2.4)

## 2017-04-29 MED ORDER — ALTEPLASE 2 MG IJ SOLR
2.0000 mg | Freq: Once | INTRAMUSCULAR | Status: AC
  Administered 2017-04-29: 2 mg
  Filled 2017-04-29 (×2): qty 2

## 2017-04-29 NOTE — Progress Notes (Signed)
04/29/17 1239 nursing Patient claims he's tired; refuses lunch; vital signs stable. Continued to monitor.

## 2017-04-29 NOTE — Progress Notes (Signed)
Pharmacy Antibiotic Note  [name] is a 66 y.o. male admitted on 11/13 and is s/p cath, CABG, and LV aneurysm repair. Pharmacy consulted for meropenem and vancomycin for ESBL ecoli and suspected PNA.   Vancomycin trough is therapeutic at 16 mcg/mL and drawn appropriately. WBC trending down 17.1 > 15 and patient is afebrile.   Plan: Meropenem course finished 11/30  Continue Vancomycin 750 mg IV q8hr  Monitor renal function, clinical picture, and culture data F/u length of therapy  Weight: 194 lb 3.6 oz (88.1 kg)  Temp (24hrs), Avg:98 F (36.7 C), Min:97.9 F (36.6 C), Max:98 F (36.7 C)  Recent Labs  Lab 04/24/17 0423  04/25/17 0337 04/26/17 0330 04/26/17 0945 04/27/17 0411 04/27/17 1224 04/28/17 0444 04/29/17 0038  WBC 20.0*  --  20.7*  --  22.1* 17.1*  --  15.0*  --   CREATININE  --    < > 0.65 0.66 0.74 0.73  --  0.67  --   VANCOTROUGH  --   --   --   --   --   --  22*  --  16   < > = values in this interval not displayed.    Estimated Creatinine Clearance: 93.6 mL/min (by C-G formula based on SCr of 0.67 mg/dL).    No Known Allergies  Einar CrowKatherine Mar Walmer, PharmD Clinical Pharmacist 04/29/17 2:03 AM

## 2017-04-29 NOTE — Progress Notes (Signed)
  [facility]IC[facility] RE[facility]ABILITATION     PROGRESS NOTE    Subjective/Complaints:  He denies complaints Nurse states that he has had some Objective: Vital Signs: Blood pressure (!) 124/45, pulse 78, temperature 97.9 F (36.6 C), temperature source Oral, resp. rate 20, weight 197 lb 8.5 oz (89.6 kg), SpO2 98 %.  Nad,  Chest cta- sternal scar- no edema, no discharge cv reg rate abd- soft Ext- no edema  Assessment/Plan: 1.  Functional deficits and decreased mobility secondary to debility after multiple medical complications   Medical Problem List and Plan: 1.   Debility secondary to coronary artery disease and left ventricular rupture with acute systolic heart failure   - beginning therapies today  2. DVT Prophylaxis/Anticoagulation: Pharmaceutical:Lovenox 3. Pain Management: Use acetaminophen as needed 4. Mood:[name] to follow for evaluation and support. 5. Neuropsych: This patientis not fullycapable of making decisions on hisown behalf. 6. Skin/Wound Care:Monitor surgical wounds daily for healing .\ 7. Fluids/Electrolytes/Nutrition: Basic Metabolic Panel:    Component Value Date/Time   NA 140 04/29/2017 0848   K 4.0 04/29/2017 0848   CL 106 04/29/2017 0848   CO2 29 04/29/2017 0848   BUN 21 (H) 04/29/2017 0848   CREATININE 0.66 04/29/2017 0848   GLUCOSE 113 (H) 04/29/2017 0848   CALCIUM 8.5 (L) 04/29/2017 0848    8. ESBL E coli (penile meatus): complete meropenam today 9. Hypokalemia: resolved. 10.Anoxic BI? /Delirium: Continue Seroquel and wean over next few days.    Mental status change that nurse noted may be related i'll check labs and cxr today (see above) CBC:    Component Value Date/Time   WBC 14.1 (H) 04/29/2017 0848   HGB 9.4 (L) 04/29/2017 0848   HCT 31.0 (L) 04/29/2017 0848   PLT 596 (H) 04/29/2017 0848   MCV 88.8 04/29/2017 0848   NEUTROABS 12.2 (H) 04/28/2017 0444   LYMPHSABS 1.1 04/28/2017 0444   MONOABS 1.3 (H) 04/28/2017 0444    EOSABS 0.4 04/28/2017 0444   BASOSABS 0.1 04/28/2017 0444     11. Leukocytosis: Continue to monitor for trends -20.0 -->22.1-->17.1>>15. now 14.1 Has been afebrile  12. PAF: In NSR on amiodarone 200 mg bid and coreg addedon11/29 13. COPD with chronic hypoxic/hypercarbic failure:Requiring 4 L oxygen per [place]--wean as able. Encouraging IS 14. Acute systolic heart failure due to cardiogenic shock: EF improved to 40%. No lasix. Monitor weight for signs of overload. Check weights daily.  15. Hypokalemia: K+ 4.1 today.  Continue twice daily supplementation and recheck labs on Monday. 16. ABLA: Monitor H/H serially.  9.4 today   LOS (Days) 2 A FACE TO FACE EVALUATION WAS PERFORMED  Lindley MagnusBruce H Yasheka Fossett, MD 04/29/2017 10:40 AM

## 2017-04-29 NOTE — Progress Notes (Signed)
Occupational Therapy Session Note  Patient Details  Name: [name] MRN: [id number] Date of Birth: Aug 17, 1950  Today's Date: 04/29/2017 OT Individual Time: 5397-6734 OT Individual Time Calculation (min): 35 min   Short Term Goals: Week 1:  OT Short Term Goal 1 (Week 1): Pt will be able to sit >< stand with min A to prepare for LB dressing. OT Short Term Goal 2 (Week 1): Pt will be able to transfer on and off toilet with min A. OT Short Term Goal 3 (Week 1): Pt will demonstrate improved dynamic standing balance to be able to reach around to cleanse bottom with min A. OT Short Term Goal 4 (Week 1): Pt will don pants with min A. OT Short Term Goal 5 (Week 1): Pt will stay focused on ADL task with min cues.  Skilled Therapeutic Interventions/Progress Updates:    Pt semi-reclined in bed upon OT arrival. Pt on room air SpO2 95. Pt anxious this am and difficult to understanding 2/2 nonsensical speech and low voice. With encouragement, pt agreeable to sit EOB. Pt needed max cues not to use UEs to come to sitting 2/2 sternal precautions, mod A overall. Once seated EOB, SpO2 checked again at 97, BP 133/75. Pt needed mod A to don slip on shoes, then needed rest break. Pt with productive cough sitting upright and able to utilize suction appropriately with set-up. After ~ 5 mins at EOB, pt stated he felt like he was going to pass out. Encouraged pt to maintain sitting and explained pt his vitals were stable. Pt scooted along EOB with mod A then returned to bed with min A to lift LEs. Pt declined to participate further and left semi-reclined in bed with needs met.   Therapy Documentation Precautions:  Precautions Precautions: Sternal, Fall Restrictions Other Position/Activity Restrictions: Sternal precautions  See Function Navigator for Current Functional Status.   Therapy/Group: Individual Therapy  Valma Cava 04/29/2017, 9:40 AM

## 2017-04-29 NOTE — Progress Notes (Signed)
Am flush initiated but only able to get In 60 ml when pt is noted to become more confused and using the accessory muscles to breathe. O2 @ 2L per [facility] with sats = 98 % and v/s obtained = 117/49 (bp), 78 (pulse) and pt is concerned with the slime being put onto the fish and the television is currently not on. Pt is also noted to be hallucinating thinking that he has visitors who are unseen by staff. T/c placed to on call provider awaiting return phone call and report given to oncoming nurse.

## 2017-04-29 NOTE — Progress Notes (Signed)
On call provider in at bedside and full report given. Physician assessment and treatment to follow.

## 2017-04-29 NOTE — Progress Notes (Addendum)
Pt resting in bed quietly. Easily aroused. Denies any pain at this time. nt tube to right nare and patent with vital 1.5 infusing at 80cc/hr with 200 cc water flush every 6 hours. Box cutter is found in pt's bed. Pt is educated and it is placed within a plastic bag within the pt's specific drawer to be returned upon discharge for safety concerns. Incontinent of b/b noted. Urinal offered this shift. Able to make needs known. callbell within reach. Will continue to monitor.

## 2017-04-30 ENCOUNTER — Inpatient Hospital Stay (HOSPITAL_COMMUNITY): Payer: Medicare Other | Admitting: Occupational Therapy

## 2017-04-30 ENCOUNTER — Inpatient Hospital Stay (HOSPITAL_COMMUNITY): Payer: Medicare Other | Admitting: Physical Therapy

## 2017-04-30 LAB — COOXEMETRY PANEL
CARBOXYHEMOGLOBIN: 1.3 % (ref 0.5–1.5)
METHEMOGLOBIN: 1 % (ref 0.0–1.5)
O2 SAT: 60.4 %
Total hemoglobin: 9.6 g/dL — ABNORMAL LOW (ref 12.0–16.0)

## 2017-04-30 LAB — GLUCOSE, CAPILLARY
GLUCOSE-CAPILLARY: 130 mg/dL — AB (ref 65–99)
GLUCOSE-CAPILLARY: 140 mg/dL — AB (ref 65–99)
GLUCOSE-CAPILLARY: 141 mg/dL — AB (ref 65–99)
Glucose-Capillary: 102 mg/dL — ABNORMAL HIGH (ref 65–99)
Glucose-Capillary: 96 mg/dL (ref 65–99)
Glucose-Capillary: 118 mg/dL — ABNORMAL HIGH (ref 65–99)

## 2017-04-30 LAB — MAGNESIUM: MAGNESIUM: 2.4 mg/dL (ref 1.7–2.4)
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
## 2017-04-30 NOTE — Progress Notes (Signed)
  [facility] PHYSICAL MEDICINE & REHABILITATION     PROGRESS NOTE    Subjective/Complaints:  Patient denies pain.  He denies shortness of breath.  He does admit to feeling weak at times.  He states he is trying to eat more food. Objective: Vital Signs: Blood pressure 133/65, pulse 84, temperature 97.6 F (36.4 C), temperature source Oral, resp. rate 18, weight 201 lb 15.1 oz (91.6 kg), SpO2 93 %.  Elderly male in no acute distress.  Chest clear to auscultation without any increased work of breathing.  Cardiac exam S1 and S2 are regular.  He does have a sternal scar without erythema or discharge.  Abdominal exam active bowel sounds, soft.  Extremities without edema.  He does have a nasal feeding tube.  Assessment/Plan: 1.  Functional deficits and decreased mobility secondary to debility after multiple medical complications   Medical Problem List and Plan: 1.   Debility secondary to coronary artery disease and left ventricular rupture with acute systolic heart failure   -Tolerating therapies. 2. DVT Prophylaxis/Anticoagulation: Pharmaceutical:Lovenox 3. Pain Management: Use acetaminophen as needed 4. Mood:[name] to follow for evaluation and support. 5. Neuropsych: This patientis not fullycapable of making decisions on hisown behalf. 6. Skin/Wound Care:Monitor surgical wounds daily for healing .\ 7. Fluids/Electrolytes/Nutrition: Basic Metabolic Panel:    Component Value Date/Time   NA 140 04/29/2017 0848   K 4.0 04/29/2017 0848   CL 106 04/29/2017 0848   CO2 29 04/29/2017 0848   BUN 21 (H) 04/29/2017 0848   CREATININE 0.66 04/29/2017 0848   GLUCOSE 113 (H) 04/29/2017 0848   CALCIUM 8.5 (L) 04/29/2017 0848    8. ESBL E coli (penile meatus): complete meropenam today 9. Hypokalemia: resolved. 10.Anoxic BI? /Delirium: Continue Seroquel and wean over next few days.    Mental status change that nurse noted may be related i'll check labs and cxr today (see above) CBC:     Component Value Date/Time   WBC 14.1 (H) 04/29/2017 0848   HGB 9.4 (L) 04/29/2017 0848   HCT 31.0 (L) 04/29/2017 0848   PLT 596 (H) 04/29/2017 0848   MCV 88.8 04/29/2017 0848   NEUTROABS 12.2 (H) 04/28/2017 0444   LYMPHSABS 1.1 04/28/2017 0444   MONOABS 1.3 (H) 04/28/2017 0444   EOSABS 0.4 04/28/2017 0444   BASOSABS 0.1 04/28/2017 0444     11. Leukocytosis: Continue to monitor for trends -20.0 -->22.1-->17.1>>15. now 14.1 Has been afebrile  Was treated for ESBL with meropenem.  Portable x-ray on November 29 and others have documented airspace disease.  He has been on vancomycin.  He has been afebrile and I think it is worth discontinuing the vancomycin at this time. 12. PAF: In NSR on amiodarone 200 mg bid and coreg addedon11/29 13. COPD with chronic hypoxic/hypercarbic failure:Requiring 4 L oxygen per [place]--wean as able. Encouraging IS 14. Acute systolic heart failure due to cardiogenic shock: EF improved to 40%. No lasix. Monitor weight for signs of overload. Check weights daily.  15. Hypokalemia: Lab Results  Component Value Date   K 4.0 04/29/2017     Continue twice daily supplementation and recheck labs on Monday. 16. ABLA: Monitor H/H serially.  9.4 today   LOS (Days) 3 A FACE TO FACE EVALUATION WAS PERFORMED  Lindley MagnusBruce H Roque Schill, MD 04/30/2017 8:47 AM

## 2017-04-30 NOTE — Progress Notes (Signed)
Physical Therapy Note  Patient Details  Name: [name] MRN: [id number] Date of Birth: November 02, 1950 Today's Date: 04/30/2017    Time: 800-905 65 minutes  1:1 No c/o pain. Pt confused this morning, continues to believe he is at a nuclear facility for radiation. Pt reluctant to participate with PT but willing to get up with encouragement and to eat breakfast.  pt requires min A for supine to sit.  Min A for scoot pivot and stand pivot transfer with RW throughout session with cues for UE placement.  W/c mobility with bilat LEs with min/mod A x 25'.  Pt ate breakfast of purred fruit and magic cup with cues for small bites and throat clearing.  Gait training with RW 10' x 2, 15' with min A, prolonged rests due to anxiety.  Step ups to 4'' step 3 x 5 with RW with min A.  Pt gaining more confidence throughout session.  standing tolerance 3 x 30 seconds with cues for deep breathing.  Pt left in bed with needs at hand.    Kazuma Elena 04/30/2017, 9:04 AM

## 2017-05-01 ENCOUNTER — Inpatient Hospital Stay (HOSPITAL_COMMUNITY): Payer: Medicare Other | Admitting: Physical Therapy

## 2017-05-01 ENCOUNTER — Inpatient Hospital Stay (HOSPITAL_COMMUNITY): Payer: Medicare Other | Admitting: Occupational Therapy

## 2017-05-01 ENCOUNTER — Other Ambulatory Visit: Payer: Self-pay

## 2017-05-01 ENCOUNTER — Telehealth (HOSPITAL_COMMUNITY): Payer: Self-pay

## 2017-05-01 ENCOUNTER — Inpatient Hospital Stay (HOSPITAL_COMMUNITY): Payer: Medicare Other | Admitting: Speech Pathology

## 2017-05-01 ENCOUNTER — Encounter (HOSPITAL_COMMUNITY): Payer: Self-pay

## 2017-05-01 DIAGNOSIS — G931 Anoxic brain damage, not elsewhere classified: Secondary | ICD-10-CM

## 2017-05-01 DIAGNOSIS — E876 Hypokalemia: Secondary | ICD-10-CM

## 2017-05-01 DIAGNOSIS — D72829 Elevated white blood cell count, unspecified: Secondary | ICD-10-CM

## 2017-05-01 DIAGNOSIS — R739 Hyperglycemia, unspecified: Secondary | ICD-10-CM

## 2017-05-01 DIAGNOSIS — D62 Acute posthemorrhagic anemia: Secondary | ICD-10-CM

## 2017-05-01 LAB — GLUCOSE, CAPILLARY
GLUCOSE-CAPILLARY: 123 mg/dL — AB (ref 65–99)
GLUCOSE-CAPILLARY: 102 mg/dL — AB (ref 65–99)
Glucose-Capillary: 115 mg/dL — ABNORMAL HIGH (ref 65–99)
Glucose-Capillary: 88 mg/dL (ref 65–99)
Glucose-Capillary: 89 mg/dL (ref 65–99)
Glucose-Capillary: 94 mg/dL (ref 65–99)

## 2017-05-01 LAB — BASIC METABOLIC PANEL
Anion gap: 4 — ABNORMAL LOW (ref 5–15)
BUN: 24 mg/dL — AB (ref 6–20)
CALCIUM: 8.3 mg/dL — AB (ref 8.9–10.3)
CO2: 31 mmol/L (ref 22–32)
CREATININE: 0.66 mg/dL (ref 0.61–1.24)
Chloride: 105 mmol/L (ref 101–111)
GFR calc Af Amer: >60 mL/min (ref >60)
Glucose, Bld: 125 mg/dL — ABNORMAL HIGH (ref 65–99)
POTASSIUM: 4.2 mmol/L (ref 3.5–5.1)
SODIUM: 140 mmol/L (ref 135–145)

## 2017-05-01 LAB — CBC
HCT: 30.3 % — ABNORMAL LOW (ref 39.0–52.0)
Hemoglobin: 9.2 g/dL — ABNORMAL LOW (ref 13.0–17.0)
MCH: 27.2 pg (ref 26.0–34.0)
MCHC: 30.4 g/dL (ref 30.0–36.0)
MCV: 89.6 fL (ref 78.0–100.0)
PLATELETS: 592 10*3/uL — AB (ref 150–400)
RBC: 3.38 MIL/uL — AB (ref 4.22–5.81)
RDW: 15.6 % — ABNORMAL HIGH (ref 11.5–15.5)
WBC: 13.9 10*3/uL — ABNORMAL HIGH (ref 4.0–10.5)

## 2017-05-01 LAB — CULTURE, BLOOD (ROUTINE X 2)
CULTURE: NO GROWTH
Culture: NO GROWTH
SPECIAL REQUESTS: ADEQUATE
SPECIAL REQUESTS: ADEQUATE

## 2017-05-01 LAB — MAGNESIUM: Magnesium: 2.3 mg/dL (ref 1.7–2.4)

## 2017-05-01 LAB — COOXEMETRY PANEL
CARBOXYHEMOGLOBIN: 1.1 % (ref 0.5–1.5)
METHEMOGLOBIN: 1.1 % (ref 0.0–1.5)
O2 Saturation: 59 %
Total hemoglobin: 9.4 g/dL — ABNORMAL LOW (ref 12.0–16.0)

## 2017-05-01 MED ORDER — LOSARTAN POTASSIUM 50 MG PO TABS
25.0000 mg | ORAL_TABLET | Freq: Two times a day (BID) | ORAL | Status: DC
  Administered 2017-05-01 – 2017-05-07 (×12): 25 mg via ORAL
  Filled 2017-05-01 (×12): qty 1

## 2017-05-01 NOTE — Progress Notes (Signed)
Occupational Therapy Session Note  Patient Details  Name: [name] MRN: [id number] Date of Birth: [birth year]  Today's Date: 05/01/2017 OT Individual Time: 1500-1540 OT Individual Time Calculation (min): 40 min  and Today's Date: 05/01/2017 OT Missed Time: 20 Minutes Missed Time Reason: Patient unwilling/refused to participate without medical reason   Short Term Goals: Week 1:  OT Short Term Goal 1 (Week 1): Pt will be able to sit >< stand with min A to prepare for LB dressing. OT Short Term Goal 2 (Week 1): Pt will be able to transfer on and off toilet with min A. OT Short Term Goal 3 (Week 1): Pt will demonstrate improved dynamic standing balance to be able to reach around to cleanse bottom with min A. OT Short Term Goal 4 (Week 1): Pt will don pants with min A. OT Short Term Goal 5 (Week 1): Pt will stay focused on ADL task with min cues.  Skilled Therapeutic Interventions/Progress Updates:    Upon entering the room, pt exiting the bathroom with NT via wheelchair. Pt with no c/o pain this session. Pt required encouragement for participation this session. Pt seated in wheelchair at sink to wash face with set up A and suction brush hooked up for pt to brush teeth as well. Pt with perseverating on looking for wallet. OT attempted to redirect with max multimodal cues but unable to fully do so. Pt returning to bed with min A stand pivot transfer while maintaining sternal precautions. OT assisted pt with B LE's into bed and positioned pt in bed for comfort. Pt in bed with bed alarm activated and call bell within reach. Pt attempting to call sister about wallet but unable to figure out how to use cell phone. Pt completely dissembled phone and able to reassemble with cues and increased time. Pt declined therapist assistance to call his sister for him. Visitor arriving in room and pt declined further OT intervention at this time.    Therapy Documentation Precautions:  Precautions Precautions:  Sternal, Fall Restrictions Weight Bearing Restrictions: Yes Other Position/Activity Restrictions: Sternal precautions General: General OT Amount of Missed Time: 20 Minutes Vital Signs: Therapy Vitals Temp: 97.9 F (36.6 C) Temp Source: Oral Pulse Rate: 78 Resp: 18 BP: (!) 144/50 Patient Position (if appropriate): Lying Oxygen Therapy SpO2: 95 % O2 Device: Not Delivered Pain:   ADL: ADL ADL Comments: Refer to functional navigator Vision   Perception    Praxis   Exercises:   Other Treatments:    See Function Navigator for Current Functional Status.   Therapy/Group: Individual Therapy  Alen BleacherBradsher, Merrell Borsuk P 05/01/2017, 4:00 PM

## 2017-05-01 NOTE — Progress Notes (Signed)
Social Work Patient ID: [name], male   DOB: Dec 19, 1950, 66 y.o.   MRN: [id number]   Continue to try to reach pt's sister, Ronald EvansCatherine, to complete assessment as pt remains confused and difficult to understand.    Wendelin Bradt, [name]

## 2017-05-01 NOTE — Telephone Encounter (Signed)
Patient's insurance is active and benefits verified through Medicare Part B - No co-pay, deductible amount of $183.00/$183.00 has been met, no out of pocket, 20% co-insurance, and no pre-authorization is required. Passport/reference [phone]  Patient's insurance is active and benefits verified through Medicaid - No co-pay, no deductible, no out of pocket, no co-insurance, and no pre-authorization is required. Passport/reference [phone]  Patient will be contacted and scheduled after their follow up appt with the Cardiologist office upon review by the RN Navigator.

## 2017-05-01 NOTE — Progress Notes (Signed)
Advanced Heart Failure Rounding Note  Subjective:   Denies pain. Says he sees his brother in the room.   Objective:   Weight Range: 201 lb 4.5 oz (91.3 kg) Body mass index is 31.52 kg/m.   Vital Signs:   Temp:  [97.7 F (36.5 C)-98.4 F (36.9 C)] 98.4 F (36.9 C) (12/03 0309) Pulse Rate:  [81-82] 82 (12/03 0309) Resp:  [18-20] 20 (12/03 0309) BP: (128-156)/(49-95) 132/49 (12/03 0700) SpO2:  [90 %-95 %] 93 % (12/03 0309) Weight:  [201 lb 4.5 oz (91.3 kg)] 201 lb 4.5 oz (91.3 kg) (12/03 0309) Last BM Date: 04/28/17  Weight change: Filed Weights   04/29/17 0350 04/30/17 0410 05/01/17 0309  Weight: 197 lb 8.5 oz (89.6 kg) 201 lb 15.1 oz (91.6 kg) 201 lb 4.5 oz (91.3 kg)    Intake/Output:   Intake/Output Summary (Last 24 hours) at 05/01/2017 0833 Last data filed at 05/01/2017 0317 Gross per 24 hour  Intake 0 ml  Output 450 ml  Net -450 ml      Physical Exam   General:  Appears chronically ill.  No resp difficulty HEENT: normal except cortack  Neck: supple. no JVD. Carotids 2+ bilat; no bruits. No lymphadenopathy or thryomegaly appreciated. Cor: PMI nondisplaced. Regular rate & rhythm. No rubs, gallops or murmurs. Lungs: Decreased throughout.  Abdomen: soft, nontender, nondistended. No hepatosplenomegaly. No bruits or masses. Good bowel sounds. Extremities: no cyanosis, clubbing, rash, edema Neuro: alert &1 self only. , cranial nerves grossly intact. moves all 4 extremities w/o difficulty. Affect pleasant  Telemetry   Not connected  EKG    N/A  Labs    CBC Recent Labs    04/29/17 0848 05/01/17 0342  WBC 14.1* 13.9*  HGB 9.4* 9.2*  HCT 31.0* 30.3*  MCV 88.8 89.6  PLT 596* 592*   Basic Metabolic Panel Recent Labs    16/02/9611/01/18 0848 04/30/17 0441 05/01/17 0342  NA 140  --  140  K 4.0  --  4.2  CL 106  --  105  CO2 29  --  31  GLUCOSE 113*  --  125*  BUN 21*  --  24*  CREATININE 0.66  --  0.66  CALCIUM 8.5*  --  8.3*  MG  --  2.4 2.3    Liver Function Tests Recent Labs    04/29/17 0848  AST 36  ALT 50  ALKPHOS 93  BILITOT 0.8  PROT 6.0*  ALBUMIN 2.3*   No results for input(s): LIPASE, AMYLASE in the last 72 hours. Cardiac Enzymes No results for input(s): CKTOTAL, CKMB, CKMBINDEX, TROPONINI in the last 72 hours.  BNP: BNP (last 3 results) No results for input(s): BNP in the last 8760 hours.  ProBNP (last 3 results) No results for input(s): PROBNP in the last 8760 hours.   D-Dimer No results for input(s): DDIMER in the last 72 hours. Hemoglobin A1C No results for input(s): HGBA1C in the last 72 hours. Fasting Lipid Panel No results for input(s): CHOL, HDL, LDLCALC, TRIG, CHOLHDL, LDLDIRECT in the last 72 hours. Thyroid Function Tests No results for input(s): TSH, T4TOTAL, T3FREE, THYROIDAB in the last 72 hours.  Invalid input(s): FREET3  Other results:   Imaging    No results found.   Medications:     Scheduled Medications: . amiodarone  200 mg Per Tube BID  . aspirin  81 mg Per Tube Daily  . atorvastatin  80 mg Per Tube q1800  . carvedilol  3.125 mg Oral  BID WC  . Chlorhexidine Gluconate Cloth  6 each Topical Daily  . enoxaparin (LOVENOX) injection  40 mg Subcutaneous Q24H  . feeding supplement (PRO-STAT SUGAR FREE 64)  30 mL Per Tube BID  . free water  200 mL Per Tube Q6H  . insulin aspart  0-24 Units Subcutaneous Q4H  . losartan  25 mg Oral QHS  . mouth rinse  15 mL Mouth Rinse BID  . pantoprazole sodium  40 mg Per Tube Daily  . QUEtiapine  50 mg Per Tube BID  . sennosides  10 mL Oral QHS  . sodium chloride flush  10-40 mL Intracatheter Q12H  . ticagrelor  90 mg Per Tube BID    Infusions: . feeding supplement (VITAL 1.5 CAL) Stopped (04/29/17 0337)    PRN Medications: acetaminophen, albuterol, alum & mag hydroxide-simeth, bisacodyl, diphenhydrAMINE, Gerhardt's butt cream, guaiFENesin-dextromethorphan, prochlorperazine **OR** prochlorperazine **OR** prochlorperazine, RESOURCE  THICKENUP CLEAR, sodium chloride flush, sodium phosphate, traZODone    Patient Profile   [name] is 66 year old with h/o MI, HTN, COPD admitted with chest pain. EKG with evidence of inferior/lateral STEMI. Due to lethargy and hypoxemia he was intubated in the ED. Taken to cath lab with RCA DES and POBA to chronically occluded LADwith IABP placed.  Had LV pseudo aneurysm repair 11/14 .   Assessment/Plan   1. CAD with OOH anterior MI c/b LV pseudoaneurysm and cardiogenic shock - attempted PCI of LAD failed due to chronic occlusion and extensive infarct - s/p PCI/DES to distal RCA Remains on ASA, brilinta, atorvastatin - No s/s of ischemia.    2.  S/P LV pseudoaneurysm repair 11/14 3. Chronic systolic HF-> cardiogenic shock resolved - EF improved 25%->40% - Volume status stable on exam.  - Not on lasix.  -Increase losartan to 25 mg twice a day.  - Continue coreg 3.125 mg BID - Renal function stable.  4. COPD with chronic CO2 retention  - Stable. No wheeze.   5. ESBL E coli culture -04/15/17 ESBL from skin culture (penile meatus) - Completed antibiotic course. Continue vanc x 4 days from stopping meropenem will finish days of vanc.  WBC 13.8   6. PAF - Regular pulse.  amiodarone 200 mg BID.   7. Hypokalemia - K 4.2. Stable.    8. Severe Deconditioning Admitted to CIR 04/27/17. Appreciate your management of this patient.   Medication concerns reviewed with patient and pharmacy team. Barriers identified: None at this time.   Length of Stay: 4  Ronald Clegg, NP  05/01/2017, 8:33 AM  Advanced Heart Failure Team Pager [phone] (M-F; 7a - 4p)  Please contact [facility] for night-coverage after hours (4p -7a ) and weekends on [email or web address]   Patient seen and examined with Ronald BecketAmy Clegg, NP. We discussed all aspects of the encounter. I agree with the assessment and plan as stated above.   Stable from CAD and HF perspective but appears to have significant anoxic brain  injury. Volume status looks good. Agree with increasing losartan. Would be careful not to lower BP too much . We will several times per week for now. Please call if needed.Marland Kitchen. Appreciate CIR's care.   Ronald Meresaniel Bensimhon, MD  9:10 AM    .

## 2017-05-01 NOTE — Progress Notes (Signed)
Nutrition Follow-up  DOCUMENTATION CODES:   Obesity unspecified  INTERVENTION:  Discontinue calorie count.  Nocturnal tube feeds using Vital 1.5 formula at new goal rate of 80 ml/hr x 10 hours (6pm-4am) with 30 ml Prostat BID per tube to provide 1400 kcal (70% of kcal needs), 82 grams of protein (82% of protein needs), 608 ml of water.   Continue free water flushes of 200 ml q 6 hours. Total water: 1408 ml/day.  Provide Magic cup TID with meals, each supplement provides 290 kcal and 9 grams of protein.  NUTRITION DIAGNOSIS:   Inadequate oral intake related to dysphagia as evidenced by meal completion < 50%; ongoing  GOAL:   Patient will meet greater than or equal to 90% of their needs; progressing  MONITOR:   TF tolerance, PO intake, Supplement acceptance, Diet advancement, Weight trends, Labs, Skin, I & O's  REASON FOR ASSESSMENT:   Consult Calorie Count, Enteral/tube feeding initiation and management  ASSESSMENT:    66 y.o. male with history of CAD, HTN, COPD, chronic neck pain;  who was admitted on 04/11/17 with SOB and malaise due to inferior lateral STEMI, lethargy and cardiogenic shock. He was taken to OR for CABG with resection of LV aneurysm with placement of impella 11/14 and underwent chest closure on 11/17.  11/19 Impella removed  Pt continues on a dysphagia 1 diet with pudding thick liquids. Meal completion has been varied from 0-30% with 0% intake this AM and last night. One meal ticket was saved from the weekend with only 290 kcal and 9 grams of protein consumed (magic cup). RD to discontinue calorie count. Pt reports dislike of pureed diet. Family at bedside are willing to bring in pureed home cooked meals for pt to consume to try to encouraged pt po intake. Pt reports tolerating his tube feedings at night. RD to continue with current orders.   Labs and medications reviewed.   Diet Order:  DIET - DYS 1 Room service appropriate? Yes; Fluid consistency: Pudding  Thick  EDUCATION NEEDS:   Not appropriate for education at this time  Skin:  Skin Assessment: Reviewed RN Assessment  Last BM:  11/30  Height:   Ht Readings from Last 1 Encounters:  05/01/17 5\' 7"  (1.702 m)    Weight:   Wt Readings from Last 1 Encounters:  05/01/17 201 lb 4.5 oz (91.3 kg)    Ideal Body Weight:  67.27 kg  BMI:  Body mass index is 31.52 kg/m.  Estimated Nutritional Needs:   Kcal:  2000-2200  Protein:  100-115  Fluid:  >/= 2 L/day    Roslyn SmilingStephanie Polina Burmaster, MS, RD, LDN Pager # [phone] After hours/ weekend pager # [phone]

## 2017-05-01 NOTE — Progress Notes (Addendum)
Physical Therapy Note  Patient Details  Name: Arlana HoveGary Grenfell MRN: [id number] Date of Birth: 1950/11/01 Today's Date: 05/01/2017    Time: 1030-1115 45 minutes  1:1 No c/o pain.  Pt willing to get up with max encouragement.  Pt performs bed mobility with min A, increased time.  Sit to stand with min A with RW, donned pants with mod A.  Stand pivot transfers during session with min A.  activity and out of bed tolerance x 20 minutes playing checkers with min/mod cues for rules and attention to task.  W/c mobility with bilat LEs x 50' with min A, cues for sequencing. Pt states "my brother died and I have to get his things settled".  Pt perseverative on his brother's death, difficult to redirect.  Time: 1345-1415 30 minutes  1:1 No c/o pain.  Pt willing to get out of bed with family present. Pt performs bed mobility min A.  Transfers and gait with supervision with RW, cues for UE placement. Pt able to gait 15' x 2, 50' in controlled environment.  nustep for LE strengthening with LEs only x 6 minutes level 3.   DONAWERTH,KAREN 05/01/2017, 11:30 AM

## 2017-05-01 NOTE — Progress Notes (Signed)
Speech Language Pathology Daily Session Notes  Patient Details  Name: [name] MRN: [id number] Date of Birth: [birth year]  Today's Date: 05/01/2017  Session 1: SLP Individual Time: 8295-62130930-0940 SLP Individual Time Calculation (min): 10 min   Session 1: SLP Individual Time: 0865-78461210-1235 SLP Individual Time Calculation (min): 25 min  Short Term Goals: Week 1: SLP Short Term Goal 1 (Week 1): Pt will consume therapeutic trials of ice chips with minimal overt s/s of aspiration over 3 consecutive sessions prior to repeat objective assessment.   SLP Short Term Goal 2 (Week 1): Pt will sustain his attention to functional tasks for 5 minutes with min verbal cues cor redirection.   SLP Short Term Goal 3 (Week 1): Pt will maintain a topic of conversation for at least 5 turns with min verbal cues.    SLP Short Term Goal 4 (Week 1): Pt will complete basic, functional tasks with min verbal cues for problem solving.   SLP Short Term Goal 5 (Week 1): Pt will consume dys 1 textures and pudding thick liquids with supervision cues for use of swallowing precautions.   SLP Short Term Goal 6 (Week 1): Pt will return demonstration of at least 2 safety precautions with mod assist verbal cues.    Skilled Therapeutic Interventions:  Session 1: Skilled treatment session focused on cognitive and dysphagia goals. Upon arrival, patient appeared lethargic while supine in bed. Patient appeared more confused today based on previous notes 2/2 reporting his brother had died today. Patient was perseverative on topic and reported he was unable to eat because he was worried about his family. Patient also appeared to have a wet vocal quality with constant throat clearing throughout conversation and reported he felt "pudding was stuck in his throat."  (medications?)SLP cued patient on utilizing multiple swallows which appeared to help decrease throat clearing. Patient was also falling asleep throughout conversation. Patient missed  remaining 50 minutes of session, will re-attempt as schedule allows. Patient left supine in bed with all needs within reach. Continue with current plan of care.   Session 2: Skilled treatment session focused on cognitive and dysphagia goals. Upon arrival, patient was sitting EOB and appeared more alert. Patient offered lunch but declined due to being "too anxious" to eat. Patient continued to perseverate on his brother dying and needing to call his lawyer. Patient reported, I know you all think I am crazy but I'm not. Patient also requested the hospital stop "blocking" his calls so that he could call his lawyer. SLP provided Max A multimodal cues with problem solving with phone use. Patient's sister arrived in session and confirmed that the patient's brother was in fact alive and doing well but required reinforcement of information. Of note, patient's voice appeared louder and clearer this session compared to this morning. Patient left upright in bed with family present. Continue with current plan of care.   Function:   Cognition Comprehension Comprehension assist level: Understands basic 75 - 89% of the time/ requires cueing 10 - 24% of the time  Expression   Expression assist level: Expresses basic 75 - 89% of the time/requires cueing 10 - 24% of the time. Needs helper to occlude trach/needs to repeat words.  Social Interaction Social Interaction assist level: Interacts appropriately 50 - 74% of the time - May be physically or verbally inappropriate.  Problem Solving Problem solving assist level: Solves basic 50 - 74% of the time/requires cueing 25 - 49% of the time  Memory Memory assist level: Recognizes or recalls  50 - 74% of the time/requires cueing 25 - 49% of the time    Pain No/Denies Pain   Therapy/Group: Individual Therapy  [name] 05/01/2017, 2:48 PM

## 2017-05-01 NOTE — Progress Notes (Signed)
[facility] PHYSICAL MEDICINE & REHABILITATION     PROGRESS NOTE    Subjective/Complaints: Pt seen sitting up in his chair this AM.  He slept well overnight.  He has questions about his stoma.  ROS: Denies CP, SOB, N/V/D.  Objective: Vital Signs: Blood pressure (!) 132/49, pulse 82, temperature 98.4 F (36.9 C), temperature source Oral, resp. rate 20, weight 91.3 kg (201 lb 4.5 oz), SpO2 93 %. Dg Chest Port 1v Same Day  Result Date: 04/29/2017 CLINICAL DATA:  Confusion EXAM: PORTABLE CHEST 1 VIEW COMPARISON:  04/27/2017 FINDINGS: Small bilateral pleural effusions. Bibasilar atelectasis or infiltrates, left greater than right. Cardiomegaly. No overt edema. Right PICC line remains in place, unchanged. IMPRESSION: Small bilateral pleural effusions with bibasilar atelectasis or infiltrates, left greater than right. Cardiomegaly. Electronically Signed   By: Charlett NoseKevin  Dover M.D.   On: 04/29/2017 11:46   Recent Labs    04/29/17 0848 05/01/17 0342  WBC 14.1* 13.9*  HGB 9.4* 9.2*  HCT 31.0* 30.3*  PLT 596* 592*   Recent Labs    04/29/17 0848 05/01/17 0342  NA 140 140  K 4.0 4.2  CL 106 105  GLUCOSE 113* 125*  BUN 21* 24*  CREATININE 0.66 0.66  CALCIUM 8.5* 8.3*   CBG (last 3)  Recent Labs    04/30/17 2342 05/01/17 0404 05/01/17 0844  GLUCAP 141* 123* 94    Wt Readings from Last 3 Encounters:  05/01/17 91.3 kg (201 lb 4.5 oz)  04/27/17 88.3 kg (194 lb 10.7 oz)    Physical Exam:  Constitutional: He appearswell-developedand well-nourished.No distress. HENT: Normocephalicand atraumatic.  Eyes:EOMare normal. No discharge.  Cardiovascular:Normal rate. +murmur. No JVD Respiratory:Lungs clear. Unlabored.  UJ:WJXBJGI:Bowel sounds are normal. He exhibitsno distension.  +NG Musculoskeletal: He exhibits no edema, no tenderness. Neurological: He isalert. He is frequently tangential and goes off topic.   He can follow simple commands.   Motor: B/l UE 4-/5 proximal to  distal  B/l LE: HF 2/5, KE 3-/5, ADF/PF 4-/5 Skin iswarmand dry. He isnot diaphoretic.Sternal incision is intact.   Psychiatric: Pleasant  Assessment/Plan: 1.  Functional deficits and decreased mobility secondary to debility after multiple medical complications which require 3+ hours per day of interdisciplinary therapy in a comprehensive inpatient rehab setting. Physiatrist is providing close team supervision and 24 hour management of active medical problems listed below. Physiatrist and rehab team continue to assess barriers to discharge/monitor patient progress toward functional and medical goals.  Function:  Bathing Bathing position   Position: Sitting EOB  Bathing parts Body parts bathed by patient: Right arm, Left arm, Chest, Abdomen, Front perineal area, Right upper leg, Left upper leg Body parts bathed by helper: Buttocks, Right lower leg, Left lower leg, Back  Bathing assist        Upper Body Dressing/Undressing Upper body dressing   What is the patient wearing?: Hospital gown                Upper body assist        Lower Body Dressing/Undressing Lower body dressing   What is the patient wearing?: Pants, Non-skid slipper socks     Pants- Performed by patient: Thread/unthread right pants leg, Thread/unthread left pants leg Pants- Performed by helper: Pull pants up/down, Fasten/unfasten pants   Non-skid slipper socks- Performed by helper: Don/doff right sock, Don/doff left sock                  Lower body assist Assist for lower body dressing: Touching  or steadying assistance (Pt > 75%)      Toileting Toileting     Toileting steps completed by helper: Adjust clothing prior to toileting, Performs perineal hygiene, Adjust clothing after toileting Toileting Assistive Devices: Grab bar or rail, Prosthesis/orthosis  Toileting assist Assist level: Touching or steadying assistance (Pt.75%)   Transfers Chair/bed transfer   Chair/bed transfer method:  Stand pivot Chair/bed transfer assist level: Moderate assist (Pt 50 - 74%/lift or lower)       Locomotion Ambulation     Max distance: 10 Assist level: Touching or steadying assistance (Pt > 75%)   Wheelchair   Type: Manual Max wheelchair distance: 15 Assist Level: Touching or steadying assistance (Pt > 75%)  Cognition Comprehension Comprehension assist level: Follows complex conversation/direction with no assist  Expression Expression assist level: Expresses complex ideas: With no assist  Social Interaction Social Interaction assist level: Interacts appropriately with others - No medications needed.  Problem Solving Problem solving assist level: Solves basic 50 - 74% of the time/requires cueing 25 - 49% of the time  Memory Memory assist level: Recognizes or recalls 50 - 74% of the time/requires cueing 25 - 49% of the time   Medical Problem List and Plan: 1.   Debility secondary to coronary artery disease and left ventricular rupture with acute systolic heart failure  Cont CIR  Notes reviwewed 2. DVT Prophylaxis/Anticoagulation: Pharmaceutical:Lovenox 3. Pain Management: Use acetaminophen as needed 4. Mood:[name] to follow for evaluation and support. 5. Neuropsych: This patientis not fullycapable of making decisions on hisown behalf. 6. Skin/Wound Care:Monitor surgical wounds daily for healing . 7. Fluids/Electrolytes/Nutrition:Monitor I/O. Added water flushes qid. Changed tube feeds to evening to assist with appetite. Does not like pureed foods. 8. ESBL E coli (penile meatus): Completed meropenem and Vancomycin  9. Hypokalemia: Being Managed with runs of K+. Added supplement via PEG.  K+ 4.2 on 12/3  Con to monitor 10.Anoxic BI: Continue Seroquel and wean over next few days.   Establish better sleep wake cycle 11. Leukocytosis: Continue to monitor for trends   Afebrile   WBCs 13. 9 on 12/3  Cont to monitor 12. PAF: In NSR on amiodarone 200 mg bid and coreg  addedon11/29 13. COPD with chronic hypoxic/hypercarbic failure:  Wean supplemental O2 as able. Encouraging IS 14. Acute systolic heart failure due to cardiogenic shock: EF improved to 40%. No lasix. Monitor weight for signs of overload. Check weights daily.  Filed Weights   04/29/17 0350 04/30/17 0410 05/01/17 0309  Weight: 89.6 kg (197 lb 8.5 oz) 91.6 kg (201 lb 15.1 oz) 91.3 kg (201 lb 4.5 oz)   15. ABLA: Monitor H/H serially.    Hb 9.2 on 12/3  Cont to monitor 16. Hyperglycemia   Likely related to TFs  Cont to monitor   LOS (Days) 4 A FACE TO FACE EVALUATION WAS PERFORMED  Ankit Karis JubaAnil Patel, MD 05/01/2017 10:23 AM

## 2017-05-02 ENCOUNTER — Inpatient Hospital Stay (HOSPITAL_COMMUNITY): Payer: Medicare Other | Admitting: Physical Therapy

## 2017-05-02 ENCOUNTER — Inpatient Hospital Stay (HOSPITAL_COMMUNITY): Payer: Medicare Other | Admitting: Occupational Therapy

## 2017-05-02 ENCOUNTER — Inpatient Hospital Stay (HOSPITAL_COMMUNITY): Payer: Medicare Other | Admitting: Speech Pathology

## 2017-05-02 DIAGNOSIS — D62 Acute posthemorrhagic anemia: Secondary | ICD-10-CM | POA: Diagnosis not present

## 2017-05-02 DIAGNOSIS — G931 Anoxic brain damage, not elsewhere classified: Secondary | ICD-10-CM | POA: Diagnosis not present

## 2017-05-02 DIAGNOSIS — I2109 ST elevation (STEMI) myocardial infarction involving other coronary artery of anterior wall: Secondary | ICD-10-CM | POA: Diagnosis not present

## 2017-05-02 DIAGNOSIS — E872 Acidosis: Secondary | ICD-10-CM | POA: Diagnosis not present

## 2017-05-02 DIAGNOSIS — R410 Disorientation, unspecified: Secondary | ICD-10-CM

## 2017-05-02 DIAGNOSIS — I5022 Chronic systolic (congestive) heart failure: Secondary | ICD-10-CM | POA: Diagnosis not present

## 2017-05-02 DIAGNOSIS — R5381 Other malaise: Secondary | ICD-10-CM | POA: Diagnosis not present

## 2017-05-02 LAB — COOXEMETRY PANEL
CARBOXYHEMOGLOBIN: 1.8 % — AB (ref 0.5–1.5)
METHEMOGLOBIN: 0.8 % (ref 0.0–1.5)
O2 SAT: 85.7 %
TOTAL HEMOGLOBIN: 9.4 g/dL — AB (ref 12.0–16.0)

## 2017-05-02 LAB — GLUCOSE, CAPILLARY
GLUCOSE-CAPILLARY: 87 mg/dL (ref 65–99)
GLUCOSE-CAPILLARY: 108 mg/dL — AB (ref 65–99)
GLUCOSE-CAPILLARY: 123 mg/dL — AB (ref 65–99)
Glucose-Capillary: 127 mg/dL — ABNORMAL HIGH (ref 65–99)
Glucose-Capillary: 96 mg/dL (ref 65–99)

## 2017-05-02 LAB — MAGNESIUM: MAGNESIUM: 2.4 mg/dL (ref 1.7–2.4)

## 2017-05-02 NOTE — Progress Notes (Signed)
Physical Therapy Session Note  Patient Details  Name: [name] MRN: [id number] Date of Birth: 12/12/1950  Today's Date: 05/02/2017 PT Individual Time: 0934-1045 PT Individual Time Calculation (min): 71 min  Missed time: 19 min (pt fatigue)   Short Term Goals: Week 1:  PT Short Term Goal 1 (Week 1): pt will consistently perform transfers with min A PT Short Term Goal 2 (Week 1): pt will perform gait x 30' in controlled environment with min A  Skilled Therapeutic Interventions/Progress Updates:   Pt received in w/c w/ OT, OT reports increased fatigue this morning. Pt sats @ 95% on room air throughout OT session. Pt agreeable to therapy if he can eat his breakfast first, c/o knee soreness but tolerable. Assisted pt w/ eating breakfast w/ set-up assist and assistance w/ moving NG tube out of way to take bites. Worked on tolerance to sitting w/o back support while eating, however frequent rest breaks w/ back support while leaning back in w/c. Verbal cues to attend to task. Moderate increase in work of breathing while eating breakfast, O2 sat 90% on room air. Worked on tolerance to OOB and upright activity rest of session. Pt self-propelled w/c to day room using BLEs w/ Min assist for propulsion, 50' w/ O2 sat 88% afterwards. Put supplemental O2 back on at this point and O2 remained 93-95% throughout rest of session on 1 L O2/min. Performed 5x sit<>stand w/ Min assist to boost w/o functional use of UEs 2/2 sternal precautions. Transferred w/c<>NuStep using RW w/ Min guard. Performed NuStep 5 min x2 w/ rest in between bouts to work on endurance and LE strengthening, 15-17 on Bord RPE scale. Prolonged rest breaks in between bouts of activity 2/2 increased work of breathing. Verbal and visual cues for breathing techniques to return to baseline. Pt with difficulty returning to baseline breathing after 2nd bout of NuStep. Returned to room Total assist in w/c and transferred to EOB using RW w/ Min guard and  to supine w/ mod assist for LE management. O2 sat @ 98% on room air once in supine. Ended session in supine and in care of RN, all needs met. Missed 19 min of skilled PT 2/2 fatigue this session.   Therapy Documentation Precautions:  Precautions Precautions: Sternal, Fall Restrictions Weight Bearing Restrictions: (sternal precautions) Other Position/Activity Restrictions: Sternal precautions Vital Signs: Therapy Vitals Pulse Rate: 75 BP: 132/61 Patient Position (if appropriate): Lying Oxygen Therapy SpO2: 98 % O2 Device: Not Delivered O2 Flow Rate (L/min): 3 L/min  See Function Navigator for Current Functional Status.   Therapy/Group: Individual Therapy   K Arnette 05/02/2017, 10:51 AM  

## 2017-05-02 NOTE — Care Management Note (Signed)
[facility] Individual Statement of Services  Patient Name:  Ronald HoveGary [name]  Date:  05/02/2017  Welcome to the [facility].  Our goal is to provide you with an individualized program based on your diagnosis and situation, designed to meet your specific needs.  With this comprehensive rehabilitation program, you will be expected to participate in at least 3 hours of rehabilitation therapies Monday-Friday, with modified therapy programming on the weekends.  Your rehabilitation program will include the following services:  Physical Therapy (PT), Occupational Therapy (OT), Speech Therapy (ST), 24 hour per day rehabilitation nursing, Therapeutic Recreaction (TR), Neuropsychology, Case Management (Social Worker), Rehabilitation Medicine, Nutrition Services and Pharmacy Services  Weekly team conferences will be held on Wednesdays to discuss your progress.  Your Social Worker will talk with you frequently to get your input and to update you on team discussions.  Team conferences with you and your family in attendance may also be held.  Expected length of stay: 21-24 days  Overall anticipated outcome: supervision  Depending on your progress and recovery, your program may change. Your Social Worker will coordinate services and will keep you informed of any changes. Your Social Worker's name and contact numbers are listed  below.  The following services may also be recommended but are not provided by the [facility]:   Driving Evaluations  Home Health Rehabiltiation Services  Outpatient Rehabilitation Services    Arrangements will be made to provide these services after discharge if needed.  Arrangements include referral to agencies that provide these services.  Your insurance has been verified to be:  Medicare (B only) and Medicaid Your primary doctor is:  none  Pertinent information will be shared with your doctor and your insurance  company.  Social Worker:  [place]cy Maleka [name], [place] [phone] or ([phone]) [phone]   Information discussed with and copy given to patient by: Ronald JupiterHOYLE, [name], 04/28/2017, 1:57 PM

## 2017-05-02 NOTE — Progress Notes (Signed)
Occupational Therapy Session Note  Patient Details  Name: [name] MRN: [id number] Date of Birth: 1950/08/19  Today's Date: 05/02/2017 OT Individual Time: 1130-1200 OT Individual Time Calculation (min): 30 min    Short Term Goals: Week 1:  OT Short Term Goal 1 (Week 1): Pt will be able to sit >< stand with min A to prepare for LB dressing. OT Short Term Goal 2 (Week 1): Pt will be able to transfer on and off toilet with min A. OT Short Term Goal 3 (Week 1): Pt will demonstrate improved dynamic standing balance to be able to reach around to cleanse bottom with min A. OT Short Term Goal 4 (Week 1): Pt will don pants with min A. OT Short Term Goal 5 (Week 1): Pt will stay focused on ADL task with min cues.  Skilled Therapeutic Interventions/Progress Updates:    Upon entering the room, pt supine in bed and breathing rapidly as well as shallow. Pt with c/o, " I can't breathe". O2 saturation at 94% on room air. However, pt appearing to be very anxious about upcoming PICC removal. O2 [place] placed on pt but not turned on and pt began  breathing normally with focus on pursed lip breathing. Pt appearing to relax but continues to ask about PICC removal. Pt declined OOB therapy this session. Pt remained in bed at end of session with call bell and all needed items within reach upon exiting the room.   Therapy Documentation Precautions:  Precautions Precautions: Sternal, Fall Restrictions Weight Bearing Restrictions: (sternal precautions) Other Position/Activity Restrictions: Sternal precautions General: General PT Missed Treatment Reason: Patient fatigue Vital Signs: Oxygen Therapy SpO2: 98 % O2 Device: Not Delivered Pain: Pain Assessment Pain Assessment: ("my teeth hurt, they need to be pulled") ADL: ADL ADL Comments: Refer to functional navigator  See Function Navigator for Current Functional Status.   Therapy/Group: Individual Therapy  Alen BleacherBradsher, Krystal Delduca P 05/02/2017, 12:13 PM

## 2017-05-02 NOTE — Progress Notes (Signed)
Speech Language Pathology Daily Session Note  Patient Details  Name: [name] MRN: [id number] Date of Birth: 11/06/1950  Today's Date: 05/02/2017 SLP Individual Time: 1400-1410 SLP Individual Time Calculation (min): 10 min and Today's Date: 05/02/2017 SLP Missed Time: 20 Minutes Missed Time Reason: Patient unwilling to participate  Short Term Goals: Week 1: SLP Short Term Goal 1 (Week 1): Pt will consume therapeutic trials of ice chips with minimal overt s/s of aspiration over 3 consecutive sessions prior to repeat objective assessment.   SLP Short Term Goal 2 (Week 1): Pt will sustain his attention to functional tasks for 5 minutes with min verbal cues cor redirection.   SLP Short Term Goal 3 (Week 1): Pt will maintain a topic of conversation for at least 5 turns with min verbal cues.    SLP Short Term Goal 4 (Week 1): Pt will complete basic, functional tasks with min verbal cues for problem solving.   SLP Short Term Goal 5 (Week 1): Pt will consume dys 1 textures and pudding thick liquids with supervision cues for use of swallowing precautions.   SLP Short Term Goal 6 (Week 1): Pt will return demonstration of at least 2 safety precautions with mod assist verbal cues.    Skilled Therapeutic Interventions: Skilled treatment session focused on cognitive goals. Upon arrival, patient was asleep in bed but awakened easily. Patient reported he realized his body is tired and just needs to "rest" to heal. He also reported he was concerned about his recently increased anxiety and confusion. SLP provided education in regards to his current cognitive functioning, he verbalized understanding. Patient declined to get OOB and also declined a snack or trials of upgradeed textures despite encouragement. Therefore, patient missed remaining 20 minutes of session. Patient left supine in bed with alarm on. Continue with current plan of care.      Function:   Cognition Comprehension    Expression    Expression assist level: Expresses complex 90% of the time/cues < 10% of the time  Social Interaction Social Interaction assist level: Interacts appropriately 90% of the time - Needs monitoring or encouragement for participation or interaction.  Problem Solving Problem solving assist level: Solves basic 50 - 74% of the time/requires cueing 25 - 49% of the time  Memory Memory assist level: Recognizes or recalls 50 - 74% of the time/requires cueing 25 - 49% of the time    Pain No/Denies Pain   Therapy/Group: Individual Therapy  Xayne Brumbaugh 05/02/2017, 3:42 PM

## 2017-05-02 NOTE — Progress Notes (Signed)
[facility] PHYSICAL MEDICINE & REHABILITATION     PROGRESS NOTE    Subjective/Complaints: Pt seen laying in bed this AM.  He slept well overnight.  He states he probably needed supplemental O2 at home because he is [age] and his brother had it.   ROS: Denies CP, SOB, N/V/D.  Objective: Vital Signs: Blood pressure 132/61, pulse 75, temperature 99 F (37.2 C), temperature source Oral, resp. rate 16, height 5\' 7"  (1.702 m), weight 91.4 kg (201 lb 8.4 oz), SpO2 100 %. No results found. Recent Labs    05/01/17 0342  WBC 13.9*  HGB 9.2*  HCT 30.3*  PLT 592*   Recent Labs    05/01/17 0342  NA 140  K 4.2  CL 105  GLUCOSE 125*  BUN 24*  CREATININE 0.66  CALCIUM 8.3*   CBG (last 3)  Recent Labs    05/02/17 0001 05/02/17 0351 05/02/17 0835  GLUCAP 115* 127* 87    Wt Readings from Last 3 Encounters:  05/02/17 91.4 kg (201 lb 8.4 oz)  04/27/17 88.3 kg (194 lb 10.7 oz)    Physical Exam:  Constitutional: He appearswell-developedand well-nourished.No distress. HENT: Normocephalicand atraumatic.  Eyes:EOMare normal. No discharge.  Cardiovascular:Normal rate. +murmur. No JVD Respiratory:Lungs clear. Unlabored.  NF:AOZHYGI:Bowel sounds are normal. He exhibitsno distension.  +NG Musculoskeletal: He exhibits no edema, no tenderness. Neurological: He isalert. He can follow simple commands.   Motor: B/l UE 4-/5 proximal to distal  B/l LE: HF 2/5, KE 3-/5, ADF/PF 4-/5 (stable) Skin iswarmand dry. He isnot diaphoretic.Sternal incision is intact.   Psychiatric: Pleasant. Tangential  Assessment/Plan: 1.  Functional deficits and decreased mobility secondary to debility after multiple medical complications which require 3+ hours per day of interdisciplinary therapy in a comprehensive inpatient rehab setting. Physiatrist is providing close team supervision and 24 hour management of active medical problems listed below. Physiatrist and rehab team continue to assess barriers  to discharge/monitor patient progress toward functional and medical goals.  Function:  Bathing Bathing position   Position: Sitting EOB  Bathing parts Body parts bathed by patient: Right arm, Left arm, Chest, Abdomen, Front perineal area, Right upper leg, Left upper leg Body parts bathed by helper: Buttocks, Right lower leg, Left lower leg, Back  Bathing assist        Upper Body Dressing/Undressing Upper body dressing   What is the patient wearing?: Hospital gown                Upper body assist Assist Level: Supervision or verbal cues      Lower Body Dressing/Undressing Lower body dressing   What is the patient wearing?: Pants, Non-skid slipper socks     Pants- Performed by patient: Thread/unthread right pants leg, Thread/unthread left pants leg Pants- Performed by helper: Pull pants up/down, Fasten/unfasten pants   Non-skid slipper socks- Performed by helper: Don/doff right sock, Don/doff left sock                  Lower body assist Assist for lower body dressing: Touching or steadying assistance (Pt > 75%)      Toileting Toileting     Toileting steps completed by helper: Adjust clothing prior to toileting, Performs perineal hygiene, Adjust clothing after toileting Toileting Assistive Devices: Grab bar or rail, Prosthesis/orthosis  Toileting assist Assist level: Touching or steadying assistance (Pt.75%)   Transfers Chair/bed transfer   Chair/bed transfer method: Stand pivot Chair/bed transfer assist level: Touching or steadying assistance (Pt > 75%)  Locomotion Ambulation     Max distance: 10 Assist level: Touching or steadying assistance (Pt > 75%)   Wheelchair   Type: Manual Max wheelchair distance: 15 Assist Level: Touching or steadying assistance (Pt > 75%)  Cognition Comprehension Comprehension assist level: Understands basic 75 - 89% of the time/ requires cueing 10 - 24% of the time  Expression Expression assist level: Expresses  basic 75 - 89% of the time/requires cueing 10 - 24% of the time. Needs helper to occlude trach/needs to repeat words.  Social Interaction Social Interaction assist level: Interacts appropriately 50 - 74% of the time - May be physically or verbally inappropriate.  Problem Solving Problem solving assist level: Solves basic 50 - 74% of the time/requires cueing 25 - 49% of the time  Memory Memory assist level: Recognizes or recalls 50 - 74% of the time/requires cueing 25 - 49% of the time   Medical Problem List and Plan: 1.   Debility secondary to coronary artery disease and left ventricular rupture with acute systolic heart failure  Cont CIR 2. DVT Prophylaxis/Anticoagulation: Pharmaceutical:Lovenox 3. Pain Management: Use acetaminophen as needed 4. Mood:[name] to follow for evaluation and support. 5. Neuropsych: This patientis not fullycapable of making decisions on hisown behalf. 6. Skin/Wound Care:Monitor surgical wounds daily for healing . 7. Fluids/Electrolytes/Nutrition:Monitor I/O. Added water flushes qid. Changed tube feeds to evening to assist with appetite. Does not like pureed foods. 8. ESBL E coli (penile meatus): Completed meropenem and Vancomycin  9. Hypokalemia: Being Managed with runs of K+. Added supplement via PEG.  K+ 4.2 on 12/3  Con to monitor 10.Anoxic BI: Continue Seroquel and wean over next few days.   Establish better sleep wake cycle 11. Leukocytosis: Continue to monitor for trends   Afebrile   WBCs 13. 9 on 12/3  Cont to monitor 12. ZHY:QMVHQIONGEPAF:Amiodarone 200 mg bid and coreg addedon11/29 13. COPD with chronic hypoxic/hypercarbic failure:  Wean supplemental O2 as able. Encouraging IS  Will likely require supplemental O2 at discharge 14. Acute systolic heart failure due to cardiogenic shock: EF improved to 40%. No lasix. Monitor weight for signs of overload. Check weights daily.  Filed Weights   04/30/17 0410 05/01/17 0309 05/02/17 0400  Weight: 91.6 kg  (201 lb 15.1 oz) 91.3 kg (201 lb 4.5 oz) 91.4 kg (201 lb 8.4 oz)   15. ABLA: Monitor H/H serially.    Hb 9.2 on 12/3  Cont to monitor 16. Hyperglycemia  Due to TFs   Relatively controlled on 12/4  Cont to monitor 17. Labile BP  Relatively controlled on 12/4   LOS (Days) 5 A FACE TO FACE EVALUATION WAS PERFORMED  Ankit Karis JubaAnil Patel, MD 05/02/2017 8:55 AM

## 2017-05-02 NOTE — Progress Notes (Signed)
Physical Therapy Note  Patient Details  Name: Ronald HoveGary [name] MRN: [id number] Date of Birth: 07-10-1950 Today's Date: 05/02/2017    Time: 1303-1330 27 minutes  1:1 No c/o pain.  Pt agreeable to sit edge of bed to eat lunch.  Pt min A for supine to sit.  Able to sit edge of bed x 20 minutes with LE support to perform eating lunch. Pt continues to require cues for small bites during lunch.  Pt then performs sit to supine with supervision, increased time and cuing. Pt left in bed with needs at hand, RN present.  Kahmari Koller 05/02/2017, 1:58 PM

## 2017-05-02 NOTE — Progress Notes (Signed)
Recreational Therapy Session Note  Patient Details  Name: Ronald HoveGary [name] MRN: [id number] Date of Birth: 09-04-1950 Today's Date: 05/02/2017  TR eval deferred.  Will continue to monitor through team for future participation.  Suzzane Quilter 05/02/2017, 10:37 AM

## 2017-05-02 NOTE — Progress Notes (Signed)
Occupational Therapy Session Note  Patient Details  Name: Arlana HoveGary Coldwell MRN: [id number] Date of Birth: 12-26-1950  Today's Date: 05/02/2017 OT Individual Time: [id number] OT Individual Time Calculation (min): 60 min    Short Term Goals: Week 1:  OT Short Term Goal 1 (Week 1): Pt will be able to sit >< stand with min A to prepare for LB dressing. OT Short Term Goal 2 (Week 1): Pt will be able to transfer on and off toilet with min A. OT Short Term Goal 3 (Week 1): Pt will demonstrate improved dynamic standing balance to be able to reach around to cleanse bottom with min A. OT Short Term Goal 4 (Week 1): Pt will don pants with min A. OT Short Term Goal 5 (Week 1): Pt will stay focused on ADL task with min cues.  Skilled Therapeutic Interventions/Progress Updates:    Pt seen for shower and dressing training with a focus on activity tolerance, standing balance and adherance to sternal precautions.  Pt initially performed mobility and shower on 3L O2, pt was at 100% sat rate.  O2 removed and pt on room air for last 25 min at session at 96% sat rate. Pt received in bed and initially needed max cues to wake and get out of bed with mod A.  Pt then became more alert and was able to engage well. No tangential speech or distracted thoughts today. Pt was able to maintain focus and maintained logical conversation.  He did state that he will most likely go to his sister's home after discharge.  Pt did need min cues to not push up with his hands to stand. From elevated bed, pt stood with min A and then mod A from tub bench and w/c.  Pt used RW to ambulate in and out of bathroom with min.  Completed shower with min A.  Pt continues to be very apprehensive with standing, stating that he feels like he will just fall over. Pt will need to learn to use AE as he continues to have difficulty with reaching forward enough to donn pants and shoes.  Pt resting in w/c with PT entering his room for his next  session. \  Therapy Documentation Precautions:  Precautions Precautions: Sternal, Fall Restrictions Weight Bearing Restrictions: (sternal precautions) Other Position/Activity Restrictions: Sternal precautions  Vital Signs: Therapy Vitals Pulse Rate: 75 BP: 132/61 Patient Position (if appropriate): Lying Oxygen Therapy SpO2: 96 % O2 Device: Not Delivered  Pain: Pain Assessment Pain Assessment: ("my teeth hurt, they need to be pulled") ADL: ADL ADL Comments: Refer to functional navigator  See Function Navigator for Current Functional Status.   Therapy/Group: Individual Therapy  Adelaine Roppolo 05/02/2017, 10:19 AM

## 2017-05-03 ENCOUNTER — Inpatient Hospital Stay (HOSPITAL_COMMUNITY): Payer: Medicare Other

## 2017-05-03 ENCOUNTER — Inpatient Hospital Stay (HOSPITAL_COMMUNITY): Payer: Medicare Other | Admitting: Physical Therapy

## 2017-05-03 ENCOUNTER — Inpatient Hospital Stay (HOSPITAL_COMMUNITY): Payer: Medicare Other | Admitting: Speech Pathology

## 2017-05-03 ENCOUNTER — Encounter (HOSPITAL_COMMUNITY): Payer: Medicare Other | Admitting: Psychology

## 2017-05-03 ENCOUNTER — Inpatient Hospital Stay (HOSPITAL_COMMUNITY): Payer: Medicare Other | Admitting: Occupational Therapy

## 2017-05-03 DIAGNOSIS — Z9981 Dependence on supplemental oxygen: Secondary | ICD-10-CM

## 2017-05-03 DIAGNOSIS — J449 Chronic obstructive pulmonary disease, unspecified: Secondary | ICD-10-CM

## 2017-05-03 DIAGNOSIS — R0989 Other specified symptoms and signs involving the circulatory and respiratory systems: Secondary | ICD-10-CM

## 2017-05-03 DIAGNOSIS — R1310 Dysphagia, unspecified: Secondary | ICD-10-CM

## 2017-05-03 LAB — GLUCOSE, CAPILLARY
GLUCOSE-CAPILLARY: 111 mg/dL — AB (ref 65–99)
GLUCOSE-CAPILLARY: 113 mg/dL — AB (ref 65–99)
GLUCOSE-CAPILLARY: 114 mg/dL — AB (ref 65–99)
Glucose-Capillary: 142 mg/dL — ABNORMAL HIGH (ref 65–99)
Glucose-Capillary: 96 mg/dL (ref 65–99)
Glucose-Capillary: 98 mg/dL (ref 65–99)
Glucose-Capillary: 99 mg/dL (ref 65–99)

## 2017-05-03 LAB — MAGNESIUM: MAGNESIUM: 2.3 mg/dL (ref 1.7–2.4)

## 2017-05-03 MED ORDER — ACETAMINOPHEN 325 MG PO TABS: 325.0000 mg | ORAL_TABLET | ORAL | Status: DC | PRN

## 2017-05-03 NOTE — Consult Note (Signed)
Neuropsychological Consultation   Patient:   [name]   DOB:   11-26-1950  MR Number:  [id number]  [facility]  [facility] [address] [id number] [place] [id number] Dept: [phone] Loc: [phone]           Date of Service:   05/03/2017  Start Time:   10 AM End Time:   11 AM  Provider/Observer:  [name], Psy.D.       Clinical Neuropsychologist       Billing Code/Service: Neurobehavioral Status Exam  Chief Complaint:    [name] is a 66 year old male with history of CAD, HTN, COPD,chronic neck pain.  Admitted on 04/11/17 with SOB and malaise due to inferior lateral STEMI, lethargy and cardiogenic shock requiring intubation in ED. History taken from chart review and sister.He underwent cardiac cath with RCA DES and balloon angioplasty of mid LAD. He required IABP as well as neo due to hypotension. Echo showed diffuse hypokinesis with moderate pericardial effusion and EF 25-30%.  Since coming to the CIR program, patient has shown slightly improving but continued cognitive deficits from from the cardiac event with indications of cognitive deficits to to anoxic event.  Reason for Service:  [name] was referred for neuropsychological consultation due to ongoing cognitive deficits.  Below is the HPI for the current admission.    UUV:OZDGHPI:Ronald Zeigleris a 66 y.o.malewith history of CAD, HTN, COPD,chronic neck pain;who was admitted on 04/11/17 with SOB and malaise due to inferior lateral STEMI, lethargy and cardiogenic shock requiring intubation in ED. History taken from chart review and sister.He underwent cardiac cath with RCA DES and balloon angioplasty of mid LAD. He required IABP as well as neo due to hypotension. Echo showed diffuse hypokinesis with moderate pericardial effusion and EF 25-30%. Dr. Tyrone [name] consulted due to concerns of LV rupture and TEE showed contained  rupture with extensive thrombus. He was taken to OR for CABG with resection of LV aneurysm with placement of impella 11/14 and underwent chest closure on 11/17. Hospital course significant for E coli in 1/2 blood cultures and wound--antibiotic coverage broadened to meropenum but with upward trend in tempstherefore Vancomycin added on 11/27.  He has been weaned of Neo and treated for acute on chronic systolic CHF. He tolerated extubation on 11/26 and MBSdone 11/28to evaluate swallow. Hewas started on pudding/puree dietand foley d/c yesterday.Therapy evaluations done yesterday revealing debility and CIR recommended due to functional deficits.   Current Status:  [name] is having issues with arousal, memory and fluent expressive language.  Further deficits are hard to objectively at this time due to his lethargy.  The patient has some slurring to speech but does appear to have good word finding.   The patient has some disturbance to orientation relative to time and some factual information about his own history.  Likely due to retrieval deficits and lethargy.  Could not do more that simple memory testing and this did suggest impairments in learning and retrieval of new information.    Behavioral Observation: [name]  presents as a 66 y.o.-year-old Right Caucasian Male who appeared his stated age. his dress was Appropriate and he was Well Groomed and his manners were Appropriate to the situation.  his participation was indicative of Drowsy and Inattentive behaviors.  There were any physical disabilities noted.  he displayed an appropriate level of cooperation and motivation.     Interactions:    Minimal Appropriate and  Drowsy  Attention:   abnormal and attention span appeared shorter than expected for age  Memory:   abnormal; global memory impairment noted  Visuo-spatial:  not examined  Speech (Volume):  low  Speech:   garbled; slurred  Thought Process:  Circumstantial  Though  Content:  WNL; not suicidal  Orientation:   person, place and situation  Judgment:   Poor  Planning:   Poor  Affect:    Blunted and Irritable  Mood:    Dysphoric  Insight:   Lacking  Intelligence:   normal  Medical History:   Past Medical History:  Diagnosis Date  . Coronary artery disease   . Hypertension   Family Med/Psych History: No family history on file.  Risk of Suicide/Violence: low   Impression/DX: [name] is a [age] year old male with history of CAD, HTN, COPD,chronic neck pain.  Admitted on 04/11/17 with SOB and malaise due to inferior lateral STEMI, lethargy and cardiogenic shock requiring intubation in ED. History taken from chart review and sister.He underwent cardiac cath with RCA DES and balloon angioplasty of mid LAD. He required IABP as well as neo due to hypotension. Echo showed diffuse hypokinesis with moderate pericardial effusion and EF 25-30%.  Since coming to the CIR program, patient has shown slightly improving but continued cognitive deficits from from the cardiac event with indications of cognitive deficits to to anoxic event.  [name] is having issues with arousal, memory and fluent expressive language.  Further deficits are hard to objectively at this time due to his lethargy.  The patient has some slurring to speech but does appear to have good word finding.   The patient has some disturbance to orientation relative to time and some factual information about his own history.  Likely due to retrieval deficits and lethargy.  Could not do more that simple memory testing and this did suggest impairments in learning and retrieval of new information.           Electronically Signed   _______________________ [name], Psy.D.

## 2017-05-03 NOTE — Progress Notes (Signed)
Physical Therapy Note  Patient Details  Name: [name] MRN: [id number] Date of Birth: [birth year] Today's Date: 05/03/2017    Time: 930-955 25 minutes  1:1 pt with no c/o pain, c/o fatigue, "I don't think I can do this".  Pt agreeable to sit edge of bed and then to sit in recliner with max encouragement.  Pt performed supine to sit with min A and increased time.  Sit to stand without RW with mod A, stand pivot transfer min A to recliner.  Pt performed 10 x each LAQ, hip flex and ankle pumps with max encouragement, cues for deep breathing.  Pt refuses 35 minutes of session due to fatigue and anxiety.  RN made aware   Deontaye Civello 05/03/2017, 10:07 AM

## 2017-05-03 NOTE — Progress Notes (Addendum)
Physical Therapy Note  Patient Details  Name: [name] MRN: [id number] Date of Birth: Apr 05, 1951 Today's Date: 05/03/2017  1345-1435, 50 min individual tx Pain: " a little bit, at my chest (surgical) but I don't need medicine"  Seated O2 sats 96%, HR 69 on 1 L O2 via [place]  Recliner> w/c using RW with mod assist to standing, without use of UEs, cues for safe use of RW.  Seated therapeutic exercise performed with LEs to increase strength for functional mobility: 10 x 1 hip flex R/L, long arc quad knee ext, ankle pumps, heel lifts.  HR 132, O2 sats 86% after ex.  O2 increased to 2L, with O2 98% and HR 73. Rest breaks between each set of 10 exs, and before gait.   Gait training with RW on level tile x 8' with min assist. Sats dropped to 82% after gait.  Pt needed approx 2 minutes with cues for deep breathing to rise to 90%. Pt took extra time to decide to transfer back to recliner in room. Pt left resting in recliner with quick release belt applied and all needs within reach. Oxygen via [place] set at 2L.  PT informed Charisse MarchJeanna, RN about O2 needs with exertion.    See function navigator for current status.  [name] 05/03/2017, 2:04 PM

## 2017-05-03 NOTE — Progress Notes (Addendum)
Speech Language Pathology Daily Session Note  Patient Details  Name: [name] MRN: [id number] Date of Birth: 1950/06/04  Today's Date: 05/03/2017 SLP Individual Time: 1030-1050 SLP Individual Time Calculation (min): 20 min  Short Term Goals: Week 1: SLP Short Term Goal 1 (Week 1): Pt will consume therapeutic trials of ice chips with minimal overt s/s of aspiration over 3 consecutive sessions prior to repeat objective assessment.   SLP Short Term Goal 2 (Week 1): Pt will sustain his attention to functional tasks for 5 minutes with min verbal cues cor redirection.   SLP Short Term Goal 3 (Week 1): Pt will maintain a topic of conversation for at least 5 turns with min verbal cues.    SLP Short Term Goal 4 (Week 1): Pt will complete basic, functional tasks with min verbal cues for problem solving.   SLP Short Term Goal 5 (Week 1): Pt will consume dys 1 textures and pudding thick liquids with supervision cues for use of swallowing precautions.   SLP Short Term Goal 6 (Week 1): Pt will return demonstration of at least 2 safety precautions with mod assist verbal cues.    Skilled Therapeutic Interventions: Pt seen this date for skilled SLP intervention targeting dysphagia tx. Pt significantly limited by lethargy this date with Max A verbal + tactile cues including wet washcloth to face and oral care to maintain arousal. Pt alert for ~5 minutes and accepted ice chips x 2 with prolonged oral prep noted and suspected delayed swallow initiation and diminished laryngeal vestibule closure given overt s/sx of aspiration including wet vocal quality and throat clear on 2 out of 2 trials. After 2nd trial, pt stating he was "tired" and did not want to participate any further. MBSS to be completed next date to re-assess pt's swallow function and safety for upgrade re: PO diet consistencies. Session completed with call bell left within pt's reach. SLP to continue current plan of care.    Function:  Cognition Comprehension Comprehension assist level: Understands basic 90% of the time/cues < 10% of the time  Expression   Expression assist level: Expresses complex 90% of the time/cues < 10% of the time  Social Interaction Social Interaction assist level: Interacts appropriately 75 - 89% of the time - Needs redirection for appropriate language or to initiate interaction.  Problem Solving Problem solving assist level: Solves basic 50 - 74% of the time/requires cueing 25 - 49% of the time  Memory Memory assist level: Recognizes or recalls 50 - 74% of the time/requires cueing 25 - 49% of the time    Pain Pain Assessment Pain Assessment: No/denies pain  Therapy/Group: Individual Therapy  Aiyanah Kalama A Risa Auman 05/03/2017, 12:51 PM

## 2017-05-03 NOTE — Progress Notes (Signed)
[place] PHYSICAL MEDICINE & REHABILITATION     PROGRESS NOTE    Subjective/Complaints: Patient seen sitting at edge of the bed this morning, eating breakfast. He states he slept well overnight. Discussed participation with SLP with SLP this morning. Then discussed this patient with patient, who states that he will do his best with SLP to advance his diet.  ROS: Denies CP, SOB, N/V/D.  Objective: Vital Signs: Blood pressure 139/66, pulse 69, temperature 98 F (36.7 C), temperature source Oral, resp. rate 18, height 5\' 7"  (1.702 m), weight 91 kg (200 lb 9.9 oz), SpO2 95 %. No results found. Recent Labs    05/01/17 0342  WBC 13.9*  HGB 9.2*  HCT 30.3*  PLT 592*   Recent Labs    05/01/17 0342  NA 140  K 4.2  CL 105  GLUCOSE 125*  BUN 24*  CREATININE 0.66  CALCIUM 8.3*   CBG (last 3)  Recent Labs    05/03/17 0436 05/03/17 0731 05/03/17 1139  GLUCAP 142* 96 98    Wt Readings from Last 3 Encounters:  05/03/17 91 kg (200 lb 9.9 oz)  04/27/17 88.3 kg (194 lb 10.7 oz)    Physical Exam:  Constitutional: He appearswell-developedand well-nourished.No distress. HENT: Normocephalicand atraumatic.  Eyes:EOMare normal. No discharge.  Cardiovascular:Normal rate. +murmur. No JVD Respiratory:Lungs clear. Unlabored.  ZO:XWRUEGI:Bowel sounds are normal. He exhibitsno distension.  +NG Musculoskeletal: He exhibits no edema, no tenderness. Neurological: He isalert. He can follow simple commands.   Motor: B/l UE 4-/5 proximal to distal  B/l LE: HF 2/5, KE 4/5, ADF/PF 4/5  Skin iswarmand dry. He isnot diaphoretic.Sternal incision is intact.   Psychiatric: Pleasant. Tangential  Assessment/Plan: 1.  Functional deficits and decreased mobility secondary to debility after multiple medical complications which require 3+ hours per day of interdisciplinary therapy in a comprehensive inpatient rehab setting. Physiatrist is providing close team supervision and 24 hour  management of active medical problems listed below. Physiatrist and rehab team continue to assess barriers to discharge/monitor patient progress toward functional and medical goals.  Function:  Bathing Bathing position   Position: Sitting EOB  Bathing parts Body parts bathed by patient: Right upper leg, Front perineal area, Left upper leg(LB only this am) Body parts bathed by helper: Right lower leg, Left lower leg, Buttocks  Bathing assist        Upper Body Dressing/Undressing Upper body dressing   What is the patient wearing?: Pull over shirt/dress     Pull over shirt/dress - Perfomed by patient: Thread/unthread right sleeve, Thread/unthread left sleeve, Pull shirt over trunk Pull over shirt/dress - Perfomed by helper: Put head through opening        Upper body assist Assist Level: Supervision or verbal cues      Lower Body Dressing/Undressing Lower body dressing   What is the patient wearing?: Pants, Shoes     Pants- Performed by patient: Thread/unthread right pants leg, Thread/unthread left pants leg, Pull pants up/down Pants- Performed by helper: Thread/unthread right pants leg, Thread/unthread left pants leg   Non-skid slipper socks- Performed by helper: Don/doff right sock, Don/doff left sock   Socks - Performed by helper: Don/doff right sock, Don/doff left sock   Shoes - Performed by helper: Don/doff right shoe, Don/doff left shoe          Lower body assist Assist for lower body dressing: Touching or steadying assistance (Pt > 75%)(with use of reacher and long shoe horn)      Toileting Toileting  Toileting steps completed by helper: Adjust clothing prior to toileting, Performs perineal hygiene, Adjust clothing after toileting Toileting Assistive Devices: Grab bar or rail, Prosthesis/orthosis  Toileting assist Assist level: Touching or steadying assistance (Pt.75%)   Transfers Chair/bed transfer   Chair/bed transfer method: Stand pivot Chair/bed  transfer assist level: Touching or steadying assistance (Pt > 75%) Chair/bed transfer assistive device: Patent attorneyWalker     Locomotion Ambulation     Max distance: 10 Assist level: Touching or steadying assistance (Pt > 75%)   Wheelchair   Type: Manual Max wheelchair distance: 50' Assist Level: Touching or steadying assistance (Pt > 75%)  Cognition Comprehension Comprehension assist level: Understands basic 90% of the time/cues < 10% of the time  Expression Expression assist level: Expresses complex 90% of the time/cues < 10% of the time  Social Interaction Social Interaction assist level: Interacts appropriately 90% of the time - Needs monitoring or encouragement for participation or interaction.  Problem Solving Problem solving assist level: Solves basic 50 - 74% of the time/requires cueing 25 - 49% of the time  Memory Memory assist level: Recognizes or recalls 50 - 74% of the time/requires cueing 25 - 49% of the time   Medical Problem List and Plan: 1.   Debility secondary to coronary artery disease and left ventricular rupture with acute systolic heart failure  Cont CIR 2. DVT Prophylaxis/Anticoagulation: Pharmaceutical:Lovenox 3. Pain Management: Use acetaminophen as needed 4. Mood:[name] to follow for evaluation and support. 5. Neuropsych: This patientis not fullycapable of making decisions on hisown behalf. 6. Skin/Wound Care:Monitor surgical wounds daily for healing . 7. Fluids/Electrolytes/Nutrition:Monitor I/O. Added water flushes qid. Changed tube feeds to evening to assist with appetite. Does not like pureed foods.   Currently on D1 pudding. Encourage participation with SLP. Will attempt to advance diet as tolerated.  Magnesium within normal limits on 12/5 8. ESBL E coli (penile meatus): Completed meropenem and Vancomycin  9. Hypokalemia: Being Managed with runs of K+. Added supplement via PEG.  K+ 4.2 on 12/3  Con to monitor 10.Anoxic BI: Continue Seroquel and wean  over next few days.   Establish better sleep wake cycle 11. Leukocytosis: Continue to monitor for trends   Afebrile   WBCs 13. 9 on 12/3  Cont to monitor 12. BJY:NWGNFAOZHYPAF:Amiodarone 200 mg bid and coreg addedon11/29 13. COPD with chronic hypoxic/hypercarbic failure:  Wean supplemental O2 as able. Encouraging IS  Will likely require supplemental O2 at discharge 14. Acute systolic heart failure due to cardiogenic shock: EF improved to 40%. No lasix. Monitor weight for signs of overload. Check weights daily.  Filed Weights   05/01/17 0309 05/02/17 0400 05/03/17 0450  Weight: 91.3 kg (201 lb 4.5 oz) 91.4 kg (201 lb 8.4 oz) 91 kg (200 lb 9.9 oz)   15. ABLA: Monitor H/H serially.    Hb 9.2 on 12/3  Cont to monitor 16. Hyperglycemia  Due to TFs   Relatively controlled on 12/5  Cont to monitor 17. Labile BP  Relatively controlled on 12/5   LOS (Days) 6 A FACE TO FACE EVALUATION WAS PERFORMED  Chiffon Kittleson Karis JubaAnil Xzayvion Vaeth, MD 05/03/2017 12:39 PM

## 2017-05-03 NOTE — Progress Notes (Signed)
Occupational Therapy Session Note  Patient Details  Name: [name] MRN: [id number] Date of Birth: 01/18/1951  Today's Date: 05/03/2017 OT Individual Time: 0156-1537 OT Individual Time Calculation (min): 60 min    Short Term Goals: Week 1:  OT Short Term Goal 1 (Week 1): Pt will be able to sit >< stand with min A to prepare for LB dressing. OT Short Term Goal 2 (Week 1): Pt will be able to transfer on and off toilet with min A. OT Short Term Goal 3 (Week 1): Pt will demonstrate improved dynamic standing balance to be able to reach around to cleanse bottom with min A. OT Short Term Goal 4 (Week 1): Pt will don pants with min A. OT Short Term Goal 5 (Week 1): Pt will stay focused on ADL task with min cues.  Skilled Therapeutic Interventions/Progress Updates:    Pt received in bed and he needed to have LB clothing changed but did not have a clean shirt available so only LB bathing and dressing worked on this morning. It took several minutes to get pt to be will to participate. He repeated telling me numerous times that he did not sleep at all last night and he did not plan on doing therapies.  He was agreeable to self care so used that opportunity to challenge him with sit to stands and standing tolerance. He cleansed his perineal area by using a washcloth in a sling method.  Needs assist for buttocks.   Pt practiced using a reacher to don pants over feet with mod A and then the shoe horn to don slippers with mod A.  He sat EOB for 40 min then suddenly became tired.  He layed back into bed and then began expressing anxiety with heavy short breathing.  To calm pt, reapplied O2 (he tolerated room air with sats of 95%).  Pt seemed to calm at this point. Pt in bed with all needs met, bed alarm set.  Reported to RN pt's status.   Therapy Documentation Precautions:  Precautions Precautions: Sternal, Fall Restrictions Weight Bearing Restrictions: (sternal precautions) Other Position/Activity  Restrictions: Sternal precautions  Vital Signs: Therapy Vitals Pulse Rate: 69 BP: 139/66 Pain:  c/o generalized chronic back pain with some sit to stand movements ADL: ADL ADL Comments: Refer to functional navigator  See Function Navigator for Current Functional Status.   Therapy/Group: Individual Therapy  [place] Glade 05/03/2017, 12:16 PM

## 2017-05-03 NOTE — Progress Notes (Signed)
Cortrack tube clogged from thickness of prostat.  RN unable to have vital 1.5 run 6p-4a due to clogging of tube.  RN notified Harvel Ricksan Anguilli, PA with finding. Barium swallow test scheduled in the morning to assess swallowing. Tube will flush 200cc water with no problem due to the pressure behind the flush. With small amounts of volume of fed to enter the tube per hour (2280ml/hr) the tube clogs and unable to be in use. Flushes to continue throughout shift.

## 2017-05-04 ENCOUNTER — Inpatient Hospital Stay (HOSPITAL_COMMUNITY): Payer: Medicare Other | Admitting: Occupational Therapy

## 2017-05-04 ENCOUNTER — Inpatient Hospital Stay (HOSPITAL_COMMUNITY): Payer: Medicare Other

## 2017-05-04 ENCOUNTER — Ambulatory Visit (HOSPITAL_COMMUNITY): Payer: Medicare Other | Admitting: Speech Pathology

## 2017-05-04 ENCOUNTER — Encounter (HOSPITAL_COMMUNITY): Payer: Medicare Other | Admitting: Speech Pathology

## 2017-05-04 LAB — GLUCOSE, CAPILLARY
GLUCOSE-CAPILLARY: 120 mg/dL — AB (ref 65–99)
GLUCOSE-CAPILLARY: 82 mg/dL (ref 65–99)
GLUCOSE-CAPILLARY: 91 mg/dL (ref 65–99)
Glucose-Capillary: 94 mg/dL (ref 65–99)
Glucose-Capillary: 102 mg/dL — ABNORMAL HIGH (ref 65–99)
Glucose-Capillary: 104 mg/dL — ABNORMAL HIGH (ref 65–99)

## 2017-05-04 LAB — MAGNESIUM: Magnesium: 2.3 mg/dL (ref 1.7–2.4)

## 2017-05-04 NOTE — Progress Notes (Signed)
Speech Language Pathology Daily Session Note  Patient Details  Name: Ronald HoveGary Trettin MRN: [id number] Date of Birth: 06-07-1950  Today's Date: 05/04/2017 SLP Individual Time: [id number] SLP Individual Time Calculation (min): 20 min  Short Term Goals: Week 1: SLP Short Term Goal 1 (Week 1): Pt will consume therapeutic trials of ice chips with minimal overt s/s of aspiration over 3 consecutive sessions prior to repeat objective assessment.   SLP Short Term Goal 2 (Week 1): Pt will sustain his attention to functional tasks for 5 minutes with min verbal cues cor redirection.   SLP Short Term Goal 3 (Week 1): Pt will maintain a topic of conversation for at least 5 turns with min verbal cues.    SLP Short Term Goal 4 (Week 1): Pt will complete basic, functional tasks with min verbal cues for problem solving.   SLP Short Term Goal 5 (Week 1): Pt will consume dys 1 textures and pudding thick liquids with supervision cues for use of swallowing precautions.   SLP Short Term Goal 6 (Week 1): Pt will return demonstration of at least 2 safety precautions with mod assist verbal cues.    Skilled Therapeutic Interventions:  Pt was seen for skilled ST targeting dysphagia goals. Pt could recall his swallowing precautions from MBS with min question cues and returned demonstration of volitional cough/throat clear and second swallow with supervision during his lunch meal.  No overt s/s of aspiration were evident with purees or honey thick liquids.  Of note, pt did not have dys 2 textures on today's lunch tray; therefore therapist will assess toleration of solids at next available appointment.  Pt was left in recliner with call bell within reach and quick release belt donned.  Continue per current plan of care.    Function:  Eating Eating     Eating Assist Level: Supervision or verbal cues           Cognition Comprehension Comprehension assist level: Follows basic conversation/direction with extra time/assistive  device  Expression   Expression assist level: Expresses basic needs/ideas: With extra time/assistive device  Social Interaction Social Interaction assist level: Interacts appropriately 75 - 89% of the time - Needs redirection for appropriate language or to initiate interaction.  Problem Solving Problem solving assist level: Solves basic 50 - 74% of the time/requires cueing 25 - 49% of the time  Memory Memory assist level: Recognizes or recalls 50 - 74% of the time/requires cueing 25 - 49% of the time    Pain Pain Assessment Pain Assessment: No/denies pain  Therapy/Group: Individual Therapy  Wenceslao Loper, Melanee SpryNicole L 05/04/2017, 7:59 PM

## 2017-05-04 NOTE — Progress Notes (Signed)
Occupational Therapy Session Note  Patient Details  Name: Ronald HoveGary [name] MRN: [id number] Date of Birth: 1950-09-02  Today's Date: 05/04/2017 OT Individual Time: 1100-1130 OT Individual Time Calculation (min): 30 min    Short Term Goals: Week 1:  OT Short Term Goal 1 (Week 1): Pt will be able to sit >< stand with min A to prepare for LB dressing. OT Short Term Goal 2 (Week 1): Pt will be able to transfer on and off toilet with min A. OT Short Term Goal 3 (Week 1): Pt will demonstrate improved dynamic standing balance to be able to reach around to cleanse bottom with min A. OT Short Term Goal 4 (Week 1): Pt will don pants with min A. OT Short Term Goal 5 (Week 1): Pt will stay focused on ADL task with min cues.  Skilled Therapeutic Interventions/Progress Updates:    1:1 Pt engaged in bed mobility with min A with more than reasonable amt of time. Pt performed sit to stands with min guard from EOB  With control without UE support. Pt able to perform stand pivot transfers with min A without AD.  In recliner pt participated in d/c planning and functional problem solving conversation with mod A while Aed pt with shaving task (total A).  Left resting in the recliner.   Therapy Documentation Precautions:  Precautions Precautions: Sternal, Fall Restrictions Weight Bearing Restrictions: No(sternal precautions) Other Position/Activity Restrictions: Sternal precautions General:   Vital Signs:  Pain:  mild back pain- readjusted and changed positions ADL: ADL ADL Comments: Refer to functional navigator  See Function Navigator for Current Functional Status.   Therapy/Group: Individual Therapy  Roney MansSmith, [facility] 05/04/2017, 1:38 PM

## 2017-05-04 NOTE — Progress Notes (Signed)
[place] PHYSICAL MEDICINE & REHABILITATION     PROGRESS NOTE    Subjective/Complaints: Patient seen lying in bed this morning. He states he slept well overnight. She wants to know when his MBS is scheduled for today.  ROS: Denies CP, SOB, N/V/D.  Objective: Vital Signs: Blood pressure (!) 118/58, pulse 82, temperature 98.1 F (36.7 C), temperature source Oral, resp. rate 16, height 5\' 7"  (1.702 m), weight 91 kg (200 lb 9.9 oz), SpO2 92 %. No results found. No results for input(s): WBC, HGB, HCT, PLT in the last 72 hours. No results for input(s): NA, K, CL, GLUCOSE, BUN, CREATININE, CALCIUM in the last 72 hours.  Invalid input(s): CO CBG (last 3)  Recent Labs    05/04/17 0000 05/04/17 0658 05/04/17 0811  GLUCAP 99 91 94    Wt Readings from Last 3 Encounters:  05/03/17 91 kg (200 lb 9.9 oz)  04/27/17 88.3 kg (194 lb 10.7 oz)    Physical Exam:  Constitutional: He appearswell-developedand well-nourished.No distress. HENT: Normocephalicand atraumatic.  Eyes:EOMare normal. No discharge.  Cardiovascular: Normal rate. +murmur. No JVD Respiratory:Lungs clear. Unlabored.  WJ:XBJYNGI:Bowel sounds are normal. He exhibitsno distension.  +NG Musculoskeletal: He exhibits no edema, no tenderness. Neurological: He isalert. He can follow simple commands.   Motor: B/l UE 4-/5 proximal to distal  B/l LE: HF 2/5, KE 4/5, ADF/PF 4/5 (stable) Skin iswarmand dry. He isnot diaphoretic.Sternal incision is intact.   Psychiatric: Pleasant. Tangential  Assessment/Plan: 1.  Functional deficits and decreased mobility secondary to debility after multiple medical complications which require 3+ hours per day of interdisciplinary therapy in a comprehensive inpatient rehab setting. Physiatrist is providing close team supervision and 24 hour management of active medical problems listed below. Physiatrist and rehab team continue to assess barriers to discharge/monitor patient progress  toward functional and medical goals.  Function:  Bathing Bathing position   Position: Sitting EOB  Bathing parts Body parts bathed by patient: Right upper leg, Front perineal area, Left upper leg(LB only this am) Body parts bathed by helper: Right lower leg, Left lower leg, Buttocks  Bathing assist        Upper Body Dressing/Undressing Upper body dressing   What is the patient wearing?: Pull over shirt/dress     Pull over shirt/dress - Perfomed by patient: Thread/unthread right sleeve, Thread/unthread left sleeve, Pull shirt over trunk Pull over shirt/dress - Perfomed by helper: Put head through opening        Upper body assist Assist Level: Supervision or verbal cues      Lower Body Dressing/Undressing Lower body dressing   What is the patient wearing?: Pants, Shoes     Pants- Performed by patient: Thread/unthread right pants leg, Thread/unthread left pants leg, Pull pants up/down Pants- Performed by helper: Thread/unthread right pants leg, Thread/unthread left pants leg   Non-skid slipper socks- Performed by helper: Don/doff right sock, Don/doff left sock   Socks - Performed by helper: Don/doff right sock, Don/doff left sock   Shoes - Performed by helper: Don/doff right shoe, Don/doff left shoe          Lower body assist Assist for lower body dressing: Touching or steadying assistance (Pt > 75%)(with use of reacher and long shoe horn)      Toileting Toileting     Toileting steps completed by helper: Adjust clothing prior to toileting, Performs perineal hygiene, Adjust clothing after toileting Toileting Assistive Devices: Grab bar or rail, Prosthesis/orthosis  Toileting assist Assist level: Touching or steadying assistance (Pt.75%)  Transfers Chair/bed transfer   Chair/bed transfer method: Stand pivot Chair/bed transfer assist level: Touching or steadying assistance (Pt > 75%) Chair/bed transfer assistive device: Patent attorneyWalker     Locomotion Ambulation     Max  distance: 8 Assist level: Touching or steadying assistance (Pt > 75%)   Wheelchair   Type: Manual Max wheelchair distance: 50' Assist Level: Touching or steadying assistance (Pt > 75%)  Cognition Comprehension Comprehension assist level: Understands basic 90% of the time/cues < 10% of the time  Expression Expression assist level: Expresses basic needs/ideas: With extra time/assistive device  Social Interaction Social Interaction assist level: Interacts appropriately 75 - 89% of the time - Needs redirection for appropriate language or to initiate interaction.  Problem Solving Problem solving assist level: Solves basic 50 - 74% of the time/requires cueing 25 - 49% of the time  Memory Memory assist level: Recognizes or recalls 50 - 74% of the time/requires cueing 25 - 49% of the time   Medical Problem List and Plan: 1.   Debility secondary to coronary artery disease and left ventricular rupture with acute systolic heart failure  Cont CIR 2. DVT Prophylaxis/Anticoagulation: Pharmaceutical:Lovenox 3. Pain Management: Use acetaminophen as needed 4. Mood:[name] to follow for evaluation and support.   History significant amount of motivation 5. Neuropsych: This patientis not fullycapable of making decisions on hisown behalf. 6. Skin/Wound Care:Monitor surgical wounds daily for healing . 7. Fluids/Electrolytes/Nutrition:Monitor I/O. Added water flushes qid. Changed tube feeds to evening to assist with appetite. Does not like pureed foods.   Currently on D1 pudding. Encourage participation with SLP. Will attempt to advance diet as tolerated.   MBS planned for today  Magnesium within normal limits on 12/5 8. ESBL E coli (penile meatus): Completed meropenem and Vancomycin  9. Hypokalemia: Being Managed with runs of K+. Added supplement via PEG.  K+ 4.2 on 12/3  Con to monitor 10.Anoxic BI: Continue Seroquel and wean over next few days.   Establish better sleep wake cycle 11.  Leukocytosis: Continue to monitor for trends   Afebrile   WBCs 13. 9 on 12/3   Labs ordered for tomorrow  Cont to monitor 12. GNF:AOZHYQMVHQPAF:Amiodarone 200 mg bid and coreg addedon11/29 13. COPD with chronic hypoxic/hypercarbic failure:  Wean supplemental O2 as able. Encouraging IS 14. Acute systolic heart failure due to cardiogenic shock: EF improved to 40%. No lasix. Monitor weight for signs of overload. Check weights daily.  Filed Weights   05/01/17 0309 05/02/17 0400 05/03/17 0450  Weight: 91.3 kg (201 lb 4.5 oz) 91.4 kg (201 lb 8.4 oz) 91 kg (200 lb 9.9 oz)   15. ABLA: Monitor H/H serially.    Hb 9.2 on 12/3  Cont to monitor 16. Hyperglycemia  Due to TFs   Relatively controlled on 12/6  Cont to monitor 17. Labile BP  Relatively controlled on 12/6   LOS (Days) 7 A FACE TO FACE EVALUATION WAS PERFORMED  Miku Udall Karis JubaAnil Sameera Betton, MD 05/04/2017 12:40 PM

## 2017-05-04 NOTE — Progress Notes (Signed)
Occupational Therapy Session Note  Patient Details  Name: [name] MRN: [id number] Date of Birth: Jun 15, 1950  Today's Date: 05/04/2017 OT Individual Time: 1300-1357 OT Individual Time Calculation (min): 57 min    Short Term Goals: Week 1:  OT Short Term Goal 1 (Week 1): Pt will be able to sit >< stand with min A to prepare for LB dressing. OT Short Term Goal 2 (Week 1): Pt will be able to transfer on and off toilet with min A. OT Short Term Goal 3 (Week 1): Pt will demonstrate improved dynamic standing balance to be able to reach around to cleanse bottom with min A. OT Short Term Goal 4 (Week 1): Pt will don pants with min A. OT Short Term Goal 5 (Week 1): Pt will stay focused on ADL task with min cues.  Skilled Therapeutic Interventions/Progress Updates:    Pt received resting in recliner, agreeable to OT treatment session. Pt completed sit<>stand x1 during session with MinA to RW for increased standing endurance. Pt only able to maintain standing approx 5 seconds prior to initiating return to sitting due to fatigue. Pt completed UE/LE A/AAROM with adherence to sternal precautions for 5x each motion x2 sets. Min resistance provided during LE motions for added strengthening. Pt requires increased time and rest breaks throughout. Utilized this time for further discussion/education on CIR process and d/c planning/POC. Pt engaged in seated game of connect 4, with encouragement Pt sitting unsupported edge of recliner without forearm support to engage core. Pt intermittently requires UE support to maintain upright position due to fatigue. Pt left seated in recliner, call bell and needs within reach.   Therapy Documentation Precautions:  Precautions Precautions: Sternal, Fall Restrictions Weight Bearing Restrictions: No(sternal precautions) Other Position/Activity Restrictions: Sternal precautions      ADL: ADL ADL Comments: Refer to functional navigator  See Function Navigator for  Current Functional Status.   Therapy/Group: Individual Therapy  Orlando PennerBreanna L Raeanne Deschler 05/04/2017, 4:53 PM

## 2017-05-04 NOTE — Progress Notes (Signed)
Physical Therapy Make -up Session Note  Patient Details  Name: [name] MRN: [id number] Date of Birth: [birth year]  Today's Date: 05/04/2017 PT Individual Time: 0830-0913 PT Individual Time Calculation (min): 43 min   Short Term Goals: Week 1:  PT Short Term Goal 1 (Week 1): pt will consistently perform transfers with min A PT Short Term Goal 2 (Week 1): pt will perform gait x 30' in controlled environment with min A  Skilled Therapeutic Interventions/Progress Updates:    Pt presents in bed and agreeable to get up. Pt able to come to EOB with extra time and close supervision to maintain sternal precautions. With extra time, pt able to complete min assist stand step transfer with RW to w/c with cues for safe positioning of self and RW. Pt reports mild lightheadedness. BP taken - see below. Seated in w/c instructed in heel/toe raises x 20 and seated marches x 10 reps each for functional strengthening and muscular endurance and BP taken again with pt reporting improvement in symptoms. Pt declined any further attempts at standing this session. Pt set up at sink and completed face washing per request with set up assistance and using green swab for oral hygiene. Handoff to RN and transport arriving for MBS. Education provided throughout session in regards to d/c planning, energy conservation and endurance.   Therapy Documentation Precautions:  Precautions Precautions: Sternal, Fall Restrictions Weight Bearing Restrictions: (sternal precautions) Other Position/Activity Restrictions: Sternal precautions    Vital Signs: Therapy Vitals Pulse Rate: 76 BP: (!) 105/47 Patient Position (if appropriate): Sitting Oxygen Therapy SpO2: 92 % O2 Device: Nasal Cannula O2 Flow Rate (L/min): 2 L/min   Taken again after a few min of seated therex = 111/43 mmHg; HR = 75 bpm   Pain:  Denies  Pain.     See Function Navigator for Current Functional Status.   Therapy/Group: Individual  Therapy  Ronald StampsGray, [name], PT, DPT  05/04/2017, 9:22 AM

## 2017-05-04 NOTE — Progress Notes (Signed)
Social Work Patient ID: [name], male   DOB: August 23, 1950, 66 y.o.   MRN: [id number]    [name], Kongmeng Santoro R, [name]  Social Worker  General Practice  Patient Care Conference  Signed  Date of Service:  05/04/2017 11:22 AM          Signed          [] Hide copied text  [] Hover for details   Inpatient RehabilitationTeam Conference and Plan of Care Update Date: 05/03/2017   Time: 2:40 PM      Patient Name: [name]      Medical Record Number: [id number]  Date of Birth: August 23, 1950 Sex: Male         Room/Bed: 4W19C/4W19C-01 Payor Info: Payor: MEDICARE / Plan: MEDICARE PART B / Product Type: *No Product type* /     Admitting Diagnosis: Debility  Admit Date/Time:  04/27/2017  3:25 PM Admission Comments: No comment available    Primary Diagnosis:  <principal problem not specified> Principal Problem: <principal problem not specified>       Patient Active Problem List    Diagnosis Date Noted  . Labile blood pressure    . Supplemental oxygen dependent    . Confusion    . Hyperglycemia    . Hypokalemia    . Anoxic brain injury (HCC)    . Chronic systolic (congestive) heart failure (HCC)    . CAD in native artery    . Physical debility 04/27/2017  . 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 Acute respiratory failure with hypoxia (HCC)    . Hypoxemia    . Congestive heart failure (CHF) (HCC)    . Dysphagia    . Bacteremia    . Benign essential HTN    . Chronic neck pain    . Sepsis (HCC)    . Acute blood loss anemia    . Tachypnea    . Leukocytosis    . Fever    . Cardiogenic shock (HCC)    . Aneurysm of left ventricle of heart 04/12/2017  . Acute anterior wall MI (HCC)    . Acute pulmonary edema (HCC)    . Acute hypoxemic respiratory failure (HCC)    . ACS (acute coronary syndrome) (HCC)    . Acute heart failure (HCC)    . Encounter for central line placement    . Encounter for management of intra-aortic balloon pump    .  STEMI (ST elevation myocardial infarction) (HCC) 04/11/2017  . COPD (chronic obstructive pulmonary disease) (HCC) 04/11/2017  . CHF (congestive heart failure), NYHA class II, acute, combined (HCC) 04/11/2017      Expected Discharge Date: Expected Discharge Date: 05/13/17   Team Members Present: Physician leading conference: Dr. Maryla MorrowAnkit Patel Social Worker Present: Amada JupiterLucy Jillaine Waren, [name] Nurse Present: Kennon PortelaJeanna Hicks, RN PT Present: Judieth KeensKaren Donaworth, PT OT Present: Johnsie CancelAmy Lewis, OT SLP Present: Jackalyn LombardNicole Page, SLP PPS Coordinator present : Tora DuckMarie Noel, RN, CRRN       Current Status/Progress Goal Weekly Team Focus  Medical     Debility secondary to coronary artery disease and left ventricular rupture with acute systolic heart failure  Improve mobility, endurance, cognition, BP, respiratory, dysphagia  See above   Bowel/Bladder     Contient of Bladder, Incontient of bowel LBM 05/03/2017  Maintain continence of bladder.bowel  Assess toileting needs QS and prn, toilet Q 2 hrs,   Swallow/Nutrition/ Hydration     Dys. 1 textures with pudding thick liquids, Min A  Supervision  trials of upgraded liquids, repeat MBS   ADL's     sit to stand fluctuates from min to mod A depending on surface and time of day, min A UB dressing, mod A LB dressing, min A bathing, mod A toileting  supervision overall  activity tolerance, strengthening, balance, ADL training, pt/family education   Mobility     min A transfers and short distance gait  supervision overall  activity tolerance, strengthening, balance   Communication               Safety/Cognition/ Behavioral Observations   Mod-Max A  Min A  attention, problem solving, awareness    Pain     Denies pain   < 3  Assess QS discomfort of pain and prn,    Skin        No   Assess QS and prn maintaim skin integrity     Rehab Goals Patient on target to meet rehab goals: No Rehab Goals Revised: making limited progress due to ongoing cognitive impairments *See Care Plan  and progress notes for long and short-term goals.      Barriers to Discharge   Current Status/Progress Possible Resolutions Date Resolved   Physician     Medical stability;Decreased caregiver support;Lack of/limited family support;Behavior     See above  Therapies, follow labs, wean supplemental O2 as necessary, follow BP      Nursing                 PT                    OT                 SLP            SW              Discharge Planning/Teaching Needs:  Plan is for pt to d/c home with sister who can provide supervision, however, concern with current cognitive impairments which do not appear to be improving.  TBD   Team Discussion:  Still using supplemental O2, however, not clear if this is truly needed or more anxiety - based need.  Monitor BP and BSs.  Not meeting tx target goals at this point.  Very poor participation;  Plan MBS tomorrow.  Functionally at a supervision- min assist level, however, cognition is primary concern in terms of management in the home setting.  Revisions to Treatment Plan:  Not yet     Continued Need for Acute Rehabilitation Level of Care: The patient requires daily medical management by a physician with specialized training in physical medicine and rehabilitation for the following conditions: Daily direction of a multidisciplinary physical rehabilitation program to ensure safe treatment while eliciting the highest outcome that is of practical value to the patient.: Yes Daily medical management of patient stability for increased activity during participation in an intensive rehabilitation regime.: Yes Daily analysis of laboratory values and/or radiology reports with any subsequent need for medication adjustment of medical intervention for : Cardiac problems;Pulmonary problems;Blood pressure problems   Floyce Bujak 05/04/2017, 11:23 AM

## 2017-05-04 NOTE — Progress Notes (Signed)
Modified Barium Swallow Progress Note  Patient Details  Name: [name] MRN: [id number] Date of Birth: 01/12/1951  Today's Date: 05/04/2017  Modified Barium Swallow completed.  Full report located under Chart Review in the Imaging Section.  Brief recommendations include the following:  Clinical Impression    Pt continues to presents with functional deficits s/p prolonged intubation characterized by decreased laryngeal closure with penetration across all consistencies except purees.  Penetration with honey thick liquids is minimal, remains above the level of the cords and clears with minimal cues for use of a volitional throat clear/cough and a second swallow. Penetration with nectar thick liquids is deeper, in greater quantity and does not completely clear from the airway.  Thin liquids are eventually aspirated without sensation.  Given that pt is very cautious with his PO intake and can return demonstration of swallowing precautions with minimal assist, recommend that pt's diet be advanced to dys 2 textures, honey thick liquids.  Given overt cognitive impairment, full supervision during meals is indicated at this time.  Prognosis for further advancement is good with ongoing ST interventions for management of safe diet progression and continued increase in mobility with CIR level therapies.    Swallow Evaluation Recommendations       SLP Diet Recommendations: Dysphagia 2 (Fine chop) solids;Honey thick liquids   Liquid Administration via: Spoon   Medication Administration: Crushed with puree   Supervision: Patient able to self feed;Full supervision/cueing for compensatory strategies   Compensations: Slow rate;Minimize environmental distractions;Small sips/bites;Hard cough after swallow;Multiple dry swallows after each bite/sip   Postural Changes: Seated upright at 90 degrees            Taylinn Brabant, Joni ReiningNicole L 05/04/2017,7:54 PM

## 2017-05-04 NOTE — Patient Care Conference (Signed)
Inpatient RehabilitationTeam Conference and Plan of Care Update Date: 05/03/2017   Time: 2:40 PM    Patient Name: [name]      Medical Record Number: [id number]  Date of Birth: Nov 23, 1950 Sex: Male         Room/Bed: 4W19C/4W19C-01 Payor Info: Payor: MEDICARE / Plan: MEDICARE PART B / Product Type: *No Product type* /    Admitting Diagnosis: Debility  Admit Date/Time:  04/27/2017  3:25 PM Admission Comments: No comment available   Primary Diagnosis:  <principal problem not specified> Principal Problem: <principal problem not specified>  Patient Active Problem List   Diagnosis Date Noted  . Labile blood pressure   . Supplemental oxygen dependent   . Confusion   . Hyperglycemia   . Hypokalemia   . Anoxic brain injury (HCC)   . Chronic systolic (congestive) heart failure (HCC)   . CAD in native artery   . Physical debility 04/27/2017  . 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 Acute respiratory failure with hypoxia (HCC)   . Hypoxemia   . Congestive heart failure (CHF) (HCC)   . Dysphagia   . Bacteremia   . Benign essential HTN   . Chronic neck pain   . Sepsis (HCC)   . Acute blood loss anemia   . Tachypnea   . Leukocytosis   . Fever   . Cardiogenic shock (HCC)   . Aneurysm of left ventricle of heart 04/12/2017  . Acute anterior wall MI (HCC)   . Acute pulmonary edema (HCC)   . Acute hypoxemic respiratory failure (HCC)   . ACS (acute coronary syndrome) (HCC)   . Acute heart failure (HCC)   . Encounter for central line placement   . Encounter for management of intra-aortic balloon pump   . STEMI (ST elevation myocardial infarction) (HCC) 04/11/2017  . COPD (chronic obstructive pulmonary disease) (HCC) 04/11/2017  . CHF (congestive heart failure), NYHA class II, acute, combined (HCC) 04/11/2017    Expected Discharge Date: Expected Discharge Date: 05/13/17  Team Members Present: Physician leading conference: Dr. Maryla MorrowAnkit  Patel Social Worker Present: Amada JupiterLucy Kordel Leavy, [name] Nurse Present: Kennon PortelaJeanna Hicks, RN PT Present: Judieth KeensKaren Donaworth, PT OT Present: Johnsie CancelAmy Lewis, OT SLP Present: Jackalyn LombardNicole Page, SLP PPS Coordinator present : Tora DuckMarie Noel, RN, CRRN     Current Status/Progress Goal Weekly Team Focus  Medical   Debility secondary to coronary artery disease and left ventricular rupture with acute systolic heart failure  Improve mobility, endurance, cognition, BP, respiratory, dysphagia  See above   Bowel/Bladder   Contient of Bladder, Incontient of bowel LBM 05/03/2017  Maintain continence of bladder.bowel  Assess toileting needs QS and prn, toilet Q 2 hrs,   Swallow/Nutrition/ Hydration   Dys. 1 textures with pudding thick liquids, Min A  Supervision  trials of upgraded liquids, repeat MBS   ADL's   sit to stand fluctuates from min to mod A depending on surface and time of day, min A UB dressing, mod A LB dressing, min A bathing, mod A toileting  supervision overall  activity tolerance, strengthening, balance, ADL training, pt/family education   Mobility   min A transfers and short distance gait  supervision overall  activity tolerance, strengthening, balance   Communication             Safety/Cognition/ Behavioral Observations  Mod-Max A  Min A  attention, problem solving, awareness    Pain   Denies pain   < 3  Assess QS discomfort of  pain and prn,    Skin      No   Assess QS and prn maintaim skin integrity    Rehab Goals Patient on target to meet rehab goals: No Rehab Goals Revised: making limited progress due to ongoing cognitive impairments *See Care Plan and progress notes for long and short-term goals.     Barriers to Discharge  Current Status/Progress Possible Resolutions Date Resolved   Physician    Medical stability;Decreased caregiver support;Lack of/limited family support;Behavior     See above  Therapies, follow labs, wean supplemental O2 as necessary, follow BP      Nursing                  PT                     OT                  SLP                SW                Discharge Planning/Teaching Needs:  Plan is for pt to d/c home with sister who can provide supervision, however, concern with current cognitive impairments which do not appear to be improving.  TBD   Team Discussion:  Still using supplemental O2, however, not clear if this is truly needed or more anxiety - based need.  Monitor BP and BSs.  Not meeting tx target goals at this point.  Very poor participation;  Plan MBS tomorrow.  Functionally at a supervision- min assist level, however, cognition is primary concern in terms of management in the home setting.  Revisions to Treatment Plan:  Not yet     Continued Need for Acute Rehabilitation Level of Care: The patient requires daily medical management by a physician with specialized training in physical medicine and rehabilitation for the following conditions: Daily direction of a multidisciplinary physical rehabilitation program to ensure safe treatment while eliciting the highest outcome that is of practical value to the patient.: Yes Daily medical management of patient stability for increased activity during participation in an intensive rehabilitation regime.: Yes Daily analysis of laboratory values and/or radiology reports with any subsequent need for medication adjustment of medical intervention for : Cardiac problems;Pulmonary problems;Blood pressure problems  [name] 05/04/2017, 11:23 AM

## 2017-05-04 NOTE — Progress Notes (Signed)
Physical Therapy Note  Patient Details  Name: [name] MRN: [id number] Date of Birth: 1950/07/16 Today's Date: 05/04/2017  1526-1615, 49 min individual tx Pain:2/10 abdomen, pt thinks pain may be bowels, declined meds  Pt sleeping soundly, but easily awakened.  Pt stated he needed to use toilet. O2 sats at rest = 97%, HR 70, on 2 L O2 via wall port.   Bed> w/c stand pivot without use of UEs, min guard assist.  W/c> BSC over toilet with min assist for continent B and B. After transferring back to w/c, pt c/o feeling cold and thought his BP was low: BP in sitting 70/45, HR 67. PT tilted pt back in w/c and requested assistance. Transferred pt back to bed with +2 for safety.  Vitals in supine after a couple of minutes = 116/42, HR 72.  Jamie, NT and Ed, RN aware. Pt left resting in bed with alarm set and  all needs within reach, bed alarm set.  See function navigator for current status.  Hayden Mabin 05/04/2017, 3:56 PM

## 2017-05-05 ENCOUNTER — Inpatient Hospital Stay (HOSPITAL_COMMUNITY): Payer: Medicare Other | Admitting: Occupational Therapy

## 2017-05-05 ENCOUNTER — Inpatient Hospital Stay (HOSPITAL_COMMUNITY): Payer: Medicare Other | Admitting: Physical Therapy

## 2017-05-05 ENCOUNTER — Ambulatory Visit (HOSPITAL_COMMUNITY): Payer: Medicare Other | Admitting: Speech Pathology

## 2017-05-05 LAB — GLUCOSE, CAPILLARY
GLUCOSE-CAPILLARY: 99 mg/dL (ref 65–99)
Glucose-Capillary: 120 mg/dL — ABNORMAL HIGH (ref 65–99)
Glucose-Capillary: 90 mg/dL (ref 65–99)
Glucose-Capillary: 80 mg/dL (ref 65–99)
Glucose-Capillary: 89 mg/dL (ref 65–99)

## 2017-05-05 LAB — BASIC METABOLIC PANEL
Anion gap: 4 — ABNORMAL LOW (ref 5–15)
BUN: 18 mg/dL (ref 6–20)
CO2: 35 mmol/L — ABNORMAL HIGH (ref 22–32)
CREATININE: 0.76 mg/dL (ref 0.61–1.24)
Calcium: 8.3 mg/dL — ABNORMAL LOW (ref 8.9–10.3)
Chloride: 102 mmol/L (ref 101–111)
Glucose, Bld: 126 mg/dL — ABNORMAL HIGH (ref 65–99)
Potassium: 3.8 mmol/L (ref 3.5–5.1)
SODIUM: 141 mmol/L (ref 135–145)

## 2017-05-05 LAB — CBC WITH DIFFERENTIAL/PLATELET
Basophils Absolute: 0 K/uL (ref 0.0–0.1)
Basophils Relative: 0 %
Eosinophils Absolute: 0.2 K/uL (ref 0.0–0.7)
Eosinophils Relative: 2 %
HCT: 31 % — ABNORMAL LOW (ref 39.0–52.0)
Hemoglobin: 9.3 g/dL — ABNORMAL LOW (ref 13.0–17.0)
Lymphocytes Relative: 9 %
Lymphs Abs: 1.1 K/uL (ref 0.7–4.0)
MCH: 27.5 pg (ref 26.0–34.0)
MCHC: 30 g/dL (ref 30.0–36.0)
MCV: 91.7 fL (ref 78.0–100.0)
Monocytes Absolute: 0.6 K/uL (ref 0.1–1.0)
Monocytes Relative: 6 %
Neutro Abs: 9.6 K/uL — ABNORMAL HIGH (ref 1.7–7.7)
Neutrophils Relative %: 83 %
Platelets: 424 K/uL — ABNORMAL HIGH (ref 150–400)
RBC: 3.38 MIL/uL — ABNORMAL LOW (ref 4.22–5.81)
RDW: 16.3 % — ABNORMAL HIGH (ref 11.5–15.5)
WBC: 11.5 K/uL — ABNORMAL HIGH (ref 4.0–10.5)

## 2017-05-05 MED ORDER — QUETIAPINE FUMARATE 25 MG PO TABS: 25.0000 mg | ORAL_TABLET | Freq: Two times a day (BID) | ORAL | Status: DC

## 2017-05-05 MED ORDER — QUETIAPINE FUMARATE 25 MG PO TABS
25.0000 mg | ORAL_TABLET | Freq: Two times a day (BID) | ORAL | Status: AC
  Administered 2017-05-05 – 2017-05-06 (×4): 25 mg via ORAL
  Filled 2017-05-05 (×3): qty 1

## 2017-05-05 MED ORDER — QUETIAPINE FUMARATE 25 MG PO TABS
25.0000 mg | ORAL_TABLET | Freq: Every day | ORAL | Status: DC
Start: 2017-05-07 — End: 2017-05-07

## 2017-05-05 NOTE — Progress Notes (Signed)
   04/25/17 1610  PT G-Codes **NOT FOR INPATIENT CLASS**  Functional Assessment Tool Used AM-PAC 6 Clicks Basic Mobility  Functional Limitation Mobility: Walking and moving around  Mobility: Walking and Moving Around Current Status (Z6109(G8978) CK  Mobility: Walking and Moving Around Goal Status (U0454(G8979) CI   Inserting G-codes.  Ina HomesJaclyn Taleeyah Bora, PT, DPT Acute Rehab Services  Pager: [phone]

## 2017-05-05 NOTE — Progress Notes (Signed)
Nutrition Follow-up  DOCUMENTATION CODES:   Obesity unspecified  INTERVENTION:  Continue 30 ml Prostat po BID, each supplement provides 100 kcal and 15 grams of protein.   Provide Magic cup TID with meals, each supplement provides 290 kcal and 9 grams of protein.  Encourage adequate PO intake.   NUTRITION DIAGNOSIS:   Inadequate oral intake related to dysphagia as evidenced by meal completion < 50%; improving  GOAL:   Patient will meet greater than or equal to 90% of their needs; progressing  MONITOR:   PO intake, Supplement acceptance, Diet advancement, Labs, Weight trends, I & O's, Skin  REASON FOR ASSESSMENT:   Consult Calorie Count, Enteral/tube feeding initiation and management  ASSESSMENT:    66 y.o. male with history of CAD, HTN, COPD, chronic neck pain;  who was admitted on 04/11/17 with SOB and malaise due to inferior lateral STEMI, lethargy and cardiogenic shock. He was taken to OR for CABG with resection of LV aneurysm with placement of impella 11/14 and underwent chest closure on 11/17.  11/19 Impella removed  Diet has been advanced to a dysphagia 2 diet with honey thick liquids. Tube feeding formula has been discontinued. NGT continues in place for free water flushes for adequate fluids. Meal completion has been 40-70%. RD to continue with Prostat and magic cup to aid in caloric and protein needs.   Labs and medications reviewed.   Diet Order:  DIET DYS 2 Room service appropriate? Yes; Fluid consistency: Honey Thick  EDUCATION NEEDS:   Not appropriate for education at this time  Skin:  Skin Assessment: Reviewed RN Assessment  Last BM:  12/6  Height:   Ht Readings from Last 1 Encounters:  05/01/17 5\' 7"  (1.702 m)    Weight:   Wt Readings from Last 1 Encounters:  05/05/17 198 lb 13.7 oz (90.2 kg)    Ideal Body Weight:  67.27 kg  BMI:  Body mass index is 31.15 kg/m.  Estimated Nutritional Needs:   Kcal:  2000-2200  Protein:   100-115  Fluid:  >/= 2 L/day    Roslyn SmilingStephanie Shellsea Borunda, MS, RD, LDN Pager # [phone] After hours/ weekend pager # [phone]

## 2017-05-05 NOTE — Progress Notes (Signed)
Physical Therapy Note  Patient Details  Name: [name] MRN: [id number] Date of Birth: 12/15/1950 Today's Date: 05/05/2017    Time: 1130-1155 25 minutes  1:1 Pt with no c/o pain.  Pt finishing eating pudding cup with mirror with increased time but no cuing.  Pt refuses standing and ambulation but agreeable to w/c propulsion.  Pt propels w/c with bilat LEs x 150' with occasional min A for obstacle negotiation.  Pt left in room with needs at hand.   [name] 05/05/2017, 12:39 PM

## 2017-05-05 NOTE — Progress Notes (Signed)
Instructed, re-instructe pt I un use of Acapella/Flutter device.  Good effort and technique with instruction and direction.  encouraged 10 efforts every hour w/a

## 2017-05-05 NOTE — Progress Notes (Signed)
[facility]     PROGRESS NOTE    Subjective/Complaints: Patient seen lying in bed this morning. He states he slept well overnight. He is happy about the fact that his diet was advanced.  ROS: Denies CP, SOB, N/V/D.  Objective: Vital Signs: Blood pressure 128/88, pulse 74, temperature 98.1 F (36.7 C), temperature source Oral, resp. rate 18, height 5\' 7"  (1.702 m), weight 90.2 kg (198 lb 13.7 oz), SpO2 99 %. Dg Swallowing Func-speech Pathology  Result Date: 05/04/2017 Objective Swallowing Evaluation: Type of Study: MBS-Modified Barium Swallow Study  Patient Details Name: [name] MRN: [id number] Date of Birth: 16-Dec-1950 Today's Date: 05/04/2017 Past Medical History: Past Medical History: Diagnosis Date . Coronary artery disease  . Hypertension  Past Surgical History: Past Surgical History: Procedure Laterality Date . APPLICATION OF WOUND VAC N/A 04/14/2017  Procedure: APPLICATION OF WOUND VAC with reposition of Impella device;  Surgeon: Delight OvensGerhardt, Edward B, MD;  [facility];  Service: Thoracic;  Laterality: N/A; . CORONARY STENT INTERVENTION N/A 04/11/2017  Procedure: CORONARY STENT INTERVENTION;  Surgeon: Corky CraftsVaranasi, Jayadeep S, MD;  Location: [facility] INVASIVE CV LAB;  Service: Cardiovascular;  Laterality: N/A; . CORONARY THROMBECTOMY N/A 04/11/2017  Procedure: Coronary Thrombectomy;  Surgeon: Corky CraftsVaranasi, Jayadeep S, MD;  [facility];  Service: Cardiovascular;  Laterality: N/A; . CORONARY/GRAFT ACUTE MI REVASCULARIZATION N/A 04/11/2017  Procedure: Coronary/Graft Acute MI Revascularization;  Surgeon: Corky CraftsVaranasi, Jayadeep S, MD;  [facility];  Service: Cardiovascular;  Laterality: N/A; . IABP INSERTION N/A 04/11/2017  Procedure: IABP Insertion;  Surgeon: Corky CraftsVaranasi, Jayadeep S, MD;  [facility];  Service: Cardiovascular;  Laterality: N/A; . LEFT HEART CATH AND CORONARY ANGIOGRAPHY N/A 04/11/2017  Procedure: LEFT HEART CATH  AND CORONARY ANGIOGRAPHY;  Surgeon: Corky CraftsVaranasi, Jayadeep S, MD;  [facility];  Service: Cardiovascular;  Laterality: N/A; . PLACEMENT OF IMPELLA LEFT VENTRICULAR ASSIST DEVICE  04/12/2017  Procedure: PLACEMENT OF IMPELLA  LD;  Surgeon: Delight OvensGerhardt, Edward B, MD;  [facility];  Service: Open Heart Surgery;; . REMOVAL OF IMPELLA LEFT VENTRICULAR ASSIST DEVICE N/A 04/17/2017  Procedure: REMOVAL OF IMPELLA LEFT VENTRICULAR ASSIST DEVICE;  Surgeon: Delight OvensGerhardt, Edward B, MD;  [facility];  Service: Open Heart Surgery;  Laterality: N/A; . REPAIR OF RIGHT VENTRICLE LACERATION Left 04/12/2017  Procedure: Resection of LV Aneurysm;  Surgeon: Delight OvensGerhardt, Edward B, MD;  [facility];  Service: Open Heart Surgery;  Laterality: Left; . STERNAL CLOSURE N/A 04/14/2017  Procedure: STERNAL CLOSURE;  Surgeon: Delight OvensGerhardt, Edward B, MD;  [facility];  Service: Thoracic;  Laterality: N/A; . TEE WITHOUT CARDIOVERSION N/A 04/14/2017  Procedure: TRANSESOPHAGEAL ECHOCARDIOGRAM (TEE);  Surgeon: Delight OvensGerhardt, Edward B, MD;  [facility];  Service: Thoracic;  Laterality: N/A; . ULTRASOUND GUIDANCE FOR VASCULAR ACCESS  04/11/2017  Procedure: Ultrasound Guidance For Vascular Access;  Surgeon: Corky CraftsVaranasi, Jayadeep S, MD;  [facility];  Service: Cardiovascular;; HPI: [name] is 66 year old with h/o MI, HTN, COPD, CAD admitted with chest pian found to have inferior/lateral STEMI. Intubated 11/14-11/26. Underwent angioplasty to mid lad with 25% residual stenosis. CXR stable lung volumes. Left greater than right lower lobe collapse or consolidation suspected. Stable to mildly increased pulmonary vascularity without overt edema.  Pt started on a Dys 1, pudding thick liquids diet per MBS on 11/28.  Repeat MBS ordered to determine readiness to advance.   Assessment / Plan / Recommendation CHL IP CLINICAL IMPRESSIONS 05/04/2017 Clinical Impression  Pt continues to presents with functional deficits s/p prolonged  intubation characterized by decreased laryngeal closure with penetration across all consistencies except purees.  Penetration with honey thick liquids is minimal, remains above the level of the cords and clears with minimal cues for use of a volitional throat clear/cough and a second swallow. Penetration with nectar thick liquids is deeper, in greater quantity and does not completely clear from the airway.  Thin liquids are eventually aspirated without sensation.  Given that pt is very cautious with his PO intake and can return demonstration of swallowing precautions with minimal assist, recommend that pt's diet be advanced to dys 2 textures, honey thick liquids.  Given overt cognitive impairment, full supervision during meals is indicated at this time.  Prognosis for further advancement is good with ongoing ST interventions for management of safe diet progression and continued increase in mobility with CIR level therapies.    SLP Visit Diagnosis Dysphagia, pharyngeal phase (R13.13) Attention and concentration deficit following -- Frontal lobe and executive function deficit following -- Impact on safety and function Moderate aspiration risk;Mild aspiration risk     Prognosis 05/04/2017 Prognosis for Safe Diet Advancement Good Barriers to Reach Goals Cognitive deficits Barriers/Prognosis Comment -- CHL IP DIET RECOMMENDATION 05/04/2017 SLP Diet Recommendations Dysphagia 2 (Fine chop) solids;Honey thick liquids Liquid Administration via Spoon Medication Administration Crushed with puree Compensations Slow rate;Minimize environmental distractions;Small sips/bites;Hard cough after swallow;Multiple dry swallows after each bite/sip Postural Changes Seated upright at 90 degrees            CHL IP ORAL PHASE 05/04/2017 Oral Phase Impaired Oral - Pudding Teaspoon -- Oral - Pudding Cup -- Oral - Honey Teaspoon WFL Oral - Honey Cup Piecemeal swallowing Oral - Nectar Teaspoon -- Oral - Nectar Cup Premature spillage;Piecemeal  swallowing Oral - Nectar Straw -- Oral - Thin Teaspoon -- Oral - Thin Cup Piecemeal swallowing;Premature spillage Oral - Thin Straw -- Oral - Puree WFL Oral - Mech Soft Impaired mastication Oral - Regular -- Oral - Multi-Consistency -- Oral - Pill -- Oral Phase - Comment --  CHL IP PHARYNGEAL PHASE 05/04/2017 Pharyngeal Phase Impaired Pharyngeal- Pudding Teaspoon -- Pharyngeal -- Pharyngeal- Pudding Cup -- Pharyngeal -- Pharyngeal- Honey Teaspoon Penetration/Aspiration during swallow;Reduced airway/laryngeal closure Pharyngeal Material enters airway, remains ABOVE vocal cords and not ejected out Pharyngeal- Honey Cup Penetration/Aspiration during swallow;Reduced airway/laryngeal closure Pharyngeal Material enters airway, remains ABOVE vocal cords and not ejected out Pharyngeal- Nectar Teaspoon -- Pharyngeal -- Pharyngeal- Nectar Cup Reduced airway/laryngeal closure;Penetration/Aspiration during swallow Pharyngeal Material enters airway, CONTACTS cords and not ejected out Pharyngeal- Nectar Straw -- Pharyngeal -- Pharyngeal- Thin Teaspoon -- Pharyngeal -- Pharyngeal- Thin Cup Reduced airway/laryngeal closure;Penetration/Aspiration during swallow Pharyngeal Material enters airway, passes BELOW cords without attempt by patient to eject out (silent aspiration) Pharyngeal- Thin Straw -- Pharyngeal -- Pharyngeal- Puree Reduced airway/laryngeal closure Pharyngeal -- Pharyngeal- Mechanical Soft Reduced airway/laryngeal closure Pharyngeal -- Pharyngeal- Regular -- Pharyngeal -- Pharyngeal- Multi-consistency -- Pharyngeal -- Pharyngeal- Pill -- Pharyngeal -- Pharyngeal Comment --  CHL IP CERVICAL ESOPHAGEAL PHASE 05/04/2017 Cervical Esophageal Phase WFL Pudding Teaspoon -- Pudding Cup -- Honey Teaspoon -- Honey Cup -- Nectar Teaspoon -- Nectar Cup -- Nectar Straw -- Thin Teaspoon -- Thin Cup -- Thin Straw -- Puree -- Mechanical Soft -- Regular -- Multi-consistency -- Pill -- Cervical Esophageal Comment -- No flowsheet data  found. Page, Melanee Spry 05/04/2017, 7:44 PM              Recent Labs  05/05/17 0429  WBC 11.5*  HGB 9.3*  HCT 31.0*  PLT 424*   Recent Labs    05/05/17 0429  NA 141  K 3.8  CL 102  GLUCOSE 126*  BUN 18  CREATININE 0.76  CALCIUM 8.3*   CBG (last 3)  Recent Labs    05/04/17 2347 05/05/17 0417 05/05/17 0850  GLUCAP 120* 120* 90    Wt Readings from Last 3 Encounters:  05/05/17 90.2 kg (198 lb 13.7 oz)  04/27/17 88.3 kg (194 lb 10.7 oz)    Physical Exam:  Constitutional: He appearswell-developedand well-nourished.No distress. HENT: Normocephalicand atraumatic.  Eyes:EOMare normal. No discharge.  Cardiovascular: Normal rate. +murmur. No JVD Respiratory:Lungs clear. Unlabored.  XL:[id number]:Bowel sounds are normal. He exhibitsno distension.  +NG Musculoskeletal: He exhibits no edema, no tenderness. Neurological: He isalert. He can follow simple commands.   Motor: B/l UE 4-/5 proximal to distal  B/l LE: HF 2/5, KE 4/5, ADF/PF 4/5 (unchanged) Skin iswarmand dry. He isnot diaphoretic.Sternal incision is intact.   Psychiatric: Pleasant. Tangential  Assessment/Plan: 1.  Functional deficits and decreased mobility secondary to debility after multiple medical complications which require 3+ hours per day of interdisciplinary therapy in a comprehensive inpatient rehab setting. Physiatrist is providing close team supervision and 24 hour management of active medical problems listed below. Physiatrist and rehab team continue to assess barriers to discharge/monitor patient progress toward functional and medical goals.  Function:  Bathing Bathing position   Position: Sitting EOB  Bathing parts Body parts bathed by patient: Right upper leg, Front perineal area, Left upper leg(LB only this am) Body parts bathed by helper: Right lower leg, Left lower leg, Buttocks  Bathing assist        Upper Body Dressing/Undressing Upper body dressing   What is the patient wearing?:  Pull over shirt/dress     Pull over shirt/dress - Perfomed by patient: Thread/unthread right sleeve, Thread/unthread left sleeve, Pull shirt over trunk Pull over shirt/dress - Perfomed by helper: Put head through opening        Upper body assist Assist Level: Supervision or verbal cues      Lower Body Dressing/Undressing Lower body dressing   What is the patient wearing?: Pants, Shoes     Pants- Performed by patient: Thread/unthread right pants leg, Thread/unthread left pants leg, Pull pants up/down Pants- Performed by helper: Thread/unthread right pants leg, Thread/unthread left pants leg   Non-skid slipper socks- Performed by helper: Don/doff right sock, Don/doff left sock   Socks - Performed by helper: Don/doff right sock, Don/doff left sock   Shoes - Performed by helper: Don/doff right shoe, Don/doff left shoe          Lower body assist Assist for lower body dressing: Touching or steadying assistance (Pt > 75%)(with use of reacher and long shoe horn)      Toileting Toileting     Toileting steps completed by helper: Adjust clothing prior to toileting, Performs perineal hygiene, Adjust clothing after toileting Toileting Assistive Devices: Grab bar or rail  Toileting assist Assist level: Touching or steadying assistance (Pt.75%)   Transfers Chair/bed transfer   Chair/bed transfer method: Stand pivot Chair/bed transfer assist level: 2 helpers(orthostatic; for safety) Chair/bed transfer assistive device: Patent attorneyWalker     Locomotion Ambulation     Max distance: 8 Assist level: Touching or steadying assistance (Pt > 75%)   Wheelchair   Type: Manual Max wheelchair distance: 50' Assist Level: Touching or steadying assistance (Pt > 75%)  Cognition Comprehension Comprehension assist level:  Follows basic conversation/direction with extra time/assistive device  Expression Expression assist level: Expresses basic needs/ideas: With extra time/assistive device  Social  Interaction Social Interaction assist level: Interacts appropriately 75 - 89% of the time - Needs redirection for appropriate language or to initiate interaction.  Problem Solving Problem solving assist level: Solves basic 50 - 74% of the time/requires cueing 25 - 49% of the time  Memory Memory assist level: Recognizes or recalls 50 - 74% of the time/requires cueing 25 - 49% of the time   Medical Problem List and Plan: 1.   Debility secondary to coronary artery disease and left ventricular rupture with acute systolic heart failure  Cont CIR 2. DVT Prophylaxis/Anticoagulation: Pharmaceutical:Lovenox 3. Pain Management: Use acetaminophen as needed 4. Mood:[name] to follow for evaluation and support.   Requires significant amount of motivation 5. Neuropsych: This patientis not fullycapable of making decisions on hisown behalf. 6. Skin/Wound Care:Monitor surgical wounds daily for healing . 7. Fluids/Electrolytes/Nutrition:Monitor I/O. Added water flushes qid. .   Advanced to D2 honey  Will DC nightly tube feeds, but continue NG for fluids. Encourage fluids    Magnesium within normal limits on 12/6 8. ESBL E coli (penile meatus): Completed meropenem and Vancomycin  9. Hypokalemia: Being Managed with runs of K+. Added supplement via NG.  K+ 3.8 on 12/7  Con to monitor 10.Anoxic BI: Continue Seroquel and wean over next few days.   Establish better sleep wake cycle 11. Leukocytosis: Continue to monitor for trends   Afebrile   WBCs 11.5 on 12/7, improving  Cont to monitor 12. ZOX:[id number] 200 mg bid and coreg addedon11/29 13. COPD with chronic hypoxic/hypercarbic failure:  Wean supplemental O2 as able. Encouraging IS 14. Acute systolic heart failure due to cardiogenic shock: EF improved to 40%. No lasix. Monitor weight for signs of overload. Check weights daily.  Filed Weights   05/02/17 0400 05/03/17 0450 05/05/17 0427  Weight: 91.4 kg (201 lb 8.4 oz) 91 kg (200 lb 9.9 oz)  90.2 kg (198 lb 13.7 oz)   15. ABLA: Monitor H/H serially.    Hb  9.3 on 12/7   Cont to monitor 16. Hyperglycemia  Due to TFs, DC'd on 12/7   Relatively controlled on 12/7  Cont to monitor 17. Labile BP  Relatively controlled on 12/7   LOS (Days) 8 A FACE TO FACE EVALUATION WAS PERFORMED  Jennier Schissler Karis Juba, MD 05/05/2017 9:34 AM

## 2017-05-05 NOTE — Progress Notes (Signed)
Social Work Patient ID: [name], male   DOB: 1951/03/23, 66 y.o.   MRN: [id number]  Have reviewed team conference with pt and sister, [name].  Both aware of targeted d/c date of 12/15 and supervision goals.  Sister questions if he will be ready at that date and states, "I thought it might be more like the 20th." and believes  the snow might delay d/c.  Discussed concerns of slow cognitive improvement and it's affect on his functional gains.  Pt listening but does not engage much.  He does try to reassure sister, "Ronald LabradorWe'll work it out" in regards to d/c.   Sister still plans for pt to come home with her "...but he needs to be a lot better...".  Discussed need for family ed next week and sister notes she will be here as the weather/ snow will allow her.    Ronald Dziedzic, [name]

## 2017-05-05 NOTE — Progress Notes (Signed)
Pt closely monitored and cooperative with intermittent confusion,respiration unlabored on nasal O2 Sat, 97% ,aroused easily and cooperative some anxiety noted but refuse medication Denies pain upon questioning, coloration adequate, no indication of resp. distress/discomfort. Cortrak tube patent to right nare w/o skin irritation or breakage ,patency remain Viital 1.5 infusing at 80cc/hr with 200 cc water flush every 6 hours.Contact Isolation continue - ESBL  Incontinent of B/B, prn care provided,Urinal offered this shift.Callbell within reach, assisted prn.

## 2017-05-05 NOTE — Progress Notes (Signed)
Physical Therapy Weekly Progress Note  Patient Details  Name: [name] MRN: [id number] Date of Birth: 1950-09-18  Beginning of progress report period: April 28, 2017 End of progress report period: May 05, 2017   Patient has met 2 of 2 short term goals.  Pt is improving physically and is min A/supervision for short distance gait and transfers. Pt limited by anxiety, impaired cognition and decreased participation in therapy.  Pt's plan of care changed to 15/7 due to decreased endurance for therapies.   Patient continues to demonstrate the following deficits muscle weakness, decreased cardiorespiratoy endurance and decreased oxygen support, decreased awareness, decreased problem solving and delayed processing and decreased standing balance, decreased balance strategies and difficulty maintaining precautions and therefore will continue to benefit from skilled PT intervention to increase functional independence with mobility.  Patient progressing toward long term goals..  Plan of care revisions: 15/7.  PT Short Term Goals Week 1:  PT Short Term Goal 1 (Week 1): pt will consistently perform transfers with min A PT Short Term Goal 1 - Progress (Week 1): Met PT Short Term Goal 2 (Week 1): pt will perform gait x 30' in controlled environment with min A PT Short Term Goal 2 - Progress (Week 1): Met Week 2:  PT Short Term Goal 1 (Week 2): =LTG  Skilled Therapeutic Interventions/Progress Updates:  Ambulation/gait training;DME/adaptive equipment instruction;Neuromuscular re-education;Stair training;UE/LE Strength taining/ROM;Wheelchair propulsion/positioning;UE/LE Coordination activities;Therapeutic Activities;Pain management;Discharge planning;Balance/vestibular training;Cognitive remediation/compensation;Functional mobility training;Patient/family education;Splinting/orthotics;Therapeutic Exercise;Community reintegration     See Function Navigator for Current Functional  Status.   [name] 05/05/2017, 8:14 AM

## 2017-05-05 NOTE — Progress Notes (Signed)
Speech Language Pathology Weekly Progress and Session Note  Patient Details  Name: Eathon Valade MRN: [id number] Date of Birth: [birth year]  Beginning of progress report period:  April 28, 2017 End of progress report period: May 05, 2017   Today's Date: 05/05/2017 SLP Individual Time: 7619-5093 SLP Individual Time Calculation (min): 32 min  Short Term Goals: Week 1: SLP Short Term Goal 1 (Week 1): Pt will consume therapeutic trials of ice chips with minimal overt s/s of aspiration over 3 consecutive sessions prior to repeat objective assessment.   SLP Short Term Goal 1 - Progress (Week 1): Other (comment)(not addressed to allow focus on toleration of recent diet advancement ) SLP Short Term Goal 2 (Week 1): Pt will sustain his attention to functional tasks for 5 minutes with min verbal cues cor redirection.   SLP Short Term Goal 2 - Progress (Week 1): Met SLP Short Term Goal 3 (Week 1): Pt will maintain a topic of conversation for at least 5 turns with min verbal cues.    SLP Short Term Goal 3 - Progress (Week 1): Met SLP Short Term Goal 4 (Week 1): Pt will complete basic, functional tasks with min verbal cues for problem solving.   SLP Short Term Goal 4 - Progress (Week 1): Met SLP Short Term Goal 5 (Week 1): Pt will consume dys 1 textures and pudding thick liquids with supervision cues for use of swallowing precautions.   SLP Short Term Goal 5 - Progress (Week 1): Met SLP Short Term Goal 6 (Week 1): Pt will return demonstration of at least 2 safety precautions with mod assist verbal cues.   SLP Short Term Goal 6 - Progress (Week 1): Met    New Short Term Goals: Week 2: SLP Short Term Goal 1 (Week 2): Pt will consume therapeutic trials of ice chips with minimal overt s/s of aspiration over 3 consecutive sessions prior to repeat objective assessment.   SLP Short Term Goal 2 (Week 2): Pt will sustain his attention to functional tasks for 10 minutes with min verbal cues for  redirection.   SLP Short Term Goal 3 (Week 2): Pt will maintain a topic of conversation for at least 5 turns with supervision verbal cues.    SLP Short Term Goal 4 (Week 2): Pt will complete basic, functional tasks with supervision verbal cues for problem solving.   SLP Short Term Goal 5 (Week 2): Pt will consume dys 2 textures and honey thick liquids with supervision cues for use of swallowing precautions.   SLP Short Term Goal 6 (Week 2): Pt will return demonstration of at least 2 safety precautions with min assist verbal cues.    Weekly Progress Updates:  Pt has made functional gains this reporting period and has met 5 out of 5 targeted short term goals.  Pt's diet has been advanced to dys 2 textures and honey thick liquids which he is consuming with at most supervision verbal cues for use of swallowing precautions.  Pt's mentation appears clearer in comparison to initial evaluation as evidenced by pt's ability to sustain his attention to tasks for longer periods of time and maintain a topic of conversation.  Pt remains limited by anxiety but is more willing to participate in therapy with encouragement.  Pt education is ongoing.  As a result, pt would continue to benefit from skilled ST while inpatient in order to maximize functional independence and reduce burden of care prior to discharge.  Anticipate that pt will need ST follow up  at next level of care in addition to 24/7 supervision at discharge.     Intensity: Minumum of 1-2 x/day, 30 to 90 minutes Frequency: 3 to 5 out of 7 days Duration/Length of Stay: 21 days  Treatment/Interventions: Cognitive remediation/compensation;Dysphagia/aspiration precaution training;Internal/external aids;Cueing hierarchy;Environmental controls;Functional tasks;Patient/family education   Daily Session  Skilled Therapeutic Interventions:  Pt was seen for skilled ST targeting dysphagia and cognitive goals.  Pt recalled swallowing precautions following yesterday's  MBS with supervision question cues.  Pt then utilized volitional cough followed by second swallow with mod I when consuming presentations of his current diet during lunch.  No overt s/s of aspiration were evident with solids or liquids and pt was able to clear solids from the oral cavity without difficulty.  Pt demonstrated appropriate awareness of swallowing precautions and their rationale but appeared anxious about "drowning' on any consistencies outside of his currently recommended diet.  Therapist provided reassurance and encouragement, providing specific examples of his current progress.  Pt was left in bed with bed alarm set and sister at bedside.  Goals updated on this date to reflect current progress and plan of care.         Function:   Eating Eating                 Cognition Comprehension Comprehension assist level: Follows basic conversation/direction with extra time/assistive device  Expression   Expression assist level: Expresses basic needs/ideas: With extra time/assistive device  Social Interaction Social Interaction assist level: Interacts appropriately 75 - 89% of the time - Needs redirection for appropriate language or to initiate interaction.  Problem Solving Problem solving assist level: Solves basic 75 - 89% of the time/requires cueing 10 - 24% of the time  Memory Memory assist level: Recognizes or recalls 75 - 89% of the time/requires cueing 10 - 24% of the time   General    Pain Pain Assessment Pain Assessment: No/denies pain  Therapy/Group: Individual Therapy  Daleen Steinhaus, Selinda Orion 05/05/2017, 4:08 PM

## 2017-05-05 NOTE — Progress Notes (Signed)
Occupational Therapy Weekly Progress Note  Patient Details  Name: [name] MRN: [id number] Date of Birth: 10/19/1950  Beginning of progress report period: April 28, 2017 End of progress report period: May 05, 2017  Today's Date: 05/05/2017 OT Individual Time: 1030-1130 OT Individual Time Calculation (min): 60 min    Patient has met 5 of 5 short term goals.  Pt has made progress with his self care so that he is only requiring min A overall.  He is very anxious about over exerting himself and does not want to have his O2 removed despite the fact he has been able to tolerate room air. Pt is self limiting and need significant encouragement to participate in therapies.    Patient continues to demonstrate the following deficits: muscle weakness, decreased cardiorespiratoy endurance and decreased oxygen support, delayed processing and decreased standing balance and decreased balance strategies and therefore will continue to benefit from skilled OT intervention to enhance overall performance with BADL.  Patient progressing toward long term goals..  Continue plan of care.  OT Short Term Goals Week 1:  OT Short Term Goal 1 (Week 1): Pt will be able to sit >< stand with min A to prepare for LB dressing. OT Short Term Goal 1 - Progress (Week 1): Met OT Short Term Goal 2 (Week 1): Pt will be able to transfer on and off toilet with min A. OT Short Term Goal 2 - Progress (Week 1): Met OT Short Term Goal 3 (Week 1): Pt will demonstrate improved dynamic standing balance to be able to reach around to cleanse bottom with min A. OT Short Term Goal 3 - Progress (Week 1): Met OT Short Term Goal 4 (Week 1): Pt will don pants with min A. OT Short Term Goal 4 - Progress (Week 1): Met OT Short Term Goal 5 (Week 1): Pt will stay focused on ADL task with min cues. OT Short Term Goal 5 - Progress (Week 1): Met Week 2:  OT Short Term Goal 1 (Week 2): STGs = LTGs  Skilled Therapeutic  Interventions/Progress Updates:    Pt received in bed with a friend in the room for the first part of the session. He needed significant encouragement to get out of bed as he was worried about having low blood pressure. After he sat to EOB with min A, blood pressure checked and it was WNL.  Pt did not want to have O2 removed but did agree to have it removed for several minutes as he was washing and dressing UB.  O2 sat 95%.  Pt worked on standing from bed to cleanse perineal area with close S and A to reach buttocks.  Pt transferred to w/c.  He was very focused on not having enough to eat for breakfast. Pt taken to day room and ate a chocolate pudding with S and cues to take small bites. Hand off to PT.  Therapy Documentation Precautions:  Precautions Precautions: Sternal, Fall Restrictions Weight Bearing Restrictions: No(sternal precautions) Other Position/Activity Restrictions: Sternal precautions  Vital Signs: sitting BP 120/91  Pain:  no c/o pain ADL: ADL ADL Comments: Refer to functional navigator  See Function Navigator for Current Functional Status.   Therapy/Group: Individual Therapy  [place] 05/05/2017, 12:44 PM

## 2017-05-05 NOTE — Progress Notes (Signed)
SLP late entry    04/26/17 1500  SLP G-Codes **NOT FOR INPATIENT CLASS**  Functional Assessment Tool Used skilled clinical judgement  Functional Limitations Swallowing  Swallow Current Status (Z6109(G8996) CL  Swallow Goal Status (U0454(G8997) CJ  SLP Evaluations  $ SLP Speech Visit 1 Visit  SLP Evaluations  $MBS Swallow 1 Procedure   Ronald CoonsLisa [name] [place] M[place]Ed [employer]P Pager [phone]

## 2017-05-05 NOTE — Progress Notes (Signed)
SLP addendum    04/25/17 0907  SLP G-Codes **NOT FOR INPATIENT CLASS**  Functional Assessment Tool Used skilled clinical judgement  Functional Limitations Swallowing  Swallow Current Status (Z6109(G8996) CK  Swallow Goal Status (U0454(G8997) CJ  SLP Evaluations  $ SLP Speech Visit 1 Visit  SLP Evaluations  $BSS Swallow 1 Procedure    Breck CoonsLisa [name] [place] Ground[place] M.Ed [employer]-SLP Pager [phone]

## 2017-05-06 ENCOUNTER — Inpatient Hospital Stay (HOSPITAL_COMMUNITY): Payer: Medicare Other | Admitting: Speech Pathology

## 2017-05-06 ENCOUNTER — Inpatient Hospital Stay (HOSPITAL_COMMUNITY): Payer: Medicare Other | Admitting: Occupational Therapy

## 2017-05-06 ENCOUNTER — Inpatient Hospital Stay (HOSPITAL_COMMUNITY): Payer: Medicare Other | Admitting: Physical Therapy

## 2017-05-06 LAB — GLUCOSE, CAPILLARY
GLUCOSE-CAPILLARY: 90 mg/dL (ref 65–99)
GLUCOSE-CAPILLARY: 99 mg/dL (ref 65–99)
GLUCOSE-CAPILLARY: 111 mg/dL — AB (ref 65–99)
Glucose-Capillary: 87 mg/dL (ref 65–99)

## 2017-05-06 NOTE — Progress Notes (Signed)
[facility] PHYSICAL MEDICINE & REHABILITATION     PROGRESS NOTE    Subjective/Complaints:  No issues overnite, poor po intake  ROS: Denies CP, SOB, N/V/D.  Objective: Vital Signs: Blood pressure (!) 136/58, pulse 73, temperature (!) 97.3 F (36.3 C), temperature source Oral, resp. rate 18, height 5\' 7"  (1.702 m), weight 90.7 kg (199 lb 15.3 oz), SpO2 98 %. Dg Swallowing Func-speech Pathology  Result Date: 05/04/2017 Objective Swallowing Evaluation: Type of Study: MBS-Modified Barium Swallow Study  Patient Details Name: [name] MRN: [id number] Date of Birth: [birth year] Today's Date: 05/04/2017 Past Medical History: Past Medical History: Diagnosis Date . Coronary artery disease  . Hypertension  Past Surgical History: Past Surgical History: Procedure Laterality Date . APPLICATION OF WOUND VAC N/A 04/14/2017  Procedure: APPLICATION OF WOUND VAC with reposition of Impella device;  Surgeon: Delight Ovens, MD;  Location[facility] OR;  Service: Thoracic;  Laterality: N/A; . CORONARY STENT INTERVENTION N/A 04/11/2017  Procedure: CORONARY STENT INTERVENTION;  Surgeon: Corky Crafts, MD;  Location: [facility] INVASIVE CV LAB;  Service: Cardiovascular;  Laterality: N/A; . CORONARY THROMBECTOMY N/A 04/11/2017  Procedure: Coronary Thrombectomy;  Surgeon: Corky Crafts, MD;  Location: [facility] INVASIVE CV LAB;  Service: Cardiovascular;  Laterality: N/A; . CORONARY/GRAFT ACUTE MI REVASCULARIZATION N/A 04/11/2017  Procedure: Coronary/Graft Acute MI Revascularization;  Surgeon: Corky Crafts, MD;  Location: [facility] INVASIVE CV LAB;  Service: Cardiovascular;  Laterality: N/A; . IABP INSERTION N/A 04/11/2017  Procedure: IABP Insertion;  Surgeon: Corky Crafts, MD;  Location: [facility] INVASIVE CV LAB;  Service: Cardiovascular;  Laterality: N/A; . LEFT HEART CATH AND CORONARY ANGIOGRAPHY N/A 04/11/2017  Procedure: LEFT HEART CATH AND CORONARY ANGIOGRAPHY;  Surgeon: Corky Crafts, MD;  Location: [facility] INVASIVE CV  LAB;  Service: Cardiovascular;  Laterality: N/A; . PLACEMENT OF IMPELLA LEFT VENTRICULAR ASSIST DEVICE  04/12/2017  Procedure: PLACEMENT OF IMPELLA  LD;  Surgeon: Delight Ovens, MD;  Location: [facility] OR;  Service: Open Heart Surgery;; . REMOVAL OF IMPELLA LEFT VENTRICULAR ASSIST DEVICE N/A 04/17/2017  Procedure: REMOVAL OF IMPELLA LEFT VENTRICULAR ASSIST DEVICE;  Surgeon: Delight Ovens, MD;  Location: [facility] OR;  Service: Open Heart Surgery;  Laterality: N/A; . REPAIR OF RIGHT VENTRICLE LACERATION Left 04/12/2017  Procedure: Resection of LV Aneurysm;  Surgeon: Delight Ovens, MD;  Location: [facility] OR;  Service: Open Heart Surgery;  Laterality: Left; . STERNAL CLOSURE N/A 04/14/2017  Procedure: STERNAL CLOSURE;  Surgeon: Delight Ovens, MD;  Location: [facility] OR;  Service: Thoracic;  Laterality: N/A; . TEE WITHOUT CARDIOVERSION N/A 04/14/2017  Procedure: TRANSESOPHAGEAL ECHOCARDIOGRAM (TEE);  Surgeon: Delight Ovens, MD;  Location: [facility] OR;  Service: Thoracic;  Laterality: N/A; . ULTRASOUND GUIDANCE FOR VASCULAR ACCESS  04/11/2017  Procedure: Ultrasound Guidance For Vascular Access;  Surgeon: Corky Crafts, MD;  Location: [facility] INVASIVE CV LAB;  Service: Cardiovascular;; HPI: [name] is 66 year old with h/o MI, HTN, COPD, CAD admitted with chest pian found to have inferior/lateral STEMI. Intubated 11/14-11/26. Underwent angioplasty to mid lad with 25% residual stenosis. CXR stable lung volumes. Left greater than right lower lobe collapse or consolidation suspected. Stable to mildly increased pulmonary vascularity without overt edema.  Pt started on a Dys 1, pudding thick liquids diet per MBS on 11/28.  Repeat MBS ordered to determine readiness to advance.   Assessment / Plan / Recommendation CHL IP CLINICAL IMPRESSIONS 05/04/2017 Clinical Impression Pt continues to presents with functional deficits s/p prolonged intubation characterized by decreased laryngeal closure  with penetration across all consistencies  except purees.  Penetration with honey thick liquids is minimal, remains above the level of the cords and clears with minimal cues for use of a volitional throat clear/cough and a second swallow. Penetration with nectar thick liquids is deeper, in greater quantity and does not completely clear from the airway.  Thin liquids are eventually aspirated without sensation.  Given that pt is very cautious with his PO intake and can return demonstration of swallowing precautions with minimal assist, recommend that pt's diet be advanced to dys 2 textures, honey thick liquids.  Given overt cognitive impairment, full supervision during meals is indicated at this time.  Prognosis for further advancement is good with ongoing ST interventions for management of safe diet progression and continued increase in mobility with CIR level therapies.    SLP Visit Diagnosis Dysphagia, pharyngeal phase (R13.13) Attention and concentration deficit following -- Frontal lobe and executive function deficit following -- Impact on safety and function Moderate aspiration risk;Mild aspiration risk     Prognosis 05/04/2017 Prognosis for Safe Diet Advancement Good Barriers to Reach Goals Cognitive deficits Barriers/Prognosis Comment -- CHL IP DIET RECOMMENDATION 05/04/2017 SLP Diet Recommendations Dysphagia 2 (Fine chop) solids;Honey thick liquids Liquid Administration via Spoon Medication Administration Crushed with puree Compensations Slow rate;Minimize environmental distractions;Small sips/bites;Hard cough after swallow;Multiple dry swallows after each bite/sip Postural Changes Seated upright at 90 degrees            CHL IP ORAL PHASE 05/04/2017 Oral Phase Impaired Oral - Pudding Teaspoon -- Oral - Pudding Cup -- Oral - Honey Teaspoon WFL Oral - Honey Cup Piecemeal swallowing Oral - Nectar Teaspoon -- Oral - Nectar Cup Premature spillage;Piecemeal swallowing Oral - Nectar Straw -- Oral - Thin Teaspoon -- Oral - Thin Cup Piecemeal swallowing;Premature  spillage Oral - Thin Straw -- Oral - Puree WFL Oral - Mech Soft Impaired mastication Oral - Regular -- Oral - Multi-Consistency -- Oral - Pill -- Oral Phase - Comment --  CHL IP PHARYNGEAL PHASE 05/04/2017 Pharyngeal Phase Impaired Pharyngeal- Pudding Teaspoon -- Pharyngeal -- Pharyngeal- Pudding Cup -- Pharyngeal -- Pharyngeal- Honey Teaspoon Penetration/Aspiration during swallow;Reduced airway/laryngeal closure Pharyngeal Material enters airway, remains ABOVE vocal cords and not ejected out Pharyngeal- Honey Cup Penetration/Aspiration during swallow;Reduced airway/laryngeal closure Pharyngeal Material enters airway, remains ABOVE vocal cords and not ejected out Pharyngeal- Nectar Teaspoon -- Pharyngeal -- Pharyngeal- Nectar Cup Reduced airway/laryngeal closure;Penetration/Aspiration during swallow Pharyngeal Material enters airway, CONTACTS cords and not ejected out Pharyngeal- Nectar Straw -- Pharyngeal -- Pharyngeal- Thin Teaspoon -- Pharyngeal -- Pharyngeal- Thin Cup Reduced airway/laryngeal closure;Penetration/Aspiration during swallow Pharyngeal Material enters airway, passes BELOW cords without attempt by patient to eject out (silent aspiration) Pharyngeal- Thin Straw -- Pharyngeal -- Pharyngeal- Puree Reduced airway/laryngeal closure Pharyngeal -- Pharyngeal- Mechanical Soft Reduced airway/laryngeal closure Pharyngeal -- Pharyngeal- Regular -- Pharyngeal -- Pharyngeal- Multi-consistency -- Pharyngeal -- Pharyngeal- Pill -- Pharyngeal -- Pharyngeal Comment --  CHL IP CERVICAL ESOPHAGEAL PHASE 05/04/2017 Cervical Esophageal Phase WFL Pudding Teaspoon -- Pudding Cup -- Honey Teaspoon -- Honey Cup -- Nectar Teaspoon -- Nectar Cup -- Nectar Straw -- Thin Teaspoon -- Thin Cup -- Thin Straw -- Puree -- Mechanical Soft -- Regular -- Multi-consistency -- Pill -- Cervical Esophageal Comment -- No flowsheet data found. Page, Melanee Spry 05/04/2017, 7:44 PM              Recent Labs    05/05/17 0429  WBC 11.5*  HGB 9.3*   HCT 31.0*  PLT 424*  Recent Labs    05/05/17 0429  NA 141  K 3.8  CL 102  GLUCOSE 126*  BUN 18  CREATININE 0.76  CALCIUM 8.3*   CBG (last 3)  Recent Labs    05/05/17 1648 05/05/17 2138 05/06/17 0644  GLUCAP 80 89 90    Wt Readings from Last 3 Encounters:  05/06/17 90.7 kg (199 lb 15.3 oz)  04/27/17 88.3 kg (194 lb 10.7 oz)    Physical Exam:  Constitutional: He appearswell-developedand well-nourished.No distress. HENT: Normocephalicand atraumatic.  Eyes:EOMare normal. No discharge.  Cardiovascular: Normal rate. +murmur. No JVD Respiratory:Lungs clear. Unlabored.  BJ:YNWGNGI:Bowel sounds are normal. He exhibitsno distension.  +NG Musculoskeletal: He exhibits no edema, no tenderness. Neurological: He isalert. He can follow simple commands.   Motor: B/l UE 4-/5 proximal to distal  B/l LE: HF 2/5, KE 4/5, ADF/PF 4/5 (unchanged) Skin iswarmand dry. He isnot diaphoretic.Sternal incision is intact.   Psychiatric: Pleasant. Tangential  Assessment/Plan: 1.  Functional deficits and decreased mobility secondary to debility after multiple medical complications which require 3+ hours per day of interdisciplinary therapy in a comprehensive inpatient rehab setting. Physiatrist is providing close team supervision and 24 hour management of active medical problems listed below. Physiatrist and rehab team continue to assess barriers to discharge/monitor patient progress toward functional and medical goals.  Function:  Bathing Bathing position   Position: Sitting EOB  Bathing parts Body parts bathed by patient: Right upper leg, Front perineal area, Left upper leg(LB only this am) Body parts bathed by helper: Right lower leg, Left lower leg, Buttocks  Bathing assist        Upper Body Dressing/Undressing Upper body dressing   What is the patient wearing?: Pull over shirt/dress     Pull over shirt/dress - Perfomed by patient: Thread/unthread right sleeve,  Thread/unthread left sleeve, Pull shirt over trunk Pull over shirt/dress - Perfomed by helper: Put head through opening        Upper body assist Assist Level: Supervision or verbal cues      Lower Body Dressing/Undressing Lower body dressing   What is the patient wearing?: Pants, Shoes     Pants- Performed by patient: Thread/unthread right pants leg, Thread/unthread left pants leg, Pull pants up/down Pants- Performed by helper: Thread/unthread right pants leg, Thread/unthread left pants leg   Non-skid slipper socks- Performed by helper: Don/doff right sock, Don/doff left sock   Socks - Performed by helper: Don/doff right sock, Don/doff left sock   Shoes - Performed by helper: Don/doff right shoe, Don/doff left shoe          Lower body assist Assist for lower body dressing: Touching or steadying assistance (Pt > 75%)(with use of reacher and long shoe horn)      Toileting Toileting   Toileting steps completed by patient: Adjust clothing prior to toileting, Performs perineal hygiene, Adjust clothing after toileting Toileting steps completed by helper: Adjust clothing prior to toileting, Performs perineal hygiene, Adjust clothing after toileting Toileting Assistive Devices: Grab bar or rail  Toileting assist Assist level: Set up/obtain supplies   Transfers Chair/bed transfer   Chair/bed transfer method: Stand pivot Chair/bed transfer assist level: 2 helpers(orthostatic; for safety) Chair/bed transfer assistive device: Patent attorneyWalker     Locomotion Ambulation     Max distance: 8 Assist level: Touching or steadying assistance (Pt > 75%)   Wheelchair   Type: Manual Max wheelchair distance: 50' Assist Level: Touching or steadying assistance (Pt > 75%)  Cognition Comprehension Comprehension assist level: Understands basic 75 -  89% of the time/ requires cueing 10 - 24% of the time  Expression Expression assist level: Expresses basic 75 - 89% of the time/requires cueing 10 - 24% of  the time. Needs helper to occlude trach/needs to repeat words.  Social Interaction Social Interaction assist level: Interacts appropriately 75 - 89% of the time - Needs redirection for appropriate language or to initiate interaction.  Problem Solving Problem solving assist level: Solves basic 75 - 89% of the time/requires cueing 10 - 24% of the time  Memory Memory assist level: Recognizes or recalls 75 - 89% of the time/requires cueing 10 - 24% of the time   Medical Problem List and Plan: 1.   Debility secondary to coronary artery disease and left ventricular rupture with acute systolic heart failure  Cont CIR 2. DVT Prophylaxis/Anticoagulation: Pharmaceutical:Lovenox 3. Pain Management: Use acetaminophen as needed 4. Mood:[name] to follow for evaluation and support.   Requires significant amount of motivation 5. Neuropsych: This patientis not fullycapable of making decisions on hisown behalf. 6. Skin/Wound Care:Monitor surgical wounds daily for healing . 7. Fluids/Electrolytes/Nutrition:Monitor I/O. Added water flushes qid. .   Advanced to D2 honey on 12/6  Will DC nightly tube feeds, but continue NG for fluids. Encourage fluids     Magnesium within normal limits on 12/6 8. ESBL E coli (penile meatus): Completed meropenem and Vancomycin  9. Hypokalemia: Being Managed with runs of K+. Added supplement via NG.  K+ 3.8 on 12/7  Con to monitor 10.Anoxic BI: Continue Seroquel and wean over next few days.   Establish better sleep wake cycle 11. Leukocytosis: Continue to monitor for trends   Afebrile   WBCs 11.5 on 12/7, improving  Cont to monitor 12. YNW:GNFAOZHYQMPAF:Amiodarone 200 mg bid and coreg addedon11/29 13. COPD with chronic hypoxic/hypercarbic failure:  Wean supplemental O2 as able. Encouraging IS 14. Acute systolic heart failure due to cardiogenic shock: EF improved to 40%. No lasix. Monitor weight for signs of overload. Check weights daily.  Filed Weights   05/03/17 0450  05/05/17 0427 05/06/17 0459  Weight: 91 kg (200 lb 9.9 oz) 90.2 kg (198 lb 13.7 oz) 90.7 kg (199 lb 15.3 oz)   15. ABLA: Monitor H/H serially.    Hb  9.3 on 12/7   Cont to monitor 16. Hyperglycemia- resolved off TF, d/c CBG      CBG (last 3)  Recent Labs    05/05/17 1648 05/05/17 2138 05/06/17 0644  GLUCAP 80 89 90    Cont to monitor 17. Essential HTN Vitals:   05/05/17 2119 05/06/17 0459  BP: 139/66 (!) 136/58  Pulse: 70 73  Resp: 20 18  Temp:  (!) 97.3 F (36.3 C)  SpO2: 99% 98%    controlled on 12/8   LOS (Days) 9 A FACE TO FACE EVALUATION WAS PERFORMED  Erick ColaceAndrew E Kirsteins, MD 05/06/2017 9:15 AM

## 2017-05-06 NOTE — Progress Notes (Signed)
Occupational Therapy Session Note  Patient Details  Name: Ronald HoveGary [name] MRN: [id number] Date of Birth: 12-Apr-1951  Today's Date: 05/06/2017 OT Individual Time: [id number] OT Individual Time Calculation (min): 60 min    Short Term Goals: Week 2:  OT Short Term Goal 1 (Week 2): STGs = LTGs  Skilled Therapeutic Interventions/Progress Updates:    Tx focus on precaution adherence, standing tolerance, cognitive awareness, and balance during self care tasks.   Pt greeted supine in bed, agreeable to complete bathing/dressing EOB. Pt transitioned to EOB with Mod A with cues for pushing with unilateral UE support. Sit<stand for pericare with Min A and cues for using 1 UE for power up with RW. Pt able to achieve seated figure 4 for washing feet, but reported hip tightness and requested for OT to assist with thoroughness. Max A for footwear, pt resistant to education on adaptive techniques to increase his independence. Pt then ambulated short distance to sink with Min A and RW, completed half of oral care in standing before he adamantly stated he needed to sit. Pt reported significant fatigue at end of tx, requested to return to bed. Unreceptive to education on benefits of staying OOB. Pt returned to supine with min cues for sternal precaution adherence. He was repositioned for comfort and left with all needs within reach and bed alarm activated.    Therapy Documentation Precautions:  Precautions Precautions: Sternal, Fall Restrictions Weight Bearing Restrictions: No(sternal precautions) Other Position/Activity Restrictions: Sternal precautions Vital Signs: Therapy Vitals Pulse Rate: 67 BP: (!) 146/64 Pain: No c/o pain during tx    ADL: ADL ADL Comments: Refer to functional navigator     See Function Navigator for Current Functional Status.   Therapy/Group: Individual Therapy  Rose Hippler A [name] 05/06/2017, 12:25 PM

## 2017-05-06 NOTE — Progress Notes (Signed)
Physical Therapy Session Note  Patient Details  Name: Ronald HoveGary Humbarger MRN: [id number] Date of Birth: [birth year]  Today's Date: 05/06/2017 PT Missed Time: 60 Minutes Missed Time Reason: Patient unwilling to participate;Patient fatigue  Pt asleep upon arrival and required maximal stimulation to arouse. Declining PT activity this session 2/2 fatigue, unable to keep eyes open during conversation. Continued to offer assistance to sit up in recliner or perform bed level therapy, pt continued to decline.   Cayman Brogden K Arnette 05/06/2017, 7:31 PM

## 2017-05-06 NOTE — Progress Notes (Signed)
Speech Language Pathology Daily Session Note  Patient Details  Name: [name] MRN: [id number] Date of Birth: [birth year]  Today's Date: 05/06/2017 SLP Individual Time: 0700-0730 SLP Individual Time Calculation (min): 30 min  Short Term Goals: Week 2: SLP Short Term Goal 1 (Week 2): Pt will consume therapeutic trials of ice chips with minimal overt s/s of aspiration over 3 consecutive sessions prior to repeat objective assessment.   SLP Short Term Goal 2 (Week 2): Pt will sustain his attention to functional tasks for 10 minutes with min verbal cues for redirection.   SLP Short Term Goal 3 (Week 2): Pt will maintain a topic of conversation for at least 5 turns with supervision verbal cues.    SLP Short Term Goal 4 (Week 2): Pt will complete basic, functional tasks with supervision verbal cues for problem solving.   SLP Short Term Goal 5 (Week 2): Pt will consume dys 2 textures and honey thick liquids with supervision cues for use of swallowing precautions.   SLP Short Term Goal 6 (Week 2): Pt will return demonstration of at least 2 safety precautions with min assist verbal cues.    Skilled Therapeutic Interventions: Skilled treatment session focused on dysphagia goals. Patient recalled swallowing strategies with Mod I and consumed honey-thick liquids via tsp (per MBS report) without overt s/s of aspiration with Mod I for use of swallowing strategies. Breakfast tray was not available and patient declined trials of solids. Patient left upright in bed with alarm on and all needs within reach. Continue with current plan of care.      Function:  Eating Eating   Modified Consistency Diet: Yes Eating Assist Level: Swallowing techniques: self managed   Eating Set Up Assist For: Opening containers       Cognition Comprehension Comprehension assist level: Understands basic 75 - 89% of the time/ requires cueing 10 - 24% of the time  Expression   Expression assist level: Expresses basic 75 -  89% of the time/requires cueing 10 - 24% of the time. Needs helper to occlude trach/needs to repeat words.  Social Interaction Social Interaction assist level: Interacts appropriately 75 - 89% of the time - Needs redirection for appropriate language or to initiate interaction.  Problem Solving Problem solving assist level: Solves basic 75 - 89% of the time/requires cueing 10 - 24% of the time  Memory Memory assist level: Recognizes or recalls 75 - 89% of the time/requires cueing 10 - 24% of the time    Pain No/Denies Pain  Therapy/Group: Individual Therapy  [name] 05/06/2017, 7:28 AM

## 2017-05-07 ENCOUNTER — Inpatient Hospital Stay (HOSPITAL_COMMUNITY): Payer: Medicare Other | Admitting: Occupational Therapy

## 2017-05-07 ENCOUNTER — Inpatient Hospital Stay (HOSPITAL_COMMUNITY): Payer: Medicare Other

## 2017-05-07 MED ORDER — LOSARTAN POTASSIUM 50 MG PO TABS
25.0000 mg | ORAL_TABLET | Freq: Two times a day (BID) | ORAL | Status: DC
  Administered 2017-05-07: 25 mg
  Filled 2017-05-07 (×2): qty 1

## 2017-05-07 MED ORDER — QUETIAPINE FUMARATE 25 MG PO TABS
25.0000 mg | ORAL_TABLET | Freq: Every day | ORAL | Status: AC
  Administered 2017-05-07 – 2017-05-08 (×2): 25 mg
  Filled 2017-05-07 (×2): qty 1

## 2017-05-07 MED ORDER — CARVEDILOL 3.125 MG PO TABS
3.1250 mg | ORAL_TABLET | Freq: Two times a day (BID) | ORAL | Status: DC
  Administered 2017-05-08 – 2017-05-11 (×7): 3.125 mg
  Filled 2017-05-07 (×9): qty 1

## 2017-05-07 MED ORDER — SENNOSIDES 8.8 MG/5ML PO SYRP
10.0000 mL | ORAL_SOLUTION | Freq: Every day | ORAL | Status: DC
  Administered 2017-05-07 – 2017-05-08 (×2): 10 mL
  Filled 2017-05-07 (×5): qty 10

## 2017-05-07 MED ORDER — SODIUM CHLORIDE 0.45 % IV SOLN
INTRAVENOUS | Status: DC
  Administered 2017-05-07 – 2017-05-10 (×4): via INTRAVENOUS

## 2017-05-07 NOTE — Progress Notes (Addendum)
[place] PHYSICAL MEDICINE & REHABILITATION     PROGRESS NOTE    Subjective/Complaints:  Per RN , nares getting irritated from cortrak Po intake 50-90% meals  ROS: Denies CP, SOB, N/V/D.  Objective: Vital Signs: Blood pressure (!) 124/53, pulse 82, temperature 98 F (36.7 C), temperature source Oral, resp. rate 18, height 5\' 7"  (1.702 m), weight 88 kg (194 lb 0.1 oz), SpO2 96 %. No results found. Recent Labs    05/05/17 0429  WBC 11.5*  HGB 9.3*  HCT 31.0*  PLT 424*   Recent Labs    05/05/17 0429  NA 141  K 3.8  CL 102  GLUCOSE 126*  BUN 18  CREATININE 0.76  CALCIUM 8.3*   CBG (last 3)  Recent Labs    05/06/17 1200 05/06/17 1659 05/06/17 2001  GLUCAP 99 87 111*    Wt Readings from Last 3 Encounters:  05/07/17 88 kg (194 lb 0.1 oz)  04/27/17 88.3 kg (194 lb 10.7 oz)    Physical Exam:  Constitutional: He appearswell-developedand well-nourished.No distress. HENT: Normocephalicand atraumatic.  Eyes:EOMare normal. No discharge.  Cardiovascular: Normal rate. +murmur. No JVD Respiratory:Lungs clear. Unlabored.  HY:QMVHQGI:Bowel sounds are normal. He exhibitsno distension.  +NG Musculoskeletal: He exhibits no edema, no tenderness. Neurological: He isalert. He can follow simple commands.   Motor: B/l UE 4-/5 proximal to distal  B/l LE: HF 2/5, KE 4/5, ADF/PF 4/5 (unchanged) Skin iswarmand dry. He isnot diaphoretic.Sternal incision is intact.   Psychiatric: Pleasant. Tangential  Assessment/Plan: 1.  Functional deficits and decreased mobility secondary to debility after multiple medical complications which require 3+ hours per day of interdisciplinary therapy in a comprehensive inpatient rehab setting. Physiatrist is providing close team supervision and 24 hour management of active medical problems listed below. Physiatrist and rehab team continue to assess barriers to discharge/monitor patient progress toward functional and medical  goals.  Function:  Bathing Bathing position   Position: Sitting EOB  Bathing parts Body parts bathed by patient: Right upper leg, Front perineal area, Left upper leg, Right arm, Left arm, Chest, Abdomen Body parts bathed by helper: Right lower leg, Left lower leg, Buttocks, Back  Bathing assist        Upper Body Dressing/Undressing Upper body dressing   What is the patient wearing?: Pull over shirt/dress     Pull over shirt/dress - Perfomed by patient: Thread/unthread right sleeve, Thread/unthread left sleeve, Pull shirt over trunk, Put head through opening Pull over shirt/dress - Perfomed by helper: Put head through opening        Upper body assist Assist Level: Supervision or verbal cues      Lower Body Dressing/Undressing Lower body dressing   What is the patient wearing?: Pants, Non-skid slipper socks     Pants- Performed by patient: Thread/unthread left pants leg, Pull pants up/down Pants- Performed by helper: Thread/unthread right pants leg   Non-skid slipper socks- Performed by helper: Don/doff right sock, Don/doff left sock   Socks - Performed by helper: Don/doff right sock, Don/doff left sock   Shoes - Performed by helper: Don/doff right shoe, Don/doff left shoe          Lower body assist Assist for lower body dressing: Touching or steadying assistance (Pt > 75%)(with use of reacher and long shoe horn)      Toileting Toileting Toileting activity did not occur: Safety/medical concerns Toileting steps completed by patient: Adjust clothing prior to toileting, Performs perineal hygiene, Adjust clothing after toileting Toileting steps completed by helper: Adjust  clothing after toileting Toileting Assistive Devices: Grab bar or rail  Toileting assist Assist level: Set up/obtain supplies   Transfers Chair/bed transfer   Chair/bed transfer method: Stand pivot Chair/bed transfer assist level: 2 helpers(orthostatic; for safety) Chair/bed transfer assistive  device: Patent attorneyWalker     Locomotion Ambulation     Max distance: 8 Assist level: Touching or steadying assistance (Pt > 75%)   Wheelchair   Type: Manual Max wheelchair distance: 50' Assist Level: Touching or steadying assistance (Pt > 75%)  Cognition Comprehension Comprehension assist level: Understands basic 75 - 89% of the time/ requires cueing 10 - 24% of the time  Expression Expression assist level: Expresses basic 75 - 89% of the time/requires cueing 10 - 24% of the time. Needs helper to occlude trach/needs to repeat words.  Social Interaction Social Interaction assist level: Interacts appropriately 75 - 89% of the time - Needs redirection for appropriate language or to initiate interaction.  Problem Solving Problem solving assist level: Solves basic 75 - 89% of the time/requires cueing 10 - 24% of the time  Memory Memory assist level: Recognizes or recalls 75 - 89% of the time/requires cueing 10 - 24% of the time   Medical Problem List and Plan: 1.   Debility secondary to coronary artery disease and left ventricular rupture with acute systolic heart failure  Cont CIR 2. DVT Prophylaxis/Anticoagulation: Pharmaceutical:Lovenox 3. Pain Management: Use acetaminophen as needed 4. Mood:[name] to follow for evaluation and support.   Requires significant amount of motivation 5. Neuropsych: This patientis not fullycapable of making decisions on hisown behalf. 6. Skin/Wound Care:Monitor surgical wounds daily for healing . 7. Fluids/Electrolytes/Nutrition:Monitor I/O. Added water flushes qid. .   Advanced to D2 honey on 12/6  Last 2 meals ate 50 and 90%, fluids minmal , will d/c cortrak and run IVF at noc Magnesium within normal limits on 12/6 8. ESBL E coli (penile meatus): Completed meropenem and Vancomycin  9. Hypokalemia: Being Managed with runs of K+. Added supplement via NG.  K+ 3.8 on 12/7  Con to monitor 10.Anoxic BI: Continue Seroquel and wean over next few days.    Establish better sleep wake cycle 11. Leukocytosis: Continue to monitor for trends   Afebrile   WBCs 11.5 on 12/7, improving  Cont to monitor 12. PIR:[id number]:Amiodarone 200 mg bid and coreg addedon11/29 13. COPD with chronic hypoxic/hypercarbic failure:  Wean supplemental O2 as able. Encouraging IS 14. Acute systolic heart failure due to cardiogenic shock: EF improved to 40%. No lasix. Monitor weight for signs of overload. Check weights daily.  Filed Weights   05/05/17 0427 05/06/17 0459 05/07/17 0511  Weight: 90.2 kg (198 lb 13.7 oz) 90.7 kg (199 lb 15.3 oz) 88 kg (194 lb 0.1 oz)   15. ABLA: Monitor H/H serially.    Hb  9.3 on 12/7   Cont to monitor 16. Hyperglycemia- resolved off TF, d/c CBG      CBG (last 3)  Recent Labs    05/06/17 1200 05/06/17 1659 05/06/17 2001  GLUCAP 99 87 111*    Cont to monitor 17. Essential HTN Vitals:   05/07/17 0511 05/07/17 0942  BP: (!) 144/64 (!) 124/53  Pulse: 77 82  Resp: 20 18  Temp: 98.6 F (37 C) 98 F (36.7 C)  SpO2: 96% 96%    controlled on 12/9   LOS (Days) 10 A FACE TO FACE EVALUATION WAS PERFORMED  Erick ColaceAndrew E Kirsteins, MD 05/07/2017 10:49 AM

## 2017-05-07 NOTE — Progress Notes (Signed)
Occupational Therapy Session Note  Patient Details  Name: Ronald HoveGary Baar MRN: [id number] Date of Birth: 02-10-1951  Today's Date: 05/07/2017 OT Individual Time: [id number] and 1455-1540 OT Individual Time Calculation (min): 61 min and 45 min  Short Term Goals: Week 2:  OT Short Term Goal 1 (Week 2): STGs = LTGs  Skilled Therapeutic Interventions/Progress Updates:    Tx focus on ADL retraining, activity tolerance, adherence to precautions, and functional transfers.   Pt greeted supine in bed, required encouragement to participate in tx due to increased fatigue/lethargy. He transitioned to EOB with Mod A and unilateral UE support with min vcs. Pt refusing shower, and therefore bathing/dressing completed EOB at sit<stand level. Throughout ADL, pt resistant to increasing functional independence when provided opportunities and education on adaptive techniques. He required Mod A for LB self care, Min A for sit<stand with max cues for hand placement. Pt also completing toileting during session, refused to ambulate into bathroom but compromised with BSC instead of using urinal. Stand pivot<BSC with Min A and RW. At end of tx pt was agreeable to sit up in w/c to eat breakfast with staff assist. He transferred to w/c and was left with all needs and safety belt applied.   Pt was agreeable to remove supplemental 02 today, with 02 sats ranging from 92-96% on RA during activity. Notified RN and she was agreeable to keep 02 removed outside of tx.   2nd Session 1:1 tx (45 min) Tx focus on functional transfers, activity tolerance, and balance during self care tasks.   Pt greeted supine in bed with RN present, administering suppository. Pt agreeable to complete shaving w/c level at sink until he felt an urge to use the bathroom. Pt ambulating short distance to w/c placed at sink with Min A and RW. He required Mod A for shaving due to fatigue/self limiting behaviors. Pt then reported need to void. He ambulated into  bathroom with RW and Min A to toilet. Pt voiding B+B. Max A for toileting tasks and for changing LB garments (pt has urinated on them while sitting). He then ambulated back to bed and returned to supine. He reported feeling significant fatigue. 02 sats on RA in mid 80s and did not increase with extra time and PLB, therefore placed him back on supplemental 02 via nasal cannula. Pt repositioned for comfort. Was left with all needs within reach, bed alarm activated, and 02 sats 94%.   Therapy Documentation Precautions:  Precautions Precautions: Sternal, Fall Restrictions Weight Bearing Restrictions: No(sternal precautions) Other Position/Activity Restrictions: Sternal precautions Vital Signs: Therapy Vitals Temp: 98 F (36.7 C) Temp Source: Oral Pulse Rate: 82 Resp: 18 BP: (!) 124/53 Patient Position (if appropriate): Lying Oxygen Therapy SpO2: 96 % O2 Device: Nasal Cannula Pain: No c/o pain during tx    ADL: ADL ADL Comments: Refer to functional navigator     See Function Navigator for Current Functional Status.   Therapy/Group: Individual Therapy  Devynn Scheff A Lonny Eisen 05/07/2017, 12:11 PM

## 2017-05-07 NOTE — Progress Notes (Signed)
Notified on call Dr. Wynn BankerKirsteins of b/p reading 103/46 ( HR- 79 )and prior b/p reading today per schedule medications, no new orders continue orders

## 2017-05-07 NOTE — Progress Notes (Signed)
Physical Therapy Session Note  Patient Details  Name: Ronald HoveGary [name] MRN: [id number] Date of Birth: 07/08/1950  Today's Date: 05/07/2017 PT Individual Time: 1300-1400 PT Individual Time Calculation (min): 60 min   Short Term Goals: Week 2:  PT Short Term Goal 1 (Week 2): =LTG  Skilled Therapeutic Interventions/Progress Updates:    Pt supine in bed upon PT arrival, requiring max encouragement to participate in therapy and get OOB this session. Agreeable and denies pain. Pt transferred supine>sitting EOB with mod assist to bring trunk forward. Pt performed sit<>stand with min assist and RW, and stand pivot transfer with min assist and RW. Pt asymptomatic for orthostatic hypotension, vitals monitored with BP 99/56. Pt transported to dayroom in w/c total assist, BP re-checked: 112/62. Pt states he does not feel well, requesting to perform chair exercises. Pt performed 2 x 10 LAQ, x 20 marches, 2 x 10 hip abduction, and x 10 glute sets for LE strengthening. Pt transported back to room and left seated in w/c with needs in reach, QRB in place.   Therapy Documentation Precautions:  Precautions Precautions: Sternal, Fall Restrictions Weight Bearing Restrictions: No(sternal precautions) Other Position/Activity Restrictions: Sternal precautions   See Function Navigator for Current Functional Status.   Therapy/Group: Individual Therapy  Cresenciano GenreEmily van Schagen, PT, DPT 05/07/2017, 1:57 PM

## 2017-05-08 ENCOUNTER — Inpatient Hospital Stay (HOSPITAL_COMMUNITY): Payer: Medicare Other | Admitting: Occupational Therapy

## 2017-05-08 ENCOUNTER — Inpatient Hospital Stay (HOSPITAL_COMMUNITY): Payer: Medicare Other

## 2017-05-08 ENCOUNTER — Inpatient Hospital Stay (HOSPITAL_COMMUNITY): Payer: Medicare Other | Admitting: Physical Therapy

## 2017-05-08 DIAGNOSIS — I509 Heart failure, unspecified: Secondary | ICD-10-CM

## 2017-05-08 DIAGNOSIS — I952 Hypotension due to drugs: Secondary | ICD-10-CM | POA: Insufficient documentation

## 2017-05-08 LAB — ECHOCARDIOGRAM COMPLETE
E decel time: 222 ms
FS: 26 % — AB (ref 28–44)
Height: 67 in
IVS/LV PW RATIO, ED: 0.92
LA ID, A-P, ES: 40 mm
LA diam end sys: 40 mm
LA diam index: 1.96 cm/m2
LV PW d: 12 mm — AB (ref 0.6–1.1)
LV e' LATERAL: 6.53 cm/s
LVOT area: 3.14 cm2
LVOT diameter: 20 mm
Lateral S' vel: 10.8 cm/s
MV Dec: 222
MV pk E vel: 1.2 m/s
TAPSE: 14.2 mm
TDI e' lateral: 6.53
TDI e' medial: 7.07
Weight: 3047.64 [oz_av]

## 2017-05-08 MED ORDER — LOSARTAN POTASSIUM 50 MG PO TABS
25.0000 mg | ORAL_TABLET | Freq: Every day | ORAL | Status: DC
  Administered 2017-05-09: 25 mg
  Filled 2017-05-08 (×2): qty 1

## 2017-05-08 MED ORDER — LOSARTAN POTASSIUM 25 MG PO TABS: 12.5000 mg | ORAL_TABLET | Freq: Two times a day (BID) | ORAL | Status: DC

## 2017-05-08 MED ORDER — PERFLUTREN LIPID MICROSPHERE
1.0000 mL | INTRAVENOUS | Status: AC | PRN
  Administered 2017-05-08: 3 mL via INTRAVENOUS
  Filled 2017-05-08: qty 10

## 2017-05-08 NOTE — Progress Notes (Signed)
Advanced Heart Failure Rounding Note   Subjective:    Multiple complaints. Says he can't see, BP is too low, food is bad and he doesn't feel great.   He is oriented to person, place and year. Denies SOB or CP.  SBP seems to be consistently 100-120 with 2 readings recorded < 100.   Able to tell me how many fingers I have held up and read his heart pillow but says he can't read chart at the other side for the room.   Objective:   Weight Range:  Vital Signs:   Temp:  [97.8 F (36.6 C)-99 F (37.2 C)] 99 F (37.2 C) (12/10 0545) Pulse Rate:  [64-79] 73 (12/10 0956) Resp:  [18-20] 20 (12/10 0545) BP: (84-126)/(46-76) 117/48 (12/10 0956) SpO2:  [95 %-97 %] 97 % (12/10 0956) Weight:  [86.4 kg (190 lb 7.6 oz)] 86.4 kg (190 lb 7.6 oz) (12/10 0601) Last BM Date: 05/07/17  Weight change: Filed Weights   05/06/17 0459 05/07/17 0511 05/08/17 0601  Weight: 90.7 kg (199 lb 15.3 oz) 88 kg (194 lb 0.1 oz) 86.4 kg (190 lb 7.6 oz)    Intake/Output:   Intake/Output Summary (Last 24 hours) at 05/08/2017 1124 Last data filed at 05/08/2017 0830 Gross per 24 hour  Intake 450 ml  Output 800 ml  Net -350 ml     Physical Exam: General:  Sitting in chair. No resp difficulty HEENT: normal Neck: supple. JVP flat . Carotids 2+ bilat; no bruits. No lymphadenopathy or thryomegaly appreciated. Cor: PMI nondisplaced. Sternal wound well healed  Regular rate & rhythm. No rubs, gallops or murmurs. Lungs: clear decreased BS throughout Abdomen: soft, nontender, nondistended. No hepatosplenomegaly. No bruits or masses. Good bowel sounds. Extremities: no cyanosis, clubbing, rash, edema Neuro: alert & orientedx3, cranial nerves grossly intact. moves all 4 extremities w/o difficulty. Affect pleasant   Labs: Basic Metabolic Panel: Recent Labs  Lab 05/02/17 0404 05/03/17 0817 05/04/17 0702 05/05/17 0429  NA  --   --   --  141  K  --   --   --  3.8  CL  --   --   --  102  CO2  --   --   --   35*  GLUCOSE  --   --   --  126*  BUN  --   --   --  18  CREATININE  --   --   --  0.76  CALCIUM  --   --   --  8.3*  MG 2.4 2.3 2.3  --     Liver Function Tests: No results for input(s): AST, ALT, ALKPHOS, BILITOT, PROT, ALBUMIN in the last 168 hours. No results for input(s): LIPASE, AMYLASE in the last 168 hours. No results for input(s): AMMONIA in the last 168 hours.  CBC: Recent Labs  Lab 05/05/17 0429  WBC 11.5*  NEUTROABS 9.6*  HGB 9.3*  HCT 31.0*  MCV 91.7  PLT 424*    Cardiac Enzymes: No results for input(s): CKTOTAL, CKMB, CKMBINDEX, TROPONINI in the last 168 hours.  BNP: BNP (last 3 results) No results for input(s): BNP in the last 8760 hours.  ProBNP (last 3 results) No results for input(s): PROBNP in the last 8760 hours.    Other results:  Imaging:  No results found.   Medications:     Scheduled Medications: . amiodarone  200 mg Per Tube BID  . aspirin  81 mg Per Tube Daily  . atorvastatin  80 mg Per Tube q1800  . carvedilol  3.125 mg Per Tube BID WC  . Chlorhexidine Gluconate Cloth  6 each Topical Daily  . enoxaparin (LOVENOX) injection  40 mg Subcutaneous Q24H  . feeding supplement (PRO-STAT SUGAR FREE 64)  30 mL Per Tube BID  . free water  200 mL Per Tube Q6H  . losartan  12.5 mg Per Tube BID  . mouth rinse  15 mL Mouth Rinse BID  . pantoprazole sodium  40 mg Per Tube Daily  . QUEtiapine  25 mg Per Tube QHS  . sennosides  10 mL Per Tube QHS  . ticagrelor  90 mg Per Tube BID     Infusions: . sodium chloride 50 mL/hr at 05/08/17 0915     PRN Medications:  acetaminophen, albuterol, alum & mag hydroxide-simeth, bisacodyl, diphenhydrAMINE, Gerhardt's butt cream, guaiFENesin-dextromethorphan, prochlorperazine **OR** prochlorperazine **OR** prochlorperazine, RESOURCE THICKENUP CLEAR, sodium phosphate, traZODone   Assessment:   [name] is 66 year old with h/o MI, HTN, COPD admitted with chest pain. EKG with evidence of  inferior/lateral STEMI. Due to lethargy and hypoxemia he was intubated in the ED. 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 Transferred to CIR    Plan/Discussion:    1. CAD with OOH anterior MI c/b LV pseudoaneurysm and cardiogenic shock - attempted PCI of LAD failed due to chronic occlusion and extensive infarct - s/p PCI/DES to distal RCA Remains on ASA, brilinta, atorvastatin - No s/s of ischemia.    - S/P LV pseudoaneurysm repair 11/14  2. Chronic systolic HF-> cardiogenic shock resolved - EF improved 25%->40% - Volume status stable on exam. No need for diuretic at this point  - BP stable on current HF regimen will continue losartan, carvedilol. Will add spiro as tolerated.  - Repeat echo   3. COPD with chronic CO2 retention  - Stable. No wheeze.   4. PAF - remains in NSR. Cut amio to 200 daily  5. Cognitive deficits - improving slowly   Despite his complaints. Seems to be doing quite well. On good HF meds. Will repeat echo. Will follow at a distance.    Length of Stay: [id number] K[address]vert 05/08/2017, 11:24 AM  Advanced Heart Failure Team Pager [phone] (M-F; 7a - 4p)  Please contact [facility] for night-coverage after hours (4p -7a ) and weekends on [email or web address]

## 2017-05-08 NOTE — Progress Notes (Signed)
  Echocardiogram 2D Echocardiogram has been performed.  Dorena Dewiffany G Traeh Milroy 05/08/2017, 4:19 PM

## 2017-05-08 NOTE — Progress Notes (Signed)
Speech Language Pathology Daily Session Note  Patient Details  Name: Ronald HoveGary Rzepka MRN: [id number] Date of Birth: 1950-09-15  Today's Date: 05/08/2017 SLP Individual Time: 1400-1445 SLP Individual Time Calculation (min): 45 min  Short Term Goals: Week 2: SLP Short Term Goal 1 (Week 2): Pt will consume therapeutic trials of ice chips with minimal overt s/s of aspiration over 3 consecutive sessions prior to repeat objective assessment.   SLP Short Term Goal 2 (Week 2): Pt will sustain his attention to functional tasks for 10 minutes with min verbal cues for redirection.   SLP Short Term Goal 3 (Week 2): Pt will maintain a topic of conversation for at least 5 turns with supervision verbal cues.    SLP Short Term Goal 4 (Week 2): Pt will complete basic, functional tasks with supervision verbal cues for problem solving.   SLP Short Term Goal 5 (Week 2): Pt will consume dys 2 textures and honey thick liquids with supervision cues for use of swallowing precautions.   SLP Short Term Goal 6 (Week 2): Pt will return demonstration of at least 2 safety precautions with min assist verbal cues.    Skilled Therapeutic Interventions: Skilled ST services focused on swallow and cognitive skills. SLP facilitated oral care with suction toothbrush prior to trials of ice chips. Pt demonstrated ability to follow swallow strategies with supervision A verbal cues and no overt s/s aspiration. SLP educated pt swallow impairment, progression of swallow recovery and follow up MBS, pt agreed. Pt demonstrated ability to maintain topic for 5 exchanges with min-supervision verbal cues for redirection and sustained attention in 10 minute intervals with min A verbal cues. Ptr was left in room with call bell within reach. Recommend to continue skilled ST services.      Function:  Eating Eating   Modified Consistency Diet: No(ice chips) Eating Assist Level: Supervision or verbal cues;More than reasonable amount of time            Cognition Comprehension Comprehension assist level: Understands basic 75 - 89% of the time/ requires cueing 10 - 24% of the time  Expression   Expression assist level: Expresses basic 75 - 89% of the time/requires cueing 10 - 24% of the time. Needs helper to occlude trach/needs to repeat words.  Social Interaction Social Interaction assist level: Interacts appropriately 90% of the time - Needs monitoring or encouragement for participation or interaction.  Problem Solving Problem solving assist level: Solves basic 75 - 89% of the time/requires cueing 10 - 24% of the time  Memory Memory assist level: Recognizes or recalls 75 - 89% of the time/requires cueing 10 - 24% of the time    Pain Pain Assessment Pain Assessment: No/denies pain  Therapy/Group: Individual Therapy  Tavin Vernet  [facility] HospitalCRATCH 05/08/2017, 3:44 PM

## 2017-05-08 NOTE — Progress Notes (Signed)
Physical Therapy Note  Patient Details  Name: [name] MRN: [id number] Date of Birth: [birth year] Today's Date: 05/08/2017    Time: 1130-1150 20 minutes  1:1 No c/o pain.  Pt states he "doesn't want to get my heart beating again"  Pt agreeable to bed level therapy with max encouragement.  Pt performs heel slides, SAQ, SLR, hip abd/add, LTR all 3 x 5. Pt requires frequent rests due to fatigue.   Kennedy Bohanon 05/08/2017, 12:30 PM

## 2017-05-08 NOTE — Progress Notes (Signed)
PHYSICAL MEDICINE & REHABILITATION     PROGRESS NOTE   Subjective/Complaints: Pt seen laying in bed this AM.  He slept well overnight.  He is happy that he no longer has an NG tube.  He has questions about progress and medications.  Nursing with concerns about blood pressure.   ROS: Denies CP, SOB, N/V/D.  Objective: Vital Signs: Blood pressure (!) 110/48, pulse 77, temperature 99 F (37.2 C), temperature source Oral, resp. rate 20, height 5\' 7"  (1.702 m), weight 86.4 kg (190 lb 7.6 oz), SpO2 95 %. No results found. No results for input(s): WBC, HGB, HCT, PLT in the last 72 hours. No results for input(s): NA, K, CL, GLUCOSE, BUN, CREATININE, CALCIUM in the last 72 hours.  Invalid input(s): CO CBG (last 3)  Recent Labs    05/06/17 1200 05/06/17 1659 05/06/17 2001  GLUCAP 99 87 111*    Wt Readings from Last 3 Encounters:  05/08/17 86.4 kg (190 lb 7.6 oz)  04/27/17 88.3 kg (194 lb 10.7 oz)    Physical Exam:  Constitutional: He appearswell-developedand well-nourished.No distress. HENT: Normocephalicand atraumatic.  Eyes:EOMare normal. No discharge.  Cardiovascular: Normal rate. +murmur. No JVD Respiratory:Lungs clear. unlabored.  BJ:YNWGNGI:Bowel sounds are normal. He exhibitsno distension.  Musculoskeletal: He exhibits no edema, no tenderness. Neurological: He isalert. He can follow simple commands.   Motor: B/l UE 4-/5 proximal to distal  B/l LE: HF 2/5, KE 4/5, ADF/PF 4/5 (stable) Skin iswarmand dry. He isnot diaphoretic.Sternal incision is intact.   Psychiatric: Pleasant. Tangential  Assessment/Plan: 1.  Functional deficits and decreased mobility secondary to debility after multiple medical complications which require 3+ hours per day of interdisciplinary therapy in a comprehensive inpatient rehab setting. Physiatrist is providing close team supervision and 24 hour management of active medical problems listed below. Physiatrist and rehab team  continue to assess barriers to discharge/monitor patient progress toward functional and medical goals.  Function:  Bathing Bathing position   Position: Sitting EOB  Bathing parts Body parts bathed by patient: Right upper leg, Front perineal area, Left upper leg, Right arm, Left arm, Chest, Abdomen Body parts bathed by helper: Right lower leg, Left lower leg, Buttocks, Back  Bathing assist        Upper Body Dressing/Undressing Upper body dressing   What is the patient wearing?: Pull over shirt/dress     Pull over shirt/dress - Perfomed by patient: Thread/unthread right sleeve, Thread/unthread left sleeve, Pull shirt over trunk, Put head through opening Pull over shirt/dress - Perfomed by helper: Put head through opening        Upper body assist Assist Level: Supervision or verbal cues      Lower Body Dressing/Undressing Lower body dressing   What is the patient wearing?: Pants, Non-skid slipper socks     Pants- Performed by patient: Thread/unthread left pants leg, Pull pants up/down Pants- Performed by helper: Thread/unthread right pants leg   Non-skid slipper socks- Performed by helper: Don/doff right sock, Don/doff left sock   Socks - Performed by helper: Don/doff right sock, Don/doff left sock   Shoes - Performed by helper: Don/doff right shoe, Don/doff left shoe          Lower body assist Assist for lower body dressing: Touching or steadying assistance (Pt > 75%)(with use of reacher and long shoe horn)      Toileting Toileting Toileting activity did not occur: Safety/medical concerns Toileting steps completed by patient: Adjust clothing prior to toileting, Performs perineal hygiene, Adjust clothing after  toileting Toileting steps completed by helper: Adjust clothing prior to toileting, Performs perineal hygiene, Adjust clothing after toileting Toileting Assistive Devices: Grab bar or rail  Toileting assist Assist level: Set up/obtain supplies    Transfers Chair/bed transfer   Chair/bed transfer method: Stand pivot Chair/bed transfer assist level: Touching or steadying assistance (Pt > 75%) Chair/bed transfer assistive device: Patent attorneyWalker     Locomotion Ambulation     Max distance: 8 Assist level: Touching or steadying assistance (Pt > 75%)   Wheelchair   Type: Manual Max wheelchair distance: 50' Assist Level: Touching or steadying assistance (Pt > 75%)  Cognition Comprehension Comprehension assist level: Understands basic 75 - 89% of the time/ requires cueing 10 - 24% of the time  Expression Expression assist level: Expresses basic 75 - 89% of the time/requires cueing 10 - 24% of the time. Needs helper to occlude trach/needs to repeat words.  Social Interaction Social Interaction assist level: Interacts appropriately 75 - 89% of the time - Needs redirection for appropriate language or to initiate interaction.  Problem Solving Problem solving assist level: Solves basic 75 - 89% of the time/requires cueing 10 - 24% of the time  Memory Memory assist level: Recognizes or recalls 75 - 89% of the time/requires cueing 10 - 24% of the time   Medical Problem List and Plan: 1.   Debility secondary to coronary artery disease and left ventricular rupture with acute systolic heart failure  Cont CIR 2. DVT Prophylaxis/Anticoagulation: Pharmaceutical:Lovenox 3. Pain Management: Use acetaminophen as needed 4. Mood:[name] to follow for evaluation and support.   Requires significant amount of motivation 5. Neuropsych: This patientis not fullycapable of making decisions on hisown behalf. 6. Skin/Wound Care:Monitor surgical wounds daily for healing . 7. Fluids/Electrolytes/Nutrition:Monitor I/O. Added water flushes qid. .   Advanced to D2 honey on 12/6  IVF qhs   Magnesium within normal limits on 12/6 8. ESBL E coli (penile meatus): Completed meropenem and Vancomycin  9. Hypokalemia: Being Managed with runs of K+. Added supplement via  NG.  K+ 3.8 on 12/7  Con to monitor 10.Anoxic BI: Continue Seroquel and wean over next few days.   Establish better sleep wake cycle 11. Leukocytosis: Continue to monitor for trends   Afebrile   WBCs 11.5 on 12/7, improving  Cont to monitor 12. ZOX:WRUEAVWUJWPAF:Amiodarone 200 mg bid and coreg addedon11/29 13. COPD with chronic hypoxic/hypercarbic failure:  Wean supplemental O2 as able. Encouraging IS 14. Acute systolic heart failure due to cardiogenic shock: EF improved to 40%. No lasix. Monitor weight for signs of overload. Check weights daily.  Filed Weights   05/06/17 0459 05/07/17 0511 05/08/17 0601  Weight: 90.7 kg (199 lb 15.3 oz) 88 kg (194 lb 0.1 oz) 86.4 kg (190 lb 7.6 oz)   15. ABLA: Monitor H/H serially.    Hb  9.3 on 12/7   Cont to monitor 16. Hyperglycemia- resolved off TF, d/ced CBG    CBG (last 3)  Recent Labs    05/06/17 1200 05/06/17 1659 05/06/17 2001  GLUCAP 99 87 111*    Cont to monitor 17. Essential HTN Vitals:   05/08/17 0545 05/08/17 0913  BP: (!) 126/57 (!) 110/48  Pulse: 72 77  Resp: 20   Temp: 99 F (37.2 C)   SpO2: 95%     Controlled on 12/10, will decrease cozaar to 12.5 BID and will hold AM today  LOS (Days) 11 A FACE TO FACE EVALUATION WAS PERFORMED  Terika Pillard Karis JubaAnil Oral Remache, MD 05/08/2017 9:33 AM

## 2017-05-08 NOTE — Progress Notes (Signed)
Occupational Therapy Session Note  Patient Details  Name: [name] MRN: [id number] Date of Birth: [birth year]  Today's Date: 05/08/2017 OT Individual Time: 0930-1030 OT Individual Time Calculation (min): 60 min    Short Term Goals: Week 2:  OT Short Term Goal 1 (Week 2): STGs = LTGs  Skilled Therapeutic Interventions/Progress Updates:    Pt received in bed stating he felt that he could not do much activity as his "blood levels seem off".  Pt spent a great deal of time in the session discussing his concern about his medications and how it is affecting his blood.  Informed pt I would refer his question to the MD.  Pt was able to focus on the tasks without cuing this am. With encouragement he sat to EOB with steadying A, stood to Rw with S, transferred to w/c, ambulated in and out of bathroom with RW with close S.  Pt needed several rest breaks between each stand or transfer.  His blood pressure continues to be low.  Pt toileted with min A, sat at sink in w/c to wash face and hands.  He declined a full bath today.  Pt agreeable to resting in w/c. Pt with O2 on, quick release belt on, call light in reach.  Therapy Documentation Precautions:  Precautions Precautions: Sternal, Fall Restrictions Weight Bearing Restrictions: No(sternal precautions) Other Position/Activity Restrictions: Sternal precautions    Vital Signs: Therapy Vitals Pulse Rate: 73 BP: (!) 117/48 Patient Position (if appropriate): Sitting Oxygen Therapy SpO2: 97 % Pain: Pain Assessment Pain Assessment: No/denies pain   ADL: ADL ADL Comments: Refer to functional navigator  See Function Navigator for Current Functional Status.   Therapy/Group: Individual Therapy  [name] 05/08/2017, 10:44 AM

## 2017-05-09 ENCOUNTER — Inpatient Hospital Stay (HOSPITAL_COMMUNITY): Payer: Medicare Other | Admitting: Physical Therapy

## 2017-05-09 ENCOUNTER — Inpatient Hospital Stay (HOSPITAL_COMMUNITY): Payer: Medicare Other | Admitting: Occupational Therapy

## 2017-05-09 ENCOUNTER — Inpatient Hospital Stay (HOSPITAL_COMMUNITY): Payer: Medicare Other | Admitting: Speech Pathology

## 2017-05-09 ENCOUNTER — Other Ambulatory Visit: Payer: Self-pay

## 2017-05-09 MED ORDER — SPIRONOLACTONE 12.5 MG HALF TABLET
12.5000 mg | ORAL_TABLET | Freq: Every day | ORAL | Status: DC
  Administered 2017-05-09 – 2017-05-11 (×3): 12.5 mg via ORAL
  Filled 2017-05-09 (×3): qty 1

## 2017-05-09 MED ORDER — PANTOPRAZOLE SODIUM 40 MG PO TBEC
40.0000 mg | DELAYED_RELEASE_TABLET | Freq: Every day | ORAL | Status: DC
  Administered 2017-05-10 – 2017-05-13 (×4): 40 mg via ORAL
  Filled 2017-05-09 (×4): qty 1

## 2017-05-09 NOTE — Progress Notes (Signed)
Speech Language Pathology Daily Session Note  Patient Details  Name: [name] MRN: [id number] Date of Birth: Feb 27, 1951  Today's Date: 05/09/2017 SLP Individual Time: [id number] SLP Individual Time Calculation (min): 66 min  Short Term Goals: Week 2: SLP Short Term Goal 1 (Week 2): Pt will consume therapeutic trials of ice chips with minimal overt s/s of aspiration over 3 consecutive sessions prior to repeat objective assessment.   SLP Short Term Goal 2 (Week 2): Pt will sustain his attention to functional tasks for 10 minutes with min verbal cues for redirection.   SLP Short Term Goal 3 (Week 2): Pt will maintain a topic of conversation for at least 5 turns with supervision verbal cues.    SLP Short Term Goal 4 (Week 2): Pt will complete basic, functional tasks with supervision verbal cues for problem solving.   SLP Short Term Goal 5 (Week 2): Pt will consume dys 2 textures and honey thick liquids with supervision cues for use of swallowing precautions.   SLP Short Term Goal 6 (Week 2): Pt will return demonstration of at least 2 safety precautions with min assist verbal cues.    Skilled Therapeutic Interventions:   Pt was seen for skilled ST targeting cognitive and dysphagia goals.  Pt was sitting upright in wheelchair upon therapist's arrival nad agreeable to participating in therapies.  Pt still slightly anxious but was more easily redirectable than in previous therapy sessions.  Pt reported that he was very excited because he was able to use his cell phone to make a phone call to his significant other.  Pt utilized swallowing precautions during his lunch meal with mod I and demonstrated no overt s/s of aspiration with solids or liquids.  Pt was mildly verbose during his meal and needed min verbal cues for redirection to task.  Overall, pt is making improvements towards goals.    Function:  Eating Eating   Modified Consistency Diet: Yes Eating Assist Level: Supervision or verbal cues           Cognition Comprehension Comprehension assist level: Follows basic conversation/direction with extra time/assistive device  Expression   Expression assist level: Expresses basic needs/ideas: With extra time/assistive device  Social Interaction Social Interaction assist level: Interacts appropriately 75 - 89% of the time - Needs redirection for appropriate language or to initiate interaction.  Problem Solving Problem solving assist level: Solves basic 75 - 89% of the time/requires cueing 10 - 24% of the time  Memory Memory assist level: Recognizes or recalls 75 - 89% of the time/requires cueing 10 - 24% of the time    Pain Pain Assessment Pain Assessment: No/denies pain  Therapy/Group: Individual Therapy  Hamdi Vari, Melanee SpryNicole L 05/09/2017, 12:39 PM

## 2017-05-09 NOTE — Progress Notes (Signed)
[place] PHYSICAL MEDICINE & REHABILITATION     PROGRESS NOTE   Subjective/Complaints: Pt seen laying in bed this AM.  He slept well overnight.  He is still sleepy this AM.  He feels he is getting stronger.   ROS: Denies CP, SOB, N/V/D.  Objective: Vital Signs: Blood pressure 132/74, pulse 82, temperature 98.5 F (36.9 C), temperature source Oral, resp. rate 16, height 5\' 7"  (1.702 m), weight 89.3 kg (196 lb 13.9 oz), SpO2 97 %. No results found. No results for input(s): WBC, HGB, HCT, PLT in the last 72 hours. No results for input(s): NA, K, CL, GLUCOSE, BUN, CREATININE, CALCIUM in the last 72 hours.  Invalid input(s): CO CBG (last 3)  Recent Labs    05/06/17 1200 05/06/17 1659 05/06/17 2001  GLUCAP 99 87 111*    Wt Readings from Last 3 Encounters:  05/09/17 89.3 kg (196 lb 13.9 oz)  04/27/17 88.3 kg (194 lb 10.7 oz)    Physical Exam:  Constitutional: He appearswell-developedand well-nourished.No distress. HENT: Normocephalicand atraumatic.  Eyes:EOMare normal. No discharge.  Cardiovascular: Normal rate. +murmur. No JVD Respiratory:Lungs clear. Unlabored.  WJ:XBJYNGI:Bowel sounds are normal. He exhibitsno distension.  Musculoskeletal: He exhibits no edema, no tenderness. Neurological: He isalert. He can follow simple commands.   Motor: B/l UE 4-/5 proximal to distal  B/l LE: HF 3-/5, KE 4/5, ADF/PF 4/5  Skin iswarmand dry. He isnot diaphoretic.Sternal incision is intact.   Psychiatric: Pleasant. Tangential  Assessment/Plan: 1.  Functional deficits and decreased mobility secondary to debility after multiple medical complications which require 3+ hours per day of interdisciplinary therapy in a comprehensive inpatient rehab setting. Physiatrist is providing close team supervision and 24 hour management of active medical problems listed below. Physiatrist and rehab team continue to assess barriers to discharge/monitor patient progress toward functional and  medical goals.  Function:  Bathing Bathing position Bathing activity did not occur: N/A Position: Sitting EOB  Bathing parts Body parts bathed by patient: Right upper leg, Front perineal area, Left upper leg, Right arm, Left arm, Chest, Abdomen Body parts bathed by helper: Right lower leg, Left lower leg, Buttocks, Back  Bathing assist        Upper Body Dressing/Undressing Upper body dressing   What is the patient wearing?: Pull over shirt/dress     Pull over shirt/dress - Perfomed by patient: Thread/unthread right sleeve, Thread/unthread left sleeve, Pull shirt over trunk, Put head through opening Pull over shirt/dress - Perfomed by helper: Put head through opening        Upper body assist Assist Level: Supervision or verbal cues      Lower Body Dressing/Undressing Lower body dressing   What is the patient wearing?: Pants, Non-skid slipper socks     Pants- Performed by patient: Thread/unthread left pants leg, Pull pants up/down Pants- Performed by helper: Thread/unthread right pants leg   Non-skid slipper socks- Performed by helper: Don/doff right sock, Don/doff left sock   Socks - Performed by helper: Don/doff right sock, Don/doff left sock   Shoes - Performed by helper: Don/doff right shoe, Don/doff left shoe          Lower body assist Assist for lower body dressing: Touching or steadying assistance (Pt > 75%)(with use of reacher and long shoe horn)      Toileting Toileting Toileting activity did not occur: Safety/medical concerns Toileting steps completed by patient: Adjust clothing prior to toileting, Performs perineal hygiene Toileting steps completed by helper: Adjust clothing after toileting Toileting Assistive Devices: Grab  bar or rail  Toileting assist Assist level: Set up/obtain supplies   Transfers Chair/bed transfer Chair/bed transfer activity did not occur: N/A Chair/bed transfer method: Stand pivot Chair/bed transfer assist level: Touching or  steadying assistance (Pt > 75%) Chair/bed transfer assistive device: Patent attorneyWalker     Locomotion Ambulation Ambulation activity did not occur: N/A   Max distance: 8 Assist level: Touching or steadying assistance (Pt > 75%)   Wheelchair Wheelchair activity did not occur: N/A Type: Manual Max wheelchair distance: 50' Assist Level: Touching or steadying assistance (Pt > 75%)  Cognition Comprehension Comprehension assist level: Understands basic 75 - 89% of the time/ requires cueing 10 - 24% of the time  Expression Expression assist level: Expresses basic 75 - 89% of the time/requires cueing 10 - 24% of the time. Needs helper to occlude trach/needs to repeat words.  Social Interaction Social Interaction assist level: Interacts appropriately 90% of the time - Needs monitoring or encouragement for participation or interaction.  Problem Solving Problem solving assist level: Solves basic 75 - 89% of the time/requires cueing 10 - 24% of the time  Memory Memory assist level: Recognizes or recalls 75 - 89% of the time/requires cueing 10 - 24% of the time   Medical Problem List and Plan: 1.   Debility secondary to coronary artery disease and left ventricular rupture with acute systolic heart failure  Cont CIR 2. DVT Prophylaxis/Anticoagulation: Pharmaceutical:Lovenox 3. Pain Management: Use acetaminophen as needed 4. Mood:[name] to follow for evaluation and support.   Requires significant amount of motivation 5. Neuropsych: This patientis not fullycapable of making decisions on hisown behalf. 6. Skin/Wound Care:Monitor surgical wounds daily for healing . 7. Fluids/Electrolytes/Nutrition:Monitor I/O. Added water flushes qid. .   Advanced to D2 honey on 12/6  IVF qhs   Magnesium within normal limits on 12/6 8. ESBL E coli (penile meatus): Completed meropenem and Vancomycin  9. Hypokalemia: Being Managed with runs of K+.   K+ 3.8 on 12/7  Labs ordered for tomorrow  Con to  monitor 10.?Anoxic BI: Seroquel d/ced  Establish better sleep wake cycle 11. Leukocytosis: Continue to monitor for trends   Afebrile   WBCs 11.5 on 12/7, improving  Labs ordered for tomorrow  Cont to monitor 12. AVW:[id number]:Amiodarone 200 mg bid and coreg addedon11/29 13. COPD with chronic hypoxic/hypercarbic failure:  Wean supplemental O2 as able.    Encouraging IS 14. Acute systolic heart failure due to cardiogenic shock: EF improved to 35-40% on Echo on 12/10. No lasix. Monitor weight for signs of overload. Check weights daily.  Filed Weights   05/07/17 0511 05/08/17 0601 05/09/17 0604  Weight: 88 kg (194 lb 0.1 oz) 86.4 kg (190 lb 7.6 oz) 89.3 kg (196 lb 13.9 oz)   15. ABLA: Monitor H/H serially.    Hb  9.3 on 12/7   Labs ordered for tomorrow  Cont to monitor 16. Hyperglycemia- resolved off TF, d/ced CBG    CBG (last 3)  Recent Labs    05/06/17 1200 05/06/17 1659 05/06/17 2001  GLUCAP 99 87 111*    Cont to monitor 17. Essential HTN Vitals:   05/09/17 0604 05/09/17 0748  BP: (!) 124/56 132/74  Pulse: 79 82  Resp: 16   Temp: 98.5 F (36.9 C)   SpO2: 97%    Decrease cozaar to 12.5 BID and will hold AM today   Controlled on 12/11  LOS (Days) 12 A FACE TO FACE EVALUATION WAS PERFORMED  Kare Dado Karis JubaAnil Mekisha Bittel, MD 05/09/2017 8:58 AM

## 2017-05-09 NOTE — Progress Notes (Signed)
Physical Therapy Note  Patient Details  Name: [name] MRN: [id number] Date of Birth: 1950/11/04 Today's Date: 05/09/2017    Time: 1450-1530 40 minutes  1:1 No c/o pain.  Pt agreeable to therapy with increased time and encouragement.  Pt performs supine to sit with supervision.  Sit to stand with min guard. Gait with RW 100' x 2 with close supervision, frequent standing rest breaks.  Seated therex 3 x 5 LAQ, hip flex, AP, hip abd/add.  Pt requires mod A for sit to supine.  Pt left in bed with needs at hand.   [name] 05/09/2017, 3:32 PM

## 2017-05-09 NOTE — Progress Notes (Signed)
Occupational Therapy Session Note  Patient Details  Name: [name] MRN: [id number] Date of Birth: [birth year]  Today's Date: 05/09/2017 OT Individual Time: 0930-1030 OT Individual Time Calculation (min): 60 min    Short Term Goals: Week 2:  OT Short Term Goal 1 (Week 2): STGs = LTGs   (LTGs of memory and attention downgraded from mod I to supervision)  Skilled Therapeutic Interventions/Progress Updates:    Pt seen this session for BADL training with a focus on activity tolerance.  Pt received in bed and it took a significant amount of time to get him out of bed and started as he would constantly distract himself from the goal of getting out of bed discussing the same topic numerous times. Pt expressed frustration about having to do therapy as he felt he couldn't physically do it, yet he stated he wasn't able to go home Saturday. He felt the team was "kicking him out".  Explained to pt that LOS can be adjusted longer, BUT there has to be clear evidence pt will progress further and participate. Pt felt everyone was mad at him, clarified that is not the case, that the team is working to get him stronger and more independent. This conversation continued as pt was having difficulty problem solving the entire situation.  Eventually, pt ambulated to the bathroom with RW and toileted with S.  He needs assist donning/doffing pants over feet and donning socks, otherwise he is able to complete the rest of his dressing.  Did not complete UB self care today as pt was connected to an IV.  Pt resting in w/c with quick release belt on and all needs met.  Therapy Documentation Precautions:  Precautions Precautions: Sternal, Fall Restrictions Weight Bearing Restrictions: No Other Position/Activity Restrictions: Sternal precautions  Pain: Pain Assessment Pain Assessment: No/denies pain ADL: ADL ADL Comments: Refer to functional navigator  See Function Navigator for Current Functional  Status.   Therapy/Group: Individual Therapy  Semaja Lymon 05/09/2017, 12:40 PM

## 2017-05-10 ENCOUNTER — Inpatient Hospital Stay (HOSPITAL_COMMUNITY): Payer: Medicare Other | Admitting: Occupational Therapy

## 2017-05-10 ENCOUNTER — Other Ambulatory Visit: Payer: Self-pay

## 2017-05-10 ENCOUNTER — Inpatient Hospital Stay (HOSPITAL_COMMUNITY): Payer: Medicare Other | Admitting: Physical Therapy

## 2017-05-10 ENCOUNTER — Ambulatory Visit (HOSPITAL_COMMUNITY): Payer: Medicare Other | Admitting: Speech Pathology

## 2017-05-10 DIAGNOSIS — I10 Essential (primary) hypertension: Secondary | ICD-10-CM

## 2017-05-10 LAB — BASIC METABOLIC PANEL
Anion gap: 8 (ref 5–15)
BUN: 10 mg/dL (ref 6–20)
CALCIUM: 8.3 mg/dL — AB (ref 8.9–10.3)
CHLORIDE: 100 mmol/L — AB (ref 101–111)
CO2: 30 mmol/L (ref 22–32)
CREATININE: 0.75 mg/dL (ref 0.61–1.24)
GFR calc non Af Amer: >60 mL/min (ref >60)
GLUCOSE: 81 mg/dL (ref 65–99)
Potassium: 3.9 mmol/L (ref 3.5–5.1)
Sodium: 138 mmol/L (ref 135–145)

## 2017-05-10 LAB — CBC WITH DIFFERENTIAL/PLATELET
BASOS PCT: 1 %
Basophils Absolute: 0.1 10*3/uL (ref 0.0–0.1)
EOS ABS: 0.3 10*3/uL (ref 0.0–0.7)
EOS PCT: 4 %
HCT: 32.3 % — ABNORMAL LOW (ref 39.0–52.0)
Hemoglobin: 9.7 g/dL — ABNORMAL LOW (ref 13.0–17.0)
LYMPHS ABS: 1.3 10*3/uL (ref 0.7–4.0)
Lymphocytes Relative: 17 %
MCH: 27.8 pg (ref 26.0–34.0)
MCHC: 30 g/dL (ref 30.0–36.0)
MCV: 92.6 fL (ref 78.0–100.0)
MONO ABS: 0.5 10*3/uL (ref 0.1–1.0)
MONOS PCT: 6 %
NEUTROS PCT: 72 %
Neutro Abs: 5.5 10*3/uL (ref 1.7–7.7)
PLATELETS: 275 10*3/uL (ref 150–400)
RBC: 3.49 MIL/uL — ABNORMAL LOW (ref 4.22–5.81)
RDW: 17.5 % — AB (ref 11.5–15.5)
WBC: 7.7 10*3/uL (ref 4.0–10.5)

## 2017-05-10 MED ORDER — LOSARTAN POTASSIUM 25 MG PO TABS
12.5000 mg | ORAL_TABLET | Freq: Every day | ORAL | Status: DC
  Administered 2017-05-10: 12.5 mg
  Filled 2017-05-10 (×2): qty 0.5

## 2017-05-10 NOTE — Progress Notes (Signed)
Occupational Therapy Session Note  Patient Details  Name: [name] MRN: [id number] Date of Birth: 1950/06/02  Today's Date: 05/10/2017 OT Individual Time: 1020-1100 OT Individual Time Calculation (min): 40 min    Short Term Goals: Week 2:  OT Short Term Goal 1 (Week 2): STGs = LTGs  Skilled Therapeutic Interventions/Progress Updates:    Pt received in w/c fully dressed and on room air. Pt agreeable to therapy and he was in a positive mood compared to previous days.  Pt taken to gym and he worked on standing balance and standing endurance exercises at parallel bars with close S.  He tolerated about 1.5-2 min of exercise at a time before he needed a break. Pt was able to repeat several standing sets.  His O2 sats remained at 96-98% throughout the session.  Pt c/o back pain, provided heat pack to back while we discussed his d/c plan. Pt is now feeling anxious to go home.  Pt taken back to room and he transferred to bed. Bed alarm set and all needs met.  Therapy Documentation Precautions:  Precautions Precautions: Sternal, Fall Restrictions Weight Bearing Restrictions: No Other Position/Activity Restrictions: Sternal precautions    Vital Signs: Oxygen Therapy SpO2: 97 % O2 Device: Not Delivered Pain: Pain Assessment Pain Assessment: 0-10 Pain Score: 3  Faces Pain Scale: No hurt Pain Type: Chronic pain Pain Location: Back Pain Orientation: Lower Pain Intervention(s): Heat applied ADL: ADL ADL Comments: Refer to functional navigator  See Function Navigator for Current Functional Status.   Therapy/Group: Individual Therapy  [place] 05/10/2017, 12:33 PM

## 2017-05-10 NOTE — Progress Notes (Signed)
Speech Language Pathology Daily Session Note  Patient Details  Name: Arlana HoveGary Pesch MRN: [id number] Date of Birth: 12-16-1950  Today's Date: 05/10/2017 SLP Individual Time: 1300-1400 SLP Individual Time Calculation (min): 60 min  Short Term Goals: Week 2: SLP Short Term Goal 1 (Week 2): Pt will consume therapeutic trials of ice chips with minimal overt s/s of aspiration over 3 consecutive sessions prior to repeat objective assessment.   SLP Short Term Goal 2 (Week 2): Pt will sustain his attention to functional tasks for 10 minutes with min verbal cues for redirection.   SLP Short Term Goal 3 (Week 2): Pt will maintain a topic of conversation for at least 5 turns with supervision verbal cues.    SLP Short Term Goal 4 (Week 2): Pt will complete basic, functional tasks with supervision verbal cues for problem solving.   SLP Short Term Goal 5 (Week 2): Pt will consume dys 2 textures and honey thick liquids with supervision cues for use of swallowing precautions.   SLP Short Term Goal 6 (Week 2): Pt will return demonstration of at least 2 safety precautions with min assist verbal cues.    Skilled Therapeutic Interventions:  Pt was seen for skilled ST targeting goals for cognition and dysphagia.  Pt consumed dys 2 textures and honey thick liquids via cup sips with mod I use of swallowing precautions.  No overt s/s of aspiration with solids or thickened liquids.  Pt has decreased and inconsistent carryover of rationale for currently prescribed diet; therefore, SLP reviewed and reinforced recommendations with pt able to utilize teach back to demonstrate understanding with min question cues.  Pt needed mod assist verbal cues for redirection due to distraction to internal stimuli.  Therapist facilitated the session with a medication management task targeting recall of new information.  Pt was only able to recall function of 1/7 medications when named.  Will address problem solving and task organization with  use of a pill box at next available appointment.  Continue per current plan of care.    Function:  Eating Eating   Modified Consistency Diet: Yes Eating Assist Level: More than reasonable amount of time           Cognition Comprehension Comprehension assist level: Follows basic conversation/direction with extra time/assistive device  Expression   Expression assist level: Expresses basic needs/ideas: With extra time/assistive device  Social Interaction Social Interaction assist level: Interacts appropriately 75 - 89% of the time - Needs redirection for appropriate language or to initiate interaction.  Problem Solving Problem solving assist level: Solves basic 75 - 89% of the time/requires cueing 10 - 24% of the time  Memory Memory assist level: Recognizes or recalls 50 - 74% of the time/requires cueing 25 - 49% of the time    Pain Pain Assessment Pain Assessment: No/denies pain  Therapy/Group: Individual Therapy  Dorina Ribaudo, Melanee SpryNicole L 05/10/2017, 2:13 PM

## 2017-05-10 NOTE — Progress Notes (Signed)
Physical Therapy Note  Patient Details  Name: Ronald HoveGary [name] MRN: [id number] Date of Birth: [birth year] Today's Date: 05/10/2017    Time: 830-905 35 minutes  1:1 No c/o pain. Pt requests to use restroom and change clothes.  Pt able to perform transfers and gait with RW with supervision.  Doffed pants and performed hygiene with supervision, donning pants, shirt and underwear with min A. Pt perseverating on BP, checked twice during session 115/49, 116/53.  Pt on room air with spO2 95%.   DONAWERTH,KAREN 05/10/2017, 9:07 AM

## 2017-05-10 NOTE — Discharge Summary (Signed)
Physician Discharge Summary  Patient ID: [name] MRN: [id number] DOB/AGE: [birth year] 66 y.o.  Admit date: 04/27/2017 Discharge date: 05/13/2017  Discharge Diagnoses:  Principal Problem:   Anoxic brain injury [facility](HCC) Active Problems:   COPD (chronic obstructive pulmonary disease) (HCC)   Dysphagia   Physical debility   Chronic systolic (congestive) heart failure (HCC)   CAD in native artery   Hyperglycemia   Hypokalemia   Essential hypertension   Rash   Discharged Condition: stable   Significant  Diagnostic Studies:  Dg Chest Port 1 View  Result Date: 04/27/2017 CLINICAL DATA:  Respiratory failure, shortness of Breath EXAM: PORTABLE CHEST 1 VIEW COMPARISON:  04/26/2017 FINDINGS: Cardiomegaly with vascular congestion. Small left pleural effusion, stable. Left lower lobe atelectasis or consolidation, unchanged. No focal opacity on the right. Right PICC line is unchanged. IMPRESSION: Cardiomegaly with vascular congestion. Left lower lobe opacity and small left effusion, stable. Electronically Signed   By: Charlett NoseKevin  Dover M.D.   On: 04/27/2017 08:36   Dg Chest Port 1v Same Day  Result Date: 04/29/2017 CLINICAL DATA:  Confusion EXAM: PORTABLE CHEST 1 VIEW COMPARISON:  04/27/2017 FINDINGS: Small bilateral pleural effusions. Bibasilar atelectasis or infiltrates, left greater than right. Cardiomegaly. No overt edema. Right PICC line remains in place, unchanged. IMPRESSION: Small bilateral pleural effusions with bibasilar atelectasis or infiltrates, left greater than right. Cardiomegaly. Electronically Signed   By: Charlett NoseKevin  Dover M.D.   On: 04/29/2017 11:46        Labs:  Basic Metabolic Panel: BMP Latest Ref Rng & Units 05/12/2017 05/10/2017 05/05/2017  Glucose 65 - 99 mg/dL 99 81 409(W126(H)  BUN 6 - 20 mg/dL 10 10 18   Creatinine 0.61 - 1.24 mg/dL 1.190.82 1.470.75 8.290.76  Sodium 135 - 145 mmol/L 141 138 141  Potassium 3.5 - 5.1 mmol/L 4.0 3.9 3.8  Chloride 101 - 111 mmol/L 105 100(L) 102  CO2 22  - 32 mmol/L 31 30 35(H)  Calcium 8.9 - 10.3 mg/dL 5.6(O8.6(L) 8.3(L) 8.3(L)    CBC: CBC Latest Ref Rng & Units 05/12/2017 05/10/2017 05/05/2017  WBC 4.0 - 10.5 K/uL 10.1 7.7 11.5(H)  Hemoglobin 13.0 - 17.0 g/dL 1.3(Y9.7(L) 8.6(V9.7(L) 7.8(I9.3(L)  Hematocrit 39.0 - 52.0 % 32.1(L) 32.3(L) 31.0(L)  Platelets 150 - 400 K/uL 239 275 424(H)    CBG: No results for input(s): GLUCAP in the last 168 hours.   Brief HPI:    Ronald DecGary Zeigleris a 66 y.o.malewith history of CAD, HTN, COPD,chronic neck pain;who was admitted on 04/11/17 with SOB and malaise due to inferior lateral STEMI, lethargy and cardiogenic shock requiring intubation in ED. History taken from chart review and sister.He underwent cardiac cath with RCA DES and balloon angioplasty of mid LAD. He required IABP as well as neo due to hypotension. Echo showed diffuse hypokinesis with moderate pericardial effusion and EF 25-30%. TEE showed contained rupture with extensive thrombus. He was taken to OR for CABG with resection of LV aneurysm with impella by Dr. Tyrone SageGerhardt on 11/14 and underwent chest closure on 11/19. Hospital course significant for E coli in 1/2 blood cultures and wound, fevers, hypotension as well as  acute on chronic systolic CHF. He was weaned off Neo and tolerated extubation on 11/26.   Hewas started on pureed dietwith pudding thick liquids due to dysphagia. Therapy evaluations showed evidence of debility and CIR was recommended due to functional deficits.    Hospital Course: Ronald HoveGary [name] was admitted to rehab 04/27/2017 for inpatient therapies to consist of PT, ST and OT at least  three hours five days a week. Past admission physiatrist, therapy team and rehab RN have worked together to provide customized collaborative inpatient rehab. Respiratory status has improved and he was weaned off oxygen.  His heart rate has been controlled on amiodarone and coreg. Acute systolic CHF has been monitored with daily weights as well as activity tolerance.  His weight is down to 191 lbs and no other signs of overload noted. Blood pressures have been well controlled and cozaar was decreased to avoid hypotension. Sternal incision is intact and has been healing well without signs or symptoms of infection.   Cardiology has been following at a distance and low spironolactone was added on 12/11.  He developed a rash on his torso and upper thighs on 12/13 question due to this therefore this was discontinued.  He  was treated with steroid cream and benadryl with minimal improvement. He  was started on 5 day course of steroids to help treat his rash.  He was advised to follow up with primary MD if rash does not improves or worsens.    Reactive leucocytosis has resolved and ABLA is stable.  He was maintained on tube feeds for nutritional support till po intake improved. Hyperglycemia due to tube feeds has resolved and CBGs were discontinued.   He was maintained on IVF for hydration due to pudding thick liquids once [place] removed. As his swallow function improved,  he was advanced to dysphagia 2, honey liquids. IVF were discontinued on 12/12 and follow up labs showed ability to maintain adequate hydration on current restrictions. His anxiety levels have improved and he was weaned off Seroquel. He has required a lot of encouragement for consistent participation in therapy and requires multiple rest breaks with activity.   His mentation has improved and he requires supervision with mobility as well as cognitive tasks. He will continue to receive follow up HHPT, HHOT and  HHST by [facility] after discharge.     Rehab course: During patient's stay in rehab weekly team conferences were held to monitor patient's progress, set goals and discuss barriers to discharge. Patient has had improvement in activity tolerance, balance, postural control, as well as ability to compensate for deficits.  He is able to complete ADL task with supervision. He requires supervision for  transfers and to ambulate 43' with RW and supervision. He is able to propel his wheelchair for 46' with min assist.  His cognition has greatly improved and he is able to recall safe swallow strategies at modified independent level but requires supervision for water protocol.  He continued to requires mod assist to complete tasks due to internal distraction.  Family education was scheduled but his sister did not show up for sessions.     Disposition: 01-Home or Self Care  Diet: Dysphagia 2, honey liquids.   Special Instructions: 1. Water protocol between meals after oral care. 2. Contact primary MD if rash gets worse. 3. Continue sternal precautions. No driving.   Discharge Instructions    Ambulatory referral to Physical Medicine Rehab   Complete by:  As directed    1-2 weeks transitional care appt     Allergies as of 05/13/2017   No Known Allergies     Medication List    STOP taking these medications   acetaminophen 160 MG/5ML solution Commonly known as:  TYLENOL Replaced by:  acetaminophen 325 MG tablet   albuterol (2.5 MG/3ML) 0.083% nebulizer solution Commonly known as:  PROVENTIL   feeding supplement (PRO-STAT SUGAR FREE  64) Liqd   feeding supplement (VITAL HIGH PROTEIN) Liqd liquid   Gerhardt's butt cream Crea   guaiFENesin 100 MG/5ML Soln Commonly known as:  ROBITUSSIN   insulin aspart 100 UNIT/ML injection Commonly known as:  novoLOG   meropenem 1 g in sodium chloride 0.9 % 100 mL   mouth rinse Liqd solution   pantoprazole sodium 40 mg/20 mL Pack Commonly known as:  PROTONIX Replaced by:  pantoprazole 40 MG tablet   QUEtiapine 50 MG tablet Commonly known as:  SEROQUEL   traMADol 50 MG tablet Commonly known as:  ULTRAM   vancomycin 1-5 GM/200ML-% Soln Commonly known as:  VANCOCIN     TAKE these medications   acetaminophen 325 MG tablet Commonly known as:  TYLENOL Take 1-2 tablets (325-650 mg total) by mouth every 4 (four) hours as needed for mild  pain. Replaces:  acetaminophen 160 MG/5ML solution   amiodarone 200 MG tablet Commonly known as:  PACERONE Take 1 tablet (200 mg total) by mouth 2 (two) times daily. What changed:  how to take this   aspirin 81 MG chewable tablet Place 1 tablet (81 mg total) into feeding tube daily.   atorvastatin 80 MG tablet Commonly known as:  LIPITOR Take 1 tablet (80 mg total) by mouth daily at 6 PM. What changed:  how to take this   carvedilol 3.125 MG tablet Commonly known as:  COREG Take 1 tablet (3.125 mg total) by mouth 2 (two) times daily with a meal.   losartan 25 MG tablet Commonly known as:  COZAAR Take 0.5 tablets (12.5 mg total) by mouth at bedtime.   pantoprazole 40 MG tablet Commonly known as:  PROTONIX Take 1 tablet (40 mg total) by mouth daily. Replaces:  pantoprazole sodium 40 mg/20 mL Pack   predniSONE 10 MG tablet Commonly known as:  DELTASONE On Sunday take 3 pills with breakfast. Then decrease to 2 pills on Monday and one pill on Tuesday then stop.   RESOURCE THICKENUP CLEAR Powd Thicken all liquids to honey thick consistency What changed:    how much to take  how to take this  when to take this  reasons to take this  additional instructions   senna 8.6 MG Tabs tablet Commonly known as:  SENOKOT Take 1 tablet (8.6 mg total) by mouth at bedtime. Notes to patient:  For constipation   ticagrelor 90 MG Tabs tablet Commonly known as:  BRILINTA Take 1 tablet (90 mg total) by mouth 2 (two) times daily. What changed:  how to take this   triamcinolone cream 0.5 % Commonly known as:  KENALOG Apply topically 4 (four) times daily. Apply thin smear to rash      Follow-up Information    [facility]. Go on 06/02/2017.   Specialty:  Cardiology Why:  9:00 AM, [facility], parking code [id number] Contact information: [address] [id number] [place] [id number] [phone]        Marcello FennelPatel, Ankit Anil, MD Follow up.   Specialty:  Physical Medicine and Rehabilitation Why:  Office will call you with follow up appointment Contact information: [address] [place] [address] [phone]           Signed: Jacquelynn CreeLove, Devaughn Savant S 05/15/2017, 3:30 PM

## 2017-05-10 NOTE — Progress Notes (Signed)
[place] PHYSICAL MEDICINE & REHABILITATION     PROGRESS NOTE   Subjective/Complaints: Patient seen sitting up in his chair this morning eating breakfast. He states he slept well overnight. He has questions about blood thinners and hypotension that were discussed with him yesterday. He called me back later in the room to ask me if he is allowed to get his sternum wet.   ROS: Denies CP, SOB, N/V/D.  Objective: Vital Signs: Blood pressure 96/67, pulse 70, temperature 98 F (36.7 C), temperature source Oral, resp. rate 16, height 5\' 7"  (1.702 m), weight 89 kg (196 lb 3.4 oz), SpO2 98 %. No results found. Recent Labs    05/10/17 0622  WBC 7.7  HGB 9.7*  HCT 32.3*  PLT 275   Recent Labs    05/10/17 0622  NA 138  K 3.9  CL 100*  GLUCOSE 81  BUN 10  CREATININE 0.75  CALCIUM 8.3*   CBG (last 3)  No results for input(s): GLUCAP in the last 72 hours.  Wt Readings from Last 3 Encounters:  05/10/17 89 kg (196 lb 3.4 oz)  04/27/17 88.3 kg (194 lb 10.7 oz)    Physical Exam:  Constitutional: He appearswell-developedand well-nourished.No distress. HENT: Normocephalicand atraumatic.  Eyes:EOMare normal. No discharge.  Cardiovascular: Normal rate. +murmur. No JVD Respiratory:Lungs clear. Unlabored.  ZO:XWRUEGI:Bowel sounds are normal. He exhibitsno distension.  Musculoskeletal: He exhibits no edema, no tenderness. Neurological: He isalert. He can follow simple commands.   Motor: B/l UE 4-/5 proximal to distal  B/l LE: HF 4/5, KE 4/5, ADF/PF 4/5  Skin iswarmand dry. He isnot diaphoretic.Sternal incision is intact.   Psychiatric: Pleasant. Tangential  Assessment/Plan: 1.  Functional deficits and decreased mobility secondary to debility after multiple medical complications which require 3+ hours per day of interdisciplinary therapy in a comprehensive inpatient rehab setting. Physiatrist is providing close team supervision and 24 hour management of active medical problems  listed below. Physiatrist and rehab team continue to assess barriers to discharge/monitor patient progress toward functional and medical goals.  Function:  Bathing Bathing position Bathing activity did not occur: N/A Position: Sitting EOB  Bathing parts Body parts bathed by patient: Right upper leg, Front perineal area, Left upper leg, Right arm, Left arm, Chest, Abdomen Body parts bathed by helper: Right lower leg, Left lower leg, Buttocks, Back  Bathing assist        Upper Body Dressing/Undressing Upper body dressing   What is the patient wearing?: Pull over shirt/dress     Pull over shirt/dress - Perfomed by patient: Thread/unthread right sleeve, Thread/unthread left sleeve, Pull shirt over trunk, Put head through opening Pull over shirt/dress - Perfomed by helper: Put head through opening        Upper body assist Assist Level: Supervision or verbal cues      Lower Body Dressing/Undressing Lower body dressing   What is the patient wearing?: Pants, Non-skid slipper socks     Pants- Performed by patient: Thread/unthread left pants leg, Pull pants up/down Pants- Performed by helper: Thread/unthread right pants leg   Non-skid slipper socks- Performed by helper: Don/doff right sock, Don/doff left sock   Socks - Performed by helper: Don/doff right sock, Don/doff left sock   Shoes - Performed by helper: Don/doff right shoe, Don/doff left shoe          Lower body assist Assist for lower body dressing: Touching or steadying assistance (Pt > 75%)(with use of reacher and long shoe horn)  Toileting Toileting Toileting activity did not occur: Safety/medical concerns Toileting steps completed by patient: Adjust clothing prior to toileting, Performs perineal hygiene Toileting steps completed by helper: Adjust clothing after toileting Toileting Assistive Devices: Grab bar or rail  Toileting assist Assist level: Set up/obtain supplies   Transfers Chair/bed transfer  Chair/bed transfer activity did not occur: N/A Chair/bed transfer method: Stand pivot Chair/bed transfer assist level: Touching or steadying assistance (Pt > 75%) Chair/bed transfer assistive device: Patent attorneyWalker     Locomotion Ambulation Ambulation activity did not occur: N/A   Max distance: 8 Assist level: Touching or steadying assistance (Pt > 75%)   Wheelchair Wheelchair activity did not occur: N/A Type: Manual Max wheelchair distance: 4850' Assist Level: Touching or steadying assistance (Pt > 75%)  Cognition Comprehension Comprehension assist level: Follows basic conversation/direction with extra time/assistive device  Expression Expression assist level: Expresses basic needs/ideas: With extra time/assistive device  Social Interaction Social Interaction assist level: Interacts appropriately 75 - 89% of the time - Needs redirection for appropriate language or to initiate interaction.  Problem Solving Problem solving assist level: Solves basic 75 - 89% of the time/requires cueing 10 - 24% of the time  Memory Memory assist level: Recognizes or recalls 75 - 89% of the time/requires cueing 10 - 24% of the time   Medical Problem List and Plan: 1.   Debility secondary to coronary artery disease and left ventricular rupture with acute systolic heart failure  Cont CIR 2. DVT Prophylaxis/Anticoagulation: Pharmaceutical:Lovenox 3. Pain Management: Use acetaminophen as needed 4. Mood:[name] to follow for evaluation and support.   Requires significant amount of motivation 5. Neuropsych: This patientis not fullycapable of making decisions on hisown behalf. 6. Skin/Wound Care:Monitor surgical wounds daily for healing . 7. Fluids/Electrolytes/Nutrition:Monitor I/O. Added water flushes qid. .   Advanced to D2 honey on 12/6  IVF qhs DC'd on 12/12, will monitor for adequate by mouth intake   Magnesium within normal limits on 12/6 8. ESBL E coli (penile meatus): Completed meropenem and Vancomycin   9. Hypokalemia: Resolved   K+ 3.9 on 12/12  Con to monitor 10.?Anoxic BI: Seroquel d/ced  Establish better sleep wake cycle 11. Leukocytosis: Resolved   Continue to monitor for trends   Afebrile   WBCs 7.7 on 12/12  Cont to monitor 12. ZOX:WRUEAVWUJWPAF:Amiodarone 200 mg bid and coreg addedon11/29 13. COPD with chronic hypoxic/hypercarbic failure:  Wean supplemental O2 as able.    Encouraging IS 14. Acute systolic heart failure due to cardiogenic shock: EF improved to 35-40% on Echo on 12/10. No lasix. Monitor weight for signs of overload. Check weights daily.  Filed Weights   05/08/17 0601 05/09/17 0604 05/10/17 0544  Weight: 86.4 kg (190 lb 7.6 oz) 89.3 kg (196 lb 13.9 oz) 89 kg (196 lb 3.4 oz)   15. ABLA: Monitor H/H serially.    Hb  9.7 on 12/12  Cont to monitor 16. Hyperglycemia- resolved off TF, d/ced CBG    CBG (last 3)  No results for input(s): GLUCAP in the last 72 hours.  Cont to monitor 17. Essential HTN Vitals:   05/09/17 2032 05/10/17 0544  BP: (!) 114/52 96/67  Pulse: 67 70  Resp: 18 16  Temp:  98 F (36.7 C)  SpO2: 97% 98%   Decrease cozaar to 12.5 on 12/12   Controlled on 12/11  LOS (Days) 13 A FACE TO FACE EVALUATION WAS PERFORMED  Ankit Karis JubaAnil Patel, MD 05/10/2017 9:29 AM

## 2017-05-11 ENCOUNTER — Inpatient Hospital Stay (HOSPITAL_COMMUNITY): Payer: Medicare Other | Admitting: Occupational Therapy

## 2017-05-11 ENCOUNTER — Ambulatory Visit (HOSPITAL_COMMUNITY): Payer: Medicare Other | Admitting: Speech Pathology

## 2017-05-11 ENCOUNTER — Inpatient Hospital Stay (HOSPITAL_COMMUNITY): Payer: Medicare Other | Admitting: Speech Pathology

## 2017-05-11 ENCOUNTER — Inpatient Hospital Stay (HOSPITAL_COMMUNITY): Payer: Medicare Other

## 2017-05-11 MED ORDER — ASPIRIN 81 MG PO CHEW
81.0000 mg | CHEWABLE_TABLET | Freq: Every day | ORAL | Status: DC
  Administered 2017-05-12 – 2017-05-13 (×2): 81 mg via ORAL
  Filled 2017-05-11 (×2): qty 1

## 2017-05-11 MED ORDER — AMIODARONE HCL 200 MG PO TABS
200.0000 mg | ORAL_TABLET | Freq: Two times a day (BID) | ORAL | Status: DC
  Administered 2017-05-11 – 2017-05-13 (×4): 200 mg via ORAL
  Filled 2017-05-11 (×4): qty 1

## 2017-05-11 MED ORDER — TRIAMCINOLONE ACETONIDE 0.5 % EX CREA
TOPICAL_CREAM | Freq: Four times a day (QID) | CUTANEOUS | Status: DC
  Administered 2017-05-11 – 2017-05-13 (×8): via TOPICAL
  Filled 2017-05-11 (×2): qty 15

## 2017-05-11 MED ORDER — DIPHENHYDRAMINE HCL 25 MG PO CAPS
25.0000 mg | ORAL_CAPSULE | Freq: Three times a day (TID) | ORAL | Status: DC
  Administered 2017-05-11 – 2017-05-12 (×4): 25 mg via ORAL
  Filled 2017-05-11 (×4): qty 1

## 2017-05-11 MED ORDER — LOSARTAN POTASSIUM 25 MG PO TABS
12.5000 mg | ORAL_TABLET | Freq: Every day | ORAL | Status: DC
  Administered 2017-05-11 – 2017-05-12 (×2): 12.5 mg via ORAL
  Filled 2017-05-11 (×2): qty 0.5

## 2017-05-11 MED ORDER — CARVEDILOL 3.125 MG PO TABS
3.1250 mg | ORAL_TABLET | Freq: Two times a day (BID) | ORAL | Status: DC
  Administered 2017-05-11 – 2017-05-13 (×4): 3.125 mg via ORAL
  Filled 2017-05-11 (×3): qty 1

## 2017-05-11 MED ORDER — ATORVASTATIN CALCIUM 80 MG PO TABS
80.0000 mg | ORAL_TABLET | Freq: Every day | ORAL | Status: DC
  Administered 2017-05-11 – 2017-05-12 (×2): 80 mg via ORAL
  Filled 2017-05-11: qty 1

## 2017-05-11 MED ORDER — TICAGRELOR 90 MG PO TABS
90.0000 mg | ORAL_TABLET | Freq: Two times a day (BID) | ORAL | Status: DC
  Administered 2017-05-11 – 2017-05-13 (×4): 90 mg via ORAL
  Filled 2017-05-11 (×4): qty 1

## 2017-05-11 MED ORDER — SENNA 8.6 MG PO TABS
1.0000 | ORAL_TABLET | Freq: Every day | ORAL | Status: DC
  Administered 2017-05-11: 8.6 mg via ORAL
  Filled 2017-05-11 (×2): qty 1

## 2017-05-11 MED ORDER — SENNOSIDES 8.8 MG/5ML PO SYRP
10.0000 mL | ORAL_SOLUTION | Freq: Every day | ORAL | Status: DC
  Filled 2017-05-11: qty 10

## 2017-05-11 MED ORDER — ALBUTEROL SULFATE (2.5 MG/3ML) 0.083% IN NEBU: 3.0000 mL | INHALATION_SOLUTION | Freq: Four times a day (QID) | RESPIRATORY_TRACT | Status: DC | PRN

## 2017-05-11 MED ORDER — LORATADINE 10 MG PO TABS
10.0000 mg | ORAL_TABLET | Freq: Every day | ORAL | Status: DC
  Administered 2017-05-11 – 2017-05-12 (×2): 10 mg via ORAL
  Filled 2017-05-11 (×2): qty 1

## 2017-05-11 MED ORDER — PRO-STAT SUGAR FREE PO LIQD
30.0000 mL | Freq: Two times a day (BID) | ORAL | Status: DC
  Administered 2017-05-11 – 2017-05-13 (×4): 30 mL via ORAL
  Filled 2017-05-11 (×4): qty 30

## 2017-05-11 NOTE — Progress Notes (Addendum)
Nurse reports diffuse rash. On  Exam--macular rash noted on abdomen, groin and upper thighs extending to flank. Mild itching reported by patient. This is worse today per OT--was not present last week. Question SE due to spironolactone which was added a couple of days. Will discontinue and treat rash with benadryl and triamcinolone cream.

## 2017-05-11 NOTE — Progress Notes (Signed)
[facility] PHYSICAL MEDICINE & REHABILITATION     PROGRESS NOTE   Subjective/Complaints: Patient seen sitting up at the edge of his bed this morning eating breakfast. He states he slept well overnight. He believes he is getting stronger.  ROS: Denies CP, SOB, N/V/D.  Objective: Vital Signs: Blood pressure 140/78, pulse 72, temperature 98.9 F (37.2 C), temperature source Oral, resp. rate 16, height 5\' 7"  (1.702 m), weight 89.1 kg (196 lb 8.3 oz), SpO2 97 %. No results found. Recent Labs    05/10/17 0622  WBC 7.7  HGB 9.7*  HCT 32.3*  PLT 275   Recent Labs    05/10/17 0622  NA 138  K 3.9  CL 100*  GLUCOSE 81  BUN 10  CREATININE 0.75  CALCIUM 8.3*   CBG (last 3)  No results for input(s): GLUCAP in the last 72 hours.  Wt Readings from Last 3 Encounters:  05/11/17 89.1 kg (196 lb 8.3 oz)  04/27/17 88.3 kg (194 lb 10.7 oz)    Physical Exam:  Constitutional: He appearswell-developedand well-nourished.No distress. HENT: Normocephalicand atraumatic.  Eyes:EOMare normal. No discharge.  Cardiovascular:  Normal rate. +murmur. No JVD Respiratory:Lungs clear. Unlabored. GI: Bowel sounds are normal. He exhibitsno distension.  Musculoskeletal: He exhibits no edema, no tenderness. Neurological: He isalert. He can follow simple commands.   Motor: B/l UE 4-/5 proximal to distal  B/l LE: HF 4/5, KE 4/5, ADF/PF 4/5 (improving) Skin iswarmand dry. He isnot diaphoretic.Sternal incision is intact.   Psychiatric: Pleasant. Tangential  Assessment/Plan: 1.  Functional deficits and decreased mobility secondary to debility after multiple medical complications which require 3+ hours per day of interdisciplinary therapy in a comprehensive inpatient rehab setting. Physiatrist is providing close team supervision and 24 hour management of active medical problems listed below. Physiatrist and rehab team continue to assess barriers to discharge/monitor patient progress toward  functional and medical goals.  Function:  Bathing Bathing position Bathing activity did not occur: N/A Position: Sitting EOB  Bathing parts Body parts bathed by patient: Right upper leg, Front perineal area, Left upper leg, Right arm, Left arm, Chest, Abdomen Body parts bathed by helper: Right lower leg, Left lower leg, Buttocks, Back  Bathing assist        Upper Body Dressing/Undressing Upper body dressing   What is the patient wearing?: Pull over shirt/dress     Pull over shirt/dress - Perfomed by patient: Thread/unthread right sleeve, Thread/unthread left sleeve, Pull shirt over trunk, Put head through opening Pull over shirt/dress - Perfomed by helper: Put head through opening        Upper body assist Assist Level: Supervision or verbal cues      Lower Body Dressing/Undressing Lower body dressing   What is the patient wearing?: Pants, Non-skid slipper socks     Pants- Performed by patient: Thread/unthread left pants leg, Pull pants up/down Pants- Performed by helper: Thread/unthread right pants leg   Non-skid slipper socks- Performed by helper: Don/doff right sock, Don/doff left sock   Socks - Performed by helper: Don/doff right sock, Don/doff left sock   Shoes - Performed by helper: Don/doff right shoe, Don/doff left shoe          Lower body assist Assist for lower body dressing: Touching or steadying assistance (Pt > 75%)(with use of reacher and long shoe horn)      Toileting Toileting Toileting activity did not occur: Safety/medical concerns Toileting steps completed by patient: Adjust clothing prior to toileting, Adjust clothing after toileting Toileting steps  completed by helper: Performs perineal hygiene Toileting Assistive Devices: Grab bar or rail  Toileting assist Assist level: Set up/obtain supplies   Transfers Chair/bed transfer Chair/bed transfer activity did not occur: N/A Chair/bed transfer method: Stand pivot Chair/bed transfer assist level:  Touching or steadying assistance (Pt > 75%) Chair/bed transfer assistive device: Patent attorneyWalker     Locomotion Ambulation Ambulation activity did not occur: N/A   Max distance: 8 Assist level: Touching or steadying assistance (Pt > 75%)   Wheelchair Wheelchair activity did not occur: N/A Type: Manual Max wheelchair distance: 4250' Assist Level: Touching or steadying assistance (Pt > 75%)  Cognition Comprehension Comprehension assist level: Follows basic conversation/direction with extra time/assistive device  Expression Expression assist level: Expresses basic needs/ideas: With extra time/assistive device  Social Interaction Social Interaction assist level: Interacts appropriately 75 - 89% of the time - Needs redirection for appropriate language or to initiate interaction.  Problem Solving Problem solving assist level: Solves basic 75 - 89% of the time/requires cueing 10 - 24% of the time  Memory Memory assist level: Recognizes or recalls 50 - 74% of the time/requires cueing 25 - 49% of the time   Medical Problem List and Plan: 1.   Debility secondary to coronary artery disease and left ventricular rupture with acute systolic heart failure  Cont CIR 2. DVT Prophylaxis/Anticoagulation: Pharmaceutical:Lovenox 3. Pain Management: Use acetaminophen as needed 4. Mood:[name] to follow for evaluation and support.   Requires significant amount of motivation 5. Neuropsych: This patientis not fullycapable of making decisions on hisown behalf. 6. Skin/Wound Care:Monitor surgical wounds daily for healing . 7. Fluids/Electrolytes/Nutrition:Monitor I/O. Added water flushes qid. .   Advanced to D2 honey on 12/6  IVF qhs DC'd on 12/12, will monitor for adequate by mouth intake   Magnesium within normal limits on 12/6   Labs ordered for tomorrow  8. ESBL E coli (penile meatus): Completed meropenem and Vancomycin  9. Hypokalemia: Resolved   K+ 3.9 on 12/12   Labs ordered for tomorrow   Con to  monitor 10.?Anoxic BI: Seroquel d/ced  Establish better sleep wake cycle 11. Leukocytosis: Resolved   Continue to monitor for trends   Afebrile   WBCs 7.7 on 12/12  Cont to monitor 12. ZOX:[id number]:Amiodarone 200 mg bid and coreg addedon11/29 13. COPD with chronic hypoxic/hypercarbic failure:  Weaning supplemental O2   Encouraging IS 14. Acute systolic heart failure due to cardiogenic shock: EF improved to 35-40% on Echo on 12/10. No lasix. Monitor weight for signs of overload. Check weights daily.  Filed Weights   05/09/17 0604 05/10/17 0544 05/11/17 0323  Weight: 89.3 kg (196 lb 13.9 oz) 89 kg (196 lb 3.4 oz) 89.1 kg (196 lb 8.3 oz)   15. ABLA: Monitor H/H serially.    Hb  9.7 on 12/12   Labs ordered for tomorrow   Cont to monitor 16. Hyperglycemia- resolved off TF, d/ced CBG    CBG (last 3)  No results for input(s): GLUCAP in the last 72 hours.  Cont to monitor 17. Essential HTN Vitals:   05/11/17 0323 05/11/17 0919  BP: 140/75 140/78  Pulse: 71 72  Resp: 16   Temp: 98.9 F (37.2 C)   SpO2: 97%    Decrease cozaar to 12.5 on 12/12   Controlled on 12/13  LOS (Days) 14 A FACE TO FACE EVALUATION WAS PERFORMED  Clovia Reine Karis JubaAnil Elaya Droege, MD 05/11/2017 9:38 AM

## 2017-05-11 NOTE — Progress Notes (Signed)
Speech Language Pathology Daily Session Note  Patient Details  Name: [name] MRN: [id number] Date of Birth: 1951/05/02  Today's Date: 05/11/2017 SLP Individual Time: 1355-1430 SLP Individual Time Calculation (min): 35 min  Short Term Goals: Week 2: SLP Short Term Goal 1 (Week 2): Pt will consume therapeutic trials of ice chips with minimal overt s/s of aspiration over 3 consecutive sessions prior to repeat objective assessment.   SLP Short Term Goal 2 (Week 2): Pt will sustain his attention to functional tasks for 10 minutes with min verbal cues for redirection.   SLP Short Term Goal 3 (Week 2): Pt will maintain a topic of conversation for at least 5 turns with supervision verbal cues.    SLP Short Term Goal 4 (Week 2): Pt will complete basic, functional tasks with supervision verbal cues for problem solving.   SLP Short Term Goal 5 (Week 2): Pt will consume dys 2 textures and honey thick liquids with supervision cues for use of swallowing precautions.   SLP Short Term Goal 6 (Week 2): Pt will return demonstration of at least 2 safety precautions with min assist verbal cues.    Skilled Therapeutic Interventions:  Pt was seen for skilled ST targeting cognitive and dysphagia goals.  Pt consumed therapeutic trials of thin liquids via cup sips with mod I use of swallowing precautions and no overt s/s of aspiration.  SLP recommends implementing the water protocol to continue working towards diet progression.  SLP provided education regarding parameters of the water protocol.  Pt was able to recall components of the water protocol after a brief delay for 100% accuracy with supervision question cues.  Pt's mentation is much clearer but affect is quite flat.  Pt is also less anxious with fewer cues needed for redirection to task.  Pt was left in bed with bed alarm set and call bell within reach.     Pain Pain Assessment Pain Assessment: No/denies pain  Therapy/Group: Individual Therapy  Monae Topping,  Melanee SpryNicole L 05/11/2017, 4:25 PM

## 2017-05-11 NOTE — Patient Care Conference (Signed)
Inpatient RehabilitationTeam Conference and Plan of Care Update Date: 05/10/2017   Time: 2:50 PM    Patient Name: [name]      Medical Record Number: [id number]  Date of Birth: 09/13/1950 Sex: Male         Room/Bed: 4W19C/4W19C-01 Payor Info: Payor: MEDICARE / Plan: MEDICARE PART B / Product Type: *No Product type* /    Admitting Diagnosis: Debility  Admit Date/Time:  04/27/2017  3:25 PM Admission Comments: No comment available   Primary Diagnosis:  <principal problem not specified> Principal Problem: <principal problem not specified>  Patient Active Problem List   Diagnosis Date Noted  . Essential hypertension   . Hypotension due to drugs   . Labile blood pressure   . Supplemental oxygen dependent   . Confusion   . Hyperglycemia   . Hypokalemia   . Anoxic brain injury (HCC)   . Chronic systolic (congestive) heart failure (HCC)   . CAD in native artery   . Physical debility 04/27/2017  . 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 Acute respiratory failure with hypoxia (HCC)   . Hypoxemia   . Congestive heart failure (CHF) (HCC)   . Dysphagia   . Bacteremia   . Benign essential HTN   . Chronic neck pain   . Sepsis (HCC)   . Acute blood loss anemia   . Tachypnea   . Leukocytosis   . Fever   . Cardiogenic shock (HCC)   . Aneurysm of left ventricle of heart 04/12/2017  . Acute anterior wall MI (HCC)   . Acute pulmonary edema (HCC)   . Acute hypoxemic respiratory failure (HCC)   . ACS (acute coronary syndrome) (HCC)   . Acute heart failure (HCC)   . Encounter for central line placement   . Encounter for management of intra-aortic balloon pump   . STEMI (ST elevation myocardial infarction) (HCC) 04/11/2017  . COPD (chronic obstructive pulmonary disease) (HCC) 04/11/2017  . CHF (congestive heart failure), NYHA class II, acute, combined (HCC) 04/11/2017    Expected Discharge Date: Expected Discharge Date: 05/13/17  Team Members  Present: Physician leading conference: Dr. Maryla MorrowAnkit Patel Social Worker Present: Amada JupiterLucy Mahati Vajda, [name] Nurse Present: Kennon PortelaJeanna Hicks, RN PT Present: Judieth KeensKaren Donaworth, PT OT Present: Roney MansJennifer Smith, OT SLP Present: Jackalyn LombardNicole Page, SLP PPS Coordinator present : Tora DuckMarie Noel, RN, CRRN     Current Status/Progress Goal Weekly Team Focus  Medical   Debility secondary to coronary artery disease and left ventricular rupture with acute systolic heart failure  Improve mobility, endurance, BP, dysphagia  See above   Bowel/Bladder   Continent of bladder/bowel with periods of incontinent episodes reported, Refuse po Senna x 2 days due to frequency of BM,   Maintain continence of B/B with Min assistance  Assess, toileting needs for B/B episodes QS and PRN, Cont to toilet q3 hours and prn Educate and address   Swallow/Nutrition/ Hydration   Dys 2, honey thick liquids; supervision-mod I use of swallowing precautions   supervision   toleration of diet upgrade, family education prior to discharge    ADL's   supervision overall with sit to stand, and ambulation short distances with RW, low activity tolerance, improved self care - S toileting, min A bathing and LB dressing, set up UB dressing  supervision overall with self care and cognition for basic self care  activity tolerance, strengthening, balance, ADL training, pt/family education   Mobility   supervision  supervision overall  family ed, activity  tolerance   Communication             Safety/Cognition/ Behavioral Observations  min-mod assist, needs lots of encouragement and explanation of rationale behind CIR and therapies   min assist   continue to address attention, problem soving and awareness, needs family education prior to discharge    Pain   Denies pain at this time, generalized weakness and soreness improving    < 3   Assess QS and prn, keeping pain level at a tolerable function for patient, Medicate and Educate with documentation f/us   Skin               Rehab Goals Patient on target to meet rehab goals: Yes Rehab Goals Revised: better progress this week *See Care Plan and progress notes for long and short-term goals.     Barriers to Discharge  Current Status/Progress Possible Resolutions Date Resolved   Physician    Medical stability;Decreased caregiver support;Lack of/limited family support;Behavior     See above  Therapies, follow labs, wean supplemental O2 as necessary, optimise BP meds      Nursing                  PT                    OT                  SLP                SW                Discharge Planning/Teaching Needs:  Plan for pt to d/c home with sister, [name]  Teaching to be scheduled this week prior to d/c.   Team Discussion:  Hoping to wean off O2 prior to d/c.  Supervision with rw, min assist LB b/d.  Still very anxious and easily distracted but better overall.  D2, honey and will still be on this at d/c.  Revisions to Treatment Plan:  None    Continued Need for Acute Rehabilitation Level of Care: The patient requires daily medical management by a physician with specialized training in physical medicine and rehabilitation for the following conditions: Daily direction of a multidisciplinary physical rehabilitation program to ensure safe treatment while eliciting the highest outcome that is of practical value to the patient.: Yes Daily medical management of patient stability for increased activity during participation in an intensive rehabilitation regime.: Yes Daily analysis of laboratory values and/or radiology reports with any subsequent need for medication adjustment of medical intervention for : Cardiac problems;Pulmonary problems;Blood pressure problems  Raydin Bielinski 05/11/2017, 3:37 PM

## 2017-05-11 NOTE — Progress Notes (Signed)
Physical Therapy Note  Patient Details  Name: Ronald HoveGary [name] MRN: [id number] Date of Birth: 05-09-1951 Today's Date: 05/11/2017  0930-1000, 30 min individual tx Pain: lower back; declined meds  BP in supine in bed= 119/58, HR 76, O2 sats on room air = 91%.  Therapeutic exercise performed with LE to increase strength for functional mobility: 1 x 10 /R hip abd, straight leg raises; 2 x 10 alternating ankle pumps, bil lower trunk rotation.   Mod cues for counting aloud to prevent Valsalva.   Pt left resting in bed with alarm set and all needs within reach.  See function navigator for current status.  Refoel Palladino 05/11/2017, 7:58 AM

## 2017-05-11 NOTE — Progress Notes (Signed)
Social Work Patient ID: Arlana HoveGary Jowers, male   DOB: 1951/03/16, 66 y.o.   MRN: [id number]   Have reviewed team conference with pt and sister.  Both aware we continue to plan for d/c on Sat.  Have scheduled for family ed to take place tomorrow.  Plan still for pt to d/c home with sister.  I am arranging HH and DME.  Continue to follow.  Mckade Gurka, [name]

## 2017-05-11 NOTE — Progress Notes (Signed)
Occupational Therapy Session Note  Patient Details  Name: [name] MRN: [id number] Date of Birth: 1950-06-20  Today's Date: 05/11/2017 OT Individual Time: 7044-9252 OT Individual Time Calculation (min): 55 min    Short Term Goals: Week 2:  OT Short Term Goal 1 (Week 2): STGs = LTGs  Skilled Therapeutic Interventions/Progress Updates:      Pt seen for BADL retraining of toileting, bathing, and dressing with a focus on activity tolerance and balance.  Pt was in a positive mood today.  He was agreeable to a shower.  Pt needed S only with bed mobility, transfers out of bed to bathroom to toilet and to shower.  S with shower. He cannot reach his feet but likes to have the warm water run over his feet.   For dressing he practiced with the reacher to don underwear and pants with S.  The sock aid was too tight to don socks.  Pt usually uses slip on slippers at home. Returned to bed to rest. Bed alarm set. All needs met.  Therapy Documentation Precautions:  Precautions Precautions: Sternal, Fall Restrictions Weight Bearing Restrictions: No Other Position/Activity Restrictions: Sternal precautions  Vital Signs: Therapy Vitals Pulse Rate: 72 BP: 140/78 Pain: Pain Assessment Pain Assessment: No/denies pain ADL: ADL ADL Comments: Refer to functional navigator  See Function Navigator for Current Functional Status.   Therapy/Group: Individual Therapy  Ashtin Rosner 05/11/2017, 12:45 PM

## 2017-05-11 NOTE — Progress Notes (Signed)
Social Work Patient ID: [name], male   DOB: 1950-06-01, 66 y.o.   MRN: [id number]    [name], Ronald Studzinski R, [name]  Social Worker  General Practice  Patient Care Conference  Signed  Date of Service:  05/11/2017  3:37 PM          Signed          [] Hide copied text  [] Hover for details   Inpatient RehabilitationTeam Conference and Plan of Care Update Date: 05/10/2017   Time: 2:50 PM      Patient Name: [name]      Medical Record Number: [id number]  Date of Birth: 1950-06-01 Sex: Male         Room/Bed: 4W19C/4W19C-01 Payor Info: Payor: MEDICARE / Plan: MEDICARE PART B / Product Type: *No Product type* /     Admitting Diagnosis: Debility  Admit Date/Time:  04/27/2017  3:25 PM Admission Comments: No comment available    Primary Diagnosis:  <principal problem not specified> Principal Problem: <principal problem not specified>       Patient Active Problem List    Diagnosis Date Noted  . Essential hypertension    . Hypotension due to drugs    . Labile blood pressure    . Supplemental oxygen dependent    . Confusion    . Hyperglycemia    . Hypokalemia    . Anoxic brain injury (HCC)    . Chronic systolic (congestive) heart failure (HCC)    . CAD in native artery    . Physical debility 04/27/2017  . 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 Acute respiratory failure with hypoxia (HCC)    . Hypoxemia    . Congestive heart failure (CHF) (HCC)    . Dysphagia    . Bacteremia    . Benign essential HTN    . Chronic neck pain    . Sepsis (HCC)    . Acute blood loss anemia    . Tachypnea    . Leukocytosis    . Fever    . Cardiogenic shock (HCC)    . Aneurysm of left ventricle of heart 04/12/2017  . Acute anterior wall MI (HCC)    . Acute pulmonary edema (HCC)    . Acute hypoxemic respiratory failure (HCC)    . ACS (acute coronary syndrome) (HCC)    . Acute heart failure (HCC)    . Encounter for central line placement    .  Encounter for management of intra-aortic balloon pump    . STEMI (ST elevation myocardial infarction) (HCC) 04/11/2017  . COPD (chronic obstructive pulmonary disease) (HCC) 04/11/2017  . CHF (congestive heart failure), NYHA class II, acute, combined (HCC) 04/11/2017      Expected Discharge Date: Expected Discharge Date: 05/13/17   Team Members Present: Physician leading conference: Dr. Maryla MorrowAnkit Patel Social Worker Present: Amada JupiterLucy Kavitha Lansdale, [name] Nurse Present: Kennon PortelaJeanna Hicks, RN PT Present: Judieth KeensKaren Donaworth, PT OT Present: Roney MansJennifer Smith, OT SLP Present: Jackalyn LombardNicole Page, SLP PPS Coordinator present : Tora DuckMarie Noel, RN, CRRN       Current Status/Progress Goal Weekly Team Focus  Medical     Debility secondary to coronary artery disease and left ventricular rupture with acute systolic heart failure  Improve mobility, endurance, BP, dysphagia  See above   Bowel/Bladder     Continent of bladder/bowel with periods of incontinent episodes reported, Refuse po Senna x 2 days due to frequency of BM,   Maintain continence of B/B with Min  assistance  Assess, toileting needs for B/B episodes QS and PRN, Cont to toilet q3 hours and prn Educate and address   Swallow/Nutrition/ Hydration     Dys 2, honey thick liquids; supervision-mod I use of swallowing precautions   supervision   toleration of diet upgrade, family education prior to discharge    ADL's     supervision overall with sit to stand, and ambulation short distances with RW, low activity tolerance, improved self care - S toileting, min A bathing and LB dressing, set up UB dressing  supervision overall with self care and cognition for basic self care  activity tolerance, strengthening, balance, ADL training, pt/family education   Mobility     supervision  supervision overall  family ed, activity tolerance   Communication               Safety/Cognition/ Behavioral Observations   min-mod assist, needs lots of encouragement and explanation of rationale  behind CIR and therapies   min assist   continue to address attention, problem soving and awareness, needs family education prior to discharge    Pain     Denies pain at this time, generalized weakness and soreness improving    < 3   Assess QS and prn, keeping pain level at a tolerable function for patient, Medicate and Educate with documentation f/us   Skin                 Rehab Goals Patient on target to meet rehab goals: Yes Rehab Goals Revised: better progress this week *See Care Plan and progress notes for long and short-term goals.      Barriers to Discharge   Current Status/Progress Possible Resolutions Date Resolved   Physician     Medical stability;Decreased caregiver support;Lack of/limited family support;Behavior     See above  Therapies, follow labs, wean supplemental O2 as necessary, optimise BP meds      Nursing                 PT                    OT                 SLP            SW              Discharge Planning/Teaching Needs:  Plan for pt to d/c home with sister, [name]  Teaching to be scheduled this week prior to d/c.   Team Discussion:  Hoping to wean off O2 prior to d/c.  Supervision with rw, min assist LB b/d.  Still very anxious and easily distracted but better overall.  D2, honey and will still be on this at d/c.  Revisions to Treatment Plan:  None    Continued Need for Acute Rehabilitation Level of Care: The patient requires daily medical management by a physician with specialized training in physical medicine and rehabilitation for the following conditions: Daily direction of a multidisciplinary physical rehabilitation program to ensure safe treatment while eliciting the highest outcome that is of practical value to the patient.: Yes Daily medical management of patient stability for increased activity during participation in an intensive rehabilitation regime.: Yes Daily analysis of laboratory values and/or radiology reports with any subsequent  need for medication adjustment of medical intervention for : Cardiac problems;Pulmonary problems;Blood pressure problems   Adrian Dinovo 05/11/2017, 3:37 PM  Anselm Pancoast, [place]  Social Worker  General Practice  Patient Care Conference  Signed  Date of Service:  05/04/2017 11:22 AM          Signed          [] Hide copied text  [] Hover for details   Inpatient RehabilitationTeam Conference and Plan of Care Update Date: 05/03/2017   Time: 2:40 PM      Patient Name: [name]      Medical Record Number: [id number]  Date of Birth: September 11, 1950 Sex: Male         Room/Bed: 4W19C/4W19C-01 Payor Info: Payor: MEDICARE / Plan: MEDICARE PART B / Product Type: *No Product type* /     Admitting Diagnosis: Debility  Admit Date/Time:  04/27/2017  3:25 PM Admission Comments: No comment available    Primary Diagnosis:  <principal problem not specified> Principal Problem: <principal problem not specified>       Patient Active Problem List    Diagnosis Date Noted  . Labile blood pressure    . Supplemental oxygen dependent    . Confusion    . Hyperglycemia    . Hypokalemia    . Anoxic brain injury (HCC)    . Chronic systolic (congestive) heart failure (HCC)    . CAD in native artery    . Physical debility 04/27/2017  . 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 Acute respiratory failure with hypoxia (HCC)    . Hypoxemia    . Congestive heart failure (CHF) (HCC)    . Dysphagia    . Bacteremia    . Benign essential HTN    . Chronic neck pain    . Sepsis (HCC)    . Acute blood loss anemia    . Tachypnea    . Leukocytosis    . Fever    . Cardiogenic shock (HCC)    . Aneurysm of left ventricle of heart 04/12/2017  . Acute anterior wall MI (HCC)    . Acute pulmonary edema (HCC)    . Acute hypoxemic respiratory failure (HCC)    . ACS (acute coronary syndrome) (HCC)    . Acute heart failure (HCC)    . Encounter for  central line placement    . Encounter for management of intra-aortic balloon pump    . STEMI (ST elevation myocardial infarction) (HCC) 04/11/2017  . COPD (chronic obstructive pulmonary disease) (HCC) 04/11/2017  . CHF (congestive heart failure), NYHA class II, acute, combined (HCC) 04/11/2017      Expected Discharge Date: Expected Discharge Date: 05/13/17   Team Members Present: Physician leading conference: Dr. Maryla Morrow Social Worker Present: Amada Jupiter, [name] Nurse Present: Kennon Portela, RN PT Present: Judieth Keens, PT OT Present: Johnsie Cancel, OT SLP Present: Jackalyn Lombard, SLP PPS Coordinator present : Tora Duck, RN, CRRN       Current Status/Progress Goal Weekly Team Focus  Medical     Debility secondary to coronary artery disease and left ventricular rupture with acute systolic heart failure  Improve mobility, endurance, cognition, BP, respiratory, dysphagia  See above   Bowel/Bladder     Contient of Bladder, Incontient of bowel LBM 05/03/2017  Maintain continence of bladder.bowel  Assess toileting needs QS and prn, toilet Q 2 hrs,   Swallow/Nutrition/ Hydration     Dys. 1 textures with pudding thick liquids, Min A  Supervision  trials of upgraded liquids, repeat MBS   ADL's     sit to stand fluctuates  from min to mod A depending on surface and time of day, min A UB dressing, mod A LB dressing, min A bathing, mod A toileting  supervision overall  activity tolerance, strengthening, balance, ADL training, pt/family education   Mobility     min A transfers and short distance gait  supervision overall  activity tolerance, strengthening, balance   Communication               Safety/Cognition/ Behavioral Observations   Mod-Max A  Min A  attention, problem solving, awareness    Pain     Denies pain   < 3  Assess QS discomfort of pain and prn,    Skin        No   Assess QS and prn maintaim skin integrity     Rehab Goals Patient on target to meet rehab goals: No Rehab  Goals Revised: making limited progress due to ongoing cognitive impairments *See Care Plan and progress notes for long and short-term goals.      Barriers to Discharge   Current Status/Progress Possible Resolutions Date Resolved   Physician     Medical stability;Decreased caregiver support;Lack of/limited family support;Behavior     See above  Therapies, follow labs, wean supplemental O2 as necessary, follow BP      Nursing                 PT                    OT                 SLP            SW              Discharge Planning/Teaching Needs:  Plan is for pt to d/c home with sister who can provide supervision, however, concern with current cognitive impairments which do not appear to be improving.  TBD   Team Discussion:  Still using supplemental O2, however, not clear if this is truly needed or more anxiety - based need.  Monitor BP and BSs.  Not meeting tx target goals at this point.  Very poor participation;  Plan MBS tomorrow.  Functionally at a supervision- min assist level, however, cognition is primary concern in terms of management in the home setting.  Revisions to Treatment Plan:  Not yet     Continued Need for Acute Rehabilitation Level of Care: The patient requires daily medical management by a physician with specialized training in physical medicine and rehabilitation for the following conditions: Daily direction of a multidisciplinary physical rehabilitation program to ensure safe treatment while eliciting the highest outcome that is of practical value to the patient.: Yes Daily medical management of patient stability for increased activity during participation in an intensive rehabilitation regime.: Yes Daily analysis of laboratory values and/or radiology reports with any subsequent need for medication adjustment of medical intervention for : Cardiac problems;Pulmonary problems;Blood pressure problems   Joevanni Roddey 05/04/2017, 11:23 AM

## 2017-05-11 NOTE — Progress Notes (Signed)
Speech Language Pathology Daily Session Note  Patient Details  Name: Ronald HoveGary [name] MRN: [id number] Date of Birth: 06-Dec-1950  Today's Date: 05/11/2017 SLP Individual Time: 0805-0900 SLP Individual Time Calculation (min): 55 min  Short Term Goals: Week 2: SLP Short Term Goal 1 (Week 2): Pt will consume therapeutic trials of ice chips with minimal overt s/s of aspiration over 3 consecutive sessions prior to repeat objective assessment.   SLP Short Term Goal 2 (Week 2): Pt will sustain his attention to functional tasks for 10 minutes with min verbal cues for redirection.   SLP Short Term Goal 3 (Week 2): Pt will maintain a topic of conversation for at least 5 turns with supervision verbal cues.    SLP Short Term Goal 4 (Week 2): Pt will complete basic, functional tasks with supervision verbal cues for problem solving.   SLP Short Term Goal 5 (Week 2): Pt will consume dys 2 textures and honey thick liquids with supervision cues for use of swallowing precautions.   SLP Short Term Goal 6 (Week 2): Pt will return demonstration of at least 2 safety precautions with min assist verbal cues.    Skilled Therapeutic Interventions:  Pt was seen for skilled ST targeting cognitive and dysphagia goals.  Pt was eating breakfast upon therapist's arrival without overt s/s of aspiration and mod I use of swallowing precautions.  PO intake remains limited and pt needed encouragement to eat and drink.  Pt also needed re-explanation for rationale of currently prescribed diet.  He continues to ask why the doctors can't "just do surgery to close that up."  Upon completion of meal, therapist facilitated the session with ongoing structured practice for medication management.  Pt was able to load pills into a pill box with supervision for 100% accuracy following initial instruction.  Pt was returned to room and left in bed with bed alarm set and call bell within reach.  Continue per current plan of care.     Function:  Eating Eating   Modified Consistency Diet: Yes Eating Assist Level: More than reasonable amount of time           Cognition Comprehension Comprehension assist level: Follows basic conversation/direction with extra time/assistive device  Expression   Expression assist level: Expresses basic needs/ideas: With extra time/assistive device  Social Interaction Social Interaction assist level: Interacts appropriately 75 - 89% of the time - Needs redirection for appropriate language or to initiate interaction.  Problem Solving Problem solving assist level: Solves basic 90% of the time/requires cueing < 10% of the time  Memory Memory assist level: Recognizes or recalls 50 - 74% of the time/requires cueing 25 - 49% of the time    Pain Pain Assessment Pain Assessment: No/denies pain  Therapy/Group: Individual Therapy  Jerrett Baldinger, Melanee SpryNicole L 05/11/2017, 11:15 AM

## 2017-05-12 ENCOUNTER — Encounter (HOSPITAL_COMMUNITY): Payer: Medicare Other | Admitting: Speech Pathology

## 2017-05-12 ENCOUNTER — Ambulatory Visit (HOSPITAL_COMMUNITY): Payer: Medicare Other | Admitting: Physical Therapy

## 2017-05-12 ENCOUNTER — Inpatient Hospital Stay (HOSPITAL_COMMUNITY): Payer: Medicare Other | Admitting: Speech Pathology

## 2017-05-12 ENCOUNTER — Encounter (HOSPITAL_COMMUNITY): Payer: Medicare Other | Admitting: Occupational Therapy

## 2017-05-12 DIAGNOSIS — J029 Acute pharyngitis, unspecified: Secondary | ICD-10-CM | POA: Insufficient documentation

## 2017-05-12 LAB — CBC WITH DIFFERENTIAL/PLATELET
BASOS ABS: 0 10*3/uL (ref 0.0–0.1)
BASOS PCT: 0 %
EOS ABS: 0.5 10*3/uL (ref 0.0–0.7)
Eosinophils Relative: 5 %
HEMATOCRIT: 32.1 % — AB (ref 39.0–52.0)
HEMOGLOBIN: 9.7 g/dL — AB (ref 13.0–17.0)
Lymphocytes Relative: 13 %
Lymphs Abs: 1.3 10*3/uL (ref 0.7–4.0)
MCH: 27.4 pg (ref 26.0–34.0)
MCHC: 30.2 g/dL (ref 30.0–36.0)
MCV: 90.7 fL (ref 78.0–100.0)
Monocytes Absolute: 0.9 10*3/uL (ref 0.1–1.0)
Monocytes Relative: 9 %
NEUTROS ABS: 7.4 10*3/uL (ref 1.7–7.7)
NEUTROS PCT: 73 %
Platelets: 239 10*3/uL (ref 150–400)
RBC: 3.54 MIL/uL — ABNORMAL LOW (ref 4.22–5.81)
RDW: 17.6 % — AB (ref 11.5–15.5)
WBC: 10.1 10*3/uL (ref 4.0–10.5)

## 2017-05-12 LAB — BASIC METABOLIC PANEL
ANION GAP: 5 (ref 5–15)
BUN: 10 mg/dL (ref 6–20)
CALCIUM: 8.6 mg/dL — AB (ref 8.9–10.3)
CO2: 31 mmol/L (ref 22–32)
CREATININE: 0.82 mg/dL (ref 0.61–1.24)
Chloride: 105 mmol/L (ref 101–111)
Glucose, Bld: 99 mg/dL (ref 65–99)
Potassium: 4 mmol/L (ref 3.5–5.1)
SODIUM: 141 mmol/L (ref 135–145)

## 2017-05-12 MED ORDER — PREDNISONE 5 MG PO TABS: 10.0000 mg | ORAL_TABLET | Freq: Every day | ORAL | Status: DC

## 2017-05-12 MED ORDER — PHENOL 1.4 % MT LIQD
1.0000 | OROMUCOSAL | Status: DC | PRN
  Filled 2017-05-12: qty 177

## 2017-05-12 MED ORDER — PREDNISONE 20 MG PO TABS: 20.0000 mg | ORAL_TABLET | Freq: Every day | ORAL | Status: DC

## 2017-05-12 MED ORDER — ATORVASTATIN CALCIUM 80 MG PO TABS: 80.0000 mg | ORAL_TABLET | Freq: Every day | ORAL | 0 refills | Status: DC

## 2017-05-12 MED ORDER — PREDNISONE 5 MG (21) PO TBPK: 10.0000 mg | ORAL_TABLET | Freq: Every morning | ORAL | Status: DC

## 2017-05-12 MED ORDER — PREDNISONE 5 MG (21) PO TBPK
5.0000 mg | ORAL_TABLET | Freq: Three times a day (TID) | ORAL | Status: DC
Start: 2017-05-13 — End: 2017-05-12

## 2017-05-12 MED ORDER — PREDNISONE 20 MG PO TABS
40.0000 mg | ORAL_TABLET | Freq: Every day | ORAL | Status: AC
  Administered 2017-05-13: 40 mg via ORAL
  Filled 2017-05-12: qty 2

## 2017-05-12 MED ORDER — RESOURCE THICKENUP CLEAR PO POWD: ORAL | 2 refills | Status: DC

## 2017-05-12 MED ORDER — PREDNISONE 5 MG (21) PO TBPK
5.0000 mg | ORAL_TABLET | ORAL | Status: DC
Start: 2017-05-12 — End: 2017-05-12

## 2017-05-12 MED ORDER — PREDNISONE 10 MG PO TABS: ORAL_TABLET | ORAL | 0 refills | Status: DC

## 2017-05-12 MED ORDER — LOSARTAN POTASSIUM 25 MG PO TABS: 12.5000 mg | ORAL_TABLET | Freq: Every day | ORAL | 0 refills | Status: DC

## 2017-05-12 MED ORDER — PREDNISONE 5 MG (21) PO TBPK
10.0000 mg | ORAL_TABLET | Freq: Every evening | ORAL | Status: DC
Start: 2017-05-13 — End: 2017-05-12

## 2017-05-12 MED ORDER — TRIAMCINOLONE ACETONIDE 0.5 % EX CREA
TOPICAL_CREAM | Freq: Four times a day (QID) | CUTANEOUS | 2 refills | Status: DC
Start: 2017-05-12 — End: 2017-05-12

## 2017-05-12 MED ORDER — DIPHENHYDRAMINE HCL 25 MG PO CAPS: 25.0000 mg | ORAL_CAPSULE | Freq: Four times a day (QID) | ORAL | Status: DC | PRN

## 2017-05-12 MED ORDER — PREDNISONE 5 MG PO TABS: 50.0000 mg | ORAL_TABLET | Freq: Every day | ORAL | Status: DC

## 2017-05-12 MED ORDER — PREDNISONE 5 MG PO TABS: 30.0000 mg | ORAL_TABLET | Freq: Every day | ORAL | Status: DC

## 2017-05-12 MED ORDER — PREDNISONE 5 MG PO TABS
50.0000 mg | ORAL_TABLET | Freq: Once | ORAL | Status: AC
  Administered 2017-05-12: 50 mg via ORAL
  Filled 2017-05-12: qty 2

## 2017-05-12 MED ORDER — PREDNISONE 5 MG (21) PO TBPK: 5.0000 mg | ORAL_TABLET | Freq: Four times a day (QID) | ORAL | Status: DC

## 2017-05-12 MED ORDER — SENNA 8.6 MG PO TABS: 1.0000 | ORAL_TABLET | Freq: Every day | ORAL | 0 refills | Status: DC

## 2017-05-12 MED ORDER — PREDNISONE 5 MG (21) PO TBPK: 5.0000 mg | ORAL_TABLET | ORAL | Status: DC

## 2017-05-12 MED ORDER — PANTOPRAZOLE SODIUM 40 MG PO TBEC: 40.0000 mg | DELAYED_RELEASE_TABLET | Freq: Every day | ORAL | 0 refills | Status: DC

## 2017-05-12 MED ORDER — TICAGRELOR 90 MG PO TABS: 90.0000 mg | ORAL_TABLET | Freq: Two times a day (BID) | ORAL | 0 refills | Status: DC

## 2017-05-12 MED ORDER — AMIODARONE HCL 200 MG PO TABS: 200.0000 mg | ORAL_TABLET | Freq: Two times a day (BID) | ORAL | 0 refills | Status: DC

## 2017-05-12 MED ORDER — CARVEDILOL 3.125 MG PO TABS: 3.1250 mg | ORAL_TABLET | Freq: Two times a day (BID) | ORAL | 0 refills | Status: DC

## 2017-05-12 MED ORDER — TRIAMCINOLONE ACETONIDE 0.5 % EX CREA: TOPICAL_CREAM | Freq: Four times a day (QID) | CUTANEOUS | 2 refills | Status: DC

## 2017-05-12 MED ORDER — PREDNISONE 5 MG (21) PO TBPK: 10.0000 mg | ORAL_TABLET | Freq: Every evening | ORAL | Status: DC

## 2017-05-12 MED ORDER — PREDNISONE 20 MG PO TABS: 40.0000 mg | ORAL_TABLET | Freq: Every day | ORAL | Status: DC

## 2017-05-12 NOTE — Discharge Instructions (Signed)
Inpatient Rehab Discharge Instructions  Ronald HoveGary [name] Discharge date and time:  05/13/17  Activities/Precautions/ Functional Status: Activity: Continue Sternal precautions--Limit  lifting/pushing/pulling to 10 lbs. No driving, or strenuous exercise  till cleared by MD Diet: Heart healthy diet--low salt. Need to chop up foods and thicken all liquids to honey consistency. No ice cream or jello.  Wound Care: keep wound clean and dry Contact MD if you develop any problems with your incision/wound--redness, swelling, increase in pain, drainage or if you develop fever or chills.   Functional status:  ___ No restrictions     ___ Walk up steps independently _X__ 24/7 supervision/assistance   ___ Walk up steps with assistance ___ Intermittent supervision/assistance  ___ Bathe/dress independently ___ Walk with walker     _X__ Bathe/dress with assistance ___ Walk Independently    ___ Shower independently ___ Walk with assistance    ___ Shower with assistance _XZ__ No alcohol     ___ Return to work/school ________   COMMUNITY REFERRALS UPON DISCHARGE:    Home Health:   PT     OT     ST                      Agency:  [facility] Phone: [phone]   Medical Equipment/Items Ordered:  Wheelchair, cushion, walker and tub bench                                                      Agency/Supplier:  [facility] @ [phone]     Special Instructions: 1. Maintain sternal precautions--no pushing, pulling or lifting items over 5 lbs. Can put 10 lbs weight thorough your arms.   2.  Need to follow up with your primary care MD in a week or sooner if rash gets worse.  3. Can do water protocol between meals --need to wait for minutes after eating. Brush your teeth and then you can drink water.  4. Needs supervision with meals and assistance to manage medications.     My questions have been answered and I understand these instructions. I will adhere to these goals and the provided educational  materials after my discharge from the hospital.  Patient/Caregiver Signature _______________________________ Date __________  Clinician Signature _______________________________________ Date __________  Please bring this form and your medication list with you to all your follow-up doctor's appointments.

## 2017-05-12 NOTE — Progress Notes (Signed)
Occupational Therapy Discharge Summary  Patient Details  Name: [name] MRN: [id number] Date of Birth: 09/01/1950  Today's Date: 05/12/2017 OT Individual Time: 1500-1530 OT Individual Time Calculation (min): 30 min        Occupational Therapy Discharge Summary  Patient Details  Name: [name] MRN: [id number] Date of Birth: [birth year]  Patient has met 12 of 12 long term goals due to improved activity tolerance, improved balance, ability to compensate for deficits, improved attention and improved awareness.  Patient to discharge at overall Supervision level.  Patient's care partner is independent to provide the necessary physical and cognitive assistance at discharge.    Reasons goals not met: n/a  Recommendation:  Patient will benefit from ongoing skilled OT services in home health setting to continue to advance functional skills in the area of BADL.         Equipment: TTB  Reasons for discharge: treatment goals met  Patient/family agrees with progress made and goals achieved: Yes  OT Discharge Precautions/Restrictions  Precautions Precautions: Sternal;Fall Restrictions Weight Bearing Restrictions: No Other Position/Activity Restrictions: Sternal precautions    Vital Signs Therapy Vitals Temp: 98.3 F (36.8 C) Temp Source: Tympanic Pulse Rate: 73 Resp: 18 BP: 128/60 Patient Position (if appropriate): Lying Oxygen Therapy SpO2: 95 % O2 Device: Not Delivered Pain Pain Assessment Pain Assessment: Faces Faces Pain Scale: No hurt ADL ADL ADL Comments: Refer to functional navigator  Vision   Perception  Perception: Within Functional Limits Praxis Praxis: Intact Cognition Overall Cognitive Status: Impaired/Different from baseline Arousal/Alertness: Awake/alert Orientation Level: Oriented X4 Attention: Selective Selective Attention: Impaired Selective Attention Impairment: Functional basic;Verbal basic Memory: Impaired Memory  Impairment: Decreased recall of new information Problem Solving: Impaired Problem Solving Impairment: Verbal basic;Functional basic Behaviors: Poor frustration tolerance;Perseveration Safety/Judgment: Appears intact Sensation Sensation Light Touch: Appears Intact Proprioception: Appears Intact Coordination Gross Motor Movements are Fluid and Coordinated: Yes Fine Motor Movements are Fluid and Coordinated: Yes Motor  Motor Motor - Discharge Observations: generalized weakness Mobility  Bed Mobility Bed Mobility: Supine to Sit;Sit to Supine Supine to Sit: 5: Supervision Sit to Supine: 5: Supervision Transfers Transfers: Sit to Stand;Stand to Sit Sit to Stand: 5: Supervision Stand to Sit: 5: Supervision  Trunk/Postural Assessment  Cervical Assessment Cervical Assessment: Within Functional Limits Thoracic Assessment Thoracic Assessment: (sternal precautions ) Lumbar Assessment Lumbar Assessment: Within Functional Limits Postural Control Righting Reactions: delayed  Balance Balance Balance Assessed: Yes Static Sitting Balance Static Sitting - Level of Assistance: 7: Independent Dynamic Sitting Balance Dynamic Sitting - Level of Assistance: 5: Stand by assistance Static Standing Balance Static Standing - Level of Assistance: 5: Stand by assistance Dynamic Standing Balance Dynamic Standing - Level of Assistance: 5: Stand by assistance Extremity/Trunk Assessment RUE Assessment RUE Assessment: Within Functional Limits(sternal precautions ) LUE Assessment LUE Assessment: Within Functional Limits(sternal precautions)   See Function Navigator for Current Functional Status.  Raymondo Band 05/12/2017, 4:57 PM

## 2017-05-12 NOTE — Progress Notes (Signed)
Physical Therapy Discharge Summary  Patient Details  Name: [name] MRN: [id number] Date of Birth: [birth year]  Today's Date: 05/12/2017 PT Individual Time: 1415-1500 PT Individual Time Calculation (min): 45 min   Pt requires max encouragement to participate in PT session.  Pt eventually willing ot perform gait in room with RW with supervision.  Toilet transfers with supervision. hygiene and clothing management with supervision with use of adaptive equipment.  Pt able to perform bed mobility rolling and supine <> sit with supervision.  Patient has met 6 of 7 long term goals due to improved activity tolerance, improved balance, increased strength and ability to compensate for deficits.  Patient to discharge at an ambulatory level Supervision.   Patient's care partner did not show for family education and has not performed education regarding pt's mobility.  Reasons goals not met: pt unwilling to attempt stair negotiation  Recommendation:  Patient will benefit from ongoing skilled PT services in home health setting to continue to advance safe functional mobility, address ongoing impairments in activity tolerance, strength, and minimize fall risk.  Equipment: RW, w/c  Reasons for discharge: treatment goals met and discharge from hospital  Patient/family agrees with progress made and goals achieved: Yes  PT Discharge Precautions/Restrictions Precautions Precautions: Sternal;Fall Restrictions Other Position/Activity Restrictions: Sternal precautions Pain Pain Assessment Pain Assessment: No/denies pain  Cognition Overall Cognitive Status: Impaired/Different from baseline Arousal/Alertness: Awake/alert Memory: Impaired Behaviors: Poor frustration tolerance;Perseveration Safety/Judgment: Appears intact Sensation Sensation Light Touch: Appears Intact Proprioception: Appears Intact Coordination Gross Motor Movements are Fluid and Coordinated: Yes Fine Motor Movements are Fluid  and Coordinated: Yes Motor  Motor Motor - Discharge Observations: generalized weakness   Trunk/Postural Assessment  Cervical Assessment Cervical Assessment: Within Functional Limits Thoracic Assessment Thoracic Assessment: (sternal precautions) Lumbar Assessment Lumbar Assessment: Within Functional Limits Postural Control Righting Reactions: delayed  Balance Static Standing Balance Static Standing - Level of Assistance: 5: Stand by assistance Dynamic Standing Balance Dynamic Standing - Level of Assistance: 5: Stand by assistance Extremity Assessment      RLE Assessment RLE Assessment: (grossly 3+/5) LLE Assessment LLE Assessment: (grossly 3+/5)   See Function Navigator for Current Functional Status.  [name] 05/12/2017, 3:04 PM

## 2017-05-12 NOTE — Progress Notes (Signed)
Speech Language Pathology Discharge Summary  Patient Details  Name: [name] MRN: [id number] Date of Birth: 28-Nov-1950  Today's Date: 05/12/2017 SLP Individual Time: 1005-1107; [id number]   Skilled Therapeutic Interventions:  Session 1:  Pt was seen for skilled ST targeting cognitive and dysphagia goals.  Pt needed increased time and encouragement to get out of bed for therapies; however, once up pt was overall supervision for safety and problem solving when ambulating to and from toilet, while completing hygiene of his peri-area and in donning clean clothes.  Pt also completed medication management task from yesterday's therapy session for 100% accuracy with mod I.  Pt was very thorough when completing tasks and demonstrated good strategies to monitor and correct errors.  Pt could recall parameters of the water protocol with supervision cues and consumed therapeutic trials of thin liquids with immediate coughing in 1 out of 3 trials.  Pt stated that he felt liquids "went down the wrong pipe," which is an improvement in comparison to most recent MBS where aspiration and penetration events were silent in nature.  Pt was returned to room and left in bed with bed alarm set and call bell within reach.   Session 2:   Pt was seen for skilled ST targeting education prior to discharge.  Pt's sister was scheduled to attend training but was not present at the time of today's session.  CSW made aware.  SLP provided skilled education to pt regarding dys 2 textures and thickened liquids with a handout to maximize carryover of diet recommendations in the home environment.  SLP demonstrated how to thicken liquids to the appropriate viscosity and pt was able to demonstrate understanding of procedures via teach back.  SLP instructed pt on where to purchase thickener upon discharge and recommendations for ongoing ST follow up at next level of care to continue to address swallowing goals.  Pt was also able to recall  parameters of the water protocol with supervision question cues.  Despite being able to demonstrate at least a cursory understanding of his swallowing precautions, pt did appear overwhelmed by information and SLP strongly recommends ongoing education at next level of care.  Pt was handed off to PT.    Patient has met 6 of 6 long term goals.  Patient to discharge at overall Supervision level.  Reasons goals not met:  n/a   Clinical Impression/Discharge Summary:  Pt has made functional gains and is discharging having met 6 out of 6 long term goals.  Pt is now consuming a dys 2 diet and honey thick liquids with mod I use of swallowing precautions (volitional throat clear/cough followed by second swallow).  Pt has poor endurance and as a result needs increased time for task initiation and multiple rest breaks during activity; however, mentation has cleared significantly and pt is currently supervision level for semi-complex tasks once accommodations are made for his abovementioned limitations. Pt is also limited at times by anxiety and depression regarding his loss of function; however, he is becoming much more easily redirectable with encouragement.  Pt is discharging home with 24/7 supervision from his sister.  Would recommend ongoing education at next level of care as pt's sister did not attend scheduled training while on CIR.  Recommending ST follow up at next level of care to address cognition and dysphagia.    Care Partner:  Caregiver Able to Provide Assistance: Other (comment)(per report, sister is to provide 24/7 supervision, not present to verify)  Type of Caregiver Assistance: Cognitive;Physical  Recommendation:  24 hour supervision/assistance;Home Health SLP;Outpatient SLP  Rationale for SLP Follow Up: Reduce caregiver burden;Maximize cognitive function and independence;Maximize swallowing safety   Equipment: thickener   Reasons for discharge: Discharged from hospital   Patient/Family  Agrees with Progress Made and Goals Achieved: Yes   Function:  Eating Eating   Modified Consistency Diet: Yes Eating Assist Level: More than reasonable amount of time           Cognition Comprehension Comprehension assist level: Follows complex conversation/direction with extra time/assistive device  Expression   Expression assist level: Expresses complex ideas: With extra time/assistive device  Social Interaction Social Interaction assist level: Interacts appropriately 90% of the time - Needs monitoring or encouragement for participation or interaction.  Problem Solving Problem solving assist level: Solves complex 90% of the time/cues < 10% of the time  Memory Memory assist level: Recognizes or recalls 90% of the time/requires cueing < 10% of the time   Emilio Math 05/12/2017, 3:38 PM

## 2017-05-12 NOTE — Progress Notes (Signed)
Social Work  Discharge Note  The overall goal for the admission was met for:   Discharge location: Yes - discharge home with sister, Suzi Roots of Stay: Yes - 16 days (with discharge on 12/16)  Discharge activity level: Yes - supervision  Home/community participation: Yes  Services provided included: MD, RD, PT, OT, SLP, RN, TR, Pharmacy, [place]: Medicare and Medicaid  Follow-up services arranged: Home Health: PT, OT, ST via [place], DME: 18x18 lighweight w/c, cushion, rolling walker, tub bench via [place] and Patient/Family has no preference for HH/DME agencies  Comments (or additional information):  Patient/Family verbalized understanding of follow-up arrangements: Yes  Individual responsible for coordination of the follow-up plan: pt/ sister  Confirmed correct DME delivered: Lennart Pall 05/12/2017    Briyah Wheelwright

## 2017-05-12 NOTE — Progress Notes (Signed)
    Rash on abdomen is a little better but rash on legs unchanged. Patient denies any increase in itching.  Discussed with Dr. Allena KatzPatel and will start patient on short course steroids. Advised him to follow up with primary MD if rash does not improve. His sister did not show up for family education and he is unsure if she will be here tomorrow for d/c. Advised him to have her come in so medications and restrictions can be reviewed with her prior to discharge.

## 2017-05-12 NOTE — Progress Notes (Signed)
Occupational Therapy Session Note  Patient Details  Name: [name] MRN: [id number] Date of Birth: [birth year]  Today's Date: 05/12/2017 OT Individual Time: 1500-1530 OT Individual Time Calculation (min): 30 min    Short Term Goals: Week 2:  OT Short Term Goal 1 (Week 2): STGs = LTGs  Skilled Therapeutic Interventions/Progress Updates:    Pt received supine in bed, requiring max encouragement to participate in OT session. Pt's sister not present for planned family education. With encouragement Pt completes bed mobility, functional mobility within room, and toilet transfer at RW level at supervision level, with intermittent verbal cues for hand placement. Pt demonstrates use of reacher to thread LEs into additional pantlegs as Pt declining demonstrating with pants he was currently wearing.  PA present during part of session, with Pt completing clothing management to advance pants/underwear over hips for examination of rash on LEs, completing task with supervision. Pt declining further mobility, so verbally reviewed transfer technique to TTB with Pt verbalizing understanding. Pt left supine in bed, bed alarm activated, call bell and needs within reach.   Therapy Documentation Precautions:  Precautions Precautions: Sternal, Fall Restrictions Weight Bearing Restrictions: No Other Position/Activity Restrictions: Sternal precautions   Pain: Pain Assessment Pain Assessment: Faces Faces Pain Scale: No hurt ADL: ADL ADL Comments: Refer to functional navigator  See Function Navigator for Current Functional Status.   Therapy/Group: Individual Therapy  Orlando PennerBreanna L Jovani Flury 05/12/2017, 4:46 PM

## 2017-05-12 NOTE — Progress Notes (Signed)
Nutrition Follow-up  DOCUMENTATION CODES:   Obesity unspecified  INTERVENTION:  Continue 30 ml Prostat po BID, each supplement provides 100 kcal and 15 grams of protein.   Provide Magic cup TID with meals, each supplement provides 290 kcal and 9 grams of protein.  Encourage adequate PO intake.   NUTRITION DIAGNOSIS:   Inadequate oral intake related to dysphagia as evidenced by meal completion < 50%; ongoing  GOAL:   Patient will meet greater than or equal to 90% of their needs; met  MONITOR:   PO intake, Supplement acceptance, Diet advancement, Labs, Weight trends, I & O's, Skin  REASON FOR ASSESSMENT:   Consult Calorie Count, Enteral/tube feeding initiation and management  ASSESSMENT:    66 y.o. male with history of CAD, HTN, COPD, chronic neck pain;  who was admitted on 04/11/17 with SOB and malaise due to inferior lateral STEMI, lethargy and cardiogenic shock. He was taken to OR for CABG with resection of LV aneurysm with placement of impella 11/14 and underwent chest closure on 11/17.  11/19 Impella removed  NGT removed 12/9. Pt continues on a dysphagia 2 diet with honey thick liquids. Most meal completion has been 100%. Pt currently has Prostat ordered and has been consuming them. RD to continue with current orders to aid in adequate nutrition.    Labs and medications reviewed.  Diet Order:  DIET DYS 2 Room service appropriate? Yes; Fluid consistency: Honey Thick  EDUCATION NEEDS:   Not appropriate for education at this time  Skin:  Skin Assessment: Reviewed RN Assessment  Last BM:  12/13  Height:   Ht Readings from Last 1 Encounters:  05/01/17 5' 7"  (1.702 m)    Weight:   Wt Readings from Last 1 Encounters:  05/12/17 188 lb 11.4 oz (85.6 kg)    Ideal Body Weight:  67.27 kg  BMI:  Body mass index is 29.56 kg/m.  Estimated Nutritional Needs:   Kcal:  2000-2200  Protein:  100-115 grams  Fluid:  >/= 2 L/day    Corrin Parker, MS, RD,  LDN Pager # [phone] After hours/ weekend pager # [phone]

## 2017-05-12 NOTE — Progress Notes (Signed)
[facility]     PROGRESS NOTE   Subjective/Complaints: Patient seen sitting up in his chair this morning. He states he slept well overnight. He complains of a sore throat this morning and would like a spray.  ROS: Denies CP, SOB, N/V/D.  Objective: Vital Signs: Blood pressure (!) 124/55, pulse 74, temperature 98.1 F (36.7 C), temperature source Oral, resp. rate 18, height 5\' 7"  (1.702 m), weight 85.6 kg (188 lb 11.4 oz), SpO2 97 %. No results found. Recent Labs    05/10/17 0622 05/12/17 0502  WBC 7.7 10.1  HGB 9.7* 9.7*  HCT 32.3* 32.1*  PLT 275 239   Recent Labs    05/10/17 0622 05/12/17 0502  NA 138 141  K 3.9 4.0  CL 100* 105  GLUCOSE 81 99  BUN 10 10  CREATININE 0.75 0.82  CALCIUM 8.3* 8.6*   CBG (last 3)  No results for input(s): GLUCAP in the last 72 hours.  Wt Readings from Last 3 Encounters:  05/12/17 85.6 kg (188 lb 11.4 oz)  04/27/17 88.3 kg (194 lb 10.7 oz)    Physical Exam:  Constitutional: He appearswell-developedand well-nourished.No distress. HENT: Normocephalicand atraumatic.  Eyes:EOMare normal. No discharge.  Cardiovascular:  Normal rate. +murmur. No JVD Respiratory:Lungs clear. Unlabored. GI: Bowel sounds are normal. He exhibitsno distension.  Musculoskeletal: He exhibits no edema, no tenderness. Neurological: He isalert. He can follow simple commands.   Motor: B/l UE 4-/5 proximal to distal  B/l LE: HF 4/5, KE 4/5, ADF/PF 4/5 (continues to improve) Skin iswarmand dry. He isnot diaphoretic.Sternal incision is intact.   Psychiatric: Pleasant. Tangential  Assessment/Plan: 1.  Functional deficits and decreased mobility secondary to debility after multiple medical complications which require 3+ hours per day of interdisciplinary therapy in a comprehensive inpatient rehab setting. Physiatrist is providing close team supervision and 24 hour management of active medical problems listed  below. Physiatrist and rehab team continue to assess barriers to discharge/monitor patient progress toward functional and medical goals.  Function:  Bathing Bathing position Bathing activity did not occur: N/A Position: Shower  Bathing parts Body parts bathed by patient: Right upper leg, Front perineal area, Left upper leg, Right arm, Left arm, Chest, Abdomen, Buttocks(pt runs soapy water over his feet) Body parts bathed by helper: Right lower leg, Left lower leg, Buttocks, Back  Bathing assist        Upper Body Dressing/Undressing Upper body dressing   What is the patient wearing?: Button up shirt     Pull over shirt/dress - Perfomed by patient: Thread/unthread right sleeve, Thread/unthread left sleeve, Pull shirt over trunk, Put head through opening Pull over shirt/dress - Perfomed by helper: Put head through opening Button up shirt - Perfomed by patient: Thread/unthread right sleeve, Thread/unthread left sleeve, Pull shirt around back, Button/unbutton shirt      Upper body assist Assist Level: Supervision or verbal cues      Lower Body Dressing/Undressing Lower body dressing   What is the patient wearing?: Pants, Non-skid slipper socks, Underwear Underwear - Performed by patient: Thread/unthread right underwear leg, Thread/unthread left underwear leg, Pull underwear up/down   Pants- Performed by patient: Thread/unthread left pants leg, Pull pants up/down, Thread/unthread right pants leg Pants- Performed by helper: Thread/unthread right pants leg   Non-skid slipper socks- Performed by helper: Don/doff right sock, Don/doff left sock   Socks - Performed by helper: Don/doff right sock, Don/doff left sock(sock aid too tight)   Shoes - Performed by helper: Don/doff right  shoe, Don/doff left shoe          Lower body assist Assist for lower body dressing: Supervision or verbal cues      Toileting Toileting Toileting activity did not occur: Safety/medical concerns Toileting  steps completed by patient: Adjust clothing prior to toileting, Performs perineal hygiene, Adjust clothing after toileting Toileting steps completed by helper: Performs perineal hygiene Toileting Assistive Devices: Grab bar or rail  Toileting assist Assist level: Set up/obtain supplies   Transfers Chair/bed transfer Chair/bed transfer activity did not occur: N/A Chair/bed transfer method: Stand pivot Chair/bed transfer assist level: Supervision or verbal cues Chair/bed transfer assistive device: Patent attorney Ambulation activity did not occur: N/A   Max distance: 8 Assist level: Touching or steadying assistance (Pt > 75%)   Wheelchair Wheelchair activity did not occur: N/A Type: Manual Max wheelchair distance: 67' Assist Level: Touching or steadying assistance (Pt > 75%)  Cognition Comprehension Comprehension assist level: Follows complex conversation/direction with extra time/assistive device  Expression Expression assist level: Expresses complex ideas: With extra time/assistive device  Social Interaction Social Interaction assist level: Interacts appropriately 90% of the time - Needs monitoring or encouragement for participation or interaction.  Problem Solving Problem solving assist level: Solves complex 90% of the time/cues < 10% of the time  Memory Memory assist level: Recognizes or recalls 90% of the time/requires cueing < 10% of the time   Medical Problem List and Plan: 1.   Debility secondary to coronary artery disease and left ventricular rupture with acute systolic heart failure  Cont CIR, plan for discharge tomorrow  Will see patient for transitional care management in 1-2 weeks 2. DVT Prophylaxis/Anticoagulation: Pharmaceutical:Lovenox 3. Pain Management: Use acetaminophen as needed 4. Mood:[name] to follow for evaluation and support.   Requires significant amount of motivation 5. Neuropsych: This patientis not fullycapable of making decisions  on hisown behalf. 6. Skin/Wound Care:Monitor surgical wounds daily for healing . 7. Fluids/Electrolytes/Nutrition:Monitor I/O.   Advanced to D2 honey on 12/6, water protocol initiated on 12/14  IVF qhs DC'd on 12/12, will monitor for adequate by mouth intake   Magnesium within normal limits on 12/6  BMP within acceptable limits on 12/14 8. ESBL E coli (penile meatus): Completed meropenem and Vancomycin  9. Hypokalemia: Resolved   Con to monitor 10.?Anoxic BI: Seroquel d/ced  Establish better sleep wake cycle 11. Leukocytosis: Resolved   Continue to monitor for trends   Afebrile   Cont to monitor 12. WUJ:[id number] 200 mg bid and coreg addedon11/29 13. COPD with chronic hypoxic/hypercarbic failure:  Weaned supplemental O2   Encouraging IS 14. Acute systolic heart failure due to cardiogenic shock: EF improved to 35-40% on Echo on 12/10. No lasix. Monitor weight for signs of overload. Check weights daily.  Filed Weights   05/10/17 0544 05/11/17 0323 05/12/17 0500  Weight: 89 kg (196 lb 3.4 oz) 89.1 kg (196 lb 8.3 oz) 85.6 kg (188 lb 11.4 oz)   15. ABLA: Monitor H/H serially.    Hb  9.7 on 12/14  Cont to monitor 16. Hyperglycemia- resolved off TF, d/ced CBG    CBG (last 3)  No results for input(s): GLUCAP in the last 72 hours.  Cont to monitor 17. Essential HTN Vitals:   05/11/17 1829 05/12/17 0500  BP: 136/70 (!) 124/55  Pulse: 76 74  Resp:  18  Temp:  98.1 F (36.7 C)  SpO2:  97%   Decrease cozaar to 12.5 on 12/12   Controlled on 12/14  LOS (Days) 15 A FACE TO FACE EVALUATION WAS PERFORMED  Shadonna Benedick Karis JubaAnil Dezmin Kittelson, MD 05/12/2017 11:47 AM

## 2017-05-13 NOTE — Progress Notes (Signed)
[facility]     PROGRESS NOTE   Subjective/Complaints: No new issues.  Had questions about his amiodarone and whether he was going to go home with this.  Otherwise excited to go home  ROS: pt denies nausea, vomiting, diarrhea, cough, shortness of breath or chest pain   Objective: Vital Signs: Blood pressure (!) 142/72, pulse 74, temperature 97.8 F (36.6 C), temperature source Oral, resp. rate 18, height 5\' 7"  (1.702 m), weight 86.8 kg (191 lb 5.8 oz), SpO2 94 %. No results found. Recent Labs    05/12/17 0502  WBC 10.1  HGB 9.7*  HCT 32.1*  PLT 239   Recent Labs    05/12/17 0502  NA 141  K 4.0  CL 105  GLUCOSE 99  BUN 10  CREATININE 0.82  CALCIUM 8.6*   CBG (last 3)  No results for input(s): GLUCAP in the last 72 hours.  Wt Readings from Last 3 Encounters:  05/13/17 86.8 kg (191 lb 5.8 oz)  04/27/17 88.3 kg (194 lb 10.7 oz)    Physical Exam:  Constitutional: He appearswell-developedand well-nourished.No distress. HENT: Normocephalicand atraumatic.  Eyes:EOMare normal. No discharge.  Cardiovascular: Positive murmur no JVD Respiratory:Lungs clear. Unlabored. GI: Bowel sounds are normal. He exhibitsno distension.  Musculoskeletal: He exhibits no edema, no tenderness. Neurological: He isalert. He can follow simple commands.   Motor: B/l UE 4-/5 proximal to distal  B/l LE: HF 4/5, KE 4/5, ADF/PF 4/5 (continues to improve) Skin iswarmand dry. He isnot diaphoretic. Abdominal and sternal incisions are clean and intact Psychiatric: Cooperative but tangential  Assessment/Plan: 1.  Functional deficits and decreased mobility secondary to debility after multiple medical complications which require 3+ hours per day of interdisciplinary therapy in a comprehensive inpatient rehab setting. Physiatrist is providing close team supervision and 24 hour management of active medical problems listed below. Physiatrist and rehab team  continue to assess barriers to discharge/monitor patient progress toward functional and medical goals.  Function:  Bathing Bathing position Bathing activity did not occur: N/A Position: Shower  Bathing parts Body parts bathed by patient: Right upper leg, Front perineal area, Left upper leg, Right arm, Left arm, Chest, Abdomen, Buttocks(pt runs soapy water over his feet) Body parts bathed by helper: Right lower leg, Left lower leg, Buttocks, Back  Bathing assist        Upper Body Dressing/Undressing Upper body dressing   What is the patient wearing?: Button up shirt     Pull over shirt/dress - Perfomed by patient: Thread/unthread right sleeve, Thread/unthread left sleeve, Pull shirt over trunk, Put head through opening Pull over shirt/dress - Perfomed by helper: Put head through opening Button up shirt - Perfomed by patient: Thread/unthread right sleeve, Thread/unthread left sleeve, Pull shirt around back, Button/unbutton shirt      Upper body assist Assist Level: Supervision or verbal cues      Lower Body Dressing/Undressing Lower body dressing   What is the patient wearing?: Pants Underwear - Performed by patient: Thread/unthread right underwear leg, Thread/unthread left underwear leg, Pull underwear up/down   Pants- Performed by patient: Pull pants up/down Pants- Performed by helper: Thread/unthread right pants leg   Non-skid slipper socks- Performed by helper: Don/doff right sock, Don/doff left sock   Socks - Performed by helper: Don/doff right sock, Don/doff left sock(sock aid too tight)   Shoes - Performed by helper: Don/doff right shoe, Don/doff left shoe          Lower body assist Assist for lower  body dressing: Supervision or verbal cues      Toileting Toileting Toileting activity did not occur: Safety/medical concerns Toileting steps completed by patient: Adjust clothing prior to toileting, Performs perineal hygiene, Adjust clothing after toileting Toileting  steps completed by helper: Performs perineal hygiene Toileting Assistive Devices: Grab bar or rail  Toileting assist Assist level: Set up/obtain supplies   Transfers Chair/bed transfer Chair/bed transfer activity did not occur: N/A Chair/bed transfer method: Ambulatory Chair/bed transfer assist level: Supervision or verbal cues Chair/bed transfer assistive device: Patent attorneyWalker     Locomotion Ambulation Ambulation activity did not occur: N/A   Max distance: 25 Assist level: Supervision or verbal cues   Wheelchair Wheelchair activity did not occur: N/A Type: Manual Max wheelchair distance: 50' Assist Level: Touching or steadying assistance (Pt > 75%)  Cognition Comprehension Comprehension assist level: Follows complex conversation/direction with extra time/assistive device  Expression Expression assist level: Expresses complex ideas: With extra time/assistive device  Social Interaction Social Interaction assist level: Interacts appropriately 90% of the time - Needs monitoring or encouragement for participation or interaction.  Problem Solving Problem solving assist level: Solves complex 90% of the time/cues < 10% of the time  Memory Memory assist level: Recognizes or recalls 90% of the time/requires cueing < 10% of the time   Medical Problem List and Plan: 1.   Debility secondary to coronary artery disease and left ventricular rupture with acute systolic heart failure  Discharge home today.  Answered questions regarding some of his medications  Will see patient for transitional care management in 1-2 weeks 2. DVT Prophylaxis/Anticoagulation: Pharmaceutical:Lovenox 3. Pain Management: Use acetaminophen as needed 4. Mood:[name] to follow for evaluation and support.   Requires significant amount of motivation 5. Neuropsych: This patientis not fullycapable of making decisions on hisown behalf. 6. Skin/Wound Care:Monitor surgical wounds daily for healing . 7.  Fluids/Electrolytes/Nutrition:Monitor I/O.   Advanced to D2 honey on 12/6, water protocol initiated on 12/14  IVF qhs DC'd on 12/12    Magnesium within normal limits on 12/6  BMP within acceptable limits on 12/14 8. ESBL E coli (penile meatus): Completed meropenem and Vancomycin  9. Hypokalemia: Resolved   Con to monitor 10.?Anoxic BI: Seroquel d/ced  Establish better sleep wake cycle 11. Leukocytosis: Resolved   Continue to monitor for trends   Afebrile   Cont to monitor 12. WJX:[id number]:Amiodarone 200 mg bid and coreg addedon11/29 13. COPD with chronic hypoxic/hypercarbic failure:  Weaned supplemental O2   Encouraging IS 14. Acute systolic heart failure due to cardiogenic shock: EF improved to 35-40% on Echo on 12/10. No lasix. Monitor weight for signs of overload. Check weights daily.  Filed Weights   05/11/17 0323 05/12/17 0500 05/13/17 0235  Weight: 89.1 kg (196 lb 8.3 oz) 85.6 kg (188 lb 11.4 oz) 86.8 kg (191 lb 5.8 oz)   15. ABLA: Monitor H/H serially.    Hb  9.7 on 12/14  Cont to monitor 16. Hyperglycemia- resolved off TF, d/ced CBG    CBG (last 3)  No results for input(s): GLUCAP in the last 72 hours.  Cont to monitor 17. Essential HTN Vitals:   05/13/17 0235 05/13/17 0918  BP: 135/60 (!) 142/72  Pulse: 77 74  Resp: 18   Temp: 97.8 F (36.6 C)   SpO2: 94%    Decrease cozaar to 12.5 on 12/12   Controlled on 12/15  LOS (Days) 16 A FACE TO FACE EVALUATION WAS PERFORMED  Ranelle OysterSWARTZ,ZACHARY T, MD 05/13/2017 9:37 AM

## 2017-05-13 NOTE — Progress Notes (Signed)
Pt in room, sister has arrived, received discharge information, ready to leave.

## 2017-05-15 ENCOUNTER — Telehealth: Payer: Self-pay | Admitting: *Deleted

## 2017-05-15 DIAGNOSIS — R21 Rash and other nonspecific skin eruption: Secondary | ICD-10-CM

## 2017-05-15 HISTORY — DX: Rash and other nonspecific skin eruption: R21

## 2017-05-15 NOTE — Telephone Encounter (Signed)
Transitional care call completed, appointment confirmed, address confirmed, new patient packet sent  Transitional Care Questions   Questions for our staff to ask patients on Transitional care 48 hour phone call:   1. Are you/is patient experiencing any problems since coming home? No  Are there any questions regarding any aspect of care?  Not really  2. Are there any questions regarding medications administration/dosing? No  Are meds being taken as prescribed?  Yes  Patient should review meds with caller to confirm   3. Have there been any falls?    No  4. Has Home Health been to the house and/or have they contacted you? Not yet If not, have you tried to contact them? Can we help you contact them?  No  5. Are bowels and bladder emptying properly? Yes  Are there any unexpected incontinence issues? No If applicable, is patient following bowel/bladder programs?   6. Any fevers, problems with breathing, unexpected pain?  No  7. Are there any skin problems or new areas of breakdown?  No, not really, arm poked from IV's  8. Has the patient/family member arranged specialty MD follow up (ie cardiology/neurology/renal/surgical/etc)? Yes  Can we help arrange?  no  9. Does the patient need any other services or support that we can help arrange?  Patient needs some DME, bedside commode, wheelchair belt.  Asking for dietary items as well including Thick-it, Hormel thick-n-easy milk, and cepacal mouth swabs  10. Are caregivers following through as expected in assisting the patient?  Yes  11. Has the patient quit smoking, drinking alcohol, or using drugs as recommended? Patient is not doing any of these things

## 2017-05-16 ENCOUNTER — Telehealth: Payer: Self-pay | Admitting: Physical Medicine & Rehabilitation

## 2017-05-16 NOTE — Telephone Encounter (Addendum)
During the transitional care call, the patient asked for the bedside commode as well.  They also asked about a wheelchair belt, Thick-it,  Hormel thick in/easy milk, and cepacol mouth swabs. Please advise

## 2017-05-16 NOTE — Telephone Encounter (Signed)
Inpatient case manager is going to call patient.  Thanks.

## 2017-05-16 NOTE — Telephone Encounter (Signed)
Discussed with inpatient case manager.

## 2017-05-16 NOTE — Telephone Encounter (Signed)
Ptn sister [name] [phone] office called and states that instead of the bedside commode - they would prefer a seat that could be added to home toilet to make it higher - instead of the bedside commode.  She also wonders if they can get a rubber pad for the bed to give him "baths" at the bedside until he can stand in shower.

## 2017-05-17 DIAGNOSIS — Z955 Presence of coronary angioplasty implant and graft: Secondary | ICD-10-CM | POA: Diagnosis not present

## 2017-05-17 DIAGNOSIS — Z951 Presence of aortocoronary bypass graft: Secondary | ICD-10-CM | POA: Diagnosis not present

## 2017-05-17 DIAGNOSIS — I5023 Acute on chronic systolic (congestive) heart failure: Secondary | ICD-10-CM | POA: Diagnosis not present

## 2017-05-17 DIAGNOSIS — R1310 Dysphagia, unspecified: Secondary | ICD-10-CM | POA: Diagnosis not present

## 2017-05-17 DIAGNOSIS — I2119 ST elevation (STEMI) myocardial infarction involving other coronary artery of inferior wall: Secondary | ICD-10-CM | POA: Diagnosis not present

## 2017-05-17 DIAGNOSIS — I110 Hypertensive heart disease with heart failure: Secondary | ICD-10-CM | POA: Diagnosis not present

## 2017-05-17 DIAGNOSIS — I2510 Atherosclerotic heart disease of native coronary artery without angina pectoris: Secondary | ICD-10-CM | POA: Diagnosis not present

## 2017-05-17 DIAGNOSIS — G931 Anoxic brain damage, not elsewhere classified: Secondary | ICD-10-CM | POA: Diagnosis not present

## 2017-05-17 DIAGNOSIS — Z48812 Encounter for surgical aftercare following surgery on the circulatory system: Secondary | ICD-10-CM | POA: Diagnosis not present

## 2017-05-17 DIAGNOSIS — Z7982 Long term (current) use of aspirin: Secondary | ICD-10-CM | POA: Diagnosis not present

## 2017-05-17 DIAGNOSIS — J449 Chronic obstructive pulmonary disease, unspecified: Secondary | ICD-10-CM | POA: Diagnosis not present

## 2017-05-18 ENCOUNTER — Telehealth: Payer: Self-pay | Admitting: *Deleted

## 2017-05-18 DIAGNOSIS — I110 Hypertensive heart disease with heart failure: Secondary | ICD-10-CM | POA: Diagnosis not present

## 2017-05-18 DIAGNOSIS — I2510 Atherosclerotic heart disease of native coronary artery without angina pectoris: Secondary | ICD-10-CM | POA: Diagnosis not present

## 2017-05-18 DIAGNOSIS — G931 Anoxic brain damage, not elsewhere classified: Secondary | ICD-10-CM | POA: Diagnosis not present

## 2017-05-18 DIAGNOSIS — J449 Chronic obstructive pulmonary disease, unspecified: Secondary | ICD-10-CM | POA: Diagnosis not present

## 2017-05-18 DIAGNOSIS — I5023 Acute on chronic systolic (congestive) heart failure: Secondary | ICD-10-CM | POA: Diagnosis not present

## 2017-05-18 DIAGNOSIS — I2119 ST elevation (STEMI) myocardial infarction involving other coronary artery of inferior wall: Secondary | ICD-10-CM | POA: Diagnosis not present

## 2017-05-18 NOTE — Telephone Encounter (Signed)
[name] called about a medication issue at the pharmacy. The number he left is not registered to him or family but someone who lives in [place] (they have a chart in epic and no contacts in common)..  There was no answer and only vm stating number. I tried his home number and no answer there or name.  Left message to call the office on home number.  He did mention what I think is Coreg though his name for it was not clear.

## 2017-05-19 ENCOUNTER — Encounter: Payer: Medicare Other | Admitting: Physical Medicine & Rehabilitation

## 2017-05-22 DIAGNOSIS — I110 Hypertensive heart disease with heart failure: Secondary | ICD-10-CM | POA: Diagnosis not present

## 2017-05-22 DIAGNOSIS — I2510 Atherosclerotic heart disease of native coronary artery without angina pectoris: Secondary | ICD-10-CM | POA: Diagnosis not present

## 2017-05-22 DIAGNOSIS — G931 Anoxic brain damage, not elsewhere classified: Secondary | ICD-10-CM | POA: Diagnosis not present

## 2017-05-22 DIAGNOSIS — I5023 Acute on chronic systolic (congestive) heart failure: Secondary | ICD-10-CM | POA: Diagnosis not present

## 2017-05-22 DIAGNOSIS — I2119 ST elevation (STEMI) myocardial infarction involving other coronary artery of inferior wall: Secondary | ICD-10-CM | POA: Diagnosis not present

## 2017-05-22 DIAGNOSIS — J449 Chronic obstructive pulmonary disease, unspecified: Secondary | ICD-10-CM | POA: Diagnosis not present

## 2017-05-25 ENCOUNTER — Encounter: Payer: Self-pay | Admitting: Physical Medicine & Rehabilitation

## 2017-05-25 ENCOUNTER — Encounter: Payer: Medicare Other | Attending: Physical Medicine & Rehabilitation | Admitting: Physical Medicine & Rehabilitation

## 2017-05-25 ENCOUNTER — Other Ambulatory Visit: Payer: Self-pay

## 2017-05-25 VITALS — BP 117/68 | HR 74

## 2017-05-25 DIAGNOSIS — I5021 Acute systolic (congestive) heart failure: Secondary | ICD-10-CM | POA: Insufficient documentation

## 2017-05-25 DIAGNOSIS — Z955 Presence of coronary angioplasty implant and graft: Secondary | ICD-10-CM | POA: Diagnosis not present

## 2017-05-25 DIAGNOSIS — I252 Old myocardial infarction: Secondary | ICD-10-CM | POA: Diagnosis not present

## 2017-05-25 DIAGNOSIS — I253 Aneurysm of heart: Secondary | ICD-10-CM | POA: Diagnosis not present

## 2017-05-25 DIAGNOSIS — J449 Chronic obstructive pulmonary disease, unspecified: Secondary | ICD-10-CM | POA: Insufficient documentation

## 2017-05-25 DIAGNOSIS — R5381 Other malaise: Secondary | ICD-10-CM | POA: Insufficient documentation

## 2017-05-25 DIAGNOSIS — I110 Hypertensive heart disease with heart failure: Secondary | ICD-10-CM | POA: Insufficient documentation

## 2017-05-25 DIAGNOSIS — I2119 ST elevation (STEMI) myocardial infarction involving other coronary artery of inferior wall: Secondary | ICD-10-CM | POA: Diagnosis not present

## 2017-05-25 DIAGNOSIS — R200 Anesthesia of skin: Secondary | ICD-10-CM | POA: Diagnosis not present

## 2017-05-25 DIAGNOSIS — G8929 Other chronic pain: Secondary | ICD-10-CM | POA: Insufficient documentation

## 2017-05-25 DIAGNOSIS — I5023 Acute on chronic systolic (congestive) heart failure: Secondary | ICD-10-CM | POA: Diagnosis not present

## 2017-05-25 DIAGNOSIS — Z95811 Presence of heart assist device: Secondary | ICD-10-CM | POA: Insufficient documentation

## 2017-05-25 DIAGNOSIS — I10 Essential (primary) hypertension: Secondary | ICD-10-CM | POA: Diagnosis not present

## 2017-05-25 DIAGNOSIS — R269 Unspecified abnormalities of gait and mobility: Secondary | ICD-10-CM | POA: Insufficient documentation

## 2017-05-25 DIAGNOSIS — D62 Acute posthemorrhagic anemia: Secondary | ICD-10-CM | POA: Diagnosis not present

## 2017-05-25 DIAGNOSIS — I238 Other current complications following acute myocardial infarction: Secondary | ICD-10-CM

## 2017-05-25 DIAGNOSIS — R21 Rash and other nonspecific skin eruption: Secondary | ICD-10-CM | POA: Insufficient documentation

## 2017-05-25 DIAGNOSIS — R1310 Dysphagia, unspecified: Secondary | ICD-10-CM | POA: Insufficient documentation

## 2017-05-25 DIAGNOSIS — G931 Anoxic brain damage, not elsewhere classified: Secondary | ICD-10-CM | POA: Diagnosis not present

## 2017-05-25 DIAGNOSIS — Z87891 Personal history of nicotine dependence: Secondary | ICD-10-CM | POA: Insufficient documentation

## 2017-05-25 DIAGNOSIS — I2510 Atherosclerotic heart disease of native coronary artery without angina pectoris: Secondary | ICD-10-CM | POA: Diagnosis not present

## 2017-05-25 NOTE — Progress Notes (Signed)
Subjective:    Patient ID: [name], male    DOB: 1951/02/23, 66 y.o.   MRN: [id number]  HPI 66 y.o. male with history of CAD s/p STEMI, HTN, COPD, chronic neck pain presents for transitional care management after receiving CIR for debility.  Admit date: 04/27/2017 Discharge date: 05/13/2017  Presents with sister, who provides much of history. At discharge, he was instructed to follow up with Cardiology; he has an appointment. Rash is improving. His diet has been advanced to a nectar diet. He denies issues with breathing. BP is controlled. Denies falls.  Therapies: 1-2 week Mobility: Walker at all times. DME: Shower chair  Pain Inventory Average Pain 0 Pain Right Now 0 My pain is no pain  In the last 24 hours, has pain interfered with the following? General activity 0 Relation with others 0 Enjoyment of life 0 What TIME of day is your pain at its worst? no pain Sleep (in general) NA  Pain is worse with: no pain Pain improves with: no pain Relief from Meds: no pain  Mobility walk without assistance walk with assistance use a walker ability to climb steps?  no do you drive?  no use a wheelchair transfers alone  Function disabled: date disabled Apr 11 2017 I need assistance with the following:  meal prep, household duties and shopping  Neuro/Psych weakness numbness dizziness confusion  Prior Studies Any changes since last visit?  no  Physicians involved in your care Any changes since last visit?  no   No family history on file. Social History   Socioeconomic History  . Marital status: Divorced    Spouse name: None  . Number of children: None  . Years of education: None  . Highest education level: None  Social Needs  . Financial resource strain: None  . Food insecurity - worry: None  . Food insecurity - inability: None  . Transportation needs - medical: None  . Transportation needs - non-medical: None  Occupational History  . None  Tobacco  Use  . Smoking status: Former Smoker    Types: Cigarettes    Last attempt to quit: 04/11/2017    Years since quitting: 0.1  . Smokeless tobacco: Never Used  Substance and Sexual Activity  . Alcohol use: None  . Drug use: None  . Sexual activity: None  Other Topics Concern  . None  Social History Narrative  . None   Past Surgical History:  Procedure Laterality Date  . APPLICATION OF WOUND VAC N/A 04/14/2017   Procedure: APPLICATION OF WOUND VAC with reposition of Impella device;  Surgeon: Delight OvensGerhardt, Edward B, MD;  [facility];  Service: Thoracic;  Laterality: N/A;  . CORONARY STENT INTERVENTION N/A 04/11/2017   Procedure: CORONARY STENT INTERVENTION;  Surgeon: Corky CraftsVaranasi, Jayadeep S, MD;  Location: [facility] INVASIVE CV LAB;  Service: Cardiovascular;  Laterality: N/A;  . CORONARY THROMBECTOMY N/A 04/11/2017   Procedure: Coronary Thrombectomy;  Surgeon: Corky CraftsVaranasi, Jayadeep S, MD;  [facility];  Service: Cardiovascular;  Laterality: N/A;  . CORONARY/GRAFT ACUTE MI REVASCULARIZATION N/A 04/11/2017   Procedure: Coronary/Graft Acute MI Revascularization;  Surgeon: Corky CraftsVaranasi, Jayadeep S, MD;  [facility];  Service: Cardiovascular;  Laterality: N/A;  . IABP INSERTION N/A 04/11/2017   Procedure: IABP Insertion;  Surgeon: Corky CraftsVaranasi, Jayadeep S, MD;  [facility];  Service: Cardiovascular;  Laterality: N/A;  . LEFT HEART CATH AND CORONARY ANGIOGRAPHY N/A 04/11/2017   Procedure: LEFT HEART CATH AND CORONARY ANGIOGRAPHY;  Surgeon: Corky CraftsVaranasi, Jayadeep S, MD;  [facility];  Service: Cardiovascular;  Laterality: N/A;  . PLACEMENT OF IMPELLA LEFT VENTRICULAR ASSIST DEVICE  04/12/2017   Procedure: PLACEMENT OF IMPELLA  LD;  Surgeon: Delight OvensGerhardt, Edward B, MD;  [facility];  Service: Open Heart Surgery;;  . REMOVAL OF IMPELLA LEFT VENTRICULAR ASSIST DEVICE N/A 04/17/2017   Procedure: REMOVAL OF IMPELLA LEFT VENTRICULAR ASSIST DEVICE;  Surgeon:  Delight OvensGerhardt, Edward B, MD;  [facility];  Service: Open Heart Surgery;  Laterality: N/A;  . REPAIR OF RIGHT VENTRICLE LACERATION Left 04/12/2017   Procedure: Resection of LV Aneurysm;  Surgeon: Delight OvensGerhardt, Edward B, MD;  [facility];  Service: Open Heart Surgery;  Laterality: Left;  . STERNAL CLOSURE N/A 04/14/2017   Procedure: STERNAL CLOSURE;  Surgeon: Delight OvensGerhardt, Edward B, MD;  [facility];  Service: Thoracic;  Laterality: N/A;  . TEE WITHOUT CARDIOVERSION N/A 04/14/2017   Procedure: TRANSESOPHAGEAL ECHOCARDIOGRAM (TEE);  Surgeon: Delight OvensGerhardt, Edward B, MD;  [facility];  Service: Thoracic;  Laterality: N/A;  . ULTRASOUND GUIDANCE FOR VASCULAR ACCESS  04/11/2017   Procedure: Ultrasound Guidance For Vascular Access;  Surgeon: Corky CraftsVaranasi, Jayadeep S, MD;  [facility];  Service: Cardiovascular;;   Past Medical History:  Diagnosis Date  . Coronary artery disease   . Hypertension    BP 117/68   Pulse 74   SpO2 (!) 89%   Opioid Risk Score:   Fall Risk Score:  `1  Depression screen PHQ 2/9  Depression screen PHQ 2/9 05/25/2017  Decreased Interest 0  Down, Depressed, Hopeless 0  PHQ - 2 Score 0  Altered sleeping 0  Tired, decreased energy 2  Change in appetite 0  Feeling bad or failure about yourself  0  Trouble concentrating 0  Moving slowly or fidgety/restless 0  Suicidal thoughts 0  PHQ-9 Score 2    Review of Systems  Constitutional: Positive for unexpected weight change.  HENT: Positive for trouble swallowing.        On thickened liquid after trach  Eyes: Positive for visual disturbance.       Occasional blurred vision  Respiratory: Positive for cough, shortness of breath and wheezing.   Cardiovascular: Negative.   Gastrointestinal: Positive for nausea.  Endocrine: Negative.   Genitourinary: Negative.   Musculoskeletal: Negative.   Skin: Positive for rash.  Allergic/Immunologic: Negative.   Neurological: Positive for numbness.       Tingling    Hematological: Bruises/bleeds easily.       On Brilinta  Psychiatric/Behavioral: Positive for confusion.  All other systems reviewed and are negative.      Objective:   Physical Exam Constitutional: He appears well-developed and well-nourished. No distress.  HENT: Normocephalic and atraumatic.  Eyes: EOM are normal. No discharge.  Cardiovascular: +Murmur no JVD Respiratory: Lungs clear. Unlabored. GI: Bowel sounds are normal. He exhibits no distension.  Musculoskeletal: He exhibits no edema, no tenderness. Neurological: He is alert.  He can follow simple commands.   Motor: B/l UE 4/5 proximal to distal (with exception of 4-5 digits LUE) B/l LE: HF 4+/5, KE 4+/5, ADF/PF 4+/5  Skin is warm and dry. He is not diaphoretic.  Abdominal and sternal incisions c/d/i. Psychiatric: Normal mood.    Assessment & Plan:  66 y.o. male with history of CAD s/p STEMI, HTN, COPD, chronic neck pain presents for transitional care management after receiving CIR for debility.  1. Debility secondary to coronary artery disease and left ventricular rupture  with acute systolic heart failure  Cont therapies  Follow up with Cardiolog  2. Dysphagia  Follow up with SLP  Advance diet as tolerated  3. COPD    Cont therapies  Follow up with PCP  4. Acute systolic heart failure due to cardiogenic shock   Cont therapies  Follow up with Cards  5. Essential HTN   Follow up with Cards  Cont meds  6. Gait abnormality  Cont walker for safety  Cont therapies

## 2017-05-26 DIAGNOSIS — I2119 ST elevation (STEMI) myocardial infarction involving other coronary artery of inferior wall: Secondary | ICD-10-CM | POA: Diagnosis not present

## 2017-05-26 DIAGNOSIS — I5023 Acute on chronic systolic (congestive) heart failure: Secondary | ICD-10-CM | POA: Diagnosis not present

## 2017-05-26 DIAGNOSIS — I110 Hypertensive heart disease with heart failure: Secondary | ICD-10-CM | POA: Diagnosis not present

## 2017-05-26 DIAGNOSIS — I2510 Atherosclerotic heart disease of native coronary artery without angina pectoris: Secondary | ICD-10-CM | POA: Diagnosis not present

## 2017-05-26 DIAGNOSIS — G931 Anoxic brain damage, not elsewhere classified: Secondary | ICD-10-CM | POA: Diagnosis not present

## 2017-05-26 DIAGNOSIS — J449 Chronic obstructive pulmonary disease, unspecified: Secondary | ICD-10-CM | POA: Diagnosis not present

## 2017-05-26 NOTE — Progress Notes (Signed)
   05/26/17 1500  SLP G-Codes **NOT FOR INPATIENT CLASS**  Functional Assessment Tool Used skilled clinical judgement via chart review  Functional Limitations Memory  Swallow Current Status (U9811(G8996) CJ  Swallow Goal Status (B1478(G8997) CK  . Late note entered for evaluation completed by Jackalyn LombardNicole Page, SLP on 04/28/17 Ferdinand LangoLeah Shonte Beutler MA, CCC-SLP [phone]

## 2017-05-26 NOTE — Progress Notes (Signed)
PT Note Addendum Late entry for missed G-code on 05/12/17 Based on review of documentation and goals    05/12/17 1421  PT G-Codes **NOT FOR INPATIENT CLASS**  Functional Assessment Tool Used Clinical judgement (chart review)  Functional Limitation Mobility: Walking and moving around  Mobility: Walking and Moving Around Goal Status [phone](G8979) CI  Mobility: Walking and Moving Around Discharge Status [phone](G8980) CI

## 2017-05-26 NOTE — Progress Notes (Signed)
   04/28/17 1133  OT G-codes **NOT FOR INPATIENT CLASS**  Functional Assessment Tool Used Clinical judgement  Functional Limitation Self care  Self Care Current Status (W0981(G8987) CK  Self Care Goal Status (X9147(G8988) CI  Late G-Code Entry, OT eval, entered for McDonald's CorporationJulia Saguier, OT.   Marcy SirenBreanna Sarvesh Meddaugh, OT Pager [phone] 05/26/2017

## 2017-05-26 NOTE — Progress Notes (Signed)
PT eval addendum PT Evaluation Addendum Late entry for missed G-code on 04/28/17 Based on review of documentation and goals    04/28/17 1444  PT G-Codes **NOT FOR INPATIENT CLASS**  Functional Assessment Tool Used Clinical judgement (chart review)  Functional Limitation Mobility: Walking and moving around  Mobility: Walking and Moving Around Current Status [phone](G8978) CK  Mobility: Walking and Moving Around Goal Status [phone](G8979) CI  05/26/2017 Corlis HoveMargie Trew Sunde, PT [phone]

## 2017-05-26 NOTE — Progress Notes (Signed)
PT Note Addendum Late entry for missed G-code on 05/05/17 Based on review of documentation and goals    05/05/17 1420  PT G-Codes **NOT FOR INPATIENT CLASS**  Functional Assessment Tool Used Clinical judgement (chart review)  Functional Limitation Mobility: Walking and moving around  Mobility: Walking and Moving Around Current Status (Z6109(G8978) CJ  Mobility: Walking and Moving Around Goal Status [phone](G8979) CI  05/26/2017 Corlis HoveMargie Djon [name], PT [phone]

## 2017-05-26 NOTE — Progress Notes (Signed)
   05/12/17 0700  OT G-codes **NOT FOR INPATIENT CLASS**  Functional Assessment Tool Used AM-PAC 6 Clicks Daily Activity  Functional Limitation Self care  Self Care Goal Status (Z3086(G8988) CI  Self Care Discharge Status (V7846(G8989) CI  Late G-Code entry; OT discharge.  Marcy SirenBreanna Cheyene Hamric, OT Pager [phone] 05/26/2017

## 2017-05-26 NOTE — Progress Notes (Signed)
   05/26/17 1552  SLP G-Codes **NOT FOR INPATIENT CLASS**  Functional Assessment Tool Used skilled clinical judgement via chart review  Functional Limitations Memory  Swallow Current Status (O9629(G8996) CM  Swallow Goal Status (B2841(G8997) CM  Attention Current Status (L2440(G9165) CM  Late note for discharge completed by Jackalyn LombardNicole Page, SLP on 12/14.   Ferdinand LangoLeah Ekaterini Capitano MA, CCC-SLP [phone]

## 2017-05-26 NOTE — Progress Notes (Signed)
   05/26/17 1549  SLP G-Codes **NOT FOR INPATIENT CLASS**  Functional Assessment Tool Used skilled clinical judgement via chart review  Functional Limitations Memory  Swallow Current Status (Z6109(G8996) CL  Swallow Goal Status (U0454(G8997) CM  Late note entered for treatment session completed by Jackalyn LombardNicole Page, SLP on 05/09/17.  Ferdinand LangoLeah Gaurav Baldree MA, CCC-SLP [phone]

## 2017-05-26 NOTE — Progress Notes (Signed)
   05/05/17 1200  OT G-codes **NOT FOR INPATIENT CLASS**  Functional Assessment Tool Used Clinical judgement  Functional Limitation Self care  Self Care Current Status (Z6109(G8987) CJ  Self Care Goal Status (U0454(G8988) CI  Late G-Code Entry, OT tx, entered for [employer] CorporationJulia Saguier, OT.  Marcy SirenBreanna Carlyle Mcelrath, OT Pager [phone] 05/26/2017

## 2017-05-29 DIAGNOSIS — I2510 Atherosclerotic heart disease of native coronary artery without angina pectoris: Secondary | ICD-10-CM | POA: Diagnosis not present

## 2017-05-29 DIAGNOSIS — I110 Hypertensive heart disease with heart failure: Secondary | ICD-10-CM | POA: Diagnosis not present

## 2017-05-29 DIAGNOSIS — I5023 Acute on chronic systolic (congestive) heart failure: Secondary | ICD-10-CM | POA: Diagnosis not present

## 2017-05-29 DIAGNOSIS — I2119 ST elevation (STEMI) myocardial infarction involving other coronary artery of inferior wall: Secondary | ICD-10-CM | POA: Diagnosis not present

## 2017-05-29 DIAGNOSIS — G931 Anoxic brain damage, not elsewhere classified: Secondary | ICD-10-CM | POA: Diagnosis not present

## 2017-05-29 DIAGNOSIS — J449 Chronic obstructive pulmonary disease, unspecified: Secondary | ICD-10-CM | POA: Diagnosis not present

## 2017-05-31 DIAGNOSIS — I2119 ST elevation (STEMI) myocardial infarction involving other coronary artery of inferior wall: Secondary | ICD-10-CM | POA: Diagnosis not present

## 2017-05-31 DIAGNOSIS — G931 Anoxic brain damage, not elsewhere classified: Secondary | ICD-10-CM | POA: Diagnosis not present

## 2017-05-31 DIAGNOSIS — I5023 Acute on chronic systolic (congestive) heart failure: Secondary | ICD-10-CM | POA: Diagnosis not present

## 2017-05-31 DIAGNOSIS — J449 Chronic obstructive pulmonary disease, unspecified: Secondary | ICD-10-CM | POA: Diagnosis not present

## 2017-05-31 DIAGNOSIS — I110 Hypertensive heart disease with heart failure: Secondary | ICD-10-CM | POA: Diagnosis not present

## 2017-05-31 DIAGNOSIS — I2510 Atherosclerotic heart disease of native coronary artery without angina pectoris: Secondary | ICD-10-CM | POA: Diagnosis not present

## 2017-06-01 DIAGNOSIS — J449 Chronic obstructive pulmonary disease, unspecified: Secondary | ICD-10-CM | POA: Diagnosis not present

## 2017-06-01 DIAGNOSIS — I5023 Acute on chronic systolic (congestive) heart failure: Secondary | ICD-10-CM | POA: Diagnosis not present

## 2017-06-01 DIAGNOSIS — I2510 Atherosclerotic heart disease of native coronary artery without angina pectoris: Secondary | ICD-10-CM | POA: Diagnosis not present

## 2017-06-01 DIAGNOSIS — I2119 ST elevation (STEMI) myocardial infarction involving other coronary artery of inferior wall: Secondary | ICD-10-CM | POA: Diagnosis not present

## 2017-06-01 DIAGNOSIS — G931 Anoxic brain damage, not elsewhere classified: Secondary | ICD-10-CM | POA: Diagnosis not present

## 2017-06-01 DIAGNOSIS — I110 Hypertensive heart disease with heart failure: Secondary | ICD-10-CM | POA: Diagnosis not present

## 2017-06-01 NOTE — Progress Notes (Signed)
PCP: Primary Cardiologist: Dr Gala RomneyBensimhon. CT Surgery: Dr Gala RomneyBensimhon    HPI: [name] is 67 year old with h/o MI, HTN, COPD, anemia, CAD with DES to RCA, and LV pseudo aneurysm repair.    Admitted 04/11/17 with chest pain. EKG with evidence of inferior/lateral STEMI. Due to lethargy and hypoxemia he was intubated in the ED. Taken to cath lab with RCA DES and POBA to chronically occluded LADwith IABP placed.Had LV pseudo aneurysm repair 04/12/17. Once stable [facility] and later discharged.    Today he returns for post hospital follow up. Overall feeling fine. Says he is getting stronger. Appetite improving. Denies SOB/PND/Orthopnea/CP. No fever or chills. he is not weighing at home. Taking all medications. [facility].  Plans to live wit his sister Lynden Ang(Cathy) for a while.   12/102018 ECHO  Left ventricle: The cavity size was normal. Wall thickness was   increased in a pattern of mild LVH. Systolic function was   moderately reduced. The estimated ejection fraction was in the   range of 35% to 40%. Global hypokinesis with severe hypokinesis   of the inferior, apical and mid-apical inferoseptal myocardium.   Features are consistent with a pseudonormal left ventricular   filling pattern, with concomitant abnormal relaxation and   increased filling pressure (grade 2 diastolic dysfunction).   Doppler parameters are consistent with indeterminate ventricular   filling pressure. There was an apicalthrombus. - Aortic valve: Transvalvular velocity was within the normal range.   There was no stenosis. There was no regurgitation. - Mitral valve: Transvalvular velocity was within the normal range.   There was no evidence for stenosis. There was no regurgitation. - Right ventricle: The cavity size was normal. Wall thickness was   normal. Systolic function was normal. - Atrial septum: No defect or patent foramen ovale was identified.   ROS: All  systems negative except as listed in HPI, PMH and Problem List.  SH:  Social History   Socioeconomic History  . Marital status: Divorced    Spouse name: Not on file  . Number of children: Not on file  . Years of education: Not on file  . Highest education level: Not on file  Social Needs  . Financial resource strain: Not on file  . Food insecurity - worry: Not on file  . Food insecurity - inability: Not on file  . Transportation needs - medical: Not on file  . Transportation needs - non-medical: Not on file  Occupational History  . Not on file  Tobacco Use  . Smoking status: Former Smoker    Types: Cigarettes    Last attempt to quit: 04/11/2017    Years since quitting: 0.1  . Smokeless tobacco: Never Used  Substance and Sexual Activity  . Alcohol use: Not on file  . Drug use: Not on file  . Sexual activity: Not on file  Other Topics Concern  . Not on file  Social History Narrative  . Not on file    FH: No family history on file.  Past Medical History:  Diagnosis Date  . Coronary artery disease   . Hypertension     Current Outpatient Medications  Medication Sig Dispense Refill  . amiodarone (PACERONE) 200 MG tablet Take 1 tablet (200 mg total) by mouth 2 (two) times daily. 60 tablet 0  . atorvastatin (LIPITOR) 80 MG tablet Take 1 tablet (80 mg total) by mouth daily at 6 PM. 30 tablet 0  .  carvedilol (COREG) 3.125 MG tablet Take 1 tablet (3.125 mg total) by mouth 2 (two) times daily with a meal. 60 tablet 0  . losartan (COZAAR) 25 MG tablet Take 0.5 tablets (12.5 mg total) by mouth at bedtime. 15 tablet 0  . pantoprazole (PROTONIX) 40 MG tablet Take 1 tablet (40 mg total) by mouth daily. 30 tablet 0  . ticagrelor (BRILINTA) 90 MG TABS tablet Take 1 tablet (90 mg total) by mouth 2 (two) times daily. 60 tablet 0  . triamcinolone cream (KENALOG) 0.5 % Apply topically 4 (four) times daily. Apply thin smear to rash 60 g 2  . acetaminophen (TYLENOL) 325 MG tablet Take 1-2  tablets (325-650 mg total) by mouth every 4 (four) hours as needed for mild pain. (Patient not taking: Reported on 06/02/2017)    . aspirin 81 MG chewable tablet Place 1 tablet (81 mg total) into feeding tube daily. (Patient not taking: Reported on 06/02/2017)    . Maltodextrin-Xanthan Gum (RESOURCE THICKENUP CLEAR) POWD Thicken all liquids to honey thick consistency 4 Can 2  . predniSONE (DELTASONE) 10 MG tablet On Sunday take 3 pills with breakfast. Then decrease to 2 pills on Monday and one pill on Tuesday then stop. 6 tablet 0  . senna (SENOKOT) 8.6 MG TABS tablet Take 1 tablet (8.6 mg total) by mouth at bedtime. (Patient not taking: Reported on 06/02/2017) 120 each 0   No current facility-administered medications for this encounter.     Vitals:   06/02/17 0908  BP: (!) 164/62  Pulse: 65  SpO2: 98%  Weight: 183 lb 3.2 oz (83.1 kg)    PHYSICAL EXAM: General:  Appears chronically ill. No resp difficulty. Walked in the clinic with his sister.  HEENT: normal Neck: supple. JVP flat. Carotids 2+ bilaterally; no bruits. No lymphadenopathy or thryomegaly appreciated. Cor: PMI normal. Regular rate & rhythm. No rubs, gallops or murmurs. Lungs: clear Abdomen: soft, nontender, nondistended. No hepatosplenomegaly. No bruits or masses. Good bowel sounds. Extremities: no cyanosis, clubbing, rash, edema Neuro: alert & orientedx3, cranial nerves grossly intact. Moves all 4 extremities w/o difficulty. Affect pleasant.   ECG: NSR 62 bpm  Personally reviewed by Tonye Becket     EKG: NSR 62 bpm   ASSESSMENT & PLAN: 1. CAD  H/O OOH anterior MI c/b LV pseudoaneurysm that was repaired on 04/12/17.  - attempted PCI of LAD failed due to chronic occlusion and extensive infarct -03/2017  s/p PCI/DES to distal RCA.  - Restart asa 81 mg daily.  - Continue brilinta and atorvastatin -No s/s of ischemia. - S/P LV pseudoaneurysm repair 11/14, Has follow up later this month with [facility].    2.Chronicsystolic HF-> h/o cardiogenic shockresolved - EF improved 25%->40%. Plan to repeat in ECHO in a few months.  NYHA II.  - Volume status stable on exam. No need for diuretic at this point  - Increase losartan to 25 mg daily. - Hold off bb for now. Consider next visit.   3. COPD with chronic CO2 retention  - Stable. No wheeze.  4. PAF -EKG today. In NSR. Cut amio to 200 daily. Likely stop soon.  - Check LFTs next visit.   5. Cognitive deficits - improving slowly  Follow up in 3-4 weeks with Dr Gala Romney. Check BMET today. Discussed medications changes today.     Amy Clegg  NP-C  4:27 PM

## 2017-06-02 ENCOUNTER — Encounter (HOSPITAL_COMMUNITY): Payer: Self-pay

## 2017-06-02 ENCOUNTER — Ambulatory Visit (HOSPITAL_COMMUNITY)
Admission: RE | Admit: 2017-06-02 | Discharge: 2017-06-02 | Disposition: A | Discharge status: Home / Self Care | Payer: Medicare Other | Source: Ambulatory Visit | Attending: Internal Medicine | Admitting: Internal Medicine

## 2017-06-02 VITALS — BP 164/62 | HR 65 | Wt 183.2 lb

## 2017-06-02 DIAGNOSIS — I253 Aneurysm of heart: Secondary | ICD-10-CM | POA: Diagnosis not present

## 2017-06-02 DIAGNOSIS — Z9889 Other specified postprocedural states: Secondary | ICD-10-CM | POA: Insufficient documentation

## 2017-06-02 DIAGNOSIS — I252 Old myocardial infarction: Secondary | ICD-10-CM | POA: Diagnosis not present

## 2017-06-02 DIAGNOSIS — I480 Paroxysmal atrial fibrillation: Secondary | ICD-10-CM | POA: Diagnosis not present

## 2017-06-02 DIAGNOSIS — I110 Hypertensive heart disease with heart failure: Secondary | ICD-10-CM | POA: Insufficient documentation

## 2017-06-02 DIAGNOSIS — I4581 Long QT syndrome: Secondary | ICD-10-CM | POA: Diagnosis not present

## 2017-06-02 DIAGNOSIS — I5022 Chronic systolic (congestive) heart failure: Secondary | ICD-10-CM | POA: Diagnosis not present

## 2017-06-02 DIAGNOSIS — Z7982 Long term (current) use of aspirin: Secondary | ICD-10-CM | POA: Diagnosis not present

## 2017-06-02 DIAGNOSIS — I238 Other current complications following acute myocardial infarction: Secondary | ICD-10-CM | POA: Diagnosis not present

## 2017-06-02 DIAGNOSIS — D649 Anemia, unspecified: Secondary | ICD-10-CM | POA: Diagnosis not present

## 2017-06-02 DIAGNOSIS — J449 Chronic obstructive pulmonary disease, unspecified: Secondary | ICD-10-CM | POA: Insufficient documentation

## 2017-06-02 DIAGNOSIS — Z87891 Personal history of nicotine dependence: Secondary | ICD-10-CM | POA: Diagnosis not present

## 2017-06-02 DIAGNOSIS — R4189 Other symptoms and signs involving cognitive functions and awareness: Secondary | ICD-10-CM | POA: Diagnosis not present

## 2017-06-02 DIAGNOSIS — I2510 Atherosclerotic heart disease of native coronary artery without angina pectoris: Secondary | ICD-10-CM | POA: Insufficient documentation

## 2017-06-02 DIAGNOSIS — Z79899 Other long term (current) drug therapy: Secondary | ICD-10-CM | POA: Diagnosis not present

## 2017-06-02 DIAGNOSIS — E872 Acidosis: Secondary | ICD-10-CM | POA: Diagnosis not present

## 2017-06-02 DIAGNOSIS — G931 Anoxic brain damage, not elsewhere classified: Secondary | ICD-10-CM | POA: Diagnosis not present

## 2017-06-02 DIAGNOSIS — I5023 Acute on chronic systolic (congestive) heart failure: Secondary | ICD-10-CM | POA: Diagnosis not present

## 2017-06-02 DIAGNOSIS — I2119 ST elevation (STEMI) myocardial infarction involving other coronary artery of inferior wall: Secondary | ICD-10-CM | POA: Diagnosis not present

## 2017-06-02 LAB — BASIC METABOLIC PANEL
Anion gap: 6 (ref 5–15)
BUN: 11 mg/dL (ref 6–20)
CALCIUM: 8.8 mg/dL — AB (ref 8.9–10.3)
CO2: 28 mmol/L (ref 22–32)
Chloride: 106 mmol/L (ref 101–111)
Creatinine, Ser: 0.92 mg/dL (ref 0.61–1.24)
GFR calc Af Amer: >60 mL/min (ref >60)
GLUCOSE: 88 mg/dL (ref 65–99)
Potassium: 4.1 mmol/L (ref 3.5–5.1)
Sodium: 140 mmol/L (ref 135–145)

## 2017-06-02 MED ORDER — LOSARTAN POTASSIUM 25 MG PO TABS: 25.0000 mg | ORAL_TABLET | Freq: Every day | ORAL | 6 refills | Status: DC

## 2017-06-02 MED ORDER — AMIODARONE HCL 200 MG PO TABS: 200.0000 mg | ORAL_TABLET | Freq: Every day | ORAL | 6 refills | Status: DC

## 2017-06-02 MED ORDER — ASPIRIN 81 MG PO CHEW: 81.0000 mg | CHEWABLE_TABLET | Freq: Every day | ORAL | Status: DC

## 2017-06-02 NOTE — Patient Instructions (Addendum)
Routine lab work today. Will notify you of abnormal results, otherwise no news is good news!  START Asprin 81 mg tablet once daily.  DECREASE Amiodarone to 200 mg once daily.  INCREASE Losartan to 25 mg tablet once daily.  Follow up 4 weeks with Dr. Gala RomneyBensimhon.  Take all medication as prescribed the day of your appointment. Bring all medications with you to your appointment.  Do the following things EVERYDAY: 1) Weigh yourself in the morning before breakfast. Write it down and keep it in a log. 2) Take your medicines as prescribed 3) Eat low salt foods-Limit salt (sodium) to 2000 mg per day.  4) Stay as active as you can everyday 5) Limit all fluids for the day to less than 2 liters

## 2017-06-05 DIAGNOSIS — M79644 Pain in right finger(s): Secondary | ICD-10-CM | POA: Diagnosis not present

## 2017-06-05 DIAGNOSIS — G5621 Lesion of ulnar nerve, right upper limb: Secondary | ICD-10-CM | POA: Diagnosis not present

## 2017-06-05 DIAGNOSIS — M79641 Pain in right hand: Secondary | ICD-10-CM | POA: Diagnosis not present

## 2017-06-06 DIAGNOSIS — I2510 Atherosclerotic heart disease of native coronary artery without angina pectoris: Secondary | ICD-10-CM | POA: Diagnosis not present

## 2017-06-06 DIAGNOSIS — G931 Anoxic brain damage, not elsewhere classified: Secondary | ICD-10-CM | POA: Diagnosis not present

## 2017-06-06 DIAGNOSIS — I2119 ST elevation (STEMI) myocardial infarction involving other coronary artery of inferior wall: Secondary | ICD-10-CM | POA: Diagnosis not present

## 2017-06-06 DIAGNOSIS — J449 Chronic obstructive pulmonary disease, unspecified: Secondary | ICD-10-CM | POA: Diagnosis not present

## 2017-06-06 DIAGNOSIS — I110 Hypertensive heart disease with heart failure: Secondary | ICD-10-CM | POA: Diagnosis not present

## 2017-06-06 DIAGNOSIS — I5023 Acute on chronic systolic (congestive) heart failure: Secondary | ICD-10-CM | POA: Diagnosis not present

## 2017-06-08 DIAGNOSIS — I2510 Atherosclerotic heart disease of native coronary artery without angina pectoris: Secondary | ICD-10-CM | POA: Diagnosis not present

## 2017-06-08 DIAGNOSIS — I5023 Acute on chronic systolic (congestive) heart failure: Secondary | ICD-10-CM | POA: Diagnosis not present

## 2017-06-08 DIAGNOSIS — I2119 ST elevation (STEMI) myocardial infarction involving other coronary artery of inferior wall: Secondary | ICD-10-CM | POA: Diagnosis not present

## 2017-06-08 DIAGNOSIS — I110 Hypertensive heart disease with heart failure: Secondary | ICD-10-CM | POA: Diagnosis not present

## 2017-06-08 DIAGNOSIS — G931 Anoxic brain damage, not elsewhere classified: Secondary | ICD-10-CM | POA: Diagnosis not present

## 2017-06-08 DIAGNOSIS — J449 Chronic obstructive pulmonary disease, unspecified: Secondary | ICD-10-CM | POA: Diagnosis not present

## 2017-06-09 DIAGNOSIS — G931 Anoxic brain damage, not elsewhere classified: Secondary | ICD-10-CM | POA: Diagnosis not present

## 2017-06-09 DIAGNOSIS — I5023 Acute on chronic systolic (congestive) heart failure: Secondary | ICD-10-CM | POA: Diagnosis not present

## 2017-06-09 DIAGNOSIS — I110 Hypertensive heart disease with heart failure: Secondary | ICD-10-CM | POA: Diagnosis not present

## 2017-06-09 DIAGNOSIS — J449 Chronic obstructive pulmonary disease, unspecified: Secondary | ICD-10-CM | POA: Diagnosis not present

## 2017-06-09 DIAGNOSIS — I2510 Atherosclerotic heart disease of native coronary artery without angina pectoris: Secondary | ICD-10-CM | POA: Diagnosis not present

## 2017-06-09 DIAGNOSIS — I2119 ST elevation (STEMI) myocardial infarction involving other coronary artery of inferior wall: Secondary | ICD-10-CM | POA: Diagnosis not present

## 2017-06-11 ENCOUNTER — Other Ambulatory Visit: Payer: Self-pay | Admitting: Cardiology

## 2017-06-11 MED ORDER — PANTOPRAZOLE SODIUM 40 MG PO TBEC: 40.0000 mg | DELAYED_RELEASE_TABLET | Freq: Every day | ORAL | 0 refills | Status: DC

## 2017-06-11 MED ORDER — AMIODARONE HCL 200 MG PO TABS: 200.0000 mg | ORAL_TABLET | Freq: Every day | ORAL | 6 refills | Status: DC

## 2017-06-11 MED ORDER — ATORVASTATIN CALCIUM 80 MG PO TABS: 80.0000 mg | ORAL_TABLET | Freq: Every day | ORAL | 0 refills | Status: DC

## 2017-06-11 MED ORDER — TICAGRELOR 90 MG PO TABS: 90.0000 mg | ORAL_TABLET | Freq: Two times a day (BID) | ORAL | 2 refills | Status: DC

## 2017-06-12 ENCOUNTER — Telehealth (HOSPITAL_COMMUNITY): Payer: Self-pay | Admitting: *Deleted

## 2017-06-12 NOTE — Telephone Encounter (Signed)
Patients sister called to report nose bleed. Patient had a nose bleed yesterday that didn't last long today he had another bleed that lasted about 5 min and seems worse. Per Amy keep and eye on it for now and call PCP if it continues may need to report to the ED. Pts sister aware and agreeable.

## 2017-06-12 NOTE — Progress Notes (Signed)
   05/04/17 1943  Prognosis  Prognosis for Safe Diet Advancement Good  Barriers to Reach Goals Cognitive deficits  SLP G-Codes **NOT FOR INPATIENT CLASS**  Functional Assessment Tool Used skilled clinical judgement via chart review  Functional Limitations Swallowing  Swallow Current Status (W2956(G8996) CL  Swallow Goal Status (O1308(G8997) CK  Late note entered for original evaluation complete 05/04/17 by Jackalyn LombardNicole Page, SLP. Ferdinand LangoLeah Jenesis Martin MA, CCC-SLP [phone]

## 2017-06-13 ENCOUNTER — Telehealth (HOSPITAL_COMMUNITY): Payer: Self-pay | Admitting: *Deleted

## 2017-06-13 NOTE — Telephone Encounter (Signed)
Advanced Heart Failure Triage Encounter  Patient Name: [name]  Date of Call: 06/13/17  Problem: Medication quesiton  Patient concerned about possible side effects of bleeding while taking aspirin and brilinta.    Plan:  Per last office note patient is to be taking both medications.  I explained that if he were to start having uncontrolled bleeding or blood in his urine/stool to call us. No further questions.   Georgina PeerFarver, Broghan Pannone S, RN

## 2017-06-17 DIAGNOSIS — G931 Anoxic brain damage, not elsewhere classified: Secondary | ICD-10-CM | POA: Diagnosis not present

## 2017-06-17 DIAGNOSIS — I2119 ST elevation (STEMI) myocardial infarction involving other coronary artery of inferior wall: Secondary | ICD-10-CM | POA: Diagnosis not present

## 2017-06-17 DIAGNOSIS — I110 Hypertensive heart disease with heart failure: Secondary | ICD-10-CM | POA: Diagnosis not present

## 2017-06-17 DIAGNOSIS — I5023 Acute on chronic systolic (congestive) heart failure: Secondary | ICD-10-CM | POA: Diagnosis not present

## 2017-06-17 DIAGNOSIS — J449 Chronic obstructive pulmonary disease, unspecified: Secondary | ICD-10-CM | POA: Diagnosis not present

## 2017-06-17 DIAGNOSIS — I2510 Atherosclerotic heart disease of native coronary artery without angina pectoris: Secondary | ICD-10-CM | POA: Diagnosis not present

## 2017-06-20 DIAGNOSIS — I2119 ST elevation (STEMI) myocardial infarction involving other coronary artery of inferior wall: Secondary | ICD-10-CM | POA: Diagnosis not present

## 2017-06-20 DIAGNOSIS — I110 Hypertensive heart disease with heart failure: Secondary | ICD-10-CM | POA: Diagnosis not present

## 2017-06-20 DIAGNOSIS — I5023 Acute on chronic systolic (congestive) heart failure: Secondary | ICD-10-CM | POA: Diagnosis not present

## 2017-06-20 DIAGNOSIS — J449 Chronic obstructive pulmonary disease, unspecified: Secondary | ICD-10-CM | POA: Diagnosis not present

## 2017-06-20 DIAGNOSIS — I2510 Atherosclerotic heart disease of native coronary artery without angina pectoris: Secondary | ICD-10-CM | POA: Diagnosis not present

## 2017-06-20 DIAGNOSIS — G931 Anoxic brain damage, not elsewhere classified: Secondary | ICD-10-CM | POA: Diagnosis not present

## 2017-06-21 ENCOUNTER — Other Ambulatory Visit: Payer: Self-pay | Admitting: Cardiothoracic Surgery

## 2017-06-21 DIAGNOSIS — I253 Aneurysm of heart: Secondary | ICD-10-CM

## 2017-06-22 ENCOUNTER — Encounter: Payer: Self-pay | Admitting: Cardiothoracic Surgery

## 2017-06-22 ENCOUNTER — Ambulatory Visit (INDEPENDENT_AMBULATORY_CARE_PROVIDER_SITE_OTHER): Payer: Self-pay | Admitting: Cardiothoracic Surgery

## 2017-06-22 ENCOUNTER — Ambulatory Visit
Admission: RE | Admit: 2017-06-22 | Discharge: 2017-06-22 | Disposition: A | Discharge status: Home / Self Care | Payer: Medicare Other | Source: Ambulatory Visit | Attending: Cardiothoracic Surgery | Admitting: Cardiothoracic Surgery

## 2017-06-22 VITALS — BP 150/75 | HR 84 | Resp 20 | Ht 67.0 in | Wt 189.0 lb

## 2017-06-22 DIAGNOSIS — I253 Aneurysm of heart: Secondary | ICD-10-CM

## 2017-06-22 DIAGNOSIS — Z9889 Other specified postprocedural states: Secondary | ICD-10-CM

## 2017-06-22 DIAGNOSIS — J9809 Other diseases of bronchus, not elsewhere classified: Secondary | ICD-10-CM | POA: Diagnosis not present

## 2017-06-22 NOTE — Progress Notes (Signed)
[address]       [place] [address]             [phone]      Sade Mehlhoff [facility] Medical Record #[id number] Date of Birth: 03/17/1951  Referring: Dolores Patty, MD Primary Care: Patient, No Pcp Per Primary Cardiologist: No primary care provider on file.   Chief Complaint:   POST OP FOLLOW UP 04/11/2017 DATE OF DISCHARGE: OPERATIVE REPORT PREOPERATIVE DIAGNOSES:  Recent ST-elevation myocardial infarction, cardiogenic shock with question of impending ventricular rupture/left ventricular false aneurysm. POSTOPERATIVE DIAGNOSES:  Recent ST-elevation myocardial infarction, cardiogenic shock with question of impending ventricular rupture/left ventricular false aneurysm. SURGICAL PROCEDURE: 1. Cardiopulmonary bypass. 2. Resection and plication of left ventricular aneurysm, placement of     Impella 5.0. SURGEON:  Sheliah Plane, MD.   History of Present Illness:     Patient returns to the office in follow-up after repair of left ventricular false aneurysm November 13.  The patient was Admitted 04/11/17 with chest pain. EKG with evidence of inferior/lateral STEMI. Due to lethargy and hypoxemia he was intubated in the ED. Taken to cath lab with RCA DES and POBA to chronically occluded LADwith IABP placed.Had LV pseudo aneurysm repair 04/12/17. Once stable he transferred to [facility] for intensive rehab on 11/29 and later discharged.    Since discharge from rehab he has slowly progressed, now living with his sister.  He has been actively involved in outpatient rehab and made good progress there.  He is alert and cognitive function appears good.    Past Medical History:  Diagnosis Date  . Coronary artery disease   . Hypertension      Social History   Tobacco Use  Smoking Status Former Smoker  . Types: Cigarettes  . Last attempt to quit: 04/11/2017  . Years since quitting: 0.1  Smokeless Tobacco Never Used    Social History   Substance  and Sexual Activity  Alcohol Use Not on file     No Known Allergies  Current Outpatient Medications  Medication Sig Dispense Refill  . acetaminophen (TYLENOL) 325 MG tablet Take 1-2 tablets (325-650 mg total) by mouth every 4 (four) hours as needed for mild pain.    Marland Kitchen amiodarone (PACERONE) 200 MG tablet Take 1 tablet (200 mg total) by mouth daily. 30 tablet 6  . aspirin 81 MG chewable tablet Place 1 tablet (81 mg total) into feeding tube daily.    Marland Kitchen atorvastatin (LIPITOR) 80 MG tablet Take 1 tablet (80 mg total) by mouth daily at 6 PM. 30 tablet 0  . carvedilol (COREG) 3.125 MG tablet Take 1 tablet (3.125 mg total) by mouth 2 (two) times daily with a meal. 60 tablet 0  . losartan (COZAAR) 25 MG tablet Take 1 tablet (25 mg total) by mouth at bedtime. 30 tablet 6  . pantoprazole (PROTONIX) 40 MG tablet Take 1 tablet (40 mg total) by mouth daily. 30 tablet 0  . senna (SENOKOT) 8.6 MG TABS tablet Take 1 tablet (8.6 mg total) by mouth at bedtime. 120 each 0  . ticagrelor (BRILINTA) 90 MG TABS tablet Take 1 tablet (90 mg total) by mouth 2 (two) times daily. 60 tablet 2  . triamcinolone cream (KENALOG) 0.5 % Apply topically 4 (four) times daily. Apply thin smear to rash 60 g 2   No current facility-administered medications for this visit.        Physical Exam: BP (!) 150/75   Pulse 84  Resp 20   Ht 5\' 7"  (1.702 m)   Wt 189 lb (85.7 kg)   SpO2 98% Comment: RA  BMI 29.60 kg/m   General appearance: alert, cooperative, appears older than stated age and no distress Neurologic: intact Heart: regular rate and rhythm, S1, S2 normal, no murmur, click, rub or gallop and no rub Lungs: clear to auscultation bilaterally Abdomen: soft, non-tender; bowel sounds normal; no masses,  no organomegaly Extremities: extremities normal, atraumatic, no cyanosis or edema Wound: Sternum is stable and healing well   Diagnostic Studies & Laboratory data:     Recent Radiology Findings:   Dg Chest 2  View  Result Date: 06/22/2017 CLINICAL DATA:  Prior ventricular aneurysm repair. EXAM: CHEST  2 VIEW COMPARISON:  04/29/2017. FINDINGS: Prior median sternotomy. Stable cardiomegaly. No pulmonary venous congestion. No focal infiltrate. Mild left base pleural thickening most likely pleural scarring. Tiny effusion cannot be excluded. No pneumothorax. Degenerative changes thoracic spine. IMPRESSION: 1. Prior median sternotomy. Stable cardiomegaly. No pulmonary venous congestion. 2. Mild left sided pleural thickening noted most consistent with scarring. Electronically Signed   By: Maisie Fus  Register   On: 06/22/2017 11:25    I have independently reviewed the above radiology studies  and reviewed the findings with the patient.    Recent Lab Findings: Lab Results  Component Value Date   WBC 10.1 05/12/2017   HGB 9.7 (L) 05/12/2017   HCT 32.1 (L) 05/12/2017   PLT 239 05/12/2017   GLUCOSE 88 06/02/2017   CHOL 167 04/11/2017   TRIG 109 04/11/2017   HDL 27 (L) 04/11/2017   LDLCALC 118 (H) 04/11/2017   ALT 50 04/29/2017   AST 36 04/29/2017   NA 140 06/02/2017   K 4.1 06/02/2017   CL 106 06/02/2017   CREATININE 0.92 06/02/2017   BUN 11 06/02/2017   CO2 28 06/02/2017   INR 1.27 04/18/2017      *[place]*                   *[facility]*                         [address]                        [place], [place] [address]                            [phone]  ------------------------------------------------------------------- Transthoracic Echocardiography  Patient:    Moe, Graca MR #:       [id number] Study Date: 05/08/2017 Gender:     M Age:        67 Height:     170.2 cm Weight:     86.4 kg BSA:        2.05 m^2 Pt. Status: Room:       4W19C   ADMITTING    Allena Katz, Ankit Anil  ATTENDING    Allena Katz, Ankit Anil  ORDERING     Bensimhon, Daniel  REFERRING    Bensimhon, Daniel  PERFORMING   [facility], Inpatient  SONOGRAPHER  Dance,  Tiffany  cc:  ------------------------------------------------------------------- LV EF: 35% -   40%  ------------------------------------------------------------------- Indications:      CHF - 428.0.  ------------------------------------------------------------------- History:   PMH:   Coronary artery disease.  Risk factors: Hypertension.  ------------------------------------------------------------------- Study Conclusions  - Left ventricle: The cavity size was normal.  Wall thickness was   increased in a pattern of mild LVH. Systolic function was   moderately reduced. The estimated ejection fraction was in the   range of 35% to 40%. Global hypokinesis with severe hypokinesis   of the inferior, apical and mid-apical inferoseptal myocardium.   Features are consistent with a pseudonormal left ventricular   filling pattern, with concomitant abnormal relaxation and   increased filling pressure (grade 2 diastolic dysfunction).   Doppler parameters are consistent with indeterminate ventricular   filling pressure. There was an apicalthrombus. - Aortic valve: Transvalvular velocity was within the normal range.   There was no stenosis. There was no regurgitation. - Mitral valve: Transvalvular velocity was within the normal range.   There was no evidence for stenosis. There was no regurgitation. - Right ventricle: The cavity size was normal. Wall thickness was   normal. Systolic function was normal. - Atrial septum: No defect or patent foramen ovale was identified.  ------------------------------------------------------------------- Study data:  Comparison was made to the study of 04/14/2017.  Study status:  Routine.  Procedure:  The patient reported no pain pre or post test. Transthoracic echocardiography. Image quality was adequate. Intravenous contrast (Definity) was administered.  Study completion:  There were no complications.          Transthoracic echocardiography.   M-mode, complete 2D, spectral Doppler, and color Doppler.  Birthdate:  Patient birthdate: 23-Sep-1950.  Age:  Patient is 67 yr old.  Sex:  Gender: male.    BMI: 29.8 kg/m^2.  Blood pressure:     117/48  Patient status:  Inpatient.  Study date: Study date: 05/08/2017. Study time: 02:55 PM.  Location:  Bedside.   -------------------------------------------------------------------  ------------------------------------------------------------------- Left ventricle:  The cavity size was normal. Wall thickness was increased in a pattern of mild LVH. Systolic function was moderately reduced. The estimated ejection fraction was in the range of 35% to 40%. There was an apicalthrombus.  Regional wall motion abnormalities:  Global hypokinesis with severe hypokinesis of the inferior and apical myocardium. Features are consistent with a pseudonormal left ventricular filling pattern, with concomitant abnormal relaxation and increased filling pressure (grade 2 diastolic dysfunction). Doppler parameters are consistent with indeterminate ventricular filling pressure.  ------------------------------------------------------------------- Aortic valve:   Trileaflet; mildly thickened, mildly calcified leaflets. Mobility was not restricted.  Doppler:  Transvalvular velocity was within the normal range. There was no stenosis. There was no regurgitation.  ------------------------------------------------------------------- Aorta:  Aortic root: The aortic root was normal in size.  ------------------------------------------------------------------- Mitral valve:   Structurally normal valve.   Mobility was not restricted.  Doppler:  Transvalvular velocity was within the normal range. There was no evidence for stenosis. There was no regurgitation.  ------------------------------------------------------------------- Left atrium:  The atrium was normal in  size.  ------------------------------------------------------------------- Atrial septum:  No defect or patent foramen ovale was identified.   ------------------------------------------------------------------- Right ventricle:  The cavity size was normal. Wall thickness was normal. Systolic function was normal.  ------------------------------------------------------------------- Pulmonic valve:    Structurally normal valve.   Cusp separation was normal.  Doppler:  Transvalvular velocity was within the normal range. There was no evidence for stenosis. There was no regurgitation.  ------------------------------------------------------------------- Tricuspid valve:   Structurally normal valve.    Doppler: Transvalvular velocity was within the normal range. There was no regurgitation.  ------------------------------------------------------------------- Pulmonary artery:   The main pulmonary artery was normal-sized. Systolic pressure could not be accurately estimated.  ------------------------------------------------------------------- Right atrium:  The atrium was normal in size.  ------------------------------------------------------------------- Pericardium:  There was  no pericardial effusion.  ------------------------------------------------------------------- Systemic veins: Inferior vena cava: The vessel was normal in size. The respirophasic diameter changes were in the normal range (>= 50%), consistent with normal central venous pressure.  ------------------------------------------------------------------- Measurements   Left ventricle                         Value        Reference  LV ID, ED, PLAX chordal                43    mm     43 - 52  LV ID, ES, PLAX chordal                32    mm     23 - 38  LV fx shortening, PLAX chordal (L)     26    %      >=29  LV PW thickness, ED                    12    mm     ---------  IVS/LV PW ratio, ED                     0.92         <=1.3  LV e&', lateral                         6.53  cm/s   ---------  LV e&', medial                          7.07  cm/s   ---------  LV e&', average                         6.8   cm/s   ---------    Ventricular septum                     Value        Reference  IVS thickness, ED                      11    mm     ---------    LVOT                                   Value        Reference  LVOT ID, S                             20    mm     ---------  LVOT area                              3.14  cm^2   ---------    Aorta                                  Value        Reference  Aortic root ID, ED  35    mm     ---------    Left atrium                            Value        Reference  LA ID, A-P, ES                         40    mm     ---------  LA ID/bsa, A-P                         1.96  cm/m^2 <=2.2    Mitral valve                           Value        Reference  Mitral deceleration time               222   ms     150 - 230  Mitral E/A ratio, peak                 1.2          ---------    Systemic veins                         Value        Reference  Estimated CVP                          3     mm Hg  ---------    Right ventricle                        Value        Reference  TAPSE                                  14.2  mm     ---------  RV s&', lateral, S                      10.8  cm/s   ---------  Legend: (L)  and  (H)  mark values outside specified reference range.  ------------------------------------------------------------------- Prepared and Electronically Authenticated by  Chilton Si, MD 2018-12-10T16:51:18   Assessment / Plan:      Overall the patient is made good progress following his complicated hospital course. He is alert and talkative and his cognitive function appears intact If continue to be followed in the heart failure clinic. He has no primary care physician but was given the number for primary care  locally. Plan to see him back as necessary   Delight Ovens MD      [address] [place] [place] [address] Office [phone]   Beeper [phone]  06/22/2017 11:50 AM

## 2017-07-04 ENCOUNTER — Ambulatory Visit (HOSPITAL_COMMUNITY)
Admission: RE | Admit: 2017-07-04 | Discharge: 2017-07-04 | Disposition: A | Discharge status: Home / Self Care | Payer: Medicare Other | Source: Ambulatory Visit | Attending: Internal Medicine | Admitting: Internal Medicine

## 2017-07-04 ENCOUNTER — Encounter (HOSPITAL_COMMUNITY): Payer: Self-pay | Admitting: Internal Medicine

## 2017-07-04 ENCOUNTER — Other Ambulatory Visit: Payer: Self-pay

## 2017-07-04 VITALS — BP 140/74 | HR 85 | Wt 192.5 lb

## 2017-07-04 DIAGNOSIS — I480 Paroxysmal atrial fibrillation: Secondary | ICD-10-CM | POA: Diagnosis not present

## 2017-07-04 DIAGNOSIS — D649 Anemia, unspecified: Secondary | ICD-10-CM | POA: Insufficient documentation

## 2017-07-04 DIAGNOSIS — E872 Acidosis: Secondary | ICD-10-CM | POA: Insufficient documentation

## 2017-07-04 DIAGNOSIS — Z79899 Other long term (current) drug therapy: Secondary | ICD-10-CM | POA: Insufficient documentation

## 2017-07-04 DIAGNOSIS — I2510 Atherosclerotic heart disease of native coronary artery without angina pectoris: Secondary | ICD-10-CM

## 2017-07-04 DIAGNOSIS — J449 Chronic obstructive pulmonary disease, unspecified: Secondary | ICD-10-CM | POA: Diagnosis not present

## 2017-07-04 DIAGNOSIS — I110 Hypertensive heart disease with heart failure: Secondary | ICD-10-CM | POA: Insufficient documentation

## 2017-07-04 DIAGNOSIS — Z7982 Long term (current) use of aspirin: Secondary | ICD-10-CM | POA: Insufficient documentation

## 2017-07-04 DIAGNOSIS — R4189 Other symptoms and signs involving cognitive functions and awareness: Secondary | ICD-10-CM | POA: Insufficient documentation

## 2017-07-04 DIAGNOSIS — I238 Other current complications following acute myocardial infarction: Secondary | ICD-10-CM | POA: Diagnosis not present

## 2017-07-04 DIAGNOSIS — I253 Aneurysm of heart: Secondary | ICD-10-CM

## 2017-07-04 DIAGNOSIS — Z87891 Personal history of nicotine dependence: Secondary | ICD-10-CM | POA: Diagnosis not present

## 2017-07-04 DIAGNOSIS — I5022 Chronic systolic (congestive) heart failure: Secondary | ICD-10-CM | POA: Diagnosis not present

## 2017-07-04 DIAGNOSIS — I252 Old myocardial infarction: Secondary | ICD-10-CM | POA: Insufficient documentation

## 2017-07-04 MED ORDER — SACUBITRIL-VALSARTAN 24-26 MG PO TABS: 1.0000 | ORAL_TABLET | Freq: Two times a day (BID) | ORAL | 3 refills | Status: DC

## 2017-07-04 NOTE — Patient Instructions (Signed)
Stop Losartan  Start Entresto 24/26 mg Twice daily   Your physician recommends that you schedule a follow-up appointment in: 6 weeks with Echocardiogram

## 2017-07-04 NOTE — Progress Notes (Signed)
PCP: Primary Cardiologist: Dr Gala RomneyBensimhon. CT Surgery: Dr Gala RomneyBensimhon    HPI: [name] is 67 year old with h/o MI, HTN, COPD, anemia, CAD with DES to RCA, and LV pseudo aneurysm repair.    Admitted 04/11/17 with chest pain. EKG with evidence of inferior/lateral STEMI. Due to lethargy and hypoxemia he was intubated in the ED. Taken to cath lab with RCA DES and POBA to chronically occluded LADwith IABP placed.Had LV pseudo aneurysm repair 04/12/17. [facility] and later discharged.    Today he returns for follow up. Says he feel good. Getting stronger every day. Still living with her sister. Able to walk down the street without too much problem. No SOB or edema. Chest sore.    12/102018 ECHO  Left ventricle: The cavity size was normal. Wall thickness was   increased in a pattern of mild LVH. Systolic function was   moderately reduced. The estimated ejection fraction was in the   range of 35% to 40%. Global hypokinesis with severe hypokinesis   of the inferior, apical and mid-apical inferoseptal myocardium.   Features are consistent with a pseudonormal left ventricular   filling pattern, with concomitant abnormal relaxation and   increased filling pressure (grade 2 diastolic dysfunction).   Doppler parameters are consistent with indeterminate ventricular   filling pressure. There was an apicalthrombus. - Aortic valve: Transvalvular velocity was within the normal range.   There was no stenosis. There was no regurgitation. - Mitral valve: Transvalvular velocity was within the normal range.   There was no evidence for stenosis. There was no regurgitation. - Right ventricle: The cavity size was normal. Wall thickness was   normal. Systolic function was normal. - Atrial septum: No defect or patent foramen ovale was identified.   ROS: All systems negative except as listed in HPI, PMH and Problem List.  SH:  Social History    Socioeconomic History  . Marital status: Divorced    Spouse name: Not on file  . Number of children: Not on file  . Years of education: Not on file  . Highest education level: Not on file  Social Needs  . Financial resource strain: Not on file  . Food insecurity - worry: Not on file  . Food insecurity - inability: Not on file  . Transportation needs - medical: Not on file  . Transportation needs - non-medical: Not on file  Occupational History  . Not on file  Tobacco Use  . Smoking status: Former Smoker    Types: Cigarettes    Last attempt to quit: 04/11/2017    Years since quitting: 0.2  . Smokeless tobacco: Never Used  Substance and Sexual Activity  . Alcohol use: Not on file  . Drug use: Not on file  . Sexual activity: Not on file  Other Topics Concern  . Not on file  Social History Narrative  . Not on file    FH: No family history on file.  Past Medical History:  Diagnosis Date  . Coronary artery disease   . Hypertension     Current Outpatient Medications  Medication Sig Dispense Refill  . acetaminophen (TYLENOL) 325 MG tablet Take 1-2 tablets (325-650 mg total) by mouth every 4 (four) hours as needed for mild pain.    Marland Kitchen. amiodarone (PACERONE) 200 MG tablet Take 1 tablet (200 mg total) by mouth daily. 30 tablet 6  . aspirin 81 MG chewable tablet Place 1 tablet (81 mg total)  into feeding tube daily.    Marland Kitchen atorvastatin (LIPITOR) 80 MG tablet Take 1 tablet (80 mg total) by mouth daily at 6 PM. 30 tablet 0  . carvedilol (COREG) 3.125 MG tablet Take 1 tablet (3.125 mg total) by mouth 2 (two) times daily with a meal. 60 tablet 0  . losartan (COZAAR) 25 MG tablet Take 1 tablet (25 mg total) by mouth at bedtime. 30 tablet 6  . pantoprazole (PROTONIX) 40 MG tablet Take 1 tablet (40 mg total) by mouth daily. 30 tablet 0  . senna (SENOKOT) 8.6 MG TABS tablet Take 1 tablet (8.6 mg total) by mouth at bedtime. 120 each 0  . ticagrelor (BRILINTA) 90 MG TABS tablet Take 1 tablet  (90 mg total) by mouth 2 (two) times daily. 60 tablet 2  . triamcinolone cream (KENALOG) 0.5 % Apply topically 4 (four) times daily. Apply thin smear to rash 60 g 2   No current facility-administered medications for this encounter.     Vitals:   07/04/17 1454  BP: 140/74  Pulse: 85  SpO2: 96%  Weight: 192 lb 8 oz (87.3 kg)    PHYSICAL EXAM: General:  Well appearing. No resp difficulty HEENT: normal Neck: supple. no JVD. Carotids 2+ bilat; no bruits. No lymphadenopathy or thryomegaly appreciated. Cor: PMI nondisplaced. Regular rate & rhythm. No rubs, gallops or murmurs. Lungs: clear with mildly diminished breath sounds Abdomen: soft, nontender, nondistended. No hepatosplenomegaly. No bruits or masses. Good bowel sounds. Extremities: no cyanosis, clubbing, rash, tr edema Neuro: alert & orientedx3, cranial nerves grossly intact. moves all 4 extremities w/o difficulty. Affect pleasant    ASSESSMENT & PLAN: 1. CAD  H/O OOH anterior MI c/b LV pseudoaneurysm that was repaired on 04/12/17.  - attempted PCI of LAD failed due to chronic occlusion and extensive infarct -03/2017  s/p PCI/DES to distal RCA. - S/P LV pseudoaneurysm repair 11/14 - No s/s ischemia - Continue asa 81 mg daily.  - Continue brilinta and atorvastatin   2.Chronicsystolic HF-> h/o cardiogenic shockresolved - EF improved 25%->40%. Plan to repeat in ECHO at next visit  - NYHA II - Volume status stable on exam.  - Stop losartan. Start entresto 24/26 bid - Continue carvedilol 3.25 bid  - Echo at next visit  3. COPD with chronic CO2 retention  - Stable. No wheeze.  4. PAF - Maintaining NSR. Stop amio   5. Cognitive deficits - Much improved  Arvilla Meres  MD 3:28 PM

## 2017-07-04 NOTE — Addendum Note (Signed)
Encounter addended by: Noralee SpaceSchub, Chuckie Mccathern M, RN on: 07/04/2017 3:45 PM  Actions taken: Medication long-term status modified, Visit diagnoses modified, Order list changed, Diagnosis association updated, Sign clinical note

## 2017-07-06 ENCOUNTER — Other Ambulatory Visit (HOSPITAL_COMMUNITY): Payer: Self-pay | Admitting: *Deleted

## 2017-07-06 DIAGNOSIS — I2119 ST elevation (STEMI) myocardial infarction involving other coronary artery of inferior wall: Secondary | ICD-10-CM | POA: Diagnosis not present

## 2017-07-06 DIAGNOSIS — J449 Chronic obstructive pulmonary disease, unspecified: Secondary | ICD-10-CM | POA: Diagnosis not present

## 2017-07-06 DIAGNOSIS — I2510 Atherosclerotic heart disease of native coronary artery without angina pectoris: Secondary | ICD-10-CM | POA: Diagnosis not present

## 2017-07-06 DIAGNOSIS — I110 Hypertensive heart disease with heart failure: Secondary | ICD-10-CM | POA: Diagnosis not present

## 2017-07-06 DIAGNOSIS — G931 Anoxic brain damage, not elsewhere classified: Secondary | ICD-10-CM | POA: Diagnosis not present

## 2017-07-06 DIAGNOSIS — I5023 Acute on chronic systolic (congestive) heart failure: Secondary | ICD-10-CM | POA: Diagnosis not present

## 2017-07-07 ENCOUNTER — Telehealth (HOSPITAL_COMMUNITY): Payer: Self-pay

## 2017-07-07 ENCOUNTER — Telehealth (HOSPITAL_COMMUNITY): Payer: Self-pay | Admitting: Pharmacist

## 2017-07-07 NOTE — Telephone Encounter (Signed)
Called to follow up with patient in regards to HHPT - Patient has completed. Patient is interested in the Cardiac Rehab program but is not prepared to talk about it right now. He stated that the Dr changed his medicine and had questions about that before beginning. Patient stated he is going to call Dr.Bensimhon's office then give us a call back.

## 2017-07-07 NOTE — Telephone Encounter (Signed)
[name] called stating that he 2 different pharmacists at [facility] told him he needed to wait 48 and 72 hours between taking losartan and starting Entresto. Since losartan is an ARB, he does not need to wait to start Entresto. He can start Entresto at the time that he would have taken his next losartan dose. If he had been taking an ACEi, he would have had to wait 36 hours before starting Entresto. I have explained this to him and he is still insistent that he wants to wait at least 48 hours before starting Entresto. I have advised him that if this is the case, he needs to measure his BP daily and if it is high (>130/80 mmHg), he needs to go ahead and start Entresto before the 48 hours is up. He verbalized understanding and will call with any further issues. I have also called [facility] pharmacy and spoke with the pharmacist who stated that she did not tell him to wait any amount of time between losartan and Entresto but I reiterated to her that the 36 hour wash out period is only for ACEi.   Tyler DeisErika K. Bonnye FavaNicolsen, PharmD, BCPS, CPP Clinical Pharmacist Phone: [phone] 07/07/2017 4:33 PM

## 2017-07-10 ENCOUNTER — Telehealth (HOSPITAL_COMMUNITY): Payer: Self-pay | Admitting: Pharmacist

## 2017-07-10 NOTE — Telephone Encounter (Signed)
Entresto PA approved by [facility] Part D through 07/07/19.   Tyler DeisErika K. Bonnye FavaNicolsen, PharmD, BCPS, CPP Clinical Pharmacist Phone: [phone] 07/10/2017 11:21 AM

## 2017-07-13 ENCOUNTER — Other Ambulatory Visit (HOSPITAL_COMMUNITY): Payer: Self-pay | Admitting: *Deleted

## 2017-07-13 MED ORDER — ATORVASTATIN CALCIUM 80 MG PO TABS: 80.0000 mg | ORAL_TABLET | Freq: Every day | ORAL | 3 refills | Status: DC

## 2017-07-14 ENCOUNTER — Telehealth: Payer: Self-pay | Admitting: Physical Medicine & Rehabilitation

## 2017-07-14 NOTE — Telephone Encounter (Signed)
I spoke with [name] and explained to him that I was sorry but Dr Allena KatzPatel is his rehab physician and we are not prescribing the medications that could be causing his symptoms. I encouraged him to call his cardiologist but he says he did, and they referred him to the pharmacist who in turn referred him to the MD. [name] does not currently have a PCP and I reviewed why it is necessary to have one who oversees his general care because his other MDs are specialist. He is going to try and find one and make appt. I suggested [facility].  In the meantime if he begins having these symptoms again, he needs to go to Urgent Care or ED to be in front of an MD who can evaluate him. He understands this and will do this if necessary.

## 2017-07-14 NOTE — Telephone Encounter (Signed)
Thank you :)

## 2017-07-14 NOTE — Telephone Encounter (Signed)
Patient would like call back about his medications.  He is not sure if one or all medications is making him dizzy and have blurred vision.  Please call patient back at [phone].  Patient did contact pharmacy and he was told to call the doctors office.

## 2017-07-17 ENCOUNTER — Telehealth (HOSPITAL_COMMUNITY): Payer: Self-pay

## 2017-07-17 NOTE — Telephone Encounter (Signed)
Called to follow up with patient in regards to Cardiac Rehab - Patient stated he is trying to find a PCP on top of trying to get his  Medicine changed. He is going to follow up with Dr.Bensimhon to get his medicine changed. Will follow up again next week.

## 2017-07-21 ENCOUNTER — Encounter: Payer: Medicare Other | Attending: Physical Medicine & Rehabilitation | Admitting: Physical Medicine & Rehabilitation

## 2017-07-21 ENCOUNTER — Encounter: Payer: Self-pay | Admitting: Physical Medicine & Rehabilitation

## 2017-07-21 VITALS — BP 157/70 | HR 70

## 2017-07-21 DIAGNOSIS — G8929 Other chronic pain: Secondary | ICD-10-CM | POA: Diagnosis not present

## 2017-07-21 DIAGNOSIS — Z95811 Presence of heart assist device: Secondary | ICD-10-CM | POA: Diagnosis not present

## 2017-07-21 DIAGNOSIS — Z955 Presence of coronary angioplasty implant and graft: Secondary | ICD-10-CM | POA: Insufficient documentation

## 2017-07-21 DIAGNOSIS — I110 Hypertensive heart disease with heart failure: Secondary | ICD-10-CM | POA: Diagnosis not present

## 2017-07-21 DIAGNOSIS — Z87891 Personal history of nicotine dependence: Secondary | ICD-10-CM | POA: Diagnosis not present

## 2017-07-21 DIAGNOSIS — R200 Anesthesia of skin: Secondary | ICD-10-CM | POA: Diagnosis not present

## 2017-07-21 DIAGNOSIS — R5381 Other malaise: Secondary | ICD-10-CM | POA: Insufficient documentation

## 2017-07-21 DIAGNOSIS — I2510 Atherosclerotic heart disease of native coronary artery without angina pectoris: Secondary | ICD-10-CM | POA: Insufficient documentation

## 2017-07-21 DIAGNOSIS — I5021 Acute systolic (congestive) heart failure: Secondary | ICD-10-CM | POA: Insufficient documentation

## 2017-07-21 DIAGNOSIS — I10 Essential (primary) hypertension: Secondary | ICD-10-CM | POA: Diagnosis not present

## 2017-07-21 DIAGNOSIS — R1310 Dysphagia, unspecified: Secondary | ICD-10-CM | POA: Diagnosis not present

## 2017-07-21 DIAGNOSIS — R369 Urethral discharge, unspecified: Secondary | ICD-10-CM

## 2017-07-21 DIAGNOSIS — R269 Unspecified abnormalities of gait and mobility: Secondary | ICD-10-CM | POA: Insufficient documentation

## 2017-07-21 DIAGNOSIS — J449 Chronic obstructive pulmonary disease, unspecified: Secondary | ICD-10-CM | POA: Insufficient documentation

## 2017-07-21 DIAGNOSIS — R21 Rash and other nonspecific skin eruption: Secondary | ICD-10-CM | POA: Diagnosis not present

## 2017-07-21 DIAGNOSIS — I252 Old myocardial infarction: Secondary | ICD-10-CM | POA: Diagnosis not present

## 2017-07-21 NOTE — Addendum Note (Signed)
Addended by: Angela NevinWESSLING, Hilari Wethington D on: 07/21/2017 04:04 PM   Modules accepted: Orders

## 2017-07-21 NOTE — Progress Notes (Signed)
Subjective:    Patient ID: Ronald HoveGary Welcher, male    DOB: 20-Oct-1950, 67 y.o.   MRN: [id number]  HPI 67 y.o. male with history of CAD s/p STEMI, HTN, COPD, chronic neck pain presents for follow up for debility.  Last clinic visit 05/25/18.  Since that time, pt states he completed HH therapies and is now going to go to outpatient. He saw Cards and plan to have Echo. Swallowing issues have resolved and is now on a regular diet.  Denies falls. Now ambulating without assitive device. BP is elevated, but states it is incorrect due to injuries to his upper extremities.  He notes changes in vision and lightheadedness after eating a big buffet.  Pain Inventory Average Pain 0 Pain Right Now 0 My pain is no pain  In the last 24 hours, has pain interfered with the following? General activity 0 Relation with others 0 Enjoyment of life 0 What TIME of day is your pain at its worst? no pain Sleep (in general) Fair  Pain is worse with: no pain Pain improves with: no pain Relief from Meds: no pain  Mobility walk without assistance walk with assistance use a walker ability to climb steps?  no do you drive?  no use a wheelchair transfers alone  Function disabled: date disabled Apr 11 2017 I need assistance with the following:  meal prep, household duties and shopping  Neuro/Psych numbness dizziness  Prior Studies Any changes since last visit?  no  Physicians involved in your care Any changes since last visit?  no   History reviewed. No pertinent family history. Social History   Socioeconomic History  . Marital status: Divorced    Spouse name: None  . Number of children: None  . Years of education: None  . Highest education level: None  Social Needs  . Financial resource strain: None  . Food insecurity - worry: None  . Food insecurity - inability: None  . Transportation needs - medical: None  . Transportation needs - non-medical: None  Occupational History  . None  Tobacco  Use  . Smoking status: Former Smoker    Types: Cigarettes    Last attempt to quit: 04/11/2017    Years since quitting: 0.2  . Smokeless tobacco: Never Used  Substance and Sexual Activity  . Alcohol use: None  . Drug use: None  . Sexual activity: None  Other Topics Concern  . None  Social History Narrative  . None   Past Surgical History:  Procedure Laterality Date  . APPLICATION OF WOUND VAC N/A 04/14/2017   Procedure: APPLICATION OF WOUND VAC with reposition of Impella device;  Surgeon: Delight OvensGerhardt, Edward B, MD;  [facility];  Service: Thoracic;  Laterality: N/A;  . CORONARY STENT INTERVENTION N/A 04/11/2017   Procedure: CORONARY STENT INTERVENTION;  Surgeon: Corky CraftsVaranasi, Jayadeep S, MD;  Location: [facility] INVASIVE CV LAB;  Service: Cardiovascular;  Laterality: N/A;  . CORONARY THROMBECTOMY N/A 04/11/2017   Procedure: Coronary Thrombectomy;  Surgeon: Corky CraftsVaranasi, Jayadeep S, MD;  [facility];  Service: Cardiovascular;  Laterality: N/A;  . CORONARY/GRAFT ACUTE MI REVASCULARIZATION N/A 04/11/2017   Procedure: Coronary/Graft Acute MI Revascularization;  Surgeon: Corky CraftsVaranasi, Jayadeep S, MD;  [facility];  Service: Cardiovascular;  Laterality: N/A;  . IABP INSERTION N/A 04/11/2017   Procedure: IABP Insertion;  Surgeon: Corky CraftsVaranasi, Jayadeep S, MD;  [facility];  Service: Cardiovascular;  Laterality: N/A;  . LEFT HEART CATH AND CORONARY ANGIOGRAPHY N/A 04/11/2017  Procedure: LEFT HEART CATH AND CORONARY ANGIOGRAPHY;  Surgeon: Corky Crafts, MD;  Location: [facility] INVASIVE CV LAB;  Service: Cardiovascular;  Laterality: N/A;  . PLACEMENT OF IMPELLA LEFT VENTRICULAR ASSIST DEVICE  04/12/2017   Procedure: PLACEMENT OF IMPELLA  LD;  Surgeon: Delight Ovens, MD;  Location: [facility] OR;  Service: Open Heart Surgery;;  . REMOVAL OF IMPELLA LEFT VENTRICULAR ASSIST DEVICE N/A 04/17/2017   Procedure: REMOVAL OF IMPELLA LEFT VENTRICULAR ASSIST DEVICE;  Surgeon:  Delight Ovens, MD;  Location: [facility] OR;  Service: Open Heart Surgery;  Laterality: N/A;  . REPAIR OF RIGHT VENTRICLE LACERATION Left 04/12/2017   Procedure: Resection of LV Aneurysm;  Surgeon: Delight Ovens, MD;  Location: [facility] OR;  Service: Open Heart Surgery;  Laterality: Left;  . STERNAL CLOSURE N/A 04/14/2017   Procedure: STERNAL CLOSURE;  Surgeon: Delight Ovens, MD;  Location: [facility] OR;  Service: Thoracic;  Laterality: N/A;  . TEE WITHOUT CARDIOVERSION N/A 04/14/2017   Procedure: TRANSESOPHAGEAL ECHOCARDIOGRAM (TEE);  Surgeon: Delight Ovens, MD;  Location: [facility] OR;  Service: Thoracic;  Laterality: N/A;  . ULTRASOUND GUIDANCE FOR VASCULAR ACCESS  04/11/2017   Procedure: Ultrasound Guidance For Vascular Access;  Surgeon: Corky Crafts, MD;  Location: [facility] INVASIVE CV LAB;  Service: Cardiovascular;;   Past Medical History:  Diagnosis Date  . Coronary artery disease   . Hypertension    BP (!) 157/70 (BP Location: Left Arm, Patient Position: Sitting, Cuff Size: Normal)   Pulse 70   SpO2 97%   Opioid Risk Score:   Fall Risk Score:  `1  Depression screen PHQ 2/9  Depression screen PHQ 2/9 05/25/2017  Decreased Interest 0  Down, Depressed, Hopeless 0  PHQ - 2 Score 0  Altered sleeping 0  Tired, decreased energy 2  Change in appetite 0  Feeling bad or failure about yourself  0  Trouble concentrating 0  Moving slowly or fidgety/restless 0  Suicidal thoughts 0  PHQ-9 Score 2    Review of Systems  Constitutional: Positive for unexpected weight change.  HENT:       On thickened liquid after trach  Eyes: Positive for visual disturbance.       Occasional blurred vision  Respiratory: Negative.   Cardiovascular: Negative.   Gastrointestinal: Positive for nausea.  Endocrine: Negative.   Genitourinary: Negative.   Musculoskeletal: Negative.   Skin: Positive for rash.  Allergic/Immunologic: Negative.   Neurological: Positive for dizziness and numbness.    Hematological: Bruises/bleeds easily.       On Brilinta  All other systems reviewed and are negative.      Objective:   Physical Exam Constitutional: He appears well-developed and well-nourished. No distress.  HENT: Normocephalic and atraumatic.  Eyes: EOM are normal. No discharge.  Cardiovascular: +Murmur No JVD Respiratory: Lungs clear. Unlabored. GI: Bowel sounds are normal. He exhibits no distension.  Musculoskeletal: He exhibits no edema, no tenderness. Neurological: He is alert.  He can follow simple commands.   Motor: B/l UE 4+/5 proximal to distal (with exception of 3+/5 digits LUE) B/l LE: HF 4+/5, KE 4+/5, ADF/PF 4+/5  Skin is warm and dry. He is not diaphoretic.  Abdominal and sternal incisions c/d/i. Psychiatric: Normal mood.    Assessment & Plan:  67 y.o. male with history of CAD s/p STEMI, HTN, COPD, chronic neck pain presents for follow up for debility.  1. Debility secondary to coronary artery disease and left ventricular rupture with acute systolic heart failure  Cont therapies,  now to go to outpatient  Follow up with Cardiology  2. Acute systolic heart failure    Cont therapies  Follow up with Cards  Encouraged appropriate diet  3. Essential HTN   Follow up with Cards  Cont meds  Elevated in the office, however, pt states normal at home and inaccurate due to injuries to UE  4. Gait abnormality  Cont therapies  No assistive device needed at present  5. Penile discharge  UA/Ucx ordered

## 2017-07-22 LAB — URINALYSIS, ROUTINE W REFLEX MICROSCOPIC
BILIRUBIN UA: NEGATIVE
Glucose, UA: NEGATIVE
Ketones, UA: NEGATIVE
LEUKOCYTES UA: NEGATIVE
Nitrite, UA: NEGATIVE
PH UA: 7 (ref 5.0–7.5)
RBC, UA: NEGATIVE
Specific Gravity, UA: 1.024 (ref 1.005–1.030)
Urobilinogen, Ur: 0.2 mg/dL (ref 0.2–1.0)

## 2017-07-23 LAB — URINE CULTURE

## 2017-07-24 ENCOUNTER — Telehealth: Payer: Self-pay

## 2017-07-24 NOTE — Telephone Encounter (Signed)
We can inform him it was relatively unremarkable.  Thanks.

## 2017-07-24 NOTE — Telephone Encounter (Signed)
Patient called requesting results for recent lab work.

## 2017-07-25 NOTE — Telephone Encounter (Signed)
Called patient and relayed information

## 2017-07-26 ENCOUNTER — Ambulatory Visit: Payer: Medicare Other | Admitting: Physical Medicine & Rehabilitation

## 2017-07-28 ENCOUNTER — Encounter: Payer: Self-pay | Admitting: Family Medicine

## 2017-07-28 ENCOUNTER — Ambulatory Visit (INDEPENDENT_AMBULATORY_CARE_PROVIDER_SITE_OTHER): Payer: Medicare Other | Admitting: Family Medicine

## 2017-07-28 VITALS — BP 122/68 | HR 70 | Temp 97.8°F | Ht 67.0 in | Wt 190.0 lb

## 2017-07-28 DIAGNOSIS — I214 Non-ST elevation (NSTEMI) myocardial infarction: Secondary | ICD-10-CM

## 2017-07-28 DIAGNOSIS — I10 Essential (primary) hypertension: Secondary | ICD-10-CM | POA: Diagnosis not present

## 2017-07-28 DIAGNOSIS — H538 Other visual disturbances: Secondary | ICD-10-CM

## 2017-07-28 DIAGNOSIS — I2510 Atherosclerotic heart disease of native coronary artery without angina pectoris: Secondary | ICD-10-CM

## 2017-07-28 DIAGNOSIS — I5022 Chronic systolic (congestive) heart failure: Secondary | ICD-10-CM

## 2017-07-28 DIAGNOSIS — R7309 Other abnormal glucose: Secondary | ICD-10-CM

## 2017-07-28 DIAGNOSIS — R369 Urethral discharge, unspecified: Secondary | ICD-10-CM

## 2017-07-28 LAB — POCT URINALYSIS DIP (DEVICE)
Bilirubin Urine: NEGATIVE
GLUCOSE, UA: NEGATIVE mg/dL
Hgb urine dipstick: NEGATIVE
Ketones, ur: NEGATIVE mg/dL
LEUKOCYTES UA: NEGATIVE
NITRITE: NEGATIVE
Protein, ur: NEGATIVE mg/dL
Urobilinogen, UA: 0.2 mg/dL (ref 0.0–1.0)
pH: 5.5 (ref 5.0–8.0)

## 2017-07-28 MED ORDER — DOXYCYCLINE HYCLATE 100 MG PO CAPS: 100.0000 mg | ORAL_CAPSULE | Freq: Two times a day (BID) | ORAL | 0 refills | Status: DC

## 2017-07-28 NOTE — Patient Instructions (Addendum)
For dentist: Marolyn Hammock -[phone]  For penile discharge, while culture is pending, I will cover you with a broad-spectrum antibiotic.  Start Doxycycline 100 mg twice daily x 10 days.    You are being referred to ophthalmology for further evaluation of blurred vision and cardiac rehabilitation s/p STEMI.  If you need any medication refills please contact our office.     DASH Eating Plan DASH stands for "Dietary Approaches to Stop Hypertension." The DASH eating plan is a healthy eating plan that has been shown to reduce high blood pressure (hypertension). It may also reduce your risk for type 2 diabetes, heart disease, and stroke. The DASH eating plan may also help with weight loss. What are tips for following this plan? General guidelines  Avoid eating more than 2,300 mg (milligrams) of salt (sodium) a day. If you have hypertension, you may need to reduce your sodium intake to 1,500 mg a day.  Limit alcohol intake to no more than 1 drink a day for nonpregnant women and 2 drinks a day for men. One drink equals 12 oz of beer, 5 oz of wine, or 1 oz of hard liquor.  Work with your health care provider to maintain a healthy body weight or to lose weight. Ask what an ideal weight is for you.  Get at least 30 minutes of exercise that causes your heart to beat faster (aerobic exercise) most days of the week. Activities may include walking, swimming, or biking.  Work with your health care provider or diet and nutrition specialist (dietitian) to adjust your eating plan to your individual calorie needs. Reading food labels  Check food labels for the amount of sodium per serving. Choose foods with less than 5 percent of the Daily Value of sodium. Generally, foods with less than 300 mg of sodium per serving fit into this eating plan.  To find whole grains, look for the word "whole" as the first word in the ingredient list. Shopping  Buy products labeled as "low-sodium" or "no salt  added."  Buy fresh foods. Avoid canned foods and premade or frozen meals. Cooking  Avoid adding salt when cooking. Use salt-free seasonings or herbs instead of table salt or sea salt. Check with your health care provider or pharmacist before using salt substitutes.  Do not fry foods. Cook foods using healthy methods such as baking, boiling, grilling, and broiling instead.  Cook with heart-healthy oils, such as olive, canola, soybean, or sunflower oil. Meal planning   Eat a balanced diet that includes: ? 5 or more servings of fruits and vegetables each day. At each meal, try to fill half of your plate with fruits and vegetables. ? Up to 6-8 servings of whole grains each day. ? Less than 6 oz of lean meat, poultry, or fish each day. A 3-oz serving of meat is about the same size as a deck of cards. One egg equals 1 oz. ? 2 servings of low-fat dairy each day. ? A serving of nuts, seeds, or beans 5 times each week. ? Heart-healthy fats. Healthy fats called Omega-3 fatty acids are found in foods such as flaxseeds and coldwater fish, like sardines, salmon, and mackerel.  Limit how much you eat of the following: ? Canned or prepackaged foods. ? Food that is high in trans fat, such as fried foods. ? Food that is high in saturated fat, such as fatty meat. ? Sweets, desserts, sugary drinks, and other foods with added sugar. ? Full-fat dairy products.  Do  not salt foods before eating.  Try to eat at least 2 vegetarian meals each week.  Eat more home-cooked food and less restaurant, buffet, and fast food.  When eating at a restaurant, ask that your food be prepared with less salt or no salt, if possible. What foods are recommended? The items listed may not be a complete list. Talk with your dietitian about what dietary choices are best for you. Grains Whole-grain or whole-wheat bread. Whole-grain or whole-wheat pasta. Brown rice. [place] Morrow. Bulgur. Whole-grain and low-sodium cereals.  Pita bread. Low-fat, low-sodium crackers. Whole-wheat flour tortillas. Vegetables Fresh or frozen vegetables (raw, steamed, roasted, or grilled). Low-sodium or reduced-sodium tomato and vegetable juice. Low-sodium or reduced-sodium tomato sauce and tomato paste. Low-sodium or reduced-sodium canned vegetables. Fruits All fresh, dried, or frozen fruit. Canned fruit in natural juice (without added sugar). Meat and other protein foods Skinless chicken or [place]. Ground chicken or [place]. Pork with fat trimmed off. Fish and seafood. Egg whites. Dried beans, peas, or lentils. Unsalted nuts, nut butters, and seeds. Unsalted canned beans. Lean cuts of beef with fat trimmed off. Low-sodium, lean deli meat. Dairy Low-fat (1%) or fat-free (skim) milk. Fat-free, low-fat, or reduced-fat cheeses. Nonfat, low-sodium ricotta or cottage cheese. Low-fat or nonfat yogurt. Low-fat, low-sodium cheese. Fats and oils Soft margarine without trans fats. Vegetable oil. Low-fat, reduced-fat, or light mayonnaise and salad dressings (reduced-sodium). Canola, safflower, olive, soybean, and sunflower oils. Avocado. Seasoning and other foods Herbs. Spices. Seasoning mixes without salt. Unsalted popcorn and pretzels. Fat-free sweets. What foods are not recommended? The items listed may not be a complete list. Talk with your dietitian about what dietary choices are best for you. Grains Baked goods made with fat, such as croissants, muffins, or some breads. Dry pasta or rice meal packs. Vegetables Creamed or fried vegetables. Vegetables in a cheese sauce. Regular canned vegetables (not low-sodium or reduced-sodium). Regular canned tomato sauce and paste (not low-sodium or reduced-sodium). Regular tomato and vegetable juice (not low-sodium or reduced-sodium). Angie Fava. Olives. Fruits Canned fruit in a light or heavy syrup. Fried fruit. Fruit in cream or butter sauce. Meat and other protein foods Fatty cuts of meat. Ribs. Fried  meat. Berniece Salines. Sausage. Bologna and other processed lunch meats. Salami. Fatback. Hotdogs. Bratwurst. Salted nuts and seeds. Canned beans with added salt. Canned or smoked fish. Whole eggs or egg yolks. Chicken or [place] with skin. Dairy Whole or 2% milk, cream, and half-and-half. Whole or full-fat cream cheese. Whole-fat or sweetened yogurt. Full-fat cheese. Nondairy creamers. Whipped toppings. Processed cheese and cheese spreads. Fats and oils Butter. Stick margarine. Lard. Shortening. Ghee. Bacon fat. Tropical oils, such as coconut, palm kernel, or palm oil. Seasoning and other foods Salted popcorn and pretzels. Onion salt, garlic salt, seasoned salt, table salt, and sea salt. Worcestershire sauce. Tartar sauce. Barbecue sauce. Teriyaki sauce. Soy sauce, including reduced-sodium. Steak sauce. Canned and packaged gravies. Fish sauce. Oyster sauce. Cocktail sauce. Horseradish that you find on the shelf. Ketchup. Mustard. Meat flavorings and tenderizers. Bouillon cubes. Hot sauce and Tabasco sauce. Premade or packaged marinades. Premade or packaged taco seasonings. Relishes. Regular salad dressings. Where to find more information:  National Heart, Lung, and [place]: https://wilson-eaton.com/  American Heart Association: www.heart.org Summary  The DASH eating plan is a healthy eating plan that has been shown to reduce high blood pressure (hypertension). It may also reduce your risk for type 2 diabetes, heart disease, and stroke.  With the DASH eating plan, you should limit salt (sodium) intake  to 2,300 mg a day. If you have hypertension, you may need to reduce your sodium intake to 1,500 mg a day.  When on the DASH eating plan, aim to eat more fresh fruits and vegetables, whole grains, lean proteins, low-fat dairy, and heart-healthy fats.  Work with your health care provider or diet and nutrition specialist (dietitian) to adjust your eating plan to your individual calorie needs. This information is  not intended to replace advice given to you by your health care provider. Make sure you discuss any questions you have with your health care provider. Document Released: 05/05/2011 Document Revised: 05/09/2016 Document Reviewed: 05/09/2016 Elsevier Interactive Patient Education  [employer]2018 Elsevier Inc.

## 2017-07-28 NOTE — Progress Notes (Signed)
Patient ID: [name], male    DOB: [birth year], 67 y.o.   MRN: [id number]  PCP: Bing NeighborsHarris, Muneer Leider S, FNP  Chief Complaint  Patient presents with  . Establish Care  . Hospitalization Follow-up    Subjective:  HPI Ronald HoveGary [name] is a 67 y.o. male with STEMI, COPD, CHF, presents to establish care and hospital follow-up. [name] suffered from a STEMI in November 2018 and only today has established with a primary care provider. Reports that he only recently received health insurance which has been the cause for delay in establishing care. [facility], Dr. Gala RomneyBensimhon. He suffers from CHF and denies dyspnea, BLE edema, PND, abdominal distension,or cough. Last echocardiogram 04/2017 significant for an EF 35-40%. Denies angina symptoms. Complains of persistent fatigue and desires to be referred to cardiac rehabilitation which was not previously initiated due to lack of health insurance coverage. [name] complains of blurring of vision which has remained persistent s/p open heart surgery. He did suffer from an anoxic brain injury secondary to cardiogenic shock and respiratory failure during hospitalization in November. Denies any known history of eye disease and unknown of last eye exam.  Complains of yellow discharge draining intermittently from penis. Concern for infection. He had a catheter in place for greater than 1 month. Complains of associated testicular swelling and dysuria. Denies fever, chills, recent sexual partner, or hematuria. Social History   Socioeconomic History  . Marital status: Divorced    Spouse name: Not on file  . Number of children: Not on file  . Years of education: Not on file  . Highest education level: Not on file  Social Needs  . Financial resource strain: Not on file  . Food insecurity - worry: Not on file  . Food insecurity - inability: Not on file  . Transportation needs - medical: Not on file  . Transportation needs - non-medical: Not on file   Occupational History  . Not on file  Tobacco Use  . Smoking status: Former Smoker    Types: Cigarettes    Last attempt to quit: 04/11/2017    Years since quitting: 0.2  . Smokeless tobacco: Never Used  Substance and Sexual Activity  . Alcohol use: Not on file  . Drug use: Not on file  . Sexual activity: Not on file  Other Topics Concern  . Not on file  Social History Narrative  . Not on file    Family History  Problem Relation Age of Onset  . Esophageal cancer Mother    Review of Systems  Constitutional: Negative for fever, chills, diaphoresis, activity change, appetite change and positive for fatigue. HENT: Negative for ear pain, nosebleeds, congestion, facial swelling, rhinorrhea, neck pain, neck stiffness and ear discharge.  Eyes: Negative for pain, discharge, redness, itching and positive for visual disturbance. Respiratory: Negative for cough, choking, chest tightness, shortness of breath, wheezing and stridor.  Cardiovascular: Negative for chest pain, palpitations and leg swelling. Gastrointestinal: Negative for abdominal distention. Genitourinary: Positive for dysuria, positive penile drainage, positive for testicular and penile pain.  Musculoskeletal: Negative for back pain, joint swelling, arthralgia and gait problem. Neurological: Negative for dizziness, tremors, seizures, syncope, facial asymmetry, speech difficulty, weakness, light-headedness, numbness and headaches.  Hematological: Negative for adenopathy. Does not bruise/bleed easily. Psychiatric/Behavioral: Negative for hallucinations, behavioral problems, confusion, dysphoric mood, decreased concentration and agitation.  Patient Active Problem List   Diagnosis Date Noted  . Rash 05/15/2017  . Sore throat   . Essential hypertension   .  Hypotension due to drugs   . Labile blood pressure   . Supplemental oxygen dependent   . Confusion   . Hyperglycemia   . Hypokalemia   . Anoxic brain injury (HCC)   .  Chronic systolic (congestive) heart failure (HCC)   . CAD in native artery   . Physical debility 04/27/2017  . 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 Acute respiratory failure with hypoxia (HCC)   . Hypoxemia   . Congestive heart failure (CHF) (HCC)   . Dysphagia   . Bacteremia   . Benign essential HTN   . Chronic neck pain   . Sepsis (HCC)   . Acute blood loss anemia   . Tachypnea   . Leukocytosis   . Fever   . Cardiogenic shock (HCC)   . Aneurysm of left ventricle of heart 04/12/2017  . Acute anterior wall MI (HCC)   . Acute pulmonary edema (HCC)   . Acute hypoxemic respiratory failure (HCC)   . ACS (acute coronary syndrome) (HCC)   . Acute heart failure (HCC)   . Encounter for central line placement   . Encounter for management of intra-aortic balloon pump   . STEMI (ST elevation myocardial infarction) (HCC) 04/11/2017  . COPD (chronic obstructive pulmonary disease) (HCC) 04/11/2017  . CHF (congestive heart failure), NYHA class II, acute, combined (HCC) 04/11/2017    No Known Allergies  Prior to Admission medications   Medication Sig Start Date End Date Taking? Authorizing Provider  acetaminophen (TYLENOL) 325 MG tablet Take 1-2 tablets (325-650 mg total) by mouth every 4 (four) hours as needed for mild pain. 05/03/17  Yes Love, Evlyn Kanner, PA-C  amiodarone (PACERONE) 200 MG tablet Take 1 tablet (200 mg total) by mouth daily. 06/11/17  Yes Berton Bon, NP  aspirin 81 MG chewable tablet Place 1 tablet (81 mg total) into feeding tube daily. 06/02/17  Yes Clegg, Amy D, NP  atorvastatin (LIPITOR) 80 MG tablet Take 1 tablet (80 mg total) by mouth daily at 6 PM. 07/13/17  Yes Bensimhon, Bevelyn Buckles, MD  sacubitril-valsartan (ENTRESTO) 24-26 MG Take 1 tablet by mouth 2 (two) times daily. 07/04/17  Yes Bensimhon, Bevelyn Buckles, MD  senna (SENOKOT) 8.6 MG TABS tablet Take 1 tablet (8.6 mg total) by mouth at bedtime. 05/12/17  Yes Love, Evlyn Kanner, PA-C   ticagrelor (BRILINTA) 90 MG TABS tablet Take 1 tablet (90 mg total) by mouth 2 (two) times daily. 06/11/17  Yes Berton Bon, NP  triamcinolone cream (KENALOG) 0.5 % Apply topically 4 (four) times daily. Apply thin smear to rash 05/12/17  Yes Love, Evlyn Kanner, PA-C    Past Medical, Surgical Family and Social History reviewed and updated.    Objective:   Today's Vitals   07/28/17 0934  BP: 122/68  Pulse: 70  Temp: 97.8 F (36.6 C)  TempSrc: Oral  SpO2: 98%  Weight: 190 lb (86.2 kg)  Height: 5\' 7"  (1.702 m)    Wt Readings from Last 3 Encounters:  07/28/17 190 lb (86.2 kg)  07/04/17 192 lb 8 oz (87.3 kg)  06/22/17 189 lb (85.7 kg)   Physical Exam Constitutional: Patient appears well-developed and well-nourished. No distress. HENT: Normocephalic, atraumatic, External right and left ear normal. Oropharynx is clear and moist.  Eyes: Conjunctivae and EOM are normal. PERRLA, no scleral icterus. Neck: Normal ROM. Neck supple. No JVD. No tracheal deviation. No thyromegaly. CVS: RRR, S1/S2 +, no murmurs, no gallops, no carotid bruit.  Pulmonary: Effort  and breath sounds normal, no stridor, rhonchi, wheezes, rales.  Abdominal: Soft. BS +, no distension, tenderness, rebound or guarding.  Musculoskeletal: Normal range of motion. No edema and no tenderness.  Lymphadenopathy: No lymphadenopathy noted, cervical, inguinal or axillary Neuro: Alert. Normal reflexes, muscle tone coordination, normal gait. Skin: Skin is warm and dry. No rash noted. Not diaphoretic. No erythema. No pallor. Psychiatric: Normal mood and affect. Behavior, judgment, thought content normal.   Assessment & Plan:  1. Essential hypertension, well-controlled., We have discussed target BP range and blood pressure goal. I have advised patient to check BP regularly and to call us back or report to clinic if the numbers are consistently higher than 140/90. We discussed the importance of compliance with medical therapy and DASH  diet recommended, consequences of uncontrolled hypertension discussed.  - Continue current BP medications, Check Thyroid Panel With TSH, Comprehensive metabolic panel  2. Chronic systolic (congestive) heart failure (HCC), asymptomatic today. Continue management by cardiology. -Limit fluid intake to no more than 1500 ml per day.-If you experiencing greater than 3 lb weight gain within 24 hours, notify our clinic or if after hours report to the Emergency Department. -It is important to keep your blood pressure under control. Monitor blood pressure at home and if you obtain readings greater than 150/90 consecutively over a period of 3 days, notify me here at the office. -Avoid adding table salt or eating food such as can soup or frozen meals which are high in sodium. This increases fluid retention and is likely to worsen your heart failure.  -Read the educational information below thoroughly to aid you in management of your heart failure symptoms.     3. STEMI (ST elevated myocardial infarction) (HCC),  Fasting lipid panel pending. Continue management by cardiology.  4. Blurred vision, referring to [facility],  - Ambulatory referral to Ophthalmology  5. Penile discharge, exam unremarkable, although patient is symptomatic. Will obtain a wound culture an urethral swab to evaluate for the presence of bacteria. Will treat empirically for catheter-associated infection and will trial doxycycline x 10 days.    6. Elevated glucose, A1C today 5.5, normal. Repeat in 12 months.    Meds ordered this encounter  Medications  . doxycycline (VIBRAMYCIN) 100 MG capsule    Sig: Take 1 capsule (100 mg total) by mouth 2 (two) times daily.    Dispense:  20 capsule    Refill:  0    Order Specific Question:   Supervising Provider    Answer:   Quentin Angst [id number]    RTC:  3 months for chronic conditions.    Godfrey Pick. Tiburcio Pea, MSN, FNP-C [facility]  [address] [place], [place] [address] [phone]

## 2017-07-29 LAB — COMPREHENSIVE METABOLIC PANEL
A/G RATIO: 1.3 (ref 1.2–2.2)
ALT: 16 IU/L (ref 0–44)
AST: 14 IU/L (ref 0–40)
Albumin: 3.9 g/dL (ref 3.6–4.8)
Alkaline Phosphatase: 88 IU/L (ref 39–117)
BUN/Creatinine Ratio: 10 (ref 10–24)
BUN: 10 mg/dL (ref 8–27)
Bilirubin Total: 0.4 mg/dL (ref 0.0–1.2)
CALCIUM: 9.2 mg/dL (ref 8.6–10.2)
CO2: 23 mmol/L (ref 20–29)
Chloride: 107 mmol/L — ABNORMAL HIGH (ref 96–106)
Creatinine, Ser: 1.05 mg/dL (ref 0.76–1.27)
GFR calc Af Amer: 84 mL/min/{1.73_m2} (ref >59)
GFR calc non Af Amer: 73 mL/min/{1.73_m2} (ref >59)
GLUCOSE: 87 mg/dL (ref 65–99)
Globulin, Total: 3 g/dL (ref 1.5–4.5)
POTASSIUM: 4.6 mmol/L (ref 3.5–5.2)
Sodium: 142 mmol/L (ref 134–144)
Total Protein: 6.9 g/dL (ref 6.0–8.5)

## 2017-07-29 LAB — CBC
HEMATOCRIT: 36.6 % — AB (ref 37.5–51.0)
HEMOGLOBIN: 11.5 g/dL — AB (ref 13.0–17.7)
MCH: 26.7 pg (ref 26.6–33.0)
MCHC: 31.4 g/dL — ABNORMAL LOW (ref 31.5–35.7)
MCV: 85 fL (ref 79–97)
Platelets: 267 10*3/uL (ref 150–379)
RBC: 4.3 x10E6/uL (ref 4.14–5.80)
RDW: 15.9 % — ABNORMAL HIGH (ref 12.3–15.4)
WBC: 7.9 10*3/uL (ref 3.4–10.8)

## 2017-07-29 LAB — LIPID PANEL
CHOLESTEROL TOTAL: 99 mg/dL — AB (ref 100–199)
Chol/HDL Ratio: 3 ratio (ref 0.0–5.0)
HDL: 33 mg/dL — AB (ref >39)
LDL Calculated: 55 mg/dL (ref 0–99)
TRIGLYCERIDES: 56 mg/dL (ref 0–149)
VLDL Cholesterol Cal: 11 mg/dL (ref 5–40)

## 2017-07-29 LAB — THYROID PANEL WITH TSH
FREE THYROXINE INDEX: 2.4 (ref 1.2–4.9)
T3 Uptake Ratio: 26 % (ref 24–39)
T4, Total: 9.4 ug/dL (ref 4.5–12.0)
TSH: 2.31 u[IU]/mL (ref 0.450–4.500)

## 2017-07-29 LAB — HEMOGLOBIN A1C
ESTIMATED AVERAGE GLUCOSE: 111 mg/dL
HEMOGLOBIN A1C: 5.5 % (ref 4.8–5.6)

## 2017-07-30 ENCOUNTER — Encounter: Payer: Self-pay | Admitting: Family Medicine

## 2017-07-30 NOTE — Progress Notes (Signed)
Please mail lab letter  

## 2017-07-31 LAB — NUSWAB VAGINITIS PLUS (VG+)
CHLAMYDIA TRACHOMATIS, NAA: NEGATIVE
Candida albicans, NAA: NEGATIVE
Candida glabrata, NAA: NEGATIVE
Neisseria gonorrhoeae, NAA: NEGATIVE
Trich vag by NAA: NEGATIVE

## 2017-08-02 ENCOUNTER — Telehealth (HOSPITAL_COMMUNITY): Payer: Self-pay

## 2017-08-02 NOTE — Telephone Encounter (Signed)
Called to follow up with patient in regards to Cardiac Rehab - Patient has gotten everything situated. Scheduled orientation on 09/19/2017 at 1:30pm. Patient will attend the 1:15pm exc class. Mailed packet.

## 2017-08-10 ENCOUNTER — Telehealth: Payer: Self-pay

## 2017-08-10 ENCOUNTER — Ambulatory Visit (INDEPENDENT_AMBULATORY_CARE_PROVIDER_SITE_OTHER): Payer: Medicare Other | Admitting: Physician Assistant

## 2017-08-10 NOTE — Telephone Encounter (Signed)
[name] contact patient to remind him that he reported vision problems during his last visit and I told him that I would refer him for an eye exam since he had not had one previously. He has an anoxic brain injury therefore requires redirecting.

## 2017-08-15 NOTE — Telephone Encounter (Signed)
Patient notified and states that he will call [facility] to schedule appointment

## 2017-08-18 ENCOUNTER — Ambulatory Visit: Payer: Medicare Other | Admitting: Family Medicine

## 2017-08-22 ENCOUNTER — Ambulatory Visit (HOSPITAL_COMMUNITY): Payer: Medicare Other

## 2017-08-22 ENCOUNTER — Ambulatory Visit (HOSPITAL_BASED_OUTPATIENT_CLINIC_OR_DEPARTMENT_OTHER)
Admission: RE | Admit: 2017-08-22 | Discharge: 2017-08-22 | Disposition: A | Discharge status: Home / Self Care | Payer: Medicare Other | Source: Ambulatory Visit | Attending: Internal Medicine | Admitting: Internal Medicine

## 2017-08-22 ENCOUNTER — Encounter (HOSPITAL_COMMUNITY): Payer: Self-pay | Admitting: Internal Medicine

## 2017-08-22 ENCOUNTER — Ambulatory Visit (HOSPITAL_COMMUNITY)
Admission: RE | Admit: 2017-08-22 | Discharge: 2017-08-22 | Disposition: A | Discharge status: Home / Self Care | Payer: Medicare Other | Source: Ambulatory Visit | Attending: Internal Medicine | Admitting: Internal Medicine

## 2017-08-22 VITALS — BP 142/70 | HR 80 | Wt 188.5 lb

## 2017-08-22 DIAGNOSIS — I253 Aneurysm of heart: Secondary | ICD-10-CM | POA: Diagnosis not present

## 2017-08-22 DIAGNOSIS — I059 Rheumatic mitral valve disease, unspecified: Secondary | ICD-10-CM | POA: Insufficient documentation

## 2017-08-22 DIAGNOSIS — I2510 Atherosclerotic heart disease of native coronary artery without angina pectoris: Secondary | ICD-10-CM | POA: Insufficient documentation

## 2017-08-22 DIAGNOSIS — I110 Hypertensive heart disease with heart failure: Secondary | ICD-10-CM | POA: Diagnosis not present

## 2017-08-22 DIAGNOSIS — I252 Old myocardial infarction: Secondary | ICD-10-CM | POA: Diagnosis not present

## 2017-08-22 DIAGNOSIS — I238 Other current complications following acute myocardial infarction: Secondary | ICD-10-CM

## 2017-08-22 DIAGNOSIS — Z951 Presence of aortocoronary bypass graft: Secondary | ICD-10-CM | POA: Diagnosis not present

## 2017-08-22 DIAGNOSIS — I5022 Chronic systolic (congestive) heart failure: Secondary | ICD-10-CM | POA: Diagnosis not present

## 2017-08-22 DIAGNOSIS — J449 Chronic obstructive pulmonary disease, unspecified: Secondary | ICD-10-CM | POA: Insufficient documentation

## 2017-08-22 MED ORDER — CARVEDILOL 3.125 MG PO TABS: 3.1250 mg | ORAL_TABLET | Freq: Two times a day (BID) | ORAL | 3 refills | Status: DC

## 2017-08-22 NOTE — Addendum Note (Signed)
Encounter addended by: Noralee SpaceSchub, Cornie Mccomber M, RN on: 08/22/2017 3:01 PM  Actions taken: Pharmacy for encounter modified, Order list changed, Diagnosis association updated, Sign clinical note

## 2017-08-22 NOTE — Progress Notes (Signed)
PCP: Primary Cardiologist: Dr Gala Romney. CT Surgery: Dr Gala Romney    HPI: Mr Bulnes is 67 year old with h/o MI, HTN, COPD, anemia, CAD with DES to RCA, and LV pseudoaneurysm repair.    Admitted 04/11/17 with chest pain. EKG with evidence of inferior/lateral STEMI. Due to lethargy and hypoxemia he was intubated in the ED. Taken to cath lab with RCA DES and POBA to chronically occluded LADwith IABP placed.Had LV pseudo aneurysm repair 04/12/17. Once stable he transferred to [facility] for intensive rehab on 11/29 and later discharged.    Returns for routine f/u. Says he feels better and better. Goes to [place] and walks around. Denies SOB. Says chest sore a little bit but no angina. No palpitations, edema or PND. No problems with medicines. Taking BP at home SBP 120-135  Echo today limited images but EF 55%. Personally reviewed   12/102018 ECHO  Left ventricle: The cavity size was normal. Wall thickness was   increased in a pattern of mild LVH. Systolic function was   moderately reduced. The estimated ejection fraction was in the   range of 35% to 40%. Global hypokinesis with severe hypokinesis   of the inferior, apical and mid-apical inferoseptal myocardium.   Features are consistent with a pseudonormal left ventricular   filling pattern, with concomitant abnormal relaxation and   increased filling pressure (grade 2 diastolic dysfunction).   Doppler parameters are consistent with indeterminate ventricular   filling pressure. There was an apicalthrombus. - Aortic valve: Transvalvular velocity was within the normal range.   There was no stenosis. There was no regurgitation. - Mitral valve: Transvalvular velocity was within the normal range.   There was no evidence for stenosis. There was no regurgitation. - Right ventricle: The cavity size was normal. Wall thickness was   normal. Systolic function was normal. - Atrial septum: No defect or patent foramen ovale was identified.   ROS: All  systems negative except as listed in HPI, PMH and Problem List.  SH:  Social History   Socioeconomic History  . Marital status: Divorced    Spouse name: Not on file  . Number of children: Not on file  . Years of education: Not on file  . Highest education level: Not on file  Occupational History  . Not on file  Social Needs  . Financial resource strain: Not on file  . Food insecurity:    Worry: Not on file    Inability: Not on file  . Transportation needs:    Medical: Not on file    Non-medical: Not on file  Tobacco Use  . Smoking status: Former Smoker    Types: Cigarettes    Last attempt to quit: 04/11/2017    Years since quitting: 0.3  . Smokeless tobacco: Never Used  Substance and Sexual Activity  . Alcohol use: Not on file  . Drug use: Not on file  . Sexual activity: Not on file  Lifestyle  . Physical activity:    Days per week: Not on file    Minutes per session: Not on file  . Stress: Not on file  Relationships  . Social connections:    Talks on phone: Not on file    Gets together: Not on file    Attends religious service: Not on file    Active member of club or organization: Not on file    Attends meetings of clubs or organizations: Not on file    Relationship status: Not on file  . Intimate partner  violence:    Fear of current or ex partner: Not on file    Emotionally abused: Not on file    Physically abused: Not on file    Forced sexual activity: Not on file  Other Topics Concern  . Not on file  Social History Narrative  . Not on file    FH:  Family History  Problem Relation Age of Onset  . Esophageal cancer Mother     Past Medical History:  Diagnosis Date  . Coronary artery disease   . Hypertension     Current Outpatient Medications  Medication Sig Dispense Refill  . acetaminophen (TYLENOL) 325 MG tablet Take 1-2 tablets (325-650 mg total) by mouth every 4 (four) hours as needed for mild pain.    Marland Kitchen. amiodarone (PACERONE) 200 MG tablet  Take 200 mg by mouth 2 (two) times daily.    Marland Kitchen. aspirin 81 MG chewable tablet Place 1 tablet (81 mg total) into feeding tube daily.    Marland Kitchen. atorvastatin (LIPITOR) 80 MG tablet Take 1 tablet (80 mg total) by mouth daily at 6 PM. 90 tablet 3  . sacubitril-valsartan (ENTRESTO) 24-26 MG Take 1 tablet by mouth 2 (two) times daily. 60 tablet 3  . senna (SENOKOT) 8.6 MG TABS tablet Take 1 tablet (8.6 mg total) by mouth at bedtime. 120 each 0  . ticagrelor (BRILINTA) 90 MG TABS tablet Take 1 tablet (90 mg total) by mouth 2 (two) times daily. 60 tablet 2  . triamcinolone cream (KENALOG) 0.5 % Apply topically 4 (four) times daily. Apply thin smear to rash 60 g 2   No current facility-administered medications for this encounter.     Vitals:   08/22/17 1414  BP: (!) 142/70  Pulse: 80  SpO2: 99%  Weight: 188 lb 8 oz (85.5 kg)    PHYSICAL EXAM: General:  Well appearing. No resp difficulty HEENT: normal Neck: supple. no JVD. Carotids 2+ bilat; no bruits. No lymphadenopathy or thryomegaly appreciated. Cor: PMI nondisplaced. Regular rate & rhythm. No rubs, gallops or murmurs. Lungs: clear Abdomen: soft, nontender, nondistended. No hepatosplenomegaly. No bruits or masses. Good bowel sounds. Extremities: no cyanosis, clubbing, rash, edema Neuro: alert & orientedx3, cranial nerves grossly intact. moves all 4 extremities w/o difficulty. Affect pleasant   ASSESSMENT & PLAN: 1. CAD with h/o OOH anterior MI c/b LV pseudoaneurysm that was repaired on 04/12/17.  - attempted PCI of LAD failed due to chronic occlusion and extensive infarct -03/2017  s/p PCI/DES to distal RCA. - S/P LV pseudoaneurysm repair 11/14 - Recovering well. No s/s ischemia - Will start CR on 09/25/17 - Continue asa 81 mg daily.  - Continue Brilinta x 1 year - Continue atorvastatin - Start carvedilol 3.125 bid  2.Chronicsystolic HF-> h/o cardiogenic shockresolved - Echo today reviewed personally. EF improved 25%->40%-> 55% - NYHA  II - Volume status looks good.  - Continue entresto 24/26 bid - Increase carvedilol 6.25 bid   3. COPD with chronic CO2 retention  - Stable. No wheeze.  4. PAF - Maintaining NSR. Stob amiodarone  5. Cognitive deficits - Much improved  [facility]/u in 6 months.   [name] Ronald Matson  MD 2:41 PM

## 2017-08-22 NOTE — Patient Instructions (Signed)
Stop Amiodarone  Start Carvedilol 3.125 mg Twice daily   You have been referred to follow up with [facility] at: [address] [place] [address] [phone]  They will call you to schedule your appointment

## 2017-08-22 NOTE — Progress Notes (Signed)
  Echocardiogram 2D Echocardiogram definity has been performed.  Leta JunglingCooper, Crit Obremski M 08/22/2017, 2:17 PM

## 2017-08-31 ENCOUNTER — Ambulatory Visit: Payer: Medicare Other | Admitting: Cardiology

## 2017-09-11 ENCOUNTER — Other Ambulatory Visit (HOSPITAL_COMMUNITY): Payer: Self-pay | Admitting: *Deleted

## 2017-09-11 MED ORDER — TICAGRELOR 90 MG PO TABS: 90.0000 mg | ORAL_TABLET | Freq: Two times a day (BID) | ORAL | 2 refills | Status: DC

## 2017-09-12 ENCOUNTER — Telehealth: Payer: Self-pay

## 2017-09-12 NOTE — Telephone Encounter (Signed)
Patient called requesting a renewing of his handicap placard.

## 2017-09-12 NOTE — Telephone Encounter (Signed)
He should be scheduled to see me next week. We can discuss it then.  Thanks.

## 2017-09-13 NOTE — Telephone Encounter (Signed)
Contacted patient and conveyed that patient has upcoming appointment. That will be the best time to discuss.  Patient verbalized understanding

## 2017-09-14 ENCOUNTER — Telehealth (HOSPITAL_COMMUNITY): Payer: Self-pay | Admitting: Pharmacist

## 2017-09-14 NOTE — Telephone Encounter (Signed)
Cardiac Rehab Medication Review by a Pharmacist  Does the patient feel that his/her medications are working for him/her?  yes  Has the patient been experiencing any side effects to the medications prescribed?  yes - blurry vision and dizziness "after eating,", bruising, nosebleeds  Does the patient measure his/her own blood pressure or blood glucose at home?  no - says he wants to get a blood pressure cuff  Does the patient have any problems obtaining medications due to transportation or finances?   no  Understanding of regimen: fair Understanding of indications: fair Potential of compliance: fair  Pharmacist comments: Patient presenting for cardiac rehab orientation. He keeps a list of his medications and is able to recognize and confirm or deny each medication, as well as add those that I did not mention. He mentioned that he sometimes takes his sister's Tylenol and docusate stool softeners - unclear if these are OTC or Rx, but advised him that he should not be sharing prescription medications. He complains of dizziness and blurred vision often after he eats - unsure why this would be related to eating, but mentioned it may be due to low blood pressure and recommended that he monitor this at home. Also complains of easy bruising and mild nosebleeds. Explained increased bleeding risk on ASA and Brilinta, recommended speaking to his physician about switching Brilinta to Plavix if possible.  Roderic ScarceErin N. Zigmund Danieleja, PharmD PGY1 Pharmacy Resident Pager: [phone] 09/14/2017 2:58 PM

## 2017-09-19 ENCOUNTER — Encounter (HOSPITAL_COMMUNITY)
Admission: RE | Admit: 2017-09-19 | Discharge: 2017-09-19 | Disposition: A | Discharge status: Home / Self Care | Payer: Medicare HMO | Source: Ambulatory Visit | Attending: Internal Medicine | Admitting: Internal Medicine

## 2017-09-19 ENCOUNTER — Ambulatory Visit (HOSPITAL_COMMUNITY)
Admission: RE | Admit: 2017-09-19 | Discharge: 2017-09-19 | Disposition: A | Discharge status: Home / Self Care | Payer: Medicare HMO | Source: Ambulatory Visit | Attending: Family Medicine | Admitting: Family Medicine

## 2017-09-19 ENCOUNTER — Encounter (HOSPITAL_COMMUNITY): Payer: Self-pay

## 2017-09-19 VITALS — Ht 66.5 in | Wt 182.3 lb

## 2017-09-19 DIAGNOSIS — Z955 Presence of coronary angioplasty implant and graft: Secondary | ICD-10-CM | POA: Diagnosis present

## 2017-09-19 DIAGNOSIS — I213 ST elevation (STEMI) myocardial infarction of unspecified site: Secondary | ICD-10-CM | POA: Insufficient documentation

## 2017-09-19 DIAGNOSIS — R079 Chest pain, unspecified: Secondary | ICD-10-CM | POA: Insufficient documentation

## 2017-09-19 NOTE — Progress Notes (Signed)
OUTPATIENT CARDIAC REHAB  PMH:  04/11/2017 STEMI, DES RCA  Primary Cardiologist: Dr. Gala RomneyBensimhon  Pt c/o chest pain immediately following 6minute walk test at cardaic rehab.  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  Pt describes chest pain as sharp, "gas like".  Pt rates 1/10.  Pain resolved spontaneously. 12 lead EKG obtained.  Heart Failure clinic notified.  No new orders received. Pt instructed to present to ED for unrelieved chest pain.  Pt verbalized understanding.  Deveron FurlongJoann Tuere Nwosu, RN, BSN Cardiac Pulmonary Rehab 09/19/17 3:03 PM

## 2017-09-19 NOTE — Progress Notes (Signed)
Cardiac Individual Treatment Plan  Patient Details  Name: [name] MRN: [id number] Date of Birth: 1950/07/30 Referring Provider:   Flowsheet Row CARDIAC REHAB PHASE II ORIENTATION from 09/19/2017 in [facility] CARDIAC REHAB  Referring Provider  Bensimhon, Daniel MD      Initial Encounter Date:  Flowsheet Row CARDIAC REHAB PHASE II ORIENTATION from 09/19/2017 in [facility] CARDIAC REHAB  Date  09/19/17  Referring Provider  Arvilla Meres MD      Visit Diagnosis: ST elevation myocardial infarction (STEMI), unspecified artery (HCC)  Stented coronary artery  Patient's Home Medications on Admission:  Current Outpatient Medications:  .  acetaminophen (TYLENOL) 325 MG tablet, Take 1-2 tablets (325-650 mg total) by mouth every 4 (four) hours as needed for mild pain., Disp: , Rfl:  .  aspirin 81 MG chewable tablet, Place 1 tablet (81 mg total) into feeding tube daily. (Patient taking differently: Chew 81 mg by mouth daily. ), Disp: , Rfl:  .  atorvastatin (LIPITOR) 80 MG tablet, Take 1 tablet (80 mg total) by mouth daily at 6 PM., Disp: 90 tablet, Rfl: 3 .  carvedilol (COREG) 3.125 MG tablet, Take 1 tablet (3.125 mg total) by mouth 2 (two) times daily., Disp: 180 tablet, Rfl: 3 .  docusate sodium (COLACE) 100 MG capsule, Take 100 mg by mouth 2 (two) times daily as needed for mild constipation. , Disp: , Rfl:  .  sacubitril-valsartan (ENTRESTO) 24-26 MG, Take 1 tablet by mouth 2 (two) times daily., Disp: 60 tablet, Rfl: 3 .  senna (SENOKOT) 8.6 MG TABS tablet, Take 1 tablet (8.6 mg total) by mouth at bedtime. (Patient not taking: Reported on 09/14/2017), Disp: 120 each, Rfl: 0 .  ticagrelor (BRILINTA) 90 MG TABS tablet, Take 1 tablet (90 mg total) by mouth 2 (two) times daily., Disp: 60 tablet, Rfl: 2 .  triamcinolone cream (KENALOG) 0.5 %, Apply topically 4 (four) times daily. Apply thin smear to rash (Patient not taking: Reported on 09/14/2017), Disp: 60  g, Rfl: 2  Past Medical History: Past Medical History:  Diagnosis Date  . ACS (acute coronary syndrome) (HCC)   . Acute anterior wall MI (HCC)   . Acute blood loss anemia   . Acute heart failure (HCC)   . Acute hypoxemic respiratory failure (HCC)   . Acute pulmonary edema (HCC)   . Acute respiratory failure with hypoxia (HCC)   . Aneurysm of left ventricle of heart 04/12/2017  . Anoxic brain injury (HCC)   . Bacteremia   . Benign essential HTN   . CAD in native artery   . Cardiogenic shock (HCC)   . CHF (congestive heart failure), NYHA class II, acute, combined (HCC) 04/11/2017  . Chronic neck pain   . Chronic systolic (congestive) heart failure (HCC)   . Confusion   . Congestive heart failure (CHF) (HCC)   . COPD (chronic obstructive pulmonary disease) (HCC) 04/11/2017  . Coronary artery disease   . Dysphagia   . Encounter for central line placement   . Encounter for management of intra-aortic balloon pump   . Essential hypertension   . Fever   . Hyperglycemia   . Hypertension   . Hypokalemia   . Hypotension due to drugs   . Hypoxemia   . Labile blood pressure   . Leukocytosis   . Physical debility 04/27/2017  . Rash 05/15/2017  . Sepsis (HCC)   . Sore throat   . STEMI (ST elevation myocardial infarction) (HCC) 04/11/2017  .  Supplemental oxygen dependent   . Tachypnea   . 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 (HCC)     Tobacco Use: Social History   Tobacco Use  Smoking Status Former Smoker  . Types: Cigarettes  . Last attempt to quit: 04/11/2017  . Years since quitting: 0.4  Smokeless Tobacco Never Used    Labs: Recent Review Flowsheet Data    Labs for ITP Cardiac and Pulmonary Rehab Latest Ref Rng & Units 04/29/2017 04/30/2017 05/01/2017 05/02/2017 07/28/2017   Cholestrol 100 - 199 mg/dL - - - - 60(A)   LDLCALC 0 - 99 mg/dL - - - - 55   HDL >54 mg/dL - - - - 09(W)   Trlycerides 0 - 149 mg/dL - - - - 56   Hemoglobin A1c 4.8 -  5.6 % - - - - 5.5   PHART 7.350 - 7.450 - - - - -   PCO2ART 32.0 - 48.0 mmHg - - - - -   HCO3 20.0 - 28.0 mmol/L - - - - -   TCO2 22 - 32 mmol/L - - - - -   ACIDBASEDEF 0.0 - 2.0 mmol/L - - - - -   O2SAT % 61.0 60.4 59.0 85.7 -      Capillary Blood Glucose: Lab Results  Component Value Date   GLUCAP 111 (H) 05/06/2017   GLUCAP 87 05/06/2017   GLUCAP 99 05/06/2017   GLUCAP 90 05/06/2017   GLUCAP 89 05/05/2017     Exercise Target Goals: Date: 09/19/17  Exercise Program Goal: Individual exercise prescription set using results from initial 6 min walk test and THRR while considering  patient's activity barriers and safety.   Exercise Prescription Goal: Initial exercise prescription builds to 30-45 minutes a day of aerobic activity, 2-3 days per week.  Home exercise guidelines will be given to patient during program as part of exercise prescription that the participant will acknowledge.  Activity Barriers & Risk Stratification: Activity Barriers & Cardiac Risk Stratification - 09/19/17 1529    Activity Barriers & Cardiac Risk Stratification          Activity Barriers  Back Problems;Joint Problems;Deconditioning;Muscular Weakness;Shortness of Breath;Neck/Spine Problems;Other (comment);Assistive Device;Balance Concerns    Comments  stiffness in upper body: shoulders, upper back, lower neck    Cardiac Risk Stratification  High           6 Minute Walk: 6 Minute Walk    6 Minute Walk    Row Name 09/19/17 1520 09/19/17 1527   Phase  Initial  no documentation   Distance  400 feet  no documentation   Walk Time  5.3 minutes  no documentation   # of Rest Breaks  0  no documentation   MPH  no documentation  0.86   METS  no documentation  1   RPE  no documentation  13   Perceived Dyspnea   no documentation  1   VO2 Peak  no documentation  3.52   Symptoms  no documentation  Yes (comment)   Comments  no documentation  joint discomfort, SOB and back pain   Resting HR  no  documentation  65 bpm   Resting BP  no documentation  112/72   Resting Oxygen Saturation   no documentation  98 %   Exercise Oxygen Saturation  during 6 min walk  no documentation  96 %   Max Ex. HR  no documentation  77 bpm   Max Ex. BP  no documentation  120/66   2 Minute Post BP  no documentation  118/60          Oxygen Initial Assessment:   Oxygen Re-Evaluation:   Oxygen Discharge (Final Oxygen Re-Evaluation):   Initial Exercise Prescription: Initial Exercise Prescription - 09/19/17 1500    Date of Initial Exercise RX and Referring Provider          Date  09/19/17    Referring Provider  Bensimhon, Daniel MD        Recumbant Bike          Level  1    Minutes  15    METs  1        NuStep          Level  1    SPM  50    Minutes  15    METs  1        Prescription Details          Frequency (times per week)  3    Duration  Progress to 30 minutes of continuous aerobic without signs/symptoms of physical distress        Intensity          THRR 40-80% of Max Heartrate  60-121    Ratings of Perceived Exertion  11-13    Perceived Dyspnea  0-4        Progression          Progression  Continue to progress workloads to maintain intensity without signs/symptoms of physical distress.        Resistance Training          Training Prescription  Yes    Weight  1lbs    Reps  10-15           Perform Capillary Blood Glucose checks as needed.  Exercise Prescription Changes:   Exercise Comments:   Exercise Goals and Review: Exercise Goals    Exercise Goals    Row Name 09/19/17 1534   Increase Physical Activity  Yes   Intervention  Provide advice, education, support and counseling about physical activity/exercise needs.;Develop an individualized exercise prescription for aerobic and resistive training based on initial evaluation findings, risk stratification, comorbidities and participant's personal goals.   Expected Outcomes  Short Term: Attend rehab  on a regular basis to increase amount of physical activity.;Long Term: Add in home exercise to make exercise part of routine and to increase amount of physical activity.;Long Term: Exercising regularly at least 3-5 days a week.   Increase Strength and Stamina  Yes   Intervention  Provide advice, education, support and counseling about physical activity/exercise needs.;Develop an individualized exercise prescription for aerobic and resistive training based on initial evaluation findings, risk stratification, comorbidities and participant's personal goals.   Expected Outcomes  Short Term: Increase workloads from initial exercise prescription for resistance, speed, and METs.;Short Term: Perform resistance training exercises routinely during rehab and add in resistance training at home;Long Term: Improve cardiorespiratory fitness, muscular endurance and strength as measured by increased METs and functional capacity ( )   Able to understand and use rate of perceived exertion (RPE) scale  Yes   Intervention  Provide education and explanation on how to use RPE scale   Expected Outcomes  Short Term: Able to use RPE daily in rehab to express subjective intensity level;Long Term:  Able to use RPE to guide intensity level when exercising independently   Knowledge and understanding of Target Heart Rate Range (THRR)  Yes   Intervention  Provide education and explanation of THRR including how the numbers were predicted and where they are located for reference   Expected Outcomes  Short Term: Able to state/look up THRR;Long Term: Able to use THRR to govern intensity when exercising independently;Short Term: Able to use daily as guideline for intensity in rehab   Able to check pulse independently  Yes   Intervention  Provide education and demonstration on how to check pulse in carotid and radial arteries.;Review the importance of being able to check your own pulse for safety during independent exercise   Expected  Outcomes  Short Term: Able to explain why pulse checking is important during independent exercise;Long Term: Able to check pulse independently and accurately   Understanding of Exercise Prescription  Yes   Intervention  Provide education, explanation, and written materials on patient's individual exercise prescription   Expected Outcomes  Short Term: Able to explain program exercise prescription;Long Term: Able to explain home exercise prescription to exercise independently          Exercise Goals Re-Evaluation :    Discharge Exercise Prescription (Final Exercise Prescription Changes):   Nutrition:  Target Goals: Understanding of nutrition guidelines, daily intake of sodium 1500mg , cholesterol 200mg , calories 30% from fat and 7% or less from saturated fats, daily to have 5 or more servings of fruits and vegetables.  Biometrics: Pre Biometrics - 09/19/17 1535    Pre Biometrics          Height  5' 6.5" (1.689 m)    Weight  182 lb 5.1 oz (82.7 kg)    Waist Circumference  38.5 inches    Hip Circumference  41 inches    Waist to Hip Ratio  0.94 %    BMI (Calculated)  28.99    Triceps Skinfold  27 mm    % Body Fat  30 %    Grip Strength  41.5 kg    Flexibility  0 in    Single Leg Stand  12 seconds            Nutrition Therapy Plan and Nutrition Goals:   Nutrition Assessments:   Nutrition Goals Re-Evaluation:   Nutrition Goals Re-Evaluation:   Nutrition Goals Discharge (Final Nutrition Goals Re-Evaluation):   Psychosocial: Target Goals: Acknowledge presence or absence of significant depression and/or stress, maximize coping skills, provide positive support system. Participant is able to verbalize types and ability to use techniques and skills needed for reducing stress and depression.  Initial Review & Psychosocial Screening: Initial Psych Review & Screening - 09/19/17 1530    Initial Review          Current issues with  Current Stress Concerns    Source of  Stress Concerns  Chronic Illness        Family Dynamics          Good Support System?  Yes [name] has his signifigant other and sister for support        Barriers          Psychosocial barriers to participate in program  The patient should benefit from training in stress management and relaxation.        Screening Interventions          Interventions  Encouraged to exercise;To provide support and resources with identified psychosocial needs    Expected Outcomes  Long Term Goal: Stressors or current issues are controlled or eliminated.           Quality of Life Scores: Quality of Life -  09/19/17 1526    Quality of Life Scores          Health/Function Pre  27.6 %    Socioeconomic Pre  22.5 %    Psych/Spiritual Pre  30 %    Family Pre  26.4 %    GLOBAL Pre  26.74 %          Scores of 19 and below usually indicate a poorer quality of life in these areas.  A difference of  2-3 points is a clinically meaningful difference.  A difference of 2-3 points in the total score of the Quality of Life Index has been associated with significant improvement in overall quality of life, self-image, physical symptoms, and general health in studies assessing change in quality of life.  PHQ-9: Recent Review Flowsheet Data    Depression screen [facility] 2/9 07/28/2017 05/25/2017   Decreased Interest 0 0   Down, Depressed, Hopeless 0 0   PHQ - 2 Score 0 0   Altered sleeping - 0   Tired, decreased energy - 2   Change in appetite - 0   Feeling bad or failure about yourself  - 0   Trouble concentrating - 0   Moving slowly or fidgety/restless - 0   Suicidal thoughts - 0   PHQ-9 Score - 2     Interpretation of Total Score  Total Score Depression Severity:  1-4 = Minimal depression, 5-9 = Mild depression, 10-14 = Moderate depression, 15-19 = Moderately severe depression, 20-27 = Severe depression   Psychosocial Evaluation and Intervention:   Psychosocial Re-Evaluation:   Psychosocial Discharge  (Final Psychosocial Re-Evaluation):   Vocational Rehabilitation: Provide vocational rehab assistance to qualifying candidates.   Vocational Rehab Evaluation & Intervention: Vocational Rehab - 09/19/17 1529    Initial Vocational Rehab Evaluation & Intervention          Assessment shows need for Vocational Rehabilitation  No Mr Higinbotham does not need vocational rehab at this time           Education: Education Goals: Education classes will be provided on a weekly basis, covering required topics. Participant will state understanding/return demonstration of topics presented.  Learning Barriers/Preferences: Learning Barriers/Preferences - 09/18/17 1534    Learning Barriers/Preferences          Learning Barriers  Hearing    Learning Preferences  Written Material;Audio;Skilled Demonstration;Verbal Instruction;Video           Education Topics: Count Your Pulse:  -Group instruction provided by verbal instruction, demonstration, patient participation and written materials to support subject.  Instructors address importance of being able to find your pulse and how to count your pulse when at home without a heart monitor.  Patients get hands on experience counting their pulse with staff help and individually.   Heart Attack, Angina, and Risk Factor Modification:  -Group instruction provided by verbal instruction, video, and written materials to support subject.  Instructors address signs and symptoms of angina and heart attacks.    Also discuss risk factors for heart disease and how to make changes to improve heart health risk factors.   Functional Fitness:  -Group instruction provided by verbal instruction, demonstration, patient participation, and written materials to support subject.  Instructors address safety measures for doing things around the house.  Discuss how to get up and down off the floor, how to pick things up properly, how to safely get out of a chair without assistance,  and balance training.   Meditation and Mindfulness:  -Group instruction  provided by verbal instruction, patient participation, and written materials to support subject.  Instructor addresses importance of mindfulness and meditation practice to help reduce stress and improve awareness.  Instructor also leads participants through a meditation exercise.    Stretching for Flexibility and Mobility:  -Group instruction provided by verbal instruction, patient participation, and written materials to support subject.  Instructors lead participants through series of stretches that are designed to increase flexibility thus improving mobility.  These stretches are additional exercise for major muscle groups that are typically performed during regular warm up and cool down.   Hands Only CPR:  -Group verbal, video, and participation provides a basic overview of AHA guidelines for community CPR. Role-play of emergencies allow participants the opportunity to practice calling for help and chest compression technique with discussion of AED use.   Hypertension: -Group verbal and written instruction that provides a basic overview of hypertension including the most recent diagnostic guidelines, risk factor reduction with self-care instructions and medication management.    Nutrition I class: Heart Healthy Eating:  -Group instruction provided by PowerPoint slides, verbal discussion, and written materials to support subject matter. The instructor gives an explanation and review of the Therapeutic Lifestyle Changes diet recommendations, which includes a discussion on lipid goals, dietary fat, sodium, fiber, plant stanol/sterol esters, sugar, and the components of a well-balanced, healthy diet.   Nutrition II class: Lifestyle Skills:  -Group instruction provided by PowerPoint slides, verbal discussion, and written materials to support subject matter. The instructor gives an explanation and review of label reading,  grocery shopping for heart health, heart healthy recipe modifications, and ways to make healthier choices when eating out.   Diabetes Question & Answer:  -Group instruction provided by PowerPoint slides, verbal discussion, and written materials to support subject matter. The instructor gives an explanation and review of diabetes co-morbidities, pre- and post-prandial blood glucose goals, pre-exercise blood glucose goals, signs, symptoms, and treatment of hypoglycemia and hyperglycemia, and foot care basics.   Diabetes Blitz:  -Group instruction provided by PowerPoint slides, verbal discussion, and written materials to support subject matter. The instructor gives an explanation and review of the physiology behind type 1 and type 2 diabetes, diabetes medications and rational behind using different medications, pre- and post-prandial blood glucose recommendations and Hemoglobin A1c goals, diabetes diet, and exercise including blood glucose guidelines for exercising safely.    Portion Distortion:  -Group instruction provided by PowerPoint slides, verbal discussion, written materials, and food models to support subject matter. The instructor gives an explanation of serving size versus portion size, changes in portions sizes over the last 20 years, and what consists of a serving from each food group.   Stress Management:  -Group instruction provided by verbal instruction, video, and written materials to support subject matter.  Instructors review role of stress in heart disease and how to cope with stress positively.     Exercising on Your Own:  -Group instruction provided by verbal instruction, power point, and written materials to support subject.  Instructors discuss benefits of exercise, components of exercise, frequency and intensity of exercise, and end points for exercise.  Also discuss use of nitroglycerin and activating EMS.  Review options of places to exercise outside of rehab.  Review  guidelines for sex with heart disease.   Cardiac Drugs I:  -Group instruction provided by verbal instruction and written materials to support subject.  Instructor reviews cardiac drug classes: antiplatelets, anticoagulants, beta blockers, and statins.  Instructor discusses reasons, side effects, and lifestyle  considerations for each drug class.   Cardiac Drugs II:  -Group instruction provided by verbal instruction and written materials to support subject.  Instructor reviews cardiac drug classes: angiotensin converting enzyme inhibitors (ACE-I), angiotensin II receptor blockers (ARBs), nitrates, and calcium channel blockers.  Instructor discusses reasons, side effects, and lifestyle considerations for each drug class.   Anatomy and Physiology of the Circulatory System:  Group verbal and written instruction and models provide basic cardiac anatomy and physiology, with the coronary electrical and arterial systems. Review of: AMI, Angina, Valve disease, Heart Failure, Peripheral Artery Disease, Cardiac Arrhythmia, Pacemakers, and the ICD.   Other Education:  -Group or individual verbal, written, or video instructions that support the educational goals of the cardiac rehab program.   Holiday Eating Survival Tips:  -Group instruction provided by PowerPoint slides, verbal discussion, and written materials to support subject matter. The instructor gives patients tips, tricks, and techniques to help them not only survive but enjoy the holidays despite the onslaught of food that accompanies the holidays.   Knowledge Questionnaire Score: Knowledge Questionnaire Score - 09/19/17 1531    Knowledge Questionnaire Score          Pre Score  18/24           Core Components/Risk Factors/Patient Goals at Admission: Personal Goals and Risk Factors at Admission - 09/19/17 1540    Core Components/Risk Factors/Patient Goals on Admission           Weight Management  Yes;Weight Maintenance;Weight Loss     Intervention  Weight Management: Develop a combined nutrition and exercise program designed to reach desired caloric intake, while maintaining appropriate intake of nutrient and fiber, sodium and fats, and appropriate energy expenditure required for the weight goal.;Weight Management: Provide education and appropriate resources to help participant work on and attain dietary goals.;Weight Management/Obesity: Establish reasonable short term and long term weight goals.    Admit Weight  182 lb 5.1 oz (82.7 kg)    Goal Weight: Short Term  175 lb (79.4 kg)    Goal Weight: Long Term  170 lb (77.1 kg)    Expected Outcomes  Long Term: Adherence to nutrition and physical activity/exercise program aimed toward attainment of established weight goal;Short Term: Continue to assess and modify interventions until short term weight is achieved;Weight Maintenance: Understanding of the daily nutrition guidelines, which includes 25-35% calories from fat, 7% or less cal from saturated fats, less than 200mg  cholesterol, less than 1.5gm of sodium, & 5 or more servings of fruits and vegetables daily;Weight Loss: Understanding of general recommendations for a balanced deficit meal plan, which promotes 1-2 lb weight loss per week and includes a negative energy balance of [phone] kcal/d;Understanding recommendations for meals to include 15-35% energy as protein, 25-35% energy from fat, 35-60% energy from carbohydrates, less than 200mg  of dietary cholesterol, 20-35 gm of total fiber daily;Understanding of distribution of calorie intake throughout the day with the consumption of 4-5 meals/snacks           Core Components/Risk Factors/Patient Goals Review:    Core Components/Risk Factors/Patient Goals at Discharge (Final Review):    ITP Comments: ITP Comments    Row Name 09/19/17 1410   ITP Comments  Dr. Armanda Magic, Medical Director       Comments:  Patient attended orientation from [phone] to review rules and  guidelines for program. Completed 6 minute walk test, Intitial ITP, and exercise prescription.  VSS. Telemetry-sinus rhythm, TWI,  Asymptomatic. [name], RN, BSN Cardiac Pulmonary Rehab  09/19/17 4:03 PM

## 2017-09-20 DIAGNOSIS — S51859A Open bite of unspecified forearm, initial encounter: Secondary | ICD-10-CM

## 2017-09-20 HISTORY — DX: Open bite of unspecified forearm, initial encounter: S51.859A

## 2017-09-21 ENCOUNTER — Encounter: Payer: Self-pay | Admitting: Physical Medicine & Rehabilitation

## 2017-09-21 ENCOUNTER — Encounter: Payer: Medicare HMO | Attending: Physical Medicine & Rehabilitation | Admitting: Physical Medicine & Rehabilitation

## 2017-09-21 VITALS — BP 135/77 | HR 67 | Resp 14 | Ht 66.0 in | Wt 183.0 lb

## 2017-09-21 DIAGNOSIS — R5381 Other malaise: Secondary | ICD-10-CM | POA: Insufficient documentation

## 2017-09-21 DIAGNOSIS — I10 Essential (primary) hypertension: Secondary | ICD-10-CM

## 2017-09-21 DIAGNOSIS — I5021 Acute systolic (congestive) heart failure: Secondary | ICD-10-CM | POA: Insufficient documentation

## 2017-09-21 DIAGNOSIS — I238 Other current complications following acute myocardial infarction: Secondary | ICD-10-CM | POA: Diagnosis not present

## 2017-09-21 DIAGNOSIS — Z95811 Presence of heart assist device: Secondary | ICD-10-CM | POA: Insufficient documentation

## 2017-09-21 DIAGNOSIS — Z87891 Personal history of nicotine dependence: Secondary | ICD-10-CM | POA: Diagnosis not present

## 2017-09-21 DIAGNOSIS — I110 Hypertensive heart disease with heart failure: Secondary | ICD-10-CM | POA: Insufficient documentation

## 2017-09-21 DIAGNOSIS — R269 Unspecified abnormalities of gait and mobility: Secondary | ICD-10-CM | POA: Diagnosis not present

## 2017-09-21 DIAGNOSIS — R200 Anesthesia of skin: Secondary | ICD-10-CM | POA: Insufficient documentation

## 2017-09-21 DIAGNOSIS — J449 Chronic obstructive pulmonary disease, unspecified: Secondary | ICD-10-CM | POA: Insufficient documentation

## 2017-09-21 DIAGNOSIS — Z955 Presence of coronary angioplasty implant and graft: Secondary | ICD-10-CM | POA: Insufficient documentation

## 2017-09-21 DIAGNOSIS — I252 Old myocardial infarction: Secondary | ICD-10-CM | POA: Insufficient documentation

## 2017-09-21 DIAGNOSIS — R1310 Dysphagia, unspecified: Secondary | ICD-10-CM | POA: Diagnosis not present

## 2017-09-21 DIAGNOSIS — R21 Rash and other nonspecific skin eruption: Secondary | ICD-10-CM | POA: Diagnosis not present

## 2017-09-21 DIAGNOSIS — I2510 Atherosclerotic heart disease of native coronary artery without angina pectoris: Secondary | ICD-10-CM | POA: Insufficient documentation

## 2017-09-21 DIAGNOSIS — G8929 Other chronic pain: Secondary | ICD-10-CM | POA: Diagnosis not present

## 2017-09-21 DIAGNOSIS — I253 Aneurysm of heart: Secondary | ICD-10-CM

## 2017-09-21 NOTE — Progress Notes (Signed)
Subjective:    Patient ID: [name], male    DOB: [birth year], 67 y.o.   MRN: [id number]  HPI 67 y.o. male with history of CAD s/p STEMI, HTN, COPD, chronic neck pain presents for follow up for debility.  Last clinic visit 07/21/17.  Since that time, pt states he had UA/Ucx, not available on file, but patient states he had it done and it was normal. He continues to got to Cards.  He is in [employer]. His BP is improved. Denies falls.  He is using a cane/walker PRN.  Pain Inventory Average Pain 2 Pain Right Now 2 My pain is .  In the last 24 hours, has pain interfered with the following? General activity 2 Relation with others 2 Enjoyment of life 2 What TIME of day is your pain at its worst? . Sleep (in general) Fair  Pain is worse with: . Pain improves with: medication Relief from Meds: 2  Mobility walk with assistance use a cane use a walker ability to climb steps?  yes do you drive?  no use a wheelchair transfers alone  Function retired I need assistance with the following:  meal prep, household duties and shopping  Neuro/Psych numbness dizziness  Prior Studies Any changes since last visit?  no  Physicians involved in your care Any changes since last visit?  no   Family History  Problem Relation Age of Onset  . Esophageal cancer Mother    Social History   Socioeconomic History  . Marital status: Divorced    Spouse name: Not on file  . Number of children: Not on file  . Years of education: Not on file  . Highest education level: Not on file  Occupational History  . Not on file  Social Needs  . Financial resource strain: Not on file  . Food insecurity:    Worry: Not on file    Inability: Not on file  . Transportation needs:    Medical: Not on file    Non-medical: Not on file  Tobacco Use  . Smoking status: Former Smoker    Types: Cigarettes    Last attempt to quit: 04/11/2017    Years since quitting: 0.4  . Smokeless tobacco: Never Used    Substance and Sexual Activity  . Alcohol use: Not on file  . Drug use: Not on file  . Sexual activity: Not on file  Lifestyle  . Physical activity:    Days per week: 0 days    Minutes per session: 0 min  . Stress: To some extent  Relationships  . Social connections:    Talks on phone: Not on file    Gets together: Not on file    Attends religious service: Not on file    Active member of club or organization: Not on file    Attends meetings of clubs or organizations: Not on file    Relationship status: Not on file  Other Topics Concern  . Not on file  Social History Narrative  . Not on file   Past Surgical History:  Procedure Laterality Date  . APPLICATION OF WOUND VAC N/A 04/14/2017   Procedure: APPLICATION OF WOUND VAC with reposition of Impella device;  Surgeon: Delight Ovens, MD;  Location: [facility] OR;  Service: Thoracic;  Laterality: N/A;  . CARDIAC CATHETERIZATION    . CORONARY STENT INTERVENTION N/A 04/11/2017   Procedure: CORONARY STENT INTERVENTION;  Surgeon: Corky Crafts, MD;  Location: [facility] INVASIVE CV LAB;  Service:  Cardiovascular;  Laterality: N/A;  . CORONARY THROMBECTOMY N/A 04/11/2017   Procedure: Coronary Thrombectomy;  Surgeon: Corky Crafts, MD;  Location: [facility] INVASIVE CV LAB;  Service: Cardiovascular;  Laterality: N/A;  . CORONARY/GRAFT ACUTE MI REVASCULARIZATION N/A 04/11/2017   Procedure: Coronary/Graft Acute MI Revascularization;  Surgeon: Corky Crafts, MD;  Location: [facility] INVASIVE CV LAB;  Service: Cardiovascular;  Laterality: N/A;  . IABP INSERTION N/A 04/11/2017   Procedure: IABP Insertion;  Surgeon: Corky Crafts, MD;  Location: [facility] INVASIVE CV LAB;  Service: Cardiovascular;  Laterality: N/A;  . LEFT HEART CATH AND CORONARY ANGIOGRAPHY N/A 04/11/2017   Procedure: LEFT HEART CATH AND CORONARY ANGIOGRAPHY;  Surgeon: Corky Crafts, MD;  Location: [facility] INVASIVE CV LAB;  Service: Cardiovascular;  Laterality: N/A;  . PLACEMENT OF  IMPELLA LEFT VENTRICULAR ASSIST DEVICE  04/12/2017   Procedure: PLACEMENT OF IMPELLA  LD;  Surgeon: Delight Ovens, MD;  Location: [facility] OR;  Service: Open Heart Surgery;;  . REMOVAL OF IMPELLA LEFT VENTRICULAR ASSIST DEVICE N/A 04/17/2017   Procedure: REMOVAL OF IMPELLA LEFT VENTRICULAR ASSIST DEVICE;  Surgeon: Delight Ovens, MD;  Location: [facility] OR;  Service: Open Heart Surgery;  Laterality: N/A;  . REPAIR OF RIGHT VENTRICLE LACERATION Left 04/12/2017   Procedure: Resection of LV Aneurysm;  Surgeon: Delight Ovens, MD;  Location: [facility] OR;  Service: Open Heart Surgery;  Laterality: Left;  . STERNAL CLOSURE N/A 04/14/2017   Procedure: STERNAL CLOSURE;  Surgeon: Delight Ovens, MD;  Location: [facility] OR;  Service: Thoracic;  Laterality: N/A;  . TEE WITHOUT CARDIOVERSION N/A 04/14/2017   Procedure: TRANSESOPHAGEAL ECHOCARDIOGRAM (TEE);  Surgeon: Delight Ovens, MD;  Location: [facility] OR;  Service: Thoracic;  Laterality: N/A;  . ULTRASOUND GUIDANCE FOR VASCULAR ACCESS  04/11/2017   Procedure: Ultrasound Guidance For Vascular Access;  Surgeon: Corky Crafts, MD;  Location: [facility] INVASIVE CV LAB;  Service: Cardiovascular;;   Past Medical History:  Diagnosis Date  . ACS (acute coronary syndrome) (HCC)   . Acute anterior wall MI (HCC)   . Acute blood loss anemia   . Acute heart failure (HCC)   . Acute hypoxemic respiratory failure (HCC)   . Acute pulmonary edema (HCC)   . Acute respiratory failure with hypoxia (HCC)   . Aneurysm of left ventricle of heart 04/12/2017  . Anoxic brain injury (HCC)   . Bacteremia   . Benign essential HTN   . CAD in native artery   . Cardiogenic shock (HCC)   . CHF (congestive heart failure), NYHA class II, acute, combined (HCC) 04/11/2017  . Chronic neck pain   . Chronic systolic (congestive) heart failure (HCC)   . Confusion   . Congestive heart failure (CHF) (HCC)   . COPD (chronic obstructive pulmonary disease) (HCC) 04/11/2017  . Coronary artery  disease   . Dysphagia   . Encounter for central line placement   . Encounter for management of intra-aortic balloon pump   . Essential hypertension   . Fever   . Hyperglycemia   . Hypertension   . Hypokalemia   . Hypotension due to drugs   . Hypoxemia   . Labile blood pressure   . Leukocytosis   . Physical debility 04/27/2017  . Rash 05/15/2017  . Sepsis (HCC)   . Sore throat   . STEMI (ST elevation myocardial infarction) (HCC) 04/11/2017  . Supplemental oxygen dependent   . Tachypnea   . 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 (HCC)  BP 135/77 (BP Location: Left Arm, Patient Position: Sitting, Cuff Size: Normal)   Pulse 67   Resp 14   Ht  (1.676 m)   Wt 183 lb (83 kg)   SpO2 96%   BMI 29.54 kg/m   Opioid Risk Score:   Fall Risk Score:  `1  Depression screen PHQ 2/9  Depression screen [facility] 2/9 07/28/2017 05/25/2017  Decreased Interest 0 0  Down, Depressed, Hopeless 0 0  PHQ - 2 Score 0 0  Altered sleeping - 0  Tired, decreased energy - 2  Change in appetite - 0  Feeling bad or failure about yourself  - 0  Trouble concentrating - 0  Moving slowly or fidgety/restless - 0  Suicidal thoughts - 0  PHQ-9 Score - 2    Review of Systems  Constitutional: Positive for unexpected weight change.  HENT:       On thickened liquid after trach  Eyes: Positive for visual disturbance.       Occasional blurred vision  Respiratory: Negative.   Cardiovascular: Negative.   Gastrointestinal: Negative.   Endocrine: Negative.   Genitourinary: Negative.   Musculoskeletal: Negative.   Skin: Positive for rash.  Allergic/Immunologic: Negative.   Neurological: Positive for dizziness and numbness.  Hematological: Bruises/bleeds easily.       On Brilinta  All other systems reviewed and are negative.      Objective:   Physical Exam Constitutional: He appears well-developed and well-nourished. No distress.  HENT: Normocephalic and atraumatic.    Eyes: EOM are normal. No discharge.  Cardiovascular: +Murmur No JVD Respiratory: Lungs clear. Unlabored. GI: Bowel sounds are normal. He exhibits no distension.  Musculoskeletal: He exhibits no edema, no tenderness. Neurological: He is alert.  He can follow simple commands.   Motor: B/l UE 4+/5 proximal to distal (with exception of 3+/5 digits LUE) B/l LE: HF 4+/5, KE 4+/5, ADF/PF 4+/5  Skin is warm and dry. He is not diaphoretic.   Psychiatric: Normal mood.    Assessment & Plan:  67 y.o. male with history of CAD s/p STEMI, HTN, COPD, chronic neck pain presents for follow up for debility.  1. Debility secondary to coronary artery disease and left ventricular rupture with acute systolic heart failure  Cont Pulm therapies  Cont follow up with Cardiology  2. Acute systolic heart failure    Cont therapies  Cont follow up with Cards  Encouraged appropriate diet  3. Essential HTN   Cont follow up with Cards  Cont meds  4. Gait abnormality  Cont therapies  Cont cane

## 2017-09-25 ENCOUNTER — Encounter (HOSPITAL_COMMUNITY)
Admission: RE | Admit: 2017-09-25 | Discharge: 2017-09-25 | Disposition: A | Discharge status: Home / Self Care | Payer: Medicare HMO | Source: Ambulatory Visit | Attending: Internal Medicine | Admitting: Internal Medicine

## 2017-09-25 ENCOUNTER — Encounter (HOSPITAL_COMMUNITY): Payer: Self-pay

## 2017-09-25 ENCOUNTER — Encounter (HOSPITAL_COMMUNITY): Payer: Medicare HMO

## 2017-09-25 DIAGNOSIS — I213 ST elevation (STEMI) myocardial infarction of unspecified site: Secondary | ICD-10-CM | POA: Diagnosis not present

## 2017-09-25 DIAGNOSIS — Z955 Presence of coronary angioplasty implant and graft: Secondary | ICD-10-CM

## 2017-09-25 NOTE — Progress Notes (Signed)
[name] 66 y.o. male DOB February 18, 1951 MRN [id number]       Nutrition  1. ST elevation myocardial infarction (STEMI), unspecified artery (HCC)   2. Stented coronary artery    Past Medical History:  Diagnosis Date  . ACS (acute coronary syndrome) (HCC)   . Acute anterior wall MI (HCC)   . Acute blood loss anemia   . Acute heart failure (HCC)   . Acute hypoxemic respiratory failure (HCC)   . Acute pulmonary edema (HCC)   . Acute respiratory failure with hypoxia (HCC)   . Aneurysm of left ventricle of heart 04/12/2017  . Anoxic brain injury (HCC)   . Bacteremia   . Benign essential HTN   . CAD in native artery   . Cardiogenic shock (HCC)   . CHF (congestive heart failure), NYHA class II, acute, combined (HCC) 04/11/2017  . Chronic neck pain   . Chronic systolic (congestive) heart failure (HCC)   . Confusion   . Congestive heart failure (CHF) (HCC)   . COPD (chronic obstructive pulmonary disease) (HCC) 04/11/2017  . Coronary artery disease   . Dysphagia   . Encounter for central line placement   . Encounter for management of intra-aortic balloon pump   . Essential hypertension   . Fever   . Hyperglycemia   . Hypertension   . Hypokalemia   . Hypotension due to drugs   . Hypoxemia   . Labile blood pressure   . Leukocytosis   . Physical debility 04/27/2017  . Rash 05/15/2017  . Sepsis (HCC)   . Sore throat   . STEMI (ST elevation myocardial infarction) (HCC) 04/11/2017  . Supplemental oxygen dependent   . Tachypnea   . 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   HT: Ht Readings from Last 1 Encounters:  09/21/17  (1.676 m)    WT: Wt Readings from Last 5 Encounters:  09/21/17 183 lb (83 kg)  09/19/17 182 lb 5.1 oz (82.7 kg)  08/22/17 188 lb 8 oz (85.5 kg)  07/28/17 190 lb (86.2 kg)  07/04/17 192 lb 8 oz (87.3 kg)     BMI 29.55  Current tobacco use? No     Recently quit 04/11/2017  Labs:  Lipid Panel      Component Value Date/Time   CHOL 99 (L) 07/28/2017 1025   TRIG 56 07/28/2017 1025   HDL 33 (L) 07/28/2017 1025   CHOLHDL 3.0 07/28/2017 1025   CHOLHDL 6.2 04/11/2017 2138   VLDL 22 04/11/2017 2138   LDLCALC 55 07/28/2017 1025    Lab Results  Component Value Date   HGBA1C 5.5 07/28/2017   CBG (last 3)  No results for input(s): GLUCAP in the last 72 hours.  Nutrition Diagnosis ? Food-and nutrition-related knowledge deficit related to lack of exposure to information as related to diagnosis of: ? CVD  ? Overweight related to excessive energy intake as evidenced by a 29.55  Nutrition Goal(s):  ? To be determined with pt  Plan:  Pt to attend nutrition classes ? Nutrition I ? Nutrition II ? Portion Distortion  Will provide client-centered nutrition education as part of interdisciplinary care.   Monitor and evaluate progress toward nutrition goal with team.  Mickle Plumb, M.Ed, RD, LDN, CDE 09/25/2017 2:23 PM

## 2017-09-25 NOTE — Progress Notes (Signed)
Daily Session Note  Patient Details  Name: [name] MRN: [id number] Date of Birth: Apr 23, 1951 Referring Provider:   Flowsheet Row CARDIAC REHAB PHASE II ORIENTATION from 09/19/2017 in [place]  Referring Provider  Glori Bickers MD      Encounter Date: 09/25/2017  Check In: Session Check In - 09/25/17 1401    Check-In          Location  [facility]    Staff Present  Barnet Pall, RN, BSN;Joann Rion, RN, [facility], MS, ACSM RCEP, Exercise Physiologist    Supervising physician immediately available to respond to emergencies  [facility] immediately available    Physician(s)  Dr Tyrell Antonio    Medication changes reported      No    Fall or balance concerns reported     No    Tobacco Cessation  No Change    Warm-up and Cool-down  Performed as group-led instruction    Resistance Training Performed  Yes    VAD Patient?  No        Pain Assessment          Currently in Pain?  No/denies           Capillary Blood Glucose: No results found for this or any previous visit (from the past 24 hour(s)).    Social History   Tobacco Use  Smoking Status Former Smoker  . Types: Cigarettes  . Last attempt to quit: 04/11/2017  . Years since quitting: 0.4  Smokeless Tobacco Never Used    Goals Met:  Exercise tolerated well  Goals Unmet:  Not Applicable  Pt started cardiac rehab today.  Pt tolerated light exercise without difficulty. VSS, telemetry-sinus rhythm,  asymptomatic.  Medication list reconciled. Pt denies barriers to medicaiton compliance.  PSYCHOSOCIAL ASSESSMENT:  PHQ-0.   Pt exhibits positive coping skills, hopeful outlook with supportive family. No psychosocial needs identified at this time, no psychosocial interventions necessary.    Pt oriented to exercise equipment and routine.    Understanding verbalized.  Dr. Fransico Him is Medical Director for Cardiac Rehab at [facility].

## 2017-09-27 ENCOUNTER — Emergency Department (HOSPITAL_COMMUNITY): Payer: Medicare HMO

## 2017-09-27 ENCOUNTER — Other Ambulatory Visit: Payer: Self-pay

## 2017-09-27 ENCOUNTER — Encounter (HOSPITAL_COMMUNITY): Payer: Self-pay

## 2017-09-27 ENCOUNTER — Encounter (HOSPITAL_COMMUNITY): Payer: Medicare HMO

## 2017-09-27 ENCOUNTER — Encounter (HOSPITAL_COMMUNITY)
Admission: RE | Admit: 2017-09-27 | Discharge: 2017-09-27 | Disposition: A | Discharge status: Home / Self Care | Payer: Medicare HMO | Source: Ambulatory Visit | Attending: Internal Medicine | Admitting: Internal Medicine

## 2017-09-27 ENCOUNTER — Emergency Department (HOSPITAL_COMMUNITY)
Admission: EM | Admit: 2017-09-27 | Discharge: 2017-09-27 | Disposition: A | Discharge status: Home / Self Care | Payer: Medicare HMO | Attending: Emergency Medicine | Admitting: Emergency Medicine

## 2017-09-27 DIAGNOSIS — Z955 Presence of coronary angioplasty implant and graft: Secondary | ICD-10-CM | POA: Diagnosis not present

## 2017-09-27 DIAGNOSIS — R079 Chest pain, unspecified: Secondary | ICD-10-CM | POA: Diagnosis not present

## 2017-09-27 DIAGNOSIS — Z87891 Personal history of nicotine dependence: Secondary | ICD-10-CM | POA: Diagnosis not present

## 2017-09-27 DIAGNOSIS — I5022 Chronic systolic (congestive) heart failure: Secondary | ICD-10-CM | POA: Insufficient documentation

## 2017-09-27 DIAGNOSIS — R55 Syncope and collapse: Secondary | ICD-10-CM | POA: Diagnosis not present

## 2017-09-27 DIAGNOSIS — I213 ST elevation (STEMI) myocardial infarction of unspecified site: Secondary | ICD-10-CM | POA: Diagnosis not present

## 2017-09-27 DIAGNOSIS — R0602 Shortness of breath: Secondary | ICD-10-CM | POA: Diagnosis not present

## 2017-09-27 DIAGNOSIS — R42 Dizziness and giddiness: Secondary | ICD-10-CM | POA: Diagnosis not present

## 2017-09-27 DIAGNOSIS — J449 Chronic obstructive pulmonary disease, unspecified: Secondary | ICD-10-CM | POA: Diagnosis not present

## 2017-09-27 DIAGNOSIS — I2510 Atherosclerotic heart disease of native coronary artery without angina pectoris: Secondary | ICD-10-CM | POA: Insufficient documentation

## 2017-09-27 DIAGNOSIS — I110 Hypertensive heart disease with heart failure: Secondary | ICD-10-CM | POA: Insufficient documentation

## 2017-09-27 DIAGNOSIS — Z79899 Other long term (current) drug therapy: Secondary | ICD-10-CM | POA: Diagnosis not present

## 2017-09-27 DIAGNOSIS — Z7982 Long term (current) use of aspirin: Secondary | ICD-10-CM | POA: Insufficient documentation

## 2017-09-27 LAB — BASIC METABOLIC PANEL
ANION GAP: 9 (ref 5–15)
BUN: 14 mg/dL (ref 6–20)
CALCIUM: 9.1 mg/dL (ref 8.9–10.3)
CO2: 25 mmol/L (ref 22–32)
CREATININE: 1.8 mg/dL — AB (ref 0.61–1.24)
Chloride: 107 mmol/L (ref 101–111)
GFR, EST AFRICAN AMERICAN: 43 mL/min — AB (ref >60)
GFR, EST NON AFRICAN AMERICAN: 37 mL/min — AB (ref >60)
Glucose, Bld: 121 mg/dL — ABNORMAL HIGH (ref 65–99)
Potassium: 4.5 mmol/L (ref 3.5–5.1)
Sodium: 141 mmol/L (ref 135–145)

## 2017-09-27 LAB — I-STAT TROPONIN, ED
TROPONIN I, POC: 0.09 ng/mL — AB (ref 0.00–0.08)
Troponin i, poc: 0 ng/mL (ref 0.00–0.08)

## 2017-09-27 LAB — CBC
HCT: 37.5 % — ABNORMAL LOW (ref 39.0–52.0)
HEMOGLOBIN: 11.9 g/dL — AB (ref 13.0–17.0)
MCH: 26.3 pg (ref 26.0–34.0)
MCHC: 31.7 g/dL (ref 30.0–36.0)
MCV: 82.8 fL (ref 78.0–100.0)
PLATELETS: 264 10*3/uL (ref 150–400)
RBC: 4.53 MIL/uL (ref 4.22–5.81)
RDW: 16 % — ABNORMAL HIGH (ref 11.5–15.5)
WBC: 8.3 10*3/uL (ref 4.0–10.5)

## 2017-09-27 LAB — APTT: APTT: 32 s (ref 24–36)

## 2017-09-27 LAB — PROTIME-INR
INR: 1.24
PROTHROMBIN TIME: 15.5 s — AB (ref 11.4–15.2)

## 2017-09-27 LAB — GLUCOSE, CAPILLARY: Glucose-Capillary: 126 mg/dL — ABNORMAL HIGH (ref 65–99)

## 2017-09-27 NOTE — ED Triage Notes (Signed)
Pt brought from cardiac rehab for SOB and blurred vision off and on all day to both eyes, clear at present. CBG 126. EKG shows sinus rhythm with T wave inversion which is at his baseline. Pt was hypotensive. Had a STEMI in November.

## 2017-09-27 NOTE — ED Notes (Signed)
Patient transported to CT 

## 2017-09-27 NOTE — ED Provider Notes (Signed)
[facility] EMERGENCY DEPARTMENT Provider Note  CSN: [id number] Arrival date & time: 09/27/17 1335  Chief Complaint(s) Shortness of Breath and Hypotension  HPI [name] is a 67 y.o. male   The history is provided by the patient.  Dizziness  Quality:  Lightheadedness Severity:  Moderate Onset quality:  Sudden Duration: several months. Timing:  Intermittent Progression:  Waxing and waning (Patient notices mostly after he eats) Chronicity:  Recurrent Context comment:  Sporadic Relieved by: self resolves. Exacerbated by: usually occurs after eating. Associated symptoms: shortness of breath ("breathing gets interupted") and vision changes (blurry vision; resolved)   Associated symptoms: no chest pain, no nausea, no syncope and no vomiting   Risk factors: heart disease and multiple medications   Risk factors: no hx of vertigo    Patient reports that he was in cardiac rehab when he began to have any symptoms but states he just prior to going to rehab.  Past Medical History Past Medical History:  Diagnosis Date  . ACS (acute coronary syndrome) (HCC)   . Acute anterior wall MI (HCC)   . Acute blood loss anemia   . Acute heart failure (HCC)   . Acute hypoxemic respiratory failure (HCC)   . Acute pulmonary edema (HCC)   . Acute respiratory failure with hypoxia (HCC)   . Aneurysm of left ventricle of heart 04/12/2017  . Anoxic brain injury (HCC)   . Bacteremia   . Benign essential HTN   . CAD in native artery   . Cardiogenic shock (HCC)   . CHF (congestive heart failure), NYHA class II, acute, combined (HCC) 04/11/2017  . Chronic neck pain   . Chronic systolic (congestive) heart failure (HCC)   . Confusion   . Congestive heart failure (CHF) (HCC)   . COPD (chronic obstructive pulmonary disease) (HCC) 04/11/2017  . Coronary artery disease   . Dysphagia   . Encounter for central line placement   . Encounter for management of intra-aortic balloon pump   .  Essential hypertension   . Fever   . Hyperglycemia   . Hypertension   . Hypokalemia   . Hypotension due to drugs   . Hypoxemia   . Labile blood pressure   . Leukocytosis   . Physical debility 04/27/2017  . Rash 05/15/2017  . Sepsis (HCC)   . Sore throat   . STEMI (ST elevation myocardial infarction) (HCC) 04/11/2017  . Supplemental oxygen dependent   . Tachypnea   . 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 Rash 05/15/2017  . Sore throat   . Essential hypertension   . Hypotension due to drugs   . Labile blood pressure   . Supplemental oxygen dependent   . Confusion   . Hyperglycemia   . Hypokalemia   . Anoxic brain injury (HCC)   . Chronic systolic (congestive) heart failure (HCC)   . CAD in native artery   . Physical debility 04/27/2017  . 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 Acute respiratory failure with hypoxia (HCC)   . Hypoxemia   . Congestive heart failure (CHF) (HCC)   . Dysphagia   . Bacteremia   . Benign essential HTN   . Chronic neck pain   . Sepsis (HCC)   . Acute blood loss anemia   . Tachypnea   . Leukocytosis   . Fever   . Cardiogenic shock (  HCC)   . Aneurysm of left ventricle of heart 04/12/2017  . Acute anterior wall MI (HCC)   . Acute pulmonary edema (HCC)   . Acute hypoxemic respiratory failure (HCC)   . ACS (acute coronary syndrome) (HCC)   . Acute heart failure (HCC)   . Encounter for central line placement   . Encounter for management of intra-aortic balloon pump   . STEMI (ST elevation myocardial infarction) (HCC) 04/11/2017  . COPD (chronic obstructive pulmonary disease) (HCC) 04/11/2017  . CHF (congestive heart failure), NYHA class II, acute, combined (HCC) 04/11/2017   Home Medication(s) Prior to Admission medications   Medication Sig Start Date End Date Taking? Authorizing Provider  acetaminophen  (TYLENOL) 325 MG tablet Take 1-2 tablets (325-650 mg total) by mouth every 4 (four) hours as needed for mild pain. 05/03/17  Yes Love, Evlyn Kanner, PA-C  aspirin 81 MG chewable tablet Place 1 tablet (81 mg total) into feeding tube daily. Patient taking differently: Chew 81 mg by mouth daily.  06/02/17  Yes Clegg, Amy D, NP  atorvastatin (LIPITOR) 80 MG tablet Take 1 tablet (80 mg total) by mouth daily at 6 PM. 07/13/17  Yes Bensimhon, Bevelyn Buckles, MD  carvedilol (COREG) 3.125 MG tablet Take 1 tablet (3.125 mg total) by mouth 2 (two) times daily. 08/22/17 11/20/17 Yes Bensimhon, Bevelyn Buckles, MD  docusate sodium (COLACE) 100 MG capsule Take 100 mg by mouth 2 (two) times daily as needed for mild constipation.    Yes [provider]  sacubitril-valsartan (ENTRESTO) 24-26 MG Take 1 tablet by mouth 2 (two) times daily. 07/04/17  Yes Bensimhon, Bevelyn Buckles, MD  ticagrelor (BRILINTA) 90 MG TABS tablet Take 1 tablet (90 mg total) by mouth 2 (two) times daily. 09/11/17  Yes Bensimhon, Bevelyn Buckles, MD  triamcinolone cream (KENALOG) 0.5 % Apply topically 4 (four) times daily. Apply thin smear to rash Patient taking differently: Apply 1 application topically daily as needed (for rash).  05/12/17  Yes Love, Evlyn Kanner, PA-C  senna (SENOKOT) 8.6 MG TABS tablet Take 1 tablet (8.6 mg total) by mouth at bedtime. Patient not taking: Reported on 09/27/2017 05/12/17   Love, Margarette Asal                                                                                                                                    Past Surgical History Past Surgical History:  Procedure Laterality Date  . APPLICATION OF WOUND VAC N/A 04/14/2017   Procedure: APPLICATION OF WOUND VAC with reposition of Impella device;  Surgeon: Delight Ovens, MD;  Location: [facility] OR;  Service: Thoracic;  Laterality: N/A;  . CARDIAC CATHETERIZATION    . CORONARY STENT INTERVENTION N/A 04/11/2017   Procedure: CORONARY STENT INTERVENTION;  Surgeon: Corky Crafts,  MD;  Location: [facility] INVASIVE CV LAB;  Service: Cardiovascular;  Laterality: N/A;  . CORONARY THROMBECTOMY N/A 04/11/2017   Procedure: Coronary Thrombectomy;  Surgeon: Corky Crafts, MD;  Location: [facility] INVASIVE CV LAB;  Service: Cardiovascular;  Laterality: N/A;  . CORONARY/GRAFT ACUTE MI REVASCULARIZATION N/A 04/11/2017   Procedure: Coronary/Graft Acute MI Revascularization;  Surgeon: Corky Crafts, MD;  Location: [facility] INVASIVE CV LAB;  Service: Cardiovascular;  Laterality: N/A;  . IABP INSERTION N/A 04/11/2017   Procedure: IABP Insertion;  Surgeon: Corky Crafts, MD;  Location: [facility] INVASIVE CV LAB;  Service: Cardiovascular;  Laterality: N/A;  . LEFT HEART CATH AND CORONARY ANGIOGRAPHY N/A 04/11/2017   Procedure: LEFT HEART CATH AND CORONARY ANGIOGRAPHY;  Surgeon: Corky Crafts, MD;  Location: [facility] INVASIVE CV LAB;  Service: Cardiovascular;  Laterality: N/A;  . PLACEMENT OF IMPELLA LEFT VENTRICULAR ASSIST DEVICE  04/12/2017   Procedure: PLACEMENT OF IMPELLA  LD;  Surgeon: Delight Ovens, MD;  Location: [facility] OR;  Service: Open Heart Surgery;;  . REMOVAL OF IMPELLA LEFT VENTRICULAR ASSIST DEVICE N/A 04/17/2017   Procedure: REMOVAL OF IMPELLA LEFT VENTRICULAR ASSIST DEVICE;  Surgeon: Delight Ovens, MD;  Location: [facility] OR;  Service: Open Heart Surgery;  Laterality: N/A;  . REPAIR OF RIGHT VENTRICLE LACERATION Left 04/12/2017   Procedure: Resection of LV Aneurysm;  Surgeon: Delight Ovens, MD;  Location: [facility] OR;  Service: Open Heart Surgery;  Laterality: Left;  . STERNAL CLOSURE N/A 04/14/2017   Procedure: STERNAL CLOSURE;  Surgeon: Delight Ovens, MD;  Location: [facility] OR;  Service: Thoracic;  Laterality: N/A;  . TEE WITHOUT CARDIOVERSION N/A 04/14/2017   Procedure: TRANSESOPHAGEAL ECHOCARDIOGRAM (TEE);  Surgeon: Delight Ovens, MD;  Location: [facility] OR;  Service: Thoracic;  Laterality: N/A;  . ULTRASOUND GUIDANCE FOR VASCULAR ACCESS  04/11/2017   Procedure: Ultrasound  Guidance For Vascular Access;  Surgeon: Corky Crafts, MD;  Location: [facility] INVASIVE CV LAB;  Service: Cardiovascular;;   Family History Family History  Problem Relation Age of Onset  . Esophageal cancer Mother     Social History Social History   Tobacco Use  . Smoking status: Former Smoker    Types: Cigarettes    Last attempt to quit: 04/11/2017    Years since quitting: 0.4  . Smokeless tobacco: Never Used  Substance Use Topics  . Alcohol use: Never    Frequency: Never  . Drug use: Never   Allergies Other and Tomato  Review of Systems Review of Systems  Respiratory: Positive for shortness of breath ("breathing gets interupted").   Cardiovascular: Negative for chest pain and syncope.  Gastrointestinal: Negative for nausea and vomiting.  Neurological: Positive for dizziness.   All other systems are reviewed and are negative for acute change except as noted in the HPI  Physical Exam Vital Signs  I have reviewed the triage vital signs BP 126/66   Pulse 67   Temp 98.6 F (37 C) (Oral)   Resp 16   Ht 5\' 6"  (1.676 m)   Wt 83 kg (183 lb)   SpO2 95%   BMI 29.54 kg/m   Physical Exam  Constitutional: He is oriented to person, place, and time. He appears well-developed and well-nourished. No distress.  HENT:  Head: Normocephalic and atraumatic.  Nose: Nose normal.  Eyes: Pupils are equal, round, and reactive to light. Conjunctivae and EOM are normal. Right eye exhibits no discharge. Left eye exhibits no discharge. No scleral icterus.  Neck: Normal range of motion. Neck supple.  Cardiovascular: Normal rate and regular rhythm. Exam reveals no gallop and no friction rub.  No murmur heard. Pulmonary/Chest: Effort normal and  breath sounds normal. No stridor. No respiratory distress. He has no rales.  Abdominal: Soft. He exhibits no distension. There is no tenderness.  Musculoskeletal: He exhibits no edema or tenderness.  Neurological: He is alert and oriented to  person, place, and time.  Skin: Skin is warm and dry. No rash noted. He is not diaphoretic. No erythema.  Psychiatric: He has a normal mood and affect.  Vitals reviewed.   ED Results and Treatments Labs (all labs ordered are listed, but only abnormal results are displayed) Labs Reviewed  PROTIME-INR - Abnormal; Notable for the following components:      Result Value   Prothrombin Time 15.5 (*)    All other components within normal limits  BASIC METABOLIC PANEL - Abnormal; Notable for the following components:   Glucose, Bld 121 (*)    Creatinine, Ser 1.80 (*)    GFR calc non Af Amer 37 (*)    GFR calc Af Amer 43 (*)    All other components within normal limits  CBC - Abnormal; Notable for the following components:   Hemoglobin 11.9 (*)    HCT 37.5 (*)    RDW 16.0 (*)    All other components within normal limits  I-STAT TROPONIN, ED - Abnormal; Notable for the following components:   Troponin i, poc 0.09 (*)    All other components within normal limits  APTT  I-STAT TROPONIN, ED                                                                                                                         EKG  EKG Interpretation  Date/Time:  Wednesday Sep 27 2017 13:44:36 EDT Ventricular Rate:  73 PR Interval:  142 QRS Duration: 86 QT Interval:  404 QTC Calculation: 445 R Axis:   113 Text Interpretation:  Normal sinus rhythm Possible Left atrial enlargement Cannot rule out Inferior infarct , age undetermined Anterolateral infarct , age undetermined Abnormal ECG NO STEMI Confirmed by Drema Pry [phone]) on 09/27/2017 2:58:05 PM      Radiology Dg Chest 2 View  Result Date: 09/27/2017 CLINICAL DATA:  Shortness of breath, blurry vision and dizziness for 1 day. History of COPD, pulmonary embolism, CHF. EXAM: CHEST - 2 VIEW COMPARISON:  Chest radiograph June 22, 2017 FINDINGS: The cardiac silhouette is mildly enlarged and unchanged. Calcified aortic arch. Status post median  sternotomy. Mild chronic interstitial changes with increased lung volumes with flattened hemidiaphragms. No pleural effusion or focal consolidation. No pneumothorax. Mild degenerative change of the thoracic spine. RIGHT proximal humerus bone infarct versus enchondroma. IMPRESSION: Mild cardiomegaly and COPD. Electronically Signed   By: Awilda Metro M.D.   On: 09/27/2017 14:39   Ct Head Wo Contrast  Result Date: 09/27/2017 CLINICAL DATA:  Intermittent dizziness and blurry vision. History of anoxic brain injury, hypertension, hyperglycemia. EXAM: CT HEAD WITHOUT CONTRAST TECHNIQUE: Contiguous axial images were obtained from the base of the skull through the vertex without intravenous contrast. COMPARISON:  None.  FINDINGS: BRAIN: No intraparenchymal hemorrhage, mass effect nor midline shift. Mild parenchymal brain volume loss. No hydrocephalus. Patchy supratentorial white matter hypodensities less than expected for patient's age, though non-specific are most compatible with chronic small vessel ischemic disease. No acute large vascular territory infarcts. No abnormal extra-axial fluid collections. Basal cisterns are patent. VASCULAR: Moderate calcific atherosclerosis of the carotid siphons. SKULL: No skull fracture. No significant scalp soft tissue swelling. SINUSES/ORBITS: Trace LEFT mastoid effusion. Subcentimeter RIGHT maxillary mucosal retention cyst.The included ocular globes and orbital contents are non-suspicious. OTHER: None. IMPRESSION: Negative noncontrast CT HEAD for age. Electronically Signed   By: Awilda Metro M.D.   On: 09/27/2017 15:42   Pertinent labs & imaging results that were available during my care of the patient were reviewed by me and considered in my medical decision making (see chart for details).  Medications Ordered in ED Medications - No data to display                                                                                                                                   Procedures Procedures  (including critical care time)  Medical Decision Making / ED Course I have reviewed the nursing notes for this encounter and the patient's prior records (if available in EHR or on provided paperwork).    EKG with inferior T wave inversions consistent with recent RCA infarct.  Patient denied any associated chest pain during this episode.  Triage labs were obtained and revealed an elevated troponin at 0.09.  Chest x-ray without evidence suggestive of pneumonia, pneumothorax, pneumomediastinum.  No abnormal contour of the mediastinum to suggest dissection. No evidence of acute injuries. On review of systems and noted that the patient's troponins had never cleared.  Given the lack of chest pain is uncertain whether this episode is cardiac in nature versus orthostasis from vasovagal.  Other screening labs are grossly reassuring which did reveal mild AKI suspicious for dehydration.  I spoke with cardiology who were in agreement with obtaining a delta troponin.  If it cleared all remained stable they felt the patient would be safe for discharge with close follow-up.  Delta troponin was negative.  Patient remained hemodynamically stable throughout his stay.  Hydrated orally.    Final Clinical Impression(s) / ED Diagnoses Final diagnoses:  Near syncope    Disposition: Discharge  Condition: Good  I have discussed the results, Dx and Tx plan with the patient who expressed understanding and agree(s) with the plan. Discharge instructions discussed at great length. The patient was given strict return precautions who verbalized understanding of the instructions. No further questions at time of discharge.    ED Discharge Orders    None       Follow Up: Bing Neighbors, FNP [address] [place] [address] [phone]  Schedule an appointment as soon as possible for a visit  As needed  Cardiology  Schedule an appointment as  soon as possible for a visit         This chart was dictated using voice recognition software.  Despite best efforts to proofread,  errors can occur which can change the documentation meaning.   Nira Conn, MD 09/27/17 [phone]

## 2017-09-27 NOTE — ED Notes (Signed)
Pt's sister is going to step out. Phone number is in the chart if needed.
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
## 2017-09-27 NOTE — Progress Notes (Signed)
Incomplete Session Note  Patient Details  Name: [name] MRN: [id number] Date of Birth: 15-Jun-1950 Referring Provider:     CARDIAC REHAB PHASE II ORIENTATION from 09/19/2017 in [facility]ARD[facility]AC [facility]  Referring Provider  Bensimhon, Reuel Boom MD      [name] did not complete his rehab session.  Pt arrived to the cardiac rehab department by wheelchair escort and volunteer.  Pt complains of blurry vision and shortness of breath.  Pt brought into department for assessment.  Pt BP 88/50, o2 sat 97%, CBG 126.  HR 72 SR with inverted t wave ( not new for pt).  Pt noted that the blurred vision has been off and on throughout the day. Called and spoke to [place] at the heart failure clinic.  Advised to transport pt to the ED for further evaluation.  Advised pt of the plan. Pt is in agreement of these and contacted his sister who brought him to exercise.  Pt transported to ER via wheelchair on portable montior,report given to triage RN. Alanson Aly, BSN Cardiac and Emergency planning/management officer

## 2017-09-28 ENCOUNTER — Telehealth (HOSPITAL_COMMUNITY): Payer: Self-pay | Admitting: *Deleted

## 2017-09-28 NOTE — Telephone Encounter (Signed)
Pt evaluated in the ER for blurred vision,shortness of breath and low BP.  Pt has follow up appt on 5/9 with Herma Carson PA.  Pt advised to hold on returning to exercise at cardiac rehab until this follow up is completed and pt is cleared to return to rehab.  Pt verbalized understanding and reports feeling better. Alanson Aly, BSN Cardiac and Emergency planning/management officer

## 2017-09-29 ENCOUNTER — Encounter (HOSPITAL_COMMUNITY): Payer: Medicare HMO

## 2017-10-02 ENCOUNTER — Encounter (HOSPITAL_COMMUNITY): Payer: Medicare HMO

## 2017-10-03 ENCOUNTER — Ambulatory Visit: Payer: Medicare Other | Admitting: Physician Assistant

## 2017-10-04 ENCOUNTER — Encounter (HOSPITAL_COMMUNITY): Payer: Medicare HMO

## 2017-10-05 ENCOUNTER — Ambulatory Visit (INDEPENDENT_AMBULATORY_CARE_PROVIDER_SITE_OTHER): Payer: Medicare HMO | Admitting: Family Medicine

## 2017-10-05 ENCOUNTER — Ambulatory Visit (INDEPENDENT_AMBULATORY_CARE_PROVIDER_SITE_OTHER): Payer: Medicare HMO | Admitting: Physician Assistant

## 2017-10-05 ENCOUNTER — Telehealth: Payer: Self-pay | Admitting: Family Medicine

## 2017-10-05 ENCOUNTER — Encounter: Payer: Self-pay | Admitting: Family Medicine

## 2017-10-05 ENCOUNTER — Encounter: Payer: Self-pay | Admitting: Physician Assistant

## 2017-10-05 VITALS — BP 110/70 | HR 74 | Ht 67.0 in | Wt 176.4 lb

## 2017-10-05 VITALS — BP 90/64 | HR 63 | Ht 66.0 in | Wt 176.4 lb

## 2017-10-05 DIAGNOSIS — I10 Essential (primary) hypertension: Secondary | ICD-10-CM | POA: Diagnosis not present

## 2017-10-05 DIAGNOSIS — I952 Hypotension due to drugs: Secondary | ICD-10-CM

## 2017-10-05 DIAGNOSIS — R7989 Other specified abnormal findings of blood chemistry: Secondary | ICD-10-CM | POA: Diagnosis not present

## 2017-10-05 DIAGNOSIS — Z122 Encounter for screening for malignant neoplasm of respiratory organs: Secondary | ICD-10-CM

## 2017-10-05 DIAGNOSIS — D649 Anemia, unspecified: Secondary | ICD-10-CM | POA: Diagnosis not present

## 2017-10-05 DIAGNOSIS — Z1211 Encounter for screening for malignant neoplasm of colon: Secondary | ICD-10-CM

## 2017-10-05 DIAGNOSIS — I253 Aneurysm of heart: Secondary | ICD-10-CM | POA: Diagnosis not present

## 2017-10-05 DIAGNOSIS — M542 Cervicalgia: Secondary | ICD-10-CM | POA: Diagnosis not present

## 2017-10-05 DIAGNOSIS — G8929 Other chronic pain: Secondary | ICD-10-CM

## 2017-10-05 DIAGNOSIS — I238 Other current complications following acute myocardial infarction: Secondary | ICD-10-CM

## 2017-10-05 DIAGNOSIS — I5041 Acute combined systolic (congestive) and diastolic (congestive) heart failure: Secondary | ICD-10-CM | POA: Diagnosis not present

## 2017-10-05 DIAGNOSIS — Z1159 Encounter for screening for other viral diseases: Secondary | ICD-10-CM

## 2017-10-05 DIAGNOSIS — I2510 Atherosclerotic heart disease of native coronary artery without angina pectoris: Secondary | ICD-10-CM
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
## 2017-10-05 NOTE — Patient Instructions (Signed)
It was nice to meet you today Continue current medications We'll call you with results of lab work I would strongly recommend a colonoscopy and have placed a referral to gastroenterology for you.

## 2017-10-05 NOTE — Patient Instructions (Signed)
Medication Instructions:  Your physician recommends that you continue on your current medications as directed. Please refer to the Current Medication list given to you today.  If you need a refill on your cardiac medications, please contact your pharmacy first.  Labwork: None ordered   Testing/Procedures: None ordered   Follow-Up: Your physician wants you to follow-up in 2 months with Dr. Eldridge Dace.   Any Other Special Instructions Will Be Listed Below (If Applicable).   Thank you for choosing [facility]    Lyda Perone, RN  [phone]  If you need a refill on your cardiac medications before your next appointment, please call your pharmacy.

## 2017-10-05 NOTE — Progress Notes (Signed)
Cardiology Office Note    Date:  10/05/2017   ID:  [name], DOB 1950-10-01, MRN [id number]  PCP:  No primary care provider on file.  Cardiologist: Lance Muss, MD  Advanced CHF Dr. Gala Romney  Chief Complaint  Patient presents with  . New Patient (Initial Visit)    History of Present Illness:  [name] is a 67 y.o. 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  Attempted PCI of the LAD failed due to chronic occlusion and extensive infarct.    He also has chronic systolic CHF secondary to cardiogenic shock LVEF improved from 25% to 40% to now 55% on last echo 08/22/2017.  Patient has been treated with Entresto and carvedilol.  He has been followed closely by Dr. Gala Romney in the heart failure clinic and is here to be established with general cardiology.  Also has hypertension, COPD, PAF converted to normal sinus rhythm.  Amiodarone recently stopped by Dr. Gala Romney.  Patient was sent to the emergency room from cardiac rehab 09/27/2017 with dizziness and presyncope.  Troponin was mildly elevated at 0.09 but delta troponin was negative.  CT of head no acute change, creatinine 1.8, hemoglobin 11.9.  EKG normal sinus rhythm with anterolateral T wave inversion unchanged.  Symptoms most likely vasovagal.    Past Medical History:  Diagnosis Date  . ACS (acute coronary syndrome) (HCC)   . Acute anterior wall MI (HCC)   . Acute blood loss anemia   . Acute heart failure (HCC)   . Acute hypoxemic respiratory failure (HCC)   . Acute pulmonary edema (HCC)   . Acute respiratory failure with hypoxia (HCC)   . Aneurysm of left ventricle of heart 04/12/2017  . Anoxic brain injury (HCC)   . Bacteremia   . Benign essential HTN   . CAD in native artery   . Cardiogenic shock (HCC)   . CHF (congestive heart failure), NYHA class II, acute, combined (HCC) 04/11/2017  . Chronic neck pain   . Chronic  systolic (congestive) heart failure (HCC)   . Confusion   . Congestive heart failure (CHF) (HCC)   . COPD (chronic obstructive pulmonary disease) (HCC) 04/11/2017  . Coronary artery disease   . Dysphagia   . Encounter for central line placement   . Encounter for management of intra-aortic balloon pump   . Essential hypertension   . Fever   . Hyperglycemia   . Hypertension   . Hypokalemia   . Hypotension due to drugs   . Hypoxemia   . Labile blood pressure   . Leukocytosis   . Physical debility 04/27/2017  . Rash 05/15/2017  . Sepsis (HCC)   . Sore throat   . STEMI (ST elevation myocardial infarction) (HCC) 04/11/2017  . Supplemental oxygen dependent   . Tachypnea   . 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 APPLICATION OF WOUND VAC N/A 04/14/2017   Procedure: APPLICATION OF WOUND VAC with reposition of Impella device;  Surgeon: Delight Ovens, MD;  Location: [facility] OR;  Service: Thoracic;  Laterality: N/A;  . CARDIAC CATHETERIZATION    . CORONARY STENT INTERVENTION N/A 04/11/2017   Procedure: CORONARY STENT INTERVENTION;  Surgeon: Corky Crafts, MD;  Location: [facility] INVASIVE CV LAB;  Service: Cardiovascular;  Laterality: N/A;  . CORONARY THROMBECTOMY N/A 04/11/2017   Procedure: Coronary Thrombectomy;  Surgeon: Corky Crafts, MD;  Location: [facility] INVASIVE CV LAB;  Service: Cardiovascular;  Laterality: N/A;  . CORONARY/GRAFT ACUTE MI REVASCULARIZATION N/A 04/11/2017   Procedure: Coronary/Graft Acute MI Revascularization;  Surgeon: Corky Crafts, MD;  Location: [facility] INVASIVE CV LAB;  Service: Cardiovascular;  Laterality: N/A;  . IABP INSERTION N/A 04/11/2017   Procedure: IABP Insertion;  Surgeon: Corky Crafts, MD;  Location: [facility] INVASIVE CV LAB;  Service: Cardiovascular;  Laterality: N/A;  . LEFT HEART CATH AND CORONARY ANGIOGRAPHY N/A 04/11/2017   Procedure: LEFT HEART  CATH AND CORONARY ANGIOGRAPHY;  Surgeon: Corky Crafts, MD;  Location: [facility] INVASIVE CV LAB;  Service: Cardiovascular;  Laterality: N/A;  . PLACEMENT OF IMPELLA LEFT VENTRICULAR ASSIST DEVICE  04/12/2017   Procedure: PLACEMENT OF IMPELLA  LD;  Surgeon: Delight Ovens, MD;  Location: [facility] OR;  Service: Open Heart Surgery;;  . REMOVAL OF IMPELLA LEFT VENTRICULAR ASSIST DEVICE N/A 04/17/2017   Procedure: REMOVAL OF IMPELLA LEFT VENTRICULAR ASSIST DEVICE;  Surgeon: Delight Ovens, MD;  Location: [facility] OR;  Service: Open Heart Surgery;  Laterality: N/A;  . REPAIR OF RIGHT VENTRICLE LACERATION Left 04/12/2017   Procedure: Resection of LV Aneurysm;  Surgeon: Delight Ovens, MD;  Location: [facility] OR;  Service: Open Heart Surgery;  Laterality: Left;  . STERNAL CLOSURE N/A 04/14/2017   Procedure: STERNAL CLOSURE;  Surgeon: Delight Ovens, MD;  Location: [facility] OR;  Service: Thoracic;  Laterality: N/A;  . TEE WITHOUT CARDIOVERSION N/A 04/14/2017   Procedure: TRANSESOPHAGEAL ECHOCARDIOGRAM (TEE);  Surgeon: Delight Ovens, MD;  Location: [facility] OR;  Service: Thoracic;  Laterality: N/A;  . ULTRASOUND GUIDANCE FOR VASCULAR ACCESS  04/11/2017   Procedure: Ultrasound Guidance For Vascular Access;  Surgeon: Corky Crafts, MD;  Location: [facility] INVASIVE CV LAB;  Service: Cardiovascular;;    Current Medications: Current Meds  Medication Sig  . acetaminophen (TYLENOL) 325 MG tablet Take 1-2 tablets (325-650 mg total) by mouth every 4 (four) hours as needed for mild pain.  Marland Kitchen aspirin 81 MG chewable tablet Place 1 tablet (81 mg total) into feeding tube daily. (Patient taking differently: Chew 81 mg by mouth daily. )  . atorvastatin (LIPITOR) 80 MG tablet Take 1 tablet (80 mg total) by mouth daily at 6 PM.  . carvedilol (COREG) 3.125 MG tablet Take 1 tablet (3.125 mg total) by mouth 2 (two) times daily.  Marland Kitchen docusate sodium (COLACE) 100 MG capsule Take 100 mg by mouth 2 (two) times daily as needed for mild  constipation.   . sacubitril-valsartan (ENTRESTO) 24-26 MG Take 1 tablet by mouth 2 (two) times daily.  Marland Kitchen senna (SENOKOT) 8.6 MG TABS tablet Take 1 tablet (8.6 mg total) by mouth at bedtime.  . ticagrelor (BRILINTA) 90 MG TABS tablet Take 1 tablet (90 mg total) by mouth 2 (two) times daily.  Marland Kitchen triamcinolone cream (KENALOG) 0.5 % Apply topically 4 (four) times daily. Apply thin smear to rash (Patient taking differently: Apply 1 application topically daily as needed (for rash). )     Allergies:   Other and Tomato   Social History   Socioeconomic History  . Marital status: Divorced    Spouse name: Not on file  . Number of children: Not on file  . Years of education: Not on file  . Highest education level: Not on file  Occupational History  . Not on file  Social Needs  . Financial resource strain: Not on file  . Food insecurity:  Worry: Not on file    Inability: Not on file  . Transportation needs:    Medical: Not on file    Non-medical: Not on file  Tobacco Use  . Smoking status: Former Smoker    Types: Cigarettes    Last attempt to quit: 04/11/2017    Years since quitting: 0.4  . Smokeless tobacco: Never Used  Substance and Sexual Activity  . Alcohol use: Never    Frequency: Never  . Drug use: Never  . Sexual activity: Not on file  Lifestyle  . Physical activity:    Days per week: 0 days    Minutes per session: 0 min  . Stress: To some extent  Relationships  . Social connections:    Talks on phone: Not on file    Gets together: Not on file    Attends religious service: Not on file    Active member of club or organization: Not on file    Attends meetings of clubs or organizations: Not on file    Relationship status: Not on file  Other Topics Concern  . Not on file  Social History Narrative  . Not on file     Family History:  The patient's family history includes Esophageal cancer in his mother.   ROS:   Please see the history of present illness.    Review  of Systems  Constitution: Negative.  HENT: Negative.   Eyes: Positive for visual disturbance.  Cardiovascular: Negative.   Respiratory: Negative.   Endocrine: Negative.   Hematologic/Lymphatic: Negative.   Musculoskeletal: Positive for arthritis, neck pain and stiffness.  Gastrointestinal: Negative.   Genitourinary: Negative.   Neurological: Positive for dizziness and light-headedness.   All other systems reviewed and are negative.   PHYSICAL EXAM:   VS:  BP 90/64   Pulse 63   Ht  (1.676 m)   Wt 176 lb 6.4 oz (80 kg)   SpO2 98%   BMI 28.47 kg/m   Physical Exam  GEN: Well nourished, well developed, in no acute distress  Neck: no JVD, carotid bruits, or masses Cardiac:RRR; no murmurs, rubs, or gallops  Respiratory:  clear to auscultation bilaterally, normal work of breathing GI: soft, nontender, nondistended, + BS Ext: without cyanosis, clubbing, or edema, Good distal pulses bilaterally Neuro:  Alert and Oriented x 3 Psych: euthymic mood, full affect  Wt Readings from Last 3 Encounters:  10/05/17 176 lb 6.4 oz (80 kg)  09/27/17 183 lb (83 kg)  09/21/17 183 lb (83 kg)      Studies/Labs Reviewed:   EKG:  EKG is ordered today.  The ekg ordered today demonstrates normal sinus rhythm with T wave inversion inferior anterior lateral, no acute change  Recent Labs: 05/04/2017: Magnesium 2.3 07/28/2017: ALT 16; TSH 2.310 09/27/2017: BUN 14; Creatinine, Ser 1.80; Hemoglobin 11.9; Platelets 264; Potassium 4.5; Sodium 141   Lipid Panel    Component Value Date/Time   CHOL 99 (L) 07/28/2017 1025   TRIG 56 07/28/2017 1025   HDL 33 (L) 07/28/2017 1025   CHOLHDL 3.0 07/28/2017 1025   CHOLHDL 6.2 04/11/2017 2138   VLDL 22 04/11/2017 2138   LDLCALC 55 07/28/2017 1025    Additional studies/ records that were reviewed today include:  Echo 3/26/19Study Conclusions   - Left ventricle: The cavity size was normal. Wall thickness was   normal. Systolic function was mildly reduced.  Doppler parameters   are consistent with abnormal left ventricular relaxation (grade 1   diastolic dysfunction). Suspect  severe hypokinesis or akinesis of   the apex and apical segments of the anterior and anteroseptal   walls. Overall EF may be approximately 40%. - Ventricular septum: Septal motion showed paradox. These changes   are consistent with a post-thoracotomy state. - Mitral valve: Calcified annulus.   Impressions:   - Normal contractility of the basal LV wall segments. Even with   Definity contrast, imaging of the apical segments is mediocre.   Suspect at least severe hypokinesis, possibly akinesis of the   mid-LAD territory wall segments and overall mildly depressed   systolic function.  Cardiac cath 04/11/17 Conclusion      Mid LAD-2 lesion is 100% stenosed.  Balloon angioplasty was performed using a BALLOON SAPPHIRE 2.5X12. Post intervention, there is a 25% residual stenosis with significant no reflow in the apical LAD.  Dist RCA lesion is 95% stenosed. A drug-eluting stent was successfully placed using a STENT SYNERGY DES 3.5X28.  Post intervention, there is a 0% residual stenosis.  LV end diastolic pressure is severely elevated.  There is no aortic valve stenosis.  Ost 1st Diag lesion is 50% stenosed.  Proximal ramus is 70% stenosed.  IABP placed due to high LVEDP.   Continue IV Angiomax for the current bag.  He will need dual antiplatelet therapy for ideally 1 year.  He will need echocardiogram to evaluate left ventricular function.  I suspect his LV function is severely decreased based on his LVEDP of 44 mmHg.   Continue aggressive secondary prevention, including smoking cessation.     Appreciate assistance from [facility] team for vent management.     IV Lasix ordered.        ASSESSMENT:    1. CAD in native artery   2. CHF (congestive heart failure), NYHA class II, acute, combined (HCC)   3. Hypotension due to drugs   4. Ventricular aneurysm as  complication of acute myocardial infarction [facility])      PLAN:  In order of problems listed above:  CAD status post out-of-hospital inferior/inferolateral STEMI 04/11/2017 intubated in the ED, taken to the Cath Lab by Dr. Eldridge Dace and underwent DES to the RCA and p.o. BA to chronically occluded LAD and intra-aortic balloon pump placed.  Patient had LV pseudoaneurysm repair 04/12/2017 and was followed throughout the hospitalization by Dr. Gala Romney in the CHF team since then.  LVEF improved from 25% to 50% on last echo 08/22/2017.  Continue Brilinta, aspirin, Lipitor recent labs stable  CHF systolic and diastolic chronic without acute symptoms.  No heart failure on exam.  Doing well.  Recent labs checked in the emergency room 09/28/2017 so no need to repeat.  Hypotension due to medications-Dizziness and presyncope in the ER 09/28/2017 most likely vasovagal.  Recommend he continue low-dose Entresto and Coreg at this time, try to stay hydrated.  Follow-up with Dr. Eldridge Dace in 2 months.     Medication Adjustments/Labs and Tests Ordered: Current medicines are reviewed at length with the patient today.  Concerns regarding medicines are outlined above.  Medication changes, Labs and Tests ordered today are listed in the Patient Instructions below. Patient Instructions  Medication Instructions:  Your physician recommends that you continue on your current medications as directed. Please refer to the Current Medication list given to you today.  If you need a refill on your cardiac medications, please contact your pharmacy first.  Labwork: None ordered   Testing/Procedures: None ordered   Follow-Up: Your physician wants you to follow-up in 2 months with Dr. Eldridge Dace.  Any Other Special Instructions Will Be Listed Below (If Applicable).   Thank you for choosing [facility]    Lyda Perone, RN  [phone]  If you need a refill on your cardiac medications before your next appointment,  please call your pharmacy.      Signed, Jacolyn Reedy, PA-C  10/05/2017 1:46 PM    [facility] [address] [place], [place], [place]  [address] Phone: [phone]; Fax: [phone]

## 2017-10-06 ENCOUNTER — Encounter (HOSPITAL_COMMUNITY): Payer: Medicare HMO

## 2017-10-06 ENCOUNTER — Telehealth: Payer: Self-pay | Admitting: Emergency Medicine

## 2017-10-06 ENCOUNTER — Other Ambulatory Visit: Payer: Self-pay | Admitting: Family Medicine

## 2017-10-06 ENCOUNTER — Encounter: Payer: Self-pay | Admitting: Family Medicine

## 2017-10-06 DIAGNOSIS — D649 Anemia, unspecified: Secondary | ICD-10-CM | POA: Insufficient documentation

## 2017-10-06 DIAGNOSIS — R7989 Other specified abnormal findings of blood chemistry: Secondary | ICD-10-CM | POA: Insufficient documentation

## 2017-10-06 DIAGNOSIS — D509 Iron deficiency anemia, unspecified: Secondary | ICD-10-CM

## 2017-10-06 DIAGNOSIS — Z1159 Encounter for screening for other viral diseases: Secondary | ICD-10-CM | POA: Insufficient documentation

## 2017-10-06 DIAGNOSIS — N179 Acute kidney failure, unspecified: Secondary | ICD-10-CM

## 2017-10-06 HISTORY — DX: Iron deficiency anemia, unspecified: D50.9

## 2017-10-06 HISTORY — DX: Other specified abnormal findings of blood chemistry: R79.89

## 2017-10-06 LAB — BASIC METABOLIC PANEL
BUN: 27 mg/dL — ABNORMAL HIGH (ref 6–23)
CO2: 24 mEq/L (ref 19–32)
Calcium: 9.2 mg/dL (ref 8.4–10.5)
Chloride: 104 mEq/L (ref 96–112)
Creatinine, Ser: 1.93 mg/dL — ABNORMAL HIGH (ref 0.40–1.50)
GFR: 36.86 mL/min — ABNORMAL LOW (ref >60.00)
Glucose, Bld: 88 mg/dL (ref 70–99)
Potassium: 4.6 mEq/L (ref 3.5–5.1)
Sodium: 139 mEq/L (ref 135–145)

## 2017-10-06 LAB — CBC
HCT: 37.7 % — ABNORMAL LOW (ref 39.0–52.0)
Hemoglobin: 12.3 g/dL — ABNORMAL LOW (ref 13.0–17.0)
MCHC: 32.7 g/dL (ref 30.0–36.0)
MCV: 79.8 fl (ref 78.0–100.0)
Platelets: 208 10*3/uL (ref 150.0–400.0)
RBC: 4.72 Mil/uL (ref 4.22–5.81)
RDW: 17.1 % — ABNORMAL HIGH (ref 11.5–15.5)
WBC: 8.4 10*3/uL (ref 4.0–10.5)

## 2017-10-06 LAB — FERRITIN: Ferritin: 73.5 ng/mL (ref 22.0–322.0)

## 2017-10-06 LAB — HEPATITIS C ANTIBODY
Hepatitis C Ab: NONREACTIVE
SIGNAL TO CUT-OFF: 0.04 (ref <1.00)

## 2017-10-06 NOTE — Addendum Note (Signed)
Addended by: Mammie Lorenzo on: 10/06/2017 09:09 AM   Modules accepted: Orders

## 2017-10-06 NOTE — Assessment & Plan Note (Signed)
Stable at this time History of CHF as well, EF has improved with medication/cardiac rehab He'll continue current medications and will follow with cardiology.

## 2017-10-06 NOTE — Telephone Encounter (Signed)
Patient is calling inquiring about lab results.  Pt would also like to know if OK to take Geritol OTC supplement tablet?  Pt wants to know if this might help with his energy level.  Please advise.

## 2017-10-06 NOTE — Assessment & Plan Note (Signed)
Referral to GI for colon cancer screening/chronic anemia

## 2017-10-06 NOTE — Telephone Encounter (Signed)
A user error has taken place: encounter opened in error, closed for administrative reasons.

## 2017-10-06 NOTE — Assessment & Plan Note (Signed)
Recheck CBC and ferritin 

## 2017-10-06 NOTE — Telephone Encounter (Signed)
Spoke with patient regarding holding off on taking the supplements for now. Patient understood and stated that he did take one today but he will hold off until seen by Nephrology. Nothing further needed.

## 2017-10-06 NOTE — Assessment & Plan Note (Signed)
Has upcoming appt with orthopedics May continue tylenol as needed for now.

## 2017-10-06 NOTE — Assessment & Plan Note (Signed)
Hep C screening ordered

## 2017-10-06 NOTE — Assessment & Plan Note (Signed)
Blood pressure is well controlled, continue current medications.

## 2017-10-06 NOTE — Telephone Encounter (Signed)
Would hold off on any supplements for now until seen by nephrology.

## 2017-10-06 NOTE — Assessment & Plan Note (Signed)
Recheck serum creatinine. 

## 2017-10-09 ENCOUNTER — Encounter (HOSPITAL_COMMUNITY): Payer: Medicare HMO

## 2017-10-10 ENCOUNTER — Other Ambulatory Visit: Payer: Self-pay | Admitting: Acute Care

## 2017-10-10 DIAGNOSIS — J9601 Acute respiratory failure with hypoxia: Secondary | ICD-10-CM | POA: Diagnosis not present

## 2017-10-10 DIAGNOSIS — Z87891 Personal history of nicotine dependence: Secondary | ICD-10-CM

## 2017-10-10 DIAGNOSIS — R5381 Other malaise: Secondary | ICD-10-CM | POA: Diagnosis not present

## 2017-10-10 DIAGNOSIS — G931 Anoxic brain damage, not elsewhere classified: Secondary | ICD-10-CM | POA: Diagnosis not present

## 2017-10-10 DIAGNOSIS — Z122 Encounter for screening for malignant neoplasm of respiratory organs: Secondary | ICD-10-CM

## 2017-10-11 ENCOUNTER — Encounter (HOSPITAL_COMMUNITY): Payer: Medicare HMO

## 2017-10-12 ENCOUNTER — Ambulatory Visit (HOSPITAL_COMMUNITY): Payer: Self-pay | Admitting: Cardiac Rehabilitation

## 2017-10-12 ENCOUNTER — Telehealth (HOSPITAL_COMMUNITY): Payer: Self-pay | Admitting: *Deleted

## 2017-10-12 ENCOUNTER — Telehealth (HOSPITAL_COMMUNITY): Payer: Self-pay | Admitting: Cardiac Rehabilitation

## 2017-10-12 DIAGNOSIS — I213 ST elevation (STEMI) myocardial infarction of unspecified site: Secondary | ICD-10-CM

## 2017-10-12 DIAGNOSIS — Z955 Presence of coronary angioplasty implant and graft: Secondary | ICD-10-CM

## 2017-10-12 NOTE — Telephone Encounter (Signed)
pc received from pt he is not ready to participate in CR group exercise at this time. Pt has been r/s for July 2019.  [name], RN, BSN Cardiac Pulmonary Rehab

## 2017-10-12 NOTE — Telephone Encounter (Signed)
Pt called cardiac rehab program. Pt would like to discharge and return after he completes additional follow up, consults and procedures.  Pt completed 2 exercise sessions.  Pt scheduled for 7/30 orietnation after seen by Dr. Eldridge Dace.  Pt is in agreement of this plan of care.

## 2017-10-12 NOTE — Progress Notes (Signed)
Discharge Progress Report  Patient Details  Name: [name] MRN: [id number] Date of Birth: [birth year] Referring Provider:   Flowsheet Row CARDIAC REHAB PHASE II ORIENTATION from 09/19/2017 in [facility] CARDIAC [facility]  Referring Provider  Arvilla Meres MD       Number of Visits: 2   Reason for Discharge: pt exited early due to frequent doctor appointments at this time.    Smoking History:  Social History   Tobacco Use  Smoking Status Former Smoker  . Types: Cigarettes  . Last attempt to quit: 04/11/2017  . Years since quitting: 0.5  Smokeless Tobacco Never Used    Diagnosis:  ST elevation myocardial infarction (STEMI), unspecified artery (HCC)  Stented coronary artery  ADL UCSD:   Initial Exercise Prescription: Initial Exercise Prescription - 09/19/17 1500    Date of Initial Exercise RX and Referring Provider          Date  09/19/17    Referring Provider  Bensimhon, Daniel MD        Recumbant Bike          Level  1    Minutes  15    METs  1        NuStep          Level  1    SPM  50    Minutes  15    METs  1        Prescription Details          Frequency (times per week)  3    Duration  Progress to 30 minutes of continuous aerobic without signs/symptoms of physical distress        Intensity          THRR 40-80% of Max Heartrate  60-121    Ratings of Perceived Exertion  11-13    Perceived Dyspnea  0-4        Progression          Progression  Continue to progress workloads to maintain intensity without signs/symptoms of physical distress.        Resistance Training          Training Prescription  Yes    Weight  1lbs    Reps  10-15           Discharge Exercise Prescription (Final Exercise Prescription Changes): Exercise Prescription Changes - 09/25/17 1424    Response to Exercise          Blood Pressure (Admit)  138/68    Blood Pressure (Exercise)  138/64    Blood Pressure (Exit)  120/80    Heart Rate  (Admit)  72 bpm    Heart Rate (Exercise)  85 bpm    Heart Rate (Exit)  77 bpm    Rating of Perceived Exertion (Exercise)  12    Symptoms  none    Comments  pt was oriented to exercise equipment today    Duration  Continue with 30 min of aerobic exercise without signs/symptoms of physical distress.    Intensity  THRR unchanged        Progression          Progression  Continue to progress workloads to maintain intensity without signs/symptoms of physical distress.    Average METs  1.8        Resistance Training          Training Prescription  Yes    Weight  1lbs    Reps  10-15  Time  10 Minutes        Recumbant Bike          Level  1    Minutes  15    METs  2.1        NuStep          Level  1    SPM  60    Minutes  15    METs  1.5           Functional Capacity: 6 Minute Walk    6 Minute Walk    Row Name 09/19/17 1520 09/19/17 1527   Phase  Initial  no documentation   Distance  400 feet  no documentation   Walk Time  5.3 minutes  no documentation   # of Rest Breaks  0  no documentation   MPH  no documentation  0.86   METS  no documentation  1   RPE  no documentation  13   Perceived Dyspnea   no documentation  1   VO2 Peak  no documentation  3.52   Symptoms  no documentation  Yes (comment)   Comments  no documentation  joint discomfort, SOB and back pain   Resting HR  no documentation  65 bpm   Resting BP  no documentation  112/72   Resting Oxygen Saturation   no documentation  98 %   Exercise Oxygen Saturation  during 6 min walk  no documentation  96 %   Max Ex. HR  no documentation  77 bpm   Max Ex. BP  no documentation  120/66   2 Minute Post BP  no documentation  118/60          Psychological, QOL, Others - Outcomes: PHQ 2/9: Depression screen [facility] 2/9 09/25/2017 07/28/2017 05/25/2017  Decreased Interest 0 0 0  Down, Depressed, Hopeless 0 0 0  PHQ - 2 Score 0 0 0  Altered sleeping - - 0  Tired, decreased energy - - 2  Change in appetite - - 0   Feeling bad or failure about yourself  - - 0  Trouble concentrating - - 0  Moving slowly or fidgety/restless - - 0  Suicidal thoughts - - 0  PHQ-9 Score - - 2    Quality of Life: Quality of Life - 09/19/17 1526    Quality of Life Scores          Health/Function Pre  27.6 %    Socioeconomic Pre  22.5 %    Psych/Spiritual Pre  30 %    Family Pre  26.4 %    GLOBAL Pre  26.74 %           Personal Goals: Goals established at orientation with interventions provided to work toward goal. Personal Goals and Risk Factors at Admission - 09/19/17 1540    Core Components/Risk Factors/Patient Goals on Admission           Weight Management  Yes;Weight Maintenance;Weight Loss    Intervention  Weight Management: Develop a combined nutrition and exercise program designed to reach desired caloric intake, while maintaining appropriate intake of nutrient and fiber, sodium and fats, and appropriate energy expenditure required for the weight goal.;Weight Management: Provide education and appropriate resources to help participant work on and attain dietary goals.;Weight Management/Obesity: Establish reasonable short term and long term weight goals.    Admit Weight  182 lb 5.1 oz (82.7 kg)    Goal Weight: Short Term  175 lb (79.4  kg)    Goal Weight: Long Term  170 lb (77.1 kg)    Expected Outcomes  Long Term: Adherence to nutrition and physical activity/exercise program aimed toward attainment of established weight goal;Short Term: Continue to assess and modify interventions until short term weight is achieved;Weight Maintenance: Understanding of the daily nutrition guidelines, which includes 25-35% calories from fat, 7% or less cal from saturated fats, less than  cholesterol, less than 1.5gm of sodium, & 5 or more servings of fruits and vegetables daily;Weight Loss: Understanding of general recommendations for a balanced deficit meal plan, which promotes 1-2 lb weight loss per week and includes a  negative energy balance of [phone] kcal/d;Understanding recommendations for meals to include 15-35% energy as protein, 25-35% energy from fat, 35-60% energy from carbohydrates, less than  of dietary cholesterol, 20-35 gm of total fiber daily;Understanding of distribution of calorie intake throughout the day with the consumption of 4-5 meals/snacks            Personal Goals Discharge: Goals and Risk Factor Review    Core Components/Risk Factors/Patient Goals Review    Row Name 09/25/17 1632   Personal Goals Review  Weight Management/Obesity;Improve shortness of breath with ADL's;Diabetes;Hypertension;Heart Failure;Lipids;Stress   Review  pt with multiple CAD RF demonstrates willingness to participate in CR activities.  pt personal goals are to increase strength/stamina.    Expected Outcomes  pt will participate in CR exercise, nutrition and lifestyle modification activities to decrease overall RF.           Exercise Goals and Review: Exercise Goals    Exercise Goals    Row Name 09/19/17 1534   Increase Physical Activity  Yes   Intervention  Provide advice, education, support and counseling about physical activity/exercise needs.;Develop an individualized exercise prescription for aerobic and resistive training based on initial evaluation findings, risk stratification, comorbidities and participant's personal goals.   Expected Outcomes  Short Term: Attend rehab on a regular basis to increase amount of physical activity.;Long Term: Add in home exercise to make exercise part of routine and to increase amount of physical activity.;Long Term: Exercising regularly at least 3-5 days a week.   Increase Strength and Stamina  Yes   Intervention  Provide advice, education, support and counseling about physical activity/exercise needs.;Develop an individualized exercise prescription for aerobic and resistive training based on initial evaluation findings, risk stratification, comorbidities and  participant's personal goals.   Expected Outcomes  Short Term: Increase workloads from initial exercise prescription for resistance, speed, and METs.;Short Term: Perform resistance training exercises routinely during rehab and add in resistance training at home;Long Term: Improve cardiorespiratory fitness, muscular endurance and strength as measured by increased METs and functional capacity ( )   Able to understand and use rate of perceived exertion (RPE) scale  Yes   Intervention  Provide education and explanation on how to use RPE scale   Expected Outcomes  Short Term: Able to use RPE daily in rehab to express subjective intensity level;Long Term:  Able to use RPE to guide intensity level when exercising independently   Knowledge and understanding of Target Heart Rate Range (THRR)  Yes   Intervention  Provide education and explanation of THRR including how the numbers were predicted and where they are located for reference   Expected Outcomes  Short Term: Able to state/look up THRR;Long Term: Able to use THRR to govern intensity when exercising independently;Short Term: Able to use daily as guideline for intensity in rehab   Able to check pulse independently  Yes   Intervention  Provide education and demonstration on how to check pulse in carotid and radial arteries.;Review the importance of being able to check your own pulse for safety during independent exercise   Expected Outcomes  Short Term: Able to explain why pulse checking is important during independent exercise;Long Term: Able to check pulse independently and accurately   Understanding of Exercise Prescription  Yes   Intervention  Provide education, explanation, and written materials on patient's individual exercise prescription   Expected Outcomes  Short Term: Able to explain program exercise prescription;Long Term: Able to explain home exercise prescription to exercise independently          Nutrition & Weight - Outcomes: Pre  Biometrics - 09/19/17 1535    Pre Biometrics          Height  5' 6.5" (1.689 m)    Weight  182 lb 5.1 oz (82.7 kg)    Waist Circumference  38.5 inches    Hip Circumference  41 inches    Waist to Hip Ratio  0.94 %    BMI (Calculated)  28.99    Triceps Skinfold  27 mm    % Body Fat  30 %    Grip Strength  41.5 kg    Flexibility  0 in    Single Leg Stand  12 seconds            Nutrition: Nutrition Therapy & Goals - 09/25/17 1443    Nutrition Therapy          Diet  Heart Healthy        Intervention Plan          Intervention  Prescribe, educate and counsel regarding individualized specific dietary modifications aiming towards targeted core components such as weight, hypertension, lipid management, diabetes, heart failure and other comorbidities.    Expected Outcomes  Short Term Goal: Understand basic principles of dietary content, such as calories, fat, sodium, cholesterol and nutrients.;Long Term Goal: Adherence to prescribed nutrition plan.           Nutrition Discharge: Nutrition Assessments - 09/25/17 1443    MEDFICTS Scores          Pre Score  -- returned incomplete           Education Questionnaire Score: Knowledge Questionnaire Score - 09/19/17 1531    Knowledge Questionnaire Score          Pre Score  18/24           Goals reviewed with patient; copy given to patient.

## 2017-10-13 ENCOUNTER — Encounter (HOSPITAL_COMMUNITY): Payer: Medicare HMO

## 2017-10-16 ENCOUNTER — Encounter (HOSPITAL_COMMUNITY): Payer: Medicare HMO

## 2017-10-18 ENCOUNTER — Encounter (HOSPITAL_COMMUNITY): Payer: Medicare HMO

## 2017-10-20 ENCOUNTER — Ambulatory Visit: Admission: RE | Admit: 2017-10-20 | Payer: Medicare HMO | Source: Ambulatory Visit

## 2017-10-20 ENCOUNTER — Encounter (HOSPITAL_COMMUNITY): Payer: Medicare HMO

## 2017-10-20 ENCOUNTER — Encounter: Payer: Medicare HMO | Admitting: Acute Care

## 2017-10-25 ENCOUNTER — Encounter (HOSPITAL_COMMUNITY): Payer: Medicare HMO

## 2017-10-27 ENCOUNTER — Encounter (HOSPITAL_COMMUNITY): Payer: Medicare HMO

## 2017-10-30 ENCOUNTER — Encounter (HOSPITAL_COMMUNITY): Payer: Medicare HMO

## 2017-10-30 ENCOUNTER — Ambulatory Visit (INDEPENDENT_AMBULATORY_CARE_PROVIDER_SITE_OTHER): Payer: Medicare HMO | Admitting: Family Medicine

## 2017-10-30 ENCOUNTER — Encounter: Payer: Self-pay | Admitting: Family Medicine

## 2017-10-30 VITALS — BP 118/78 | HR 94 | Ht 67.0 in | Wt 164.0 lb

## 2017-10-30 DIAGNOSIS — R634 Abnormal weight loss: Secondary | ICD-10-CM

## 2017-10-30 DIAGNOSIS — Z09 Encounter for follow-up examination after completed treatment for conditions other than malignant neoplasm: Secondary | ICD-10-CM

## 2017-10-30 DIAGNOSIS — I10 Essential (primary) hypertension: Secondary | ICD-10-CM | POA: Diagnosis not present

## 2017-10-30 DIAGNOSIS — Z87891 Personal history of nicotine dependence: Secondary | ICD-10-CM

## 2017-10-30 DIAGNOSIS — R630 Anorexia: Secondary | ICD-10-CM

## 2017-10-30 NOTE — Addendum Note (Signed)
Encounter addended by: Robyne Peersion, Joann H, RN on: 10/30/2017 11:13 AM  Actions taken: Sign clinical note

## 2017-10-30 NOTE — Progress Notes (Signed)
Subjective:    Patient ID: [name], male    DOB: 24-Apr-1951, 66 y.o.   MRN: [id number]   PCP: Raliegh Ip, NP  Chief Complaint  Patient presents with  . Follow-up    3 month on chronic condition    Past Medical History:  Diagnosis Date  . ACS (acute coronary syndrome) (HCC)   . Acute anterior wall MI (HCC)   . Acute blood loss anemia   . Acute heart failure (HCC)   . Acute hypoxemic respiratory failure (HCC)   . Acute pulmonary edema (HCC)   . Acute respiratory failure with hypoxia (HCC)   . Aneurysm of left ventricle of heart 04/12/2017  . Anoxic brain injury (HCC)   . Bacteremia   . Benign essential HTN   . CAD in native artery   . Cardiogenic shock (HCC)   . CHF (congestive heart failure), NYHA class II, acute, combined (HCC) 04/11/2017  . Chronic neck pain   . Chronic systolic (congestive) heart failure (HCC)   . Confusion   . Congestive heart failure (CHF) (HCC)   . COPD (chronic obstructive pulmonary disease) (HCC) 04/11/2017  . Coronary artery disease   . Dysphagia   . Encounter for central line placement   . Encounter for management of intra-aortic balloon pump   . Essential hypertension   . Fever   . Hyperglycemia   . Hypertension   . Hypokalemia   . Hypotension due to drugs   . Hypoxemia   . Labile blood pressure   . Leukocytosis   . Physical debility 04/27/2017  . Rash 05/15/2017  . Sepsis (HCC)   . Sore throat   . STEMI (ST elevation myocardial infarction) (HCC) 04/11/2017  . Supplemental oxygen dependent   . Tachypnea   . 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 Esophageal cancer Mother     Social History   Socioeconomic History  . Marital status: Divorced    Spouse name: Not on file  . Number of children: Not on file  . Years of education: Not on file  . Highest education level: Not on file  Occupational History  . Not on file  Social  Needs  . Financial resource strain: Not on file  . Food insecurity:    Worry: Not on file    Inability: Not on file  . Transportation needs:    Medical: Not on file    Non-medical: Not on file  Tobacco Use  . Smoking status: Former Smoker    Types: Cigarettes    Last attempt to quit: 04/11/2017    Years since quitting: 0.5  . Smokeless tobacco: Never Used  Substance and Sexual Activity  . Alcohol use: Never    Frequency: Never  . Drug use: Never  . Sexual activity: Not on file  Lifestyle  . Physical activity:    Days per week: 0 days    Minutes per session: 0 min  . Stress: To some extent  Relationships  . Social connections:    Talks on phone: Not on file    Gets together: Not on file    Attends religious service: Not on file    Active member of club or organization: Not on file    Attends meetings of clubs or organizations: Not on file    Relationship status: Not on file  Other Topics Concern  . Not on file  Social  History Narrative  . Not on file   Past Surgical History:  Procedure Laterality Date  . APPLICATION OF WOUND VAC N/A 04/14/2017   Procedure: APPLICATION OF WOUND VAC with reposition of Impella device;  Surgeon: Delight Ovens, MD;  Location: [facility] OR;  Service: Thoracic;  Laterality: N/A;  . CARDIAC CATHETERIZATION    . CORONARY STENT INTERVENTION N/A 04/11/2017   Procedure: CORONARY STENT INTERVENTION;  Surgeon: Corky Crafts, MD;  Location: [facility] INVASIVE CV LAB;  Service: Cardiovascular;  Laterality: N/A;  . CORONARY THROMBECTOMY N/A 04/11/2017   Procedure: Coronary Thrombectomy;  Surgeon: Corky Crafts, MD;  Location: [facility] INVASIVE CV LAB;  Service: Cardiovascular;  Laterality: N/A;  . CORONARY/GRAFT ACUTE MI REVASCULARIZATION N/A 04/11/2017   Procedure: Coronary/Graft Acute MI Revascularization;  Surgeon: Corky Crafts, MD;  Location: [facility] INVASIVE CV LAB;  Service: Cardiovascular;  Laterality: N/A;  . IABP INSERTION N/A 04/11/2017    Procedure: IABP Insertion;  Surgeon: Corky Crafts, MD;  Location: [facility] INVASIVE CV LAB;  Service: Cardiovascular;  Laterality: N/A;  . LEFT HEART CATH AND CORONARY ANGIOGRAPHY N/A 04/11/2017   Procedure: LEFT HEART CATH AND CORONARY ANGIOGRAPHY;  Surgeon: Corky Crafts, MD;  Location: [facility] INVASIVE CV LAB;  Service: Cardiovascular;  Laterality: N/A;  . PLACEMENT OF IMPELLA LEFT VENTRICULAR ASSIST DEVICE  04/12/2017   Procedure: PLACEMENT OF IMPELLA  LD;  Surgeon: Delight Ovens, MD;  Location: [facility] OR;  Service: Open Heart Surgery;;  . REMOVAL OF IMPELLA LEFT VENTRICULAR ASSIST DEVICE N/A 04/17/2017   Procedure: REMOVAL OF IMPELLA LEFT VENTRICULAR ASSIST DEVICE;  Surgeon: Delight Ovens, MD;  Location: [facility] OR;  Service: Open Heart Surgery;  Laterality: N/A;  . REPAIR OF RIGHT VENTRICLE LACERATION Left 04/12/2017   Procedure: Resection of LV Aneurysm;  Surgeon: Delight Ovens, MD;  Location: [facility] OR;  Service: Open Heart Surgery;  Laterality: Left;  . STERNAL CLOSURE N/A 04/14/2017   Procedure: STERNAL CLOSURE;  Surgeon: Delight Ovens, MD;  Location: [facility] OR;  Service: Thoracic;  Laterality: N/A;  . TEE WITHOUT CARDIOVERSION N/A 04/14/2017   Procedure: TRANSESOPHAGEAL ECHOCARDIOGRAM (TEE);  Surgeon: Delight Ovens, MD;  Location: [facility] OR;  Service: Thoracic;  Laterality: N/A;  . ULTRASOUND GUIDANCE FOR VASCULAR ACCESS  04/11/2017   Procedure: Ultrasound Guidance For Vascular Access;  Surgeon: Corky Crafts, MD;  Location: [facility] INVASIVE CV LAB;  Service: Cardiovascular;;    There is no immunization history on file for this patient.  Current Meds  Medication Sig  . acetaminophen (TYLENOL) 325 MG tablet Take 1-2 tablets (325-650 mg total) by mouth every 4 (four) hours as needed for mild pain.  Marland Kitchen aspirin 81 MG chewable tablet Place 1 tablet (81 mg total) into feeding tube daily. (Patient taking differently: Chew 81 mg by mouth daily. )  . atorvastatin (LIPITOR) 80 MG  tablet Take 1 tablet (80 mg total) by mouth daily at 6 PM.  . carvedilol (COREG) 3.125 MG tablet Take 1 tablet (3.125 mg total) by mouth 2 (two) times daily.  . sacubitril-valsartan (ENTRESTO) 24-26 MG Take 1 tablet by mouth 2 (two) times daily.  . ticagrelor (BRILINTA) 90 MG TABS tablet Take 1 tablet (90 mg total) by mouth 2 (two) times daily.  . [DISCONTINUED] docusate sodium (COLACE) 100 MG capsule Take 100 mg by mouth 2 (two) times daily as needed for mild constipation.     Allergies  Allergen Reactions  . Other Other (See Comments)    Potatoes- Blurry eyes,  eyes hurt, and patient passes out (thinks he may bed sensitive)  . Tomato Other (See Comments)    Potatoes- Blurry eyes, eyes hurt, and patient passes out (thinks he may bed sensitive)    BP 118/78 (BP Location: Left Arm, Patient Position: Sitting, Cuff Size: Small)   Pulse 94   Ht 5\' 7"  (1.702 m)   Wt 164 lb (74.4 kg)   SpO2 99%   BMI 25.69 kg/m       Review of Systems  Constitutional: Negative.   HENT: Negative.   Eyes: Positive for visual disturbance.  Respiratory: Positive for cough and shortness of breath.   Cardiovascular: Negative.   Gastrointestinal: Negative.   Endocrine: Negative.   Genitourinary: Negative.   Musculoskeletal: Negative.   Skin: Negative.   Allergic/Immunologic: Negative.   Neurological: Positive for dizziness.  Hematological: Negative.   Psychiatric/Behavioral: Negative.    Objective:   Physical Exam  Constitutional: He is oriented to person, place, and time. He appears well-developed and well-nourished.  HENT:  Head: Normocephalic and atraumatic.  Eyes: Pupils are equal, round, and reactive to light. Conjunctivae and EOM are normal.  Neck: Normal range of motion. Neck supple.  Cardiovascular: Normal rate, regular rhythm, normal heart sounds and intact distal pulses.  Pulmonary/Chest: Effort normal. He has wheezes (inspiratory/expiratory).  Inspiratory/expiratory wheezes   Abdominal: Soft. Bowel sounds are normal.  Musculoskeletal: Normal range of motion.  Neurological: He is alert and oriented to person, place, and time.  Skin: Skin is warm and dry.  Psychiatric: He has a normal mood and affect. His behavior is normal. Judgment and thought content normal.  Nursing note and vitals reviewed.   Assessment & Plan:   1. Essential hypertension Blood pressure is 118/76 today. Continue to Carvedilol as directed.   2. History of smoking greater than 50 pack years He will schedule appointment for CT Chest for screening for Lung Cancer.   3. Weight loss He has lost 20 lbs in 3 months. We will refer him to Nutritionist today.   4. Follow up He will make appointment with GI for screening Colonoscopy. We will refer him to Nutritionist today.  He will follow up in 3 months.     No orders of the defined types were placed in this encounter.  Raliegh IpNatalie Ashland Wiseman,  MSN, FNP-BC [facility] [address]  [place], [place] [id number] [phone]

## 2017-10-30 NOTE — Addendum Note (Signed)
Encounter addended by: Robyne Peersion, [place]e Ciavarella H, RN on: 10/30/2017 11:15 AM  Actions taken: Episode resolved

## 2017-10-30 NOTE — Progress Notes (Signed)
Discharge Progress Report  Patient Details  Name: [name] MRN: [id number] Date of Birth: 07-15-1950 Referring Provider:   Flowsheet Row CARDIAC REHAB PHASE [facility]  Referring Provider  [name], Daniel MD       Number of Visits:2  Reason for Discharge:  Early Exit:  Personal, pt uanble to participate at this time  Smoking History:  Social History   Tobacco Use  Smoking Status Former Smoker  . Types: Cigarettes  . Last attempt to quit: 04/11/2017  . Years since quitting: 0.5  Smokeless Tobacco Never Used    Diagnosis:  ST elevation myocardial infarction (STEMI), unspecified artery (HCC)  Stented coronary artery  ADL UCSD:   Initial Exercise Prescription: Initial Exercise Prescription - 09/19/17 1500    Date of Initial Exercise RX and Referring Provider          Date  09/19/17    Referring Provider  Bensimhon, Daniel MD        Recumbant Bike          Level  1    Minutes  15    METs  1        NuStep          Level  1    SPM  50    Minutes  15    METs  1        Prescription Details          Frequency (times per week)  3    Duration  Progress to 30 minutes of continuous aerobic without signs/symptoms of physical distress        Intensity          THRR 40-80% of Max Heartrate  60-121    Ratings of Perceived Exertion  11-13    Perceived Dyspnea  0-4        Progression          Progression  Continue to progress workloads to maintain intensity without signs/symptoms of physical distress.        Resistance Training          Training Prescription  Yes    Weight  1lbs    Reps  10-15           Discharge Exercise Prescription (Final Exercise Prescription Changes): Exercise Prescription Changes - 09/25/17 1424    Response to Exercise          Blood Pressure (Admit)  138/68    Blood Pressure (Exercise)  138/64    Blood Pressure (Exit)  120/80    Heart Rate (Admit)  72  bpm    Heart Rate (Exercise)  85 bpm    Heart Rate (Exit)  77 bpm    Rating of Perceived Exertion (Exercise)  12    Symptoms  none    Comments  pt was oriented to exercise equipment today    Duration  Continue with 30 min of aerobic exercise without signs/symptoms of physical distress.    Intensity  THRR unchanged        Progression          Progression  Continue to progress workloads to maintain intensity without signs/symptoms of physical distress.    Average METs  1.8        Resistance Training          Training Prescription  Yes    Weight  1lbs    Reps  10-15    Time  10 Minutes        Recumbant Bike          Level  1    Minutes  15    METs  2.1        NuStep          Level  1    SPM  60    Minutes  15    METs  1.5           Functional Capacity: 6 Minute Walk    6 Minute Walk    Row Name 09/19/17 1520 09/19/17 1527   Phase  Initial  no documentation   Distance  400 feet  no documentation   Walk Time  5.3 minutes  no documentation   # of Rest Breaks  0  no documentation   MPH  no documentation  0.86   METS  no documentation  1   RPE  no documentation  13   Perceived Dyspnea   no documentation  1   VO2 Peak  no documentation  3.52   Symptoms  no documentation  Yes (comment)   Comments  no documentation  joint discomfort, SOB and back pain   Resting HR  no documentation  65 bpm   Resting BP  no documentation  112/72   Resting Oxygen Saturation   no documentation  98 %   Exercise Oxygen Saturation  during 6 min walk  no documentation  96 %   Max Ex. HR  no documentation  77 bpm   Max Ex. BP  no documentation  120/66   2 Minute Post BP  no documentation  118/60          Psychological, QOL, Others - Outcomes: PHQ 2/9: Depression screen [facility] 2/9 09/25/2017 07/28/2017 05/25/2017  Decreased Interest 0 0 0  Down, Depressed, Hopeless 0 0 0  PHQ - 2 Score 0 0 0  Altered sleeping - - 0  Tired, decreased energy - - 2  Change in appetite - - 0  Feeling  bad or failure about yourself  - - 0  Trouble concentrating - - 0  Moving slowly or fidgety/restless - - 0  Suicidal thoughts - - 0  PHQ-9 Score - - 2    Quality of Life: Quality of Life - 09/19/17 1526    Quality of Life Scores          Health/Function Pre  27.6 %    Socioeconomic Pre  22.5 %    Psych/Spiritual Pre  30 %    Family Pre  26.4 %    GLOBAL Pre  26.74 %           Personal Goals: Goals established at orientation with interventions provided to work toward goal. Personal Goals and Risk Factors at Admission - 09/19/17 1540    Core Components/Risk Factors/Patient Goals on Admission           Weight Management  Yes;Weight Maintenance;Weight Loss    Intervention  Weight Management: Develop a combined nutrition and exercise program designed to reach desired caloric intake, while maintaining appropriate intake of nutrient and fiber, sodium and fats, and appropriate energy expenditure required for the weight goal.;Weight Management: Provide education and appropriate resources to help participant work on and attain dietary goals.;Weight Management/Obesity: Establish reasonable short term and long term weight goals.    Admit Weight  182 lb 5.1 oz (82.7 kg)    Goal Weight: Short Term  175 lb (79.4 kg)  Goal Weight: Long Term  170 lb (77.1 kg)    Expected Outcomes  Long Term: Adherence to nutrition and physical activity/exercise program aimed toward attainment of established weight goal;Short Term: Continue to assess and modify interventions until short term weight is achieved;Weight Maintenance: Understanding of the daily nutrition guidelines, which includes 25-35% calories from fat, 7% or less cal from saturated fats, less than 200mg  cholesterol, less than 1.5gm of sodium, & 5 or more servings of fruits and vegetables daily;Weight Loss: Understanding of general recommendations for a balanced deficit meal plan, which promotes 1-2 lb weight loss per week and includes a negative energy  balance of [phone] kcal/d;Understanding recommendations for meals to include 15-35% energy as protein, 25-35% energy from fat, 35-60% energy from carbohydrates, less than 200mg  of dietary cholesterol, 20-35 gm of total fiber daily;Understanding of distribution of calorie intake throughout the day with the consumption of 4-5 meals/snacks            Personal Goals Discharge: Goals and Risk Factor Review    Core Components/Risk Factors/Patient Goals Review    Row Name 09/25/17 1632   Personal Goals Review  Weight Management/Obesity;Improve shortness of breath with ADL's;Diabetes;Hypertension;Heart Failure;Lipids;Stress   Review  pt with multiple CAD RF demonstrates willingness to participate in CR activities.  pt personal goals are to increase strength/stamina.    Expected Outcomes  pt will participate in CR exercise, nutrition and lifestyle modification activities to decrease overall RF.           Exercise Goals and Review: Exercise Goals    Exercise Goals    Row Name 09/19/17 1534   Increase Physical Activity  Yes   Intervention  Provide advice, education, support and counseling about physical activity/exercise needs.;Develop an individualized exercise prescription for aerobic and resistive training based on initial evaluation findings, risk stratification, comorbidities and participant's personal goals.   Expected Outcomes  Short Term: Attend rehab on a regular basis to increase amount of physical activity.;Long Term: Add in home exercise to make exercise part of routine and to increase amount of physical activity.;Long Term: Exercising regularly at least 3-5 days a week.   Increase Strength and Stamina  Yes   Intervention  Provide advice, education, support and counseling about physical activity/exercise needs.;Develop an individualized exercise prescription for aerobic and resistive training based on initial evaluation findings, risk stratification, comorbidities and participant's  personal goals.   Expected Outcomes  Short Term: Increase workloads from initial exercise prescription for resistance, speed, and METs.;Short Term: Perform resistance training exercises routinely during rehab and add in resistance training at home;Long Term: Improve cardiorespiratory fitness, muscular endurance and strength as measured by increased METs and functional capacity ( )   Able to understand and use rate of perceived exertion (RPE) scale  Yes   Intervention  Provide education and explanation on how to use RPE scale   Expected Outcomes  Short Term: Able to use RPE daily in rehab to express subjective intensity level;Long Term:  Able to use RPE to guide intensity level when exercising independently   Knowledge and understanding of Target Heart Rate Range (THRR)  Yes   Intervention  Provide education and explanation of THRR including how the numbers were predicted and where they are located for reference   Expected Outcomes  Short Term: Able to state/look up THRR;Long Term: Able to use THRR to govern intensity when exercising independently;Short Term: Able to use daily as guideline for intensity in rehab   Able to check pulse independently  Yes  Intervention  Provide education and demonstration on how to check pulse in carotid and radial arteries.;Review the importance of being able to check your own pulse for safety during independent exercise   Expected Outcomes  Short Term: Able to explain why pulse checking is important during independent exercise;Long Term: Able to check pulse independently and accurately   Understanding of Exercise Prescription  Yes   Intervention  Provide education, explanation, and written materials on patient's individual exercise prescription   Expected Outcomes  Short Term: Able to explain program exercise prescription;Long Term: Able to explain home exercise prescription to exercise independently          Nutrition & Weight - Outcomes: Pre Biometrics -  09/19/17 1535    Pre Biometrics          Height  5' 6.5" (1.689 m)    Weight  182 lb 5.1 oz (82.7 kg)    Waist Circumference  38.5 inches    Hip Circumference  41 inches    Waist to Hip Ratio  0.94 %    BMI (Calculated)  28.99    Triceps Skinfold  27 mm    % Body Fat  30 %    Grip Strength  41.5 kg    Flexibility  0 in    Single Leg Stand  12 seconds            Nutrition: Nutrition Therapy & Goals - 09/25/17 1443    Nutrition Therapy          Diet  Heart Healthy        Intervention Plan          Intervention  Prescribe, educate and counsel regarding individualized specific dietary modifications aiming towards targeted core components such as weight, hypertension, lipid management, diabetes, heart failure and other comorbidities.    Expected Outcomes  Short Term Goal: Understand basic principles of dietary content, such as calories, fat, sodium, cholesterol and nutrients.;Long Term Goal: Adherence to prescribed nutrition plan.           Nutrition Discharge: Nutrition Assessments - 09/25/17 1443    MEDFICTS Scores          Pre Score  -- returned incomplete           Education Questionnaire Score: Knowledge Questionnaire Score - 09/19/17 1531    Knowledge Questionnaire Score          Pre Score  18/24           Goals reviewed with patient; copy given to patient.

## 2017-10-30 NOTE — Progress Notes (Signed)
poct

## 2017-10-31 ENCOUNTER — Telehealth (HOSPITAL_COMMUNITY): Payer: Self-pay | Admitting: *Deleted

## 2017-10-31 MED ORDER — SACUBITRIL-VALSARTAN 24-26 MG PO TABS: 1.0000 | ORAL_TABLET | Freq: Two times a day (BID) | ORAL | 3 refills | Status: DC

## 2017-10-31 MED ORDER — TICAGRELOR 90 MG PO TABS: 90.0000 mg | ORAL_TABLET | Freq: Two times a day (BID) | ORAL | 2 refills | Status: DC

## 2017-10-31 NOTE — Telephone Encounter (Signed)
Pt called for refill of Entresto and Brilinta. He requested only 30day supply scripts for both meds. Meds sent to [facility].

## 2017-11-01 ENCOUNTER — Encounter (HOSPITAL_COMMUNITY): Payer: Medicare HMO

## 2017-11-01 DIAGNOSIS — M542 Cervicalgia: Secondary | ICD-10-CM | POA: Diagnosis not present

## 2017-11-01 DIAGNOSIS — M4810 Ankylosing hyperostosis [Forestier], site unspecified: Secondary | ICD-10-CM | POA: Diagnosis not present

## 2017-11-01 DIAGNOSIS — M5136 Other intervertebral disc degeneration, lumbar region: Secondary | ICD-10-CM | POA: Diagnosis not present

## 2017-11-02 DIAGNOSIS — M51369 Other intervertebral disc degeneration, lumbar region without mention of lumbar back pain or lower extremity pain: Secondary | ICD-10-CM | POA: Insufficient documentation

## 2017-11-02 DIAGNOSIS — M4810 Ankylosing hyperostosis [Forestier], site unspecified: Secondary | ICD-10-CM

## 2017-11-02 HISTORY — DX: Other intervertebral disc degeneration, lumbar region without mention of lumbar back pain or lower extremity pain: M51.369

## 2017-11-02 HISTORY — DX: Ankylosing hyperostosis (forestier), site unspecified: M48.10

## 2017-11-03 ENCOUNTER — Encounter (HOSPITAL_COMMUNITY): Payer: Medicare HMO

## 2017-11-06 ENCOUNTER — Encounter (HOSPITAL_COMMUNITY): Payer: Medicare HMO

## 2017-11-06 ENCOUNTER — Encounter: Payer: Self-pay | Admitting: Gastroenterology

## 2017-11-08 ENCOUNTER — Encounter (HOSPITAL_COMMUNITY): Payer: Medicare HMO

## 2017-11-08 DIAGNOSIS — I129 Hypertensive chronic kidney disease with stage 1 through stage 4 chronic kidney disease, or unspecified chronic kidney disease: Secondary | ICD-10-CM | POA: Diagnosis not present

## 2017-11-08 DIAGNOSIS — J449 Chronic obstructive pulmonary disease, unspecified: Secondary | ICD-10-CM | POA: Diagnosis not present

## 2017-11-08 DIAGNOSIS — I213 ST elevation (STEMI) myocardial infarction of unspecified site: Secondary | ICD-10-CM | POA: Diagnosis not present

## 2017-11-08 DIAGNOSIS — N183 Chronic kidney disease, stage 3 (moderate): Secondary | ICD-10-CM | POA: Diagnosis not present

## 2017-11-10 ENCOUNTER — Encounter (HOSPITAL_COMMUNITY): Payer: Medicare HMO

## 2017-11-10 DIAGNOSIS — G931 Anoxic brain damage, not elsewhere classified: Secondary | ICD-10-CM | POA: Diagnosis not present

## 2017-11-10 DIAGNOSIS — R5381 Other malaise: Secondary | ICD-10-CM | POA: Diagnosis not present

## 2017-11-10 DIAGNOSIS — J9601 Acute respiratory failure with hypoxia: Secondary | ICD-10-CM | POA: Diagnosis not present

## 2017-11-13 ENCOUNTER — Other Ambulatory Visit: Payer: Self-pay | Admitting: Nephrology

## 2017-11-13 ENCOUNTER — Encounter (HOSPITAL_COMMUNITY): Payer: Medicare HMO

## 2017-11-13 DIAGNOSIS — I129 Hypertensive chronic kidney disease with stage 1 through stage 4 chronic kidney disease, or unspecified chronic kidney disease: Secondary | ICD-10-CM

## 2017-11-15 ENCOUNTER — Encounter (HOSPITAL_COMMUNITY): Payer: Medicare HMO

## 2017-11-17 ENCOUNTER — Encounter (HOSPITAL_COMMUNITY): Payer: Medicare HMO

## 2017-11-20 ENCOUNTER — Encounter (HOSPITAL_COMMUNITY): Payer: Medicare HMO

## 2017-11-20 ENCOUNTER — Encounter: Payer: Medicare HMO | Attending: Family Medicine | Admitting: Dietician

## 2017-11-20 ENCOUNTER — Encounter: Payer: Self-pay | Admitting: Dietician

## 2017-11-20 DIAGNOSIS — R634 Abnormal weight loss: Secondary | ICD-10-CM | POA: Diagnosis present

## 2017-11-20 DIAGNOSIS — Z713 Dietary counseling and surveillance: Secondary | ICD-10-CM | POA: Insufficient documentation

## 2017-11-20 DIAGNOSIS — E441 Mild protein-calorie malnutrition: Secondary | ICD-10-CM

## 2017-11-20 DIAGNOSIS — I5041 Acute combined systolic (congestive) and diastolic (congestive) heart failure: Secondary | ICD-10-CM

## 2017-11-20 NOTE — Patient Instructions (Addendum)
Consider Vitamin D3 1000 units daily. Avoid added salt and canned processed meat Breakfast, lunch, and dinner daily with 2-3 snacks per day.  Snacks could be a protein shake. Consider good sources of iron such as cream of wheat or other cereal such as raisin bran, total, or cheerios and eat with strawberries or an orange Choose healthier kinds of fats such as unsalted nuts, natural peanut butter, avocados, olive oil  Drink 2 protein shakes per day.  Ensure  Muscle milk  Carnation Breakfast

## 2017-11-20 NOTE — Progress Notes (Signed)
Medical Nutrition Therapy:  Appt start time: 1406 end time:  1520.   Assessment:  Primary concerns today: patient is here today with his sister.  He had CABG surgery 03/2017 after an MI.  He had lost a lot of weight.  Other history includes HTN, MI, CHF, COPD, hyperglycemia.    Weight Hx: 235 lbs piror to CABG 03/2017 186 lbs 06/2017 164 lbs 10/30/17 173 lbs today Height 69" in the past and now 67". He has lost 70 lbs in the past 7 months. Weight has improved since he is staying with his sister.  His hemoglobin remains low.  He grazes and does drink 2-4 Ensure daily.  He generally eats a good dinner.   Patient states that he lost weight at the beginning of the year as both of his vehicles broke down (lost transportation), stopped caring, and his refrigerator stopped working.  He is currently using a dorm size refrigerator but is going to look at them today.    Patient generally lives with a long term partner and her daughter.  He is a retired Nutritional therapistplumber, Personnel officerelectrician, and Corporate investment bankerconstruction worker.  He is allergic to foods in the nightshade family (tomatoes, potatoes, eggplant, peppers, blueberries).  He has been trying to eat healthfully recently.  Preferred Learning Style:   No preference indicated   Learning Readiness:   Ready  Change in progress   MEDICATIONS: see list   DIETARY INTAKE:  Usual eating pattern includes 1 meals and 4 snacks per day. Avoided foods include decreased pork and beef and increased fish and chicken.  Allergic to foods in the nightshade family (tomatoes, potatoes, peppers, eggplant, blueberries).  24-hr recall:  B ( AM): 2 boiled eggs, rice, field peas, figs and dates, applesauce, 2 packs granola bars,  Watermelon Since 5:30 this am. occasional yogurt, pudding cup- grazes and no formal meals.   D ( PM): legumes and rice, greens OR [employer] OR salad OR spaghetti and meatballs OR chicken, , OR Chinese OR fried fish sandwich Snk ( PM): none Beverages: 2-4 Ensure  per day  Usual physical activity: walks, exercises from home health- he states he is to go to cardiac rehab again in July  Estimated energy needs: 2000 calories 100 g protein  Progress Towards Goal(s):  In progress.   Nutritional Diagnosis:  NB-1.1 Food and nutrition-related knowledge deficit As related to adequate oral intake to maintain nutritional status.  As evidenced by weight loss of 70 lbs in the past 7 months..    Intervention:  Nutrition counseling/education related to tips to improve nutritional status and obtain adequate protein.  Discussed tips to improve iron intake and absorption and sources of unsaturated fats.  Encouraged continued supplementation.  Patient requested information regarding vitamins.  Discussed that he could take vitamin D daily as many are low in this and he does not get food sources of this vitamin or sunlight.  Consider Vitamin D3 1000 units daily. Avoid added salt and canned processed meat Breakfast, lunch, and dinner daily with 2-3 snacks per day.  Snacks could be a protein shake. Consider good sources of iron such as cream of wheat or other cereal such as raisin bran, total, or cheerios and eat with strawberries or an orange Choose healthier kinds of fats such as unsalted nuts, natural peanut butter, avocados, olive oil  Drink 2 protein shakes per day.  Ensure  Muscle milk  Carnation Breakfast  Teaching Method Utilized:  Visual Auditory Hands on  Handouts given during visit include:  Hearth Healthy Nutrition Therapy from AND  Low sodium nutrition therapy from AND  Iron Rich Sources  Barriers to learning/adherence to lifestyle change: lack of adequate refrigeration, lack of ability to care well for himself currently, lack of transportation except for family  Demonstrated degree of understanding via:  Teach Back   Monitoring/Evaluation:  Dietary intake, exercise, and body weight prn.

## 2017-11-22 ENCOUNTER — Encounter (HOSPITAL_COMMUNITY): Payer: Medicare HMO

## 2017-11-22 ENCOUNTER — Telehealth: Payer: Self-pay | Admitting: Family Medicine
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
## 2017-11-24 ENCOUNTER — Encounter (HOSPITAL_COMMUNITY): Payer: Medicare HMO

## 2017-11-27 ENCOUNTER — Encounter (HOSPITAL_COMMUNITY): Payer: Medicare HMO

## 2017-11-27 NOTE — Telephone Encounter (Signed)
Please help call the pt 

## 2017-11-27 NOTE — Telephone Encounter (Signed)
This test is not offered as far as I am aware.

## 2017-11-27 NOTE — Telephone Encounter (Signed)
Spoke to pt and he states he has an appt with another Dr. Concerning this issue. He will call and schedule with Dr Ashley RoyaltyMatthews if his other Dr deems necessary.

## 2017-11-28 ENCOUNTER — Ambulatory Visit
Admission: RE | Admit: 2017-11-28 | Discharge: 2017-11-28 | Disposition: A | Discharge status: Home / Self Care | Payer: Medicare HMO | Source: Ambulatory Visit | Attending: Nephrology | Admitting: Nephrology

## 2017-11-28 DIAGNOSIS — I129 Hypertensive chronic kidney disease with stage 1 through stage 4 chronic kidney disease, or unspecified chronic kidney disease: Secondary | ICD-10-CM

## 2017-11-28 DIAGNOSIS — N189 Chronic kidney disease, unspecified: Secondary | ICD-10-CM | POA: Diagnosis not present

## 2017-11-29 ENCOUNTER — Encounter (HOSPITAL_COMMUNITY): Payer: Medicare HMO

## 2017-12-01 ENCOUNTER — Encounter (HOSPITAL_COMMUNITY): Payer: Medicare HMO

## 2017-12-04 ENCOUNTER — Encounter (HOSPITAL_COMMUNITY): Payer: Medicare HMO

## 2017-12-06 ENCOUNTER — Encounter (HOSPITAL_COMMUNITY): Payer: Medicare HMO

## 2017-12-08 ENCOUNTER — Encounter (HOSPITAL_COMMUNITY): Payer: Medicare HMO

## 2017-12-08 ENCOUNTER — Ambulatory Visit (INDEPENDENT_AMBULATORY_CARE_PROVIDER_SITE_OTHER): Payer: Medicare HMO | Admitting: Acute Care

## 2017-12-08 ENCOUNTER — Encounter: Payer: Self-pay | Admitting: Acute Care

## 2017-12-08 ENCOUNTER — Ambulatory Visit (INDEPENDENT_AMBULATORY_CARE_PROVIDER_SITE_OTHER)
Admission: RE | Admit: 2017-12-08 | Discharge: 2017-12-08 | Disposition: A | Discharge status: Home / Self Care | Payer: Medicare HMO | Source: Ambulatory Visit | Attending: Acute Care | Admitting: Acute Care

## 2017-12-08 ENCOUNTER — Telehealth: Payer: Self-pay | Admitting: Acute Care

## 2017-12-08 DIAGNOSIS — Z87891 Personal history of nicotine dependence: Secondary | ICD-10-CM

## 2017-12-08 DIAGNOSIS — Z122 Encounter for screening for malignant neoplasm of respiratory organs: Secondary | ICD-10-CM

## 2017-12-08 NOTE — Progress Notes (Signed)
Shared Decision Making Visit Lung Cancer Screening Program [phone])   Eligibility:  Age 67 y.o.  Pack Years Smoking History Calculation 80 pack year smoking history (# packs/per year x # years smoked)  Recent History of coughing up blood  no  Unexplained weight loss? no ( >Than 15 pounds within the last 6 months )  Prior History Lung / other cancer no (Diagnosis within the last 5 years already requiring surveillance chest CT Scans).  Smoking Status Former Smoker  Former Smokers: Years since quit: < 1 year  Quit Date: 04/11/2017  Visit Components:  Discussion included one or more decision making aids. yes  Discussion included risk/benefits of screening. yes  Discussion included potential follow up diagnostic testing for abnormal scans. yes  Discussion included meaning and risk of over diagnosis. yes  Discussion included meaning and risk of False Positives. yes  Discussion included meaning of total radiation exposure. yes  Counseling Included:  Importance of adherence to annual lung cancer LDCT screening. yes  Impact of comorbidities on ability to participate in the program. yes  Ability and willingness to under diagnostic treatment. yes  Smoking Cessation Counseling:  Current Smokers:   Discussed importance of smoking cessation. NA  Information about tobacco cessation classes and interventions provided to patient. yes  Patient provided with "ticket" for LDCT Scan. yes  Symptomatic Patient. no  Counseling  Diagnosis Code: Tobacco Use Z72.0  Asymptomatic Patient yes  Counseling (Intermediate counseling: > three minutes counseling) [id number]  Former Smokers:   Discussed the importance of maintaining cigarette abstinence. yes  Diagnosis Code: Personal History of Nicotine Dependence. W29.562  Information about tobacco cessation classes and interventions provided to patient. Yes  Patient provided with "ticket" for LDCT Scan. yes  Written Order for Lung  Cancer Screening with LDCT placed in Epic. Yes (CT Chest Lung Cancer Screening Low Dose W/O CM) [id number] Z12.2-Screening of respiratory organs Z87.891-Personal history of nicotine dependence  I spent 25 minutes of face to face time with [name] discussing the risks and benefits of lung cancer screening. We viewed a power point together that explained in detail the above noted topics. We took the time to pause the power point at intervals to allow for questions to be asked and answered to ensure understanding. We discussed that he had taken the single most powerful action possible to decrease his risk of developing lung cancer when he quit smoking. I counseled him to remain smoke free, and to contact me if he ever had the desire to smoke again so that I can provide resources and tools to help support the effort to remain smoke free. We discussed the time and location of the scan, and that either  Abigail Miyamoto RN or I will call with the results within  24-48 hours of receiving them. He has my card and contact information in the event he needs to speak with me, in addition to a copy of the power point we reviewed as a resource. He verbalized understanding of all of the above and had no further questions upon leaving the office.     I explained to the patient that there has been a high incidence of coronary artery disease noted on these exams. I explained that this is a non-gated exam therefore degree or severity cannot be determined. This patient is currently on statin therapy. I have asked the patient to follow-up with their PCP regarding any incidental finding of coronary artery disease and management with diet or medication as they feel is  clinically indicated. The patient verbalized understanding of the above and had no further questions.     Ronald NgoSarah F Akif Weldy, NP  12/08/2017 4:11 PM

## 2017-12-08 NOTE — Telephone Encounter (Signed)
Spoke with pt. I have explained him why he has to have the shared decision making visit. Nothing further was needed.

## 2017-12-10 DIAGNOSIS — G931 Anoxic brain damage, not elsewhere classified: Secondary | ICD-10-CM | POA: Diagnosis not present

## 2017-12-10 DIAGNOSIS — R5381 Other malaise: Secondary | ICD-10-CM | POA: Diagnosis not present

## 2017-12-10 DIAGNOSIS — J9601 Acute respiratory failure with hypoxia: Secondary | ICD-10-CM | POA: Diagnosis not present

## 2017-12-11 ENCOUNTER — Encounter (HOSPITAL_COMMUNITY): Payer: Medicare HMO

## 2017-12-11 ENCOUNTER — Other Ambulatory Visit (HOSPITAL_COMMUNITY): Payer: Self-pay | Admitting: Internal Medicine

## 2017-12-13 ENCOUNTER — Encounter (HOSPITAL_COMMUNITY): Payer: Medicare HMO

## 2017-12-15 ENCOUNTER — Encounter (HOSPITAL_COMMUNITY): Payer: Medicare HMO

## 2017-12-18 ENCOUNTER — Encounter (HOSPITAL_COMMUNITY): Payer: Medicare HMO

## 2017-12-18 DIAGNOSIS — H2513 Age-related nuclear cataract, bilateral: Secondary | ICD-10-CM | POA: Diagnosis not present

## 2017-12-18 DIAGNOSIS — H3581 Retinal edema: Secondary | ICD-10-CM | POA: Diagnosis not present

## 2017-12-18 DIAGNOSIS — H25013 Cortical age-related cataract, bilateral: Secondary | ICD-10-CM | POA: Diagnosis not present

## 2017-12-18 DIAGNOSIS — H35033 Hypertensive retinopathy, bilateral: Secondary | ICD-10-CM | POA: Diagnosis not present

## 2017-12-19 ENCOUNTER — Telehealth (HOSPITAL_COMMUNITY): Payer: Self-pay | Admitting: Pharmacist

## 2017-12-19 ENCOUNTER — Other Ambulatory Visit: Payer: Self-pay | Admitting: Acute Care

## 2017-12-19 DIAGNOSIS — Z87891 Personal history of nicotine dependence: Secondary | ICD-10-CM

## 2017-12-19 DIAGNOSIS — Z122 Encounter for screening for malignant neoplasm of respiratory organs: Secondary | ICD-10-CM

## 2017-12-19 NOTE — Telephone Encounter (Signed)
Cardiac Rehab Medication Review by a Pharmacist  Does the patient  feel that his/her medications are working for him/her?  Patient states he does not know if his medications are working or not. He states he is concerned taking medications will cause him harm in the future.   Has the patient been experiencing any side effects to the medications prescribed?  Yes. Patient states he feels weak but unsure which medication is causing or if it is related to his medication.   Does the patient measure his/her own blood pressure or blood glucose at home?  yes   Does the patient have any problems obtaining medications due to transportation or finances?   no  Understanding of regimen: good Understanding of indications: good Potential of compliance: good    Pharmacist comments: Patient states his last BP reading was 92/67. He states he has been running low but concerned that his BP readings have not been accurate at home. Patient does state that he feels weaker when his blood pressure is low.   Ronald AlbrightSara Elyon [name], Ilda BassetPharm D PGY1 Pharmacy Resident  Phone [phone] 12/19/2017   4:57 PM

## 2017-12-20 ENCOUNTER — Encounter (HOSPITAL_COMMUNITY): Payer: Medicare HMO

## 2017-12-21 ENCOUNTER — Ambulatory Visit (INDEPENDENT_AMBULATORY_CARE_PROVIDER_SITE_OTHER): Payer: Medicare HMO | Admitting: Interventional Cardiology

## 2017-12-21 ENCOUNTER — Encounter: Payer: Self-pay | Admitting: Interventional Cardiology

## 2017-12-21 VITALS — BP 102/48 | HR 78 | Ht 67.0 in | Wt 179.0 lb

## 2017-12-21 DIAGNOSIS — I2510 Atherosclerotic heart disease of native coronary artery without angina pectoris: Secondary | ICD-10-CM

## 2017-12-21 DIAGNOSIS — I480 Paroxysmal atrial fibrillation: Secondary | ICD-10-CM

## 2017-12-21 DIAGNOSIS — I252 Old myocardial infarction: Secondary | ICD-10-CM

## 2017-12-21 DIAGNOSIS — N183 Chronic kidney disease, stage 3 unspecified: Secondary | ICD-10-CM

## 2017-12-21 DIAGNOSIS — I5022 Chronic systolic (congestive) heart failure: Secondary | ICD-10-CM
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
## 2017-12-21 NOTE — Patient Instructions (Signed)

## 2017-12-22 ENCOUNTER — Encounter (HOSPITAL_COMMUNITY): Payer: Medicare HMO

## 2017-12-25 ENCOUNTER — Encounter (HOSPITAL_COMMUNITY): Payer: Medicare HMO

## 2017-12-26 ENCOUNTER — Telehealth (HOSPITAL_COMMUNITY): Payer: Self-pay

## 2017-12-26 ENCOUNTER — Inpatient Hospital Stay (HOSPITAL_COMMUNITY): Admission: RE | Admit: 2017-12-26 | Payer: Medicare HMO | Source: Ambulatory Visit

## 2017-12-26 NOTE — Telephone Encounter (Signed)
Called patient to inform him that Medicaid has expired. Patient stated he was not going to make it to orientation today anyway. Informed patient that since this is 2nd reschedule he will have to call when and if he is ready. Cancelled all appointments.

## 2017-12-27 ENCOUNTER — Encounter (HOSPITAL_COMMUNITY): Payer: Medicare HMO

## 2018-01-01 ENCOUNTER — Ambulatory Visit: Payer: Medicare HMO | Admitting: Gastroenterology

## 2018-01-01 ENCOUNTER — Ambulatory Visit (HOSPITAL_COMMUNITY): Payer: Medicare HMO

## 2018-01-03 ENCOUNTER — Ambulatory Visit (HOSPITAL_COMMUNITY): Payer: Medicare HMO

## 2018-01-05 ENCOUNTER — Ambulatory Visit (HOSPITAL_COMMUNITY): Payer: Medicare HMO

## 2018-01-05 ENCOUNTER — Encounter: Payer: Self-pay | Admitting: Family Medicine

## 2018-01-08 ENCOUNTER — Ambulatory Visit (HOSPITAL_COMMUNITY): Payer: Medicare HMO

## 2018-01-10 ENCOUNTER — Ambulatory Visit (HOSPITAL_COMMUNITY): Payer: Medicare HMO

## 2018-01-10 DIAGNOSIS — G931 Anoxic brain damage, not elsewhere classified: Secondary | ICD-10-CM | POA: Diagnosis not present

## 2018-01-10 DIAGNOSIS — J9601 Acute respiratory failure with hypoxia: Secondary | ICD-10-CM | POA: Diagnosis not present

## 2018-01-10 DIAGNOSIS — R5381 Other malaise: Secondary | ICD-10-CM | POA: Diagnosis not present

## 2018-01-12 ENCOUNTER — Ambulatory Visit (HOSPITAL_COMMUNITY): Payer: Medicare HMO

## 2018-01-15 ENCOUNTER — Ambulatory Visit (HOSPITAL_COMMUNITY): Payer: Medicare HMO

## 2018-01-17 ENCOUNTER — Ambulatory Visit (HOSPITAL_COMMUNITY): Payer: Medicare HMO

## 2018-01-19 ENCOUNTER — Ambulatory Visit (HOSPITAL_COMMUNITY): Payer: Medicare HMO

## 2018-01-22 ENCOUNTER — Ambulatory Visit (HOSPITAL_COMMUNITY): Payer: Medicare HMO

## 2018-01-24 ENCOUNTER — Ambulatory Visit (HOSPITAL_COMMUNITY): Payer: Medicare HMO

## 2018-01-26 ENCOUNTER — Ambulatory Visit (HOSPITAL_COMMUNITY): Payer: Medicare HMO

## 2018-01-30 ENCOUNTER — Ambulatory Visit: Payer: Medicare HMO | Admitting: Family Medicine

## 2018-01-31 ENCOUNTER — Ambulatory Visit (HOSPITAL_COMMUNITY): Payer: Medicare HMO

## 2018-02-02 ENCOUNTER — Ambulatory Visit (HOSPITAL_COMMUNITY): Payer: Medicare HMO

## 2018-02-05 ENCOUNTER — Ambulatory Visit (HOSPITAL_COMMUNITY): Payer: Medicare HMO

## 2018-02-07 ENCOUNTER — Ambulatory Visit (HOSPITAL_COMMUNITY): Payer: Medicare HMO

## 2018-02-09 ENCOUNTER — Ambulatory Visit (HOSPITAL_COMMUNITY): Payer: Medicare HMO

## 2018-02-12 ENCOUNTER — Ambulatory Visit (HOSPITAL_COMMUNITY): Payer: Medicare HMO

## 2018-02-14 ENCOUNTER — Ambulatory Visit (HOSPITAL_COMMUNITY): Payer: Medicare HMO

## 2018-02-16 ENCOUNTER — Ambulatory Visit (HOSPITAL_COMMUNITY): Payer: Medicare HMO

## 2018-02-19 ENCOUNTER — Ambulatory Visit (HOSPITAL_COMMUNITY): Payer: Medicare HMO

## 2018-02-21 ENCOUNTER — Ambulatory Visit (HOSPITAL_COMMUNITY): Payer: Medicare HMO

## 2018-02-23 ENCOUNTER — Ambulatory Visit (HOSPITAL_COMMUNITY): Payer: Medicare HMO

## 2018-02-26 ENCOUNTER — Ambulatory Visit (HOSPITAL_COMMUNITY): Payer: Medicare HMO

## 2018-02-28 ENCOUNTER — Ambulatory Visit (HOSPITAL_COMMUNITY): Payer: Medicare HMO

## 2018-03-02 ENCOUNTER — Ambulatory Visit (HOSPITAL_COMMUNITY): Payer: Medicare HMO

## 2018-03-05 ENCOUNTER — Ambulatory Visit (HOSPITAL_COMMUNITY): Payer: Medicare HMO

## 2018-03-07 ENCOUNTER — Ambulatory Visit (HOSPITAL_COMMUNITY): Payer: Medicare HMO

## 2018-03-09 ENCOUNTER — Ambulatory Visit (HOSPITAL_COMMUNITY): Payer: Medicare HMO

## 2018-03-12 ENCOUNTER — Ambulatory Visit (HOSPITAL_COMMUNITY): Payer: Medicare HMO

## 2018-03-12 ENCOUNTER — Other Ambulatory Visit (HOSPITAL_COMMUNITY): Payer: Self-pay | Admitting: Internal Medicine

## 2018-03-14 ENCOUNTER — Ambulatory Visit (HOSPITAL_COMMUNITY): Payer: Medicare HMO

## 2018-03-16 ENCOUNTER — Ambulatory Visit (HOSPITAL_COMMUNITY): Payer: Medicare HMO

## 2018-03-19 ENCOUNTER — Ambulatory Visit (HOSPITAL_COMMUNITY): Payer: Medicare HMO

## 2018-03-21 ENCOUNTER — Ambulatory Visit (HOSPITAL_COMMUNITY): Payer: Medicare HMO

## 2018-03-22 ENCOUNTER — Encounter: Payer: Medicare HMO | Admitting: Physical Medicine & Rehabilitation

## 2018-03-23 ENCOUNTER — Ambulatory Visit (HOSPITAL_COMMUNITY): Payer: Medicare HMO

## 2018-03-26 ENCOUNTER — Ambulatory Visit (HOSPITAL_COMMUNITY): Payer: Medicare HMO

## 2018-03-28 ENCOUNTER — Ambulatory Visit (HOSPITAL_COMMUNITY): Payer: Medicare HMO

## 2018-03-28 ENCOUNTER — Ambulatory Visit: Payer: Medicare HMO | Admitting: Physical Medicine & Rehabilitation

## 2018-03-30 ENCOUNTER — Ambulatory Visit (HOSPITAL_COMMUNITY): Payer: Medicare HMO

## 2018-04-02 ENCOUNTER — Ambulatory Visit (HOSPITAL_COMMUNITY): Payer: Medicare HMO

## 2018-04-04 ENCOUNTER — Ambulatory Visit (HOSPITAL_COMMUNITY): Payer: Medicare HMO

## 2018-05-04 ENCOUNTER — Other Ambulatory Visit (HOSPITAL_COMMUNITY): Payer: Self-pay | Admitting: Internal Medicine

## 2018-06-04 ENCOUNTER — Other Ambulatory Visit (HOSPITAL_COMMUNITY): Payer: Self-pay | Admitting: Internal Medicine

## 2018-06-14 ENCOUNTER — Encounter: Payer: Medicare HMO | Admitting: Physical Medicine & Rehabilitation

## 2018-06-22 ENCOUNTER — Other Ambulatory Visit (HOSPITAL_COMMUNITY): Payer: Self-pay | Admitting: Internal Medicine

## 2018-06-28 ENCOUNTER — Other Ambulatory Visit (HOSPITAL_COMMUNITY): Payer: Self-pay | Admitting: Internal Medicine

## 2018-07-05 ENCOUNTER — Other Ambulatory Visit (HOSPITAL_COMMUNITY): Payer: Self-pay

## 2018-07-05 MED ORDER — SACUBITRIL-VALSARTAN 24-26 MG PO TABS: 1.0000 | ORAL_TABLET | Freq: Two times a day (BID) | ORAL | 3 refills | Status: DC

## 2018-07-12 ENCOUNTER — Ambulatory Visit: Payer: Medicare HMO | Admitting: Physical Medicine & Rehabilitation

## 2018-07-12 DIAGNOSIS — N189 Chronic kidney disease, unspecified: Secondary | ICD-10-CM | POA: Diagnosis not present

## 2018-07-12 DIAGNOSIS — D631 Anemia in chronic kidney disease: Secondary | ICD-10-CM | POA: Diagnosis not present

## 2018-07-12 DIAGNOSIS — I129 Hypertensive chronic kidney disease with stage 1 through stage 4 chronic kidney disease, or unspecified chronic kidney disease: Secondary | ICD-10-CM | POA: Diagnosis not present

## 2018-07-12 DIAGNOSIS — I509 Heart failure, unspecified: Secondary | ICD-10-CM | POA: Diagnosis not present

## 2018-07-12 DIAGNOSIS — I213 ST elevation (STEMI) myocardial infarction of unspecified site: Secondary | ICD-10-CM | POA: Diagnosis not present

## 2018-07-12 DIAGNOSIS — N183 Chronic kidney disease, stage 3 (moderate): Secondary | ICD-10-CM | POA: Diagnosis not present

## 2018-07-12 DIAGNOSIS — J449 Chronic obstructive pulmonary disease, unspecified: Secondary | ICD-10-CM | POA: Diagnosis not present

## 2018-07-13 ENCOUNTER — Ambulatory Visit: Payer: Medicare HMO | Admitting: Physical Medicine & Rehabilitation

## 2018-07-19 ENCOUNTER — Encounter: Payer: Medicare HMO | Attending: Physical Medicine & Rehabilitation | Admitting: Physical Medicine & Rehabilitation

## 2018-08-04 IMAGING — CT CT HEAD W/O CM
4 series · 17 of 47 positions shown, 19 images · non-contrast
Comparison: None.

CLINICAL DATA: Intermittent dizziness and blurry vision. History of
anoxic brain injury, hypertension, hyperglycemia.

EXAM:
CT HEAD WITHOUT CONTRAST
TECHNIQUE: Contiguous axial images were obtained from the base of the skull
through the vertex without intravenous contrast.

[Series 3: head without · axial · non-contrast · 0.44mm/px · z∈[-86,+44]mm · 7 of 36 slices shown, 9 images]
[im 5/36  brain]
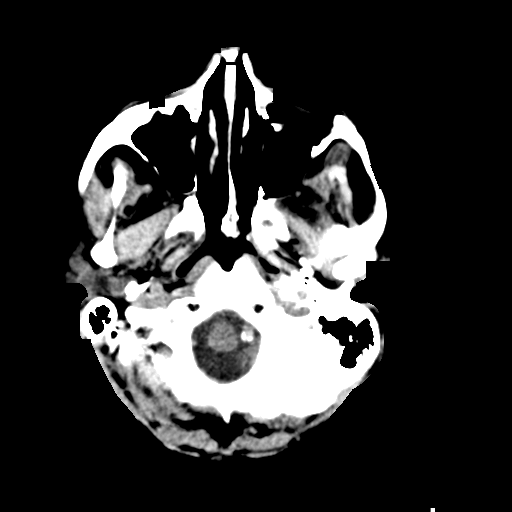
[im 5/36  bone]
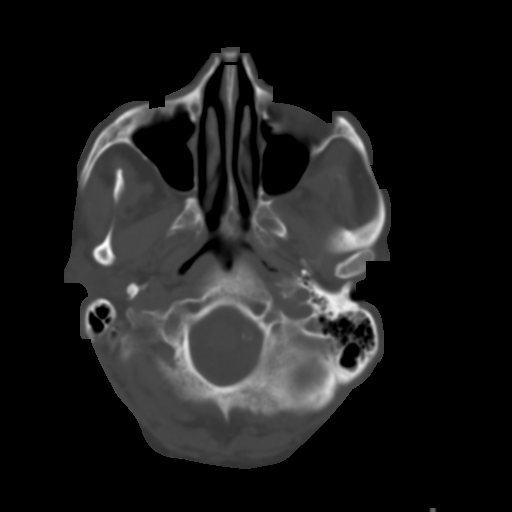
[im 9/36  brain]
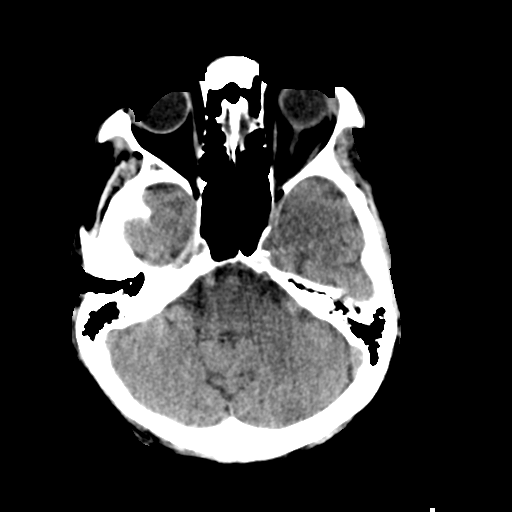
[im 14/36  brain]
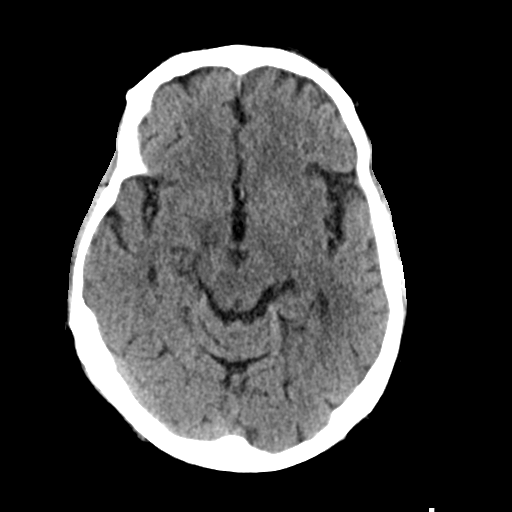
[im 18/36  brain]
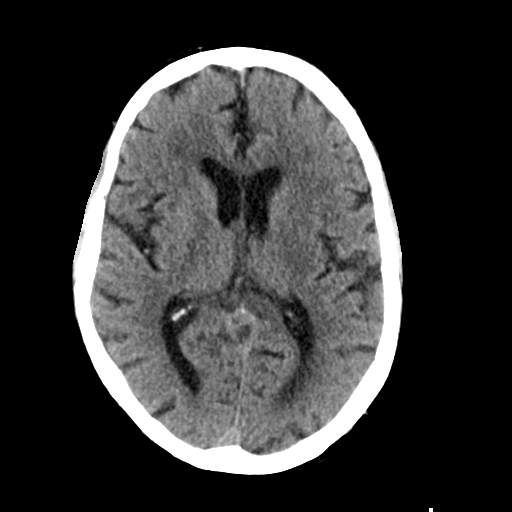
[im 22/36  brain]
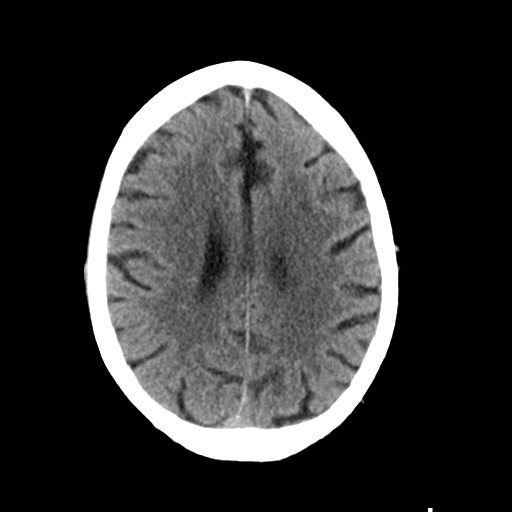
[im 22/36  bone]
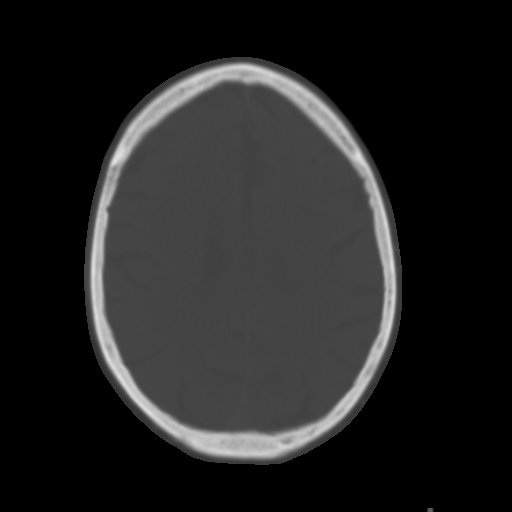
[im 27/36  brain]
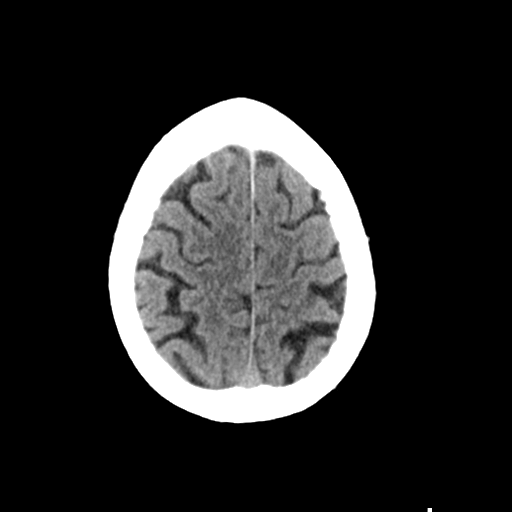
[im 31/36  brain]
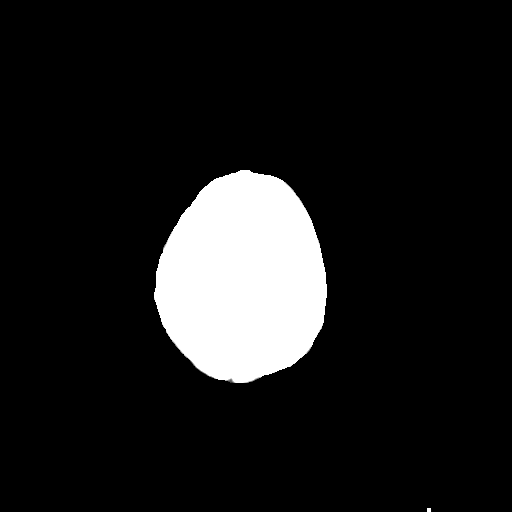

[Series 4: head bone · axial · 0.44mm/px · z∈[-90,-28]mm · 4 of 89 slices shown]
[im 9/89  bone]
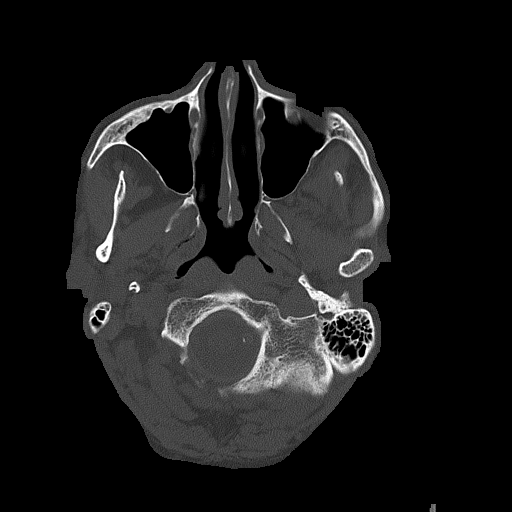
[im 18/89  bone]
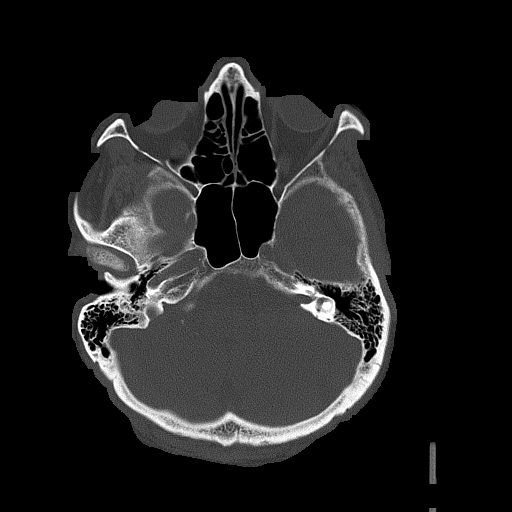
[im 27/89  bone]
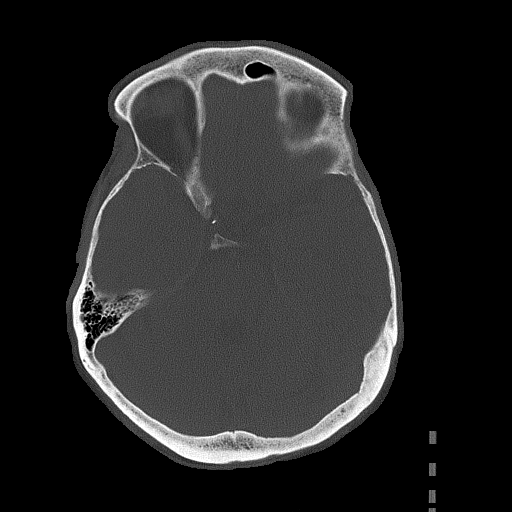
[im 40/89  bone]
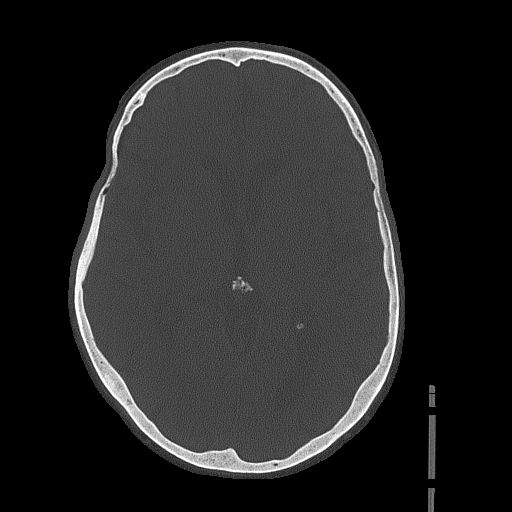

[Series 5: head without cor · coronal · non-contrast · 0.32mm/px · 3 of 67 slices shown]
[im 23/67  brain]
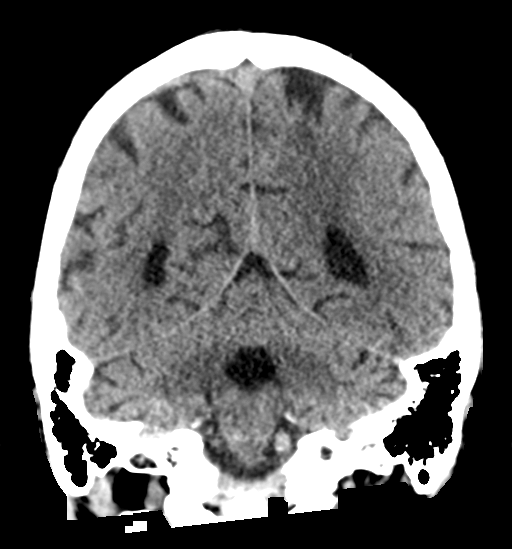
[im 30/67  brain]
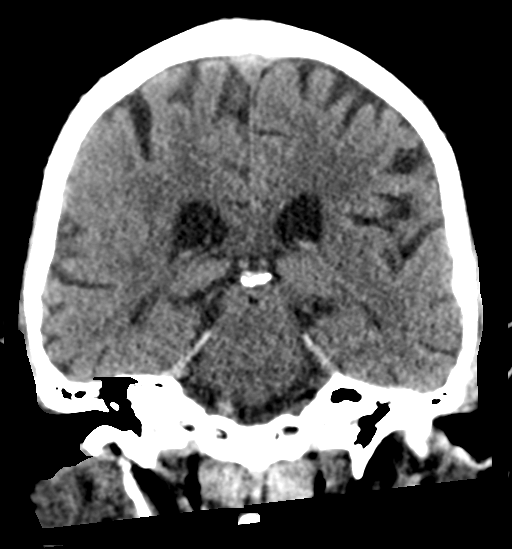
[im 37/67  brain]
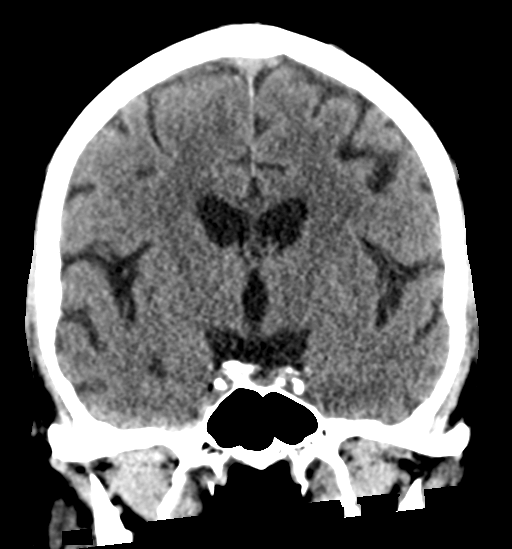

[Series 6: head without sag · sagittal · non-contrast · 0.34mm/px · 3 of 54 slices shown]
[im 18/54  brain]
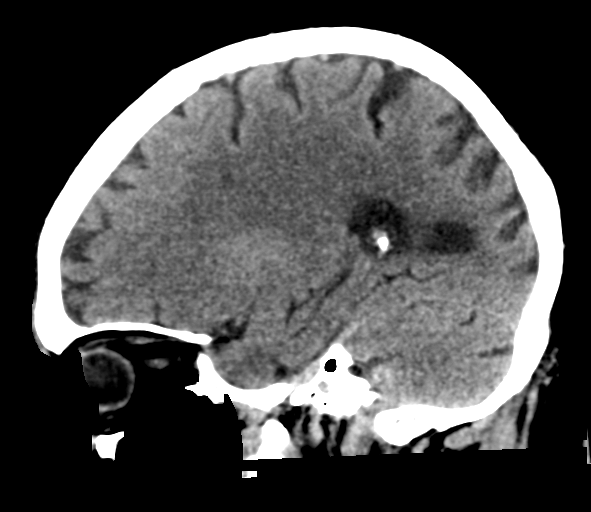
[im 27/54  brain]
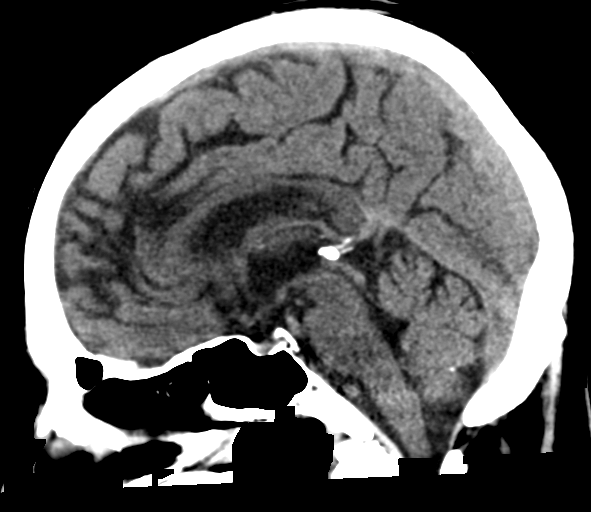
[im 36/54  brain]
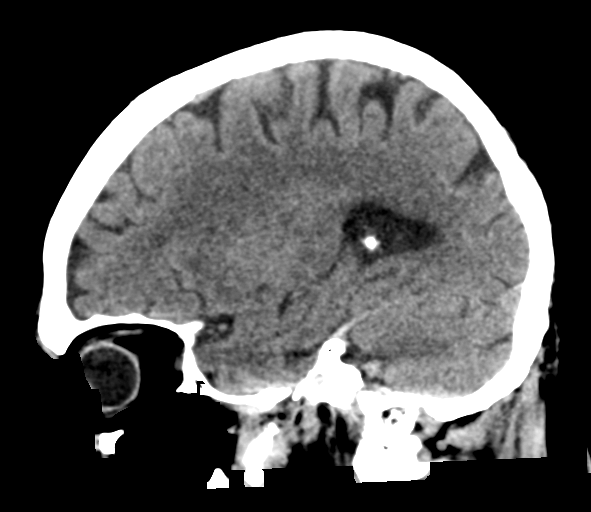

[17 of 47 positions shown; findings below may reference images not displayed]

FINDINGS: BRAIN: No intraparenchymal hemorrhage, mass effect nor midline
shift. Mild parenchymal brain volume loss. No hydrocephalus. Patchy
supratentorial white matter hypodensities less than expected for
patient's age, though non-specific are most compatible with chronic
small vessel ischemic disease. No acute large vascular territory
infarcts. No abnormal extra-axial fluid collections. Basal cisterns
are patent.

VASCULAR: Moderate calcific atherosclerosis of the carotid siphons.

SKULL: No skull fracture. No significant scalp soft tissue swelling.

SINUSES/ORBITS: Trace LEFT mastoid effusion. Subcentimeter RIGHT
maxillary mucosal retention cyst.The included ocular globes and
orbital contents are non-suspicious.

OTHER: None.
IMPRESSION: Negative noncontrast CT HEAD for age.

## 2018-08-06 DIAGNOSIS — H04123 Dry eye syndrome of bilateral lacrimal glands: Secondary | ICD-10-CM | POA: Diagnosis not present

## 2018-08-06 DIAGNOSIS — H40013 Open angle with borderline findings, low risk, bilateral: Secondary | ICD-10-CM | POA: Diagnosis not present

## 2018-08-06 DIAGNOSIS — H16223 Keratoconjunctivitis sicca, not specified as Sjogren's, bilateral: Secondary | ICD-10-CM | POA: Diagnosis not present

## 2018-08-09 ENCOUNTER — Encounter: Payer: Medicare HMO | Admitting: Physical Medicine & Rehabilitation

## 2018-08-20 ENCOUNTER — Other Ambulatory Visit (HOSPITAL_COMMUNITY): Payer: Self-pay | Admitting: Internal Medicine

## 2018-09-03 ENCOUNTER — Ambulatory Visit: Payer: Medicare HMO | Admitting: Physical Medicine & Rehabilitation

## 2018-09-19 ENCOUNTER — Other Ambulatory Visit (HOSPITAL_COMMUNITY): Payer: Self-pay | Admitting: Internal Medicine

## 2018-09-26 ENCOUNTER — Ambulatory Visit (INDEPENDENT_AMBULATORY_CARE_PROVIDER_SITE_OTHER): Payer: Medicare HMO | Admitting: Family Medicine

## 2018-09-26 ENCOUNTER — Encounter: Payer: Self-pay | Admitting: Family Medicine

## 2018-09-26 VITALS — BP 118/67 | HR 68 | Temp 98.6°F | Ht 67.0 in | Wt 215.0 lb

## 2018-09-26 DIAGNOSIS — R369 Urethral discharge, unspecified: Secondary | ICD-10-CM

## 2018-09-26 DIAGNOSIS — R319 Hematuria, unspecified: Secondary | ICD-10-CM | POA: Diagnosis not present

## 2018-09-26 HISTORY — DX: Urethral discharge, unspecified: R36.9

## 2018-09-26 LAB — POCT URINALYSIS DIPSTICK
Bilirubin, UA: NEGATIVE
Blood, UA: NEGATIVE
Glucose, UA: NEGATIVE
Ketones, UA: NEGATIVE
Leukocytes, UA: NEGATIVE
Nitrite, UA: NEGATIVE
Protein, UA: NEGATIVE
Spec Grav, UA: 1.015 (ref 1.010–1.025)
Urobilinogen, UA: 0.2 E.U./dL
pH, UA: 6 (ref 5.0–8.0)

## 2018-09-26 NOTE — Assessment & Plan Note (Signed)
-  New problem requiring further work up and evaluation.  -Check UA, BMP and PSA level.  -Denies any recent sexual activity -May need referral to urology

## 2018-09-26 NOTE — Patient Instructions (Signed)
We'll be in touch with lab results.  

## 2018-09-26 NOTE — Progress Notes (Signed)
Ronald HoveGary Vital - 68 y.o. male MRN [id number]  Date of birth: 1951/03/08  Subjective Chief Complaint  Patient presents with  . Follow-up    pt is c/o of brown discharge from his penis/underwear/ denied other symptoms/pt had surgery in 2018 and catheter--not sure if he is bleeding from the catheter?     HPI [name] is a 68 y.o. male with history of CAD with chronic systolic CHF, here today with complaint of penile discharge and hematuria.  He reports that he will often see a reddish/brown penile discharge with small amount of hematuria.  He hasn't really though much of it until his sister suggested he should get it checked out.  It has been going on for about 15 months since just after his hospitalization in 03/2017.  Reports having foley at that time and thinks it may be related to this.  He denies fever, chills, pain with urination, urinary frequency, back/pelvic pain, urinary hesitancy.  He is not sexually active.   ROS:  A comprehensive ROS was completed and negative except as noted per HPI  Allergies  Allergen Reactions  . Other Other (See Comments)    Potatoes- Blurry eyes, eyes hurt, and patient passes out (thinks he may bed sensitive)  . Tomato Other (See Comments)    Potatoes- Blurry eyes, eyes hurt, and patient passes out (thinks he may bed sensitive)    Past Medical History:  Diagnosis Date  . ACS (acute coronary syndrome) (HCC)   . Acute anterior wall MI (HCC)   . Acute blood loss anemia   . Acute heart failure (HCC)   . Acute hypoxemic respiratory failure (HCC)   . Acute pulmonary edema (HCC)   . Acute respiratory failure with hypoxia (HCC)   . Aneurysm of left ventricle of heart 04/12/2017  . Anoxic brain injury (HCC)   . Bacteremia   . Benign essential HTN   . CAD in native artery   . Cardiogenic shock (HCC)   . CHF (congestive heart failure), NYHA class II, acute, combined (HCC) 04/11/2017  . Chronic neck pain   . Chronic systolic (congestive) heart failure  (HCC)   . Confusion   . Congestive heart failure (CHF) (HCC)   . COPD (chronic obstructive pulmonary disease) (HCC) 04/11/2017  . Coronary artery disease   . Dysphagia   . Encounter for central line placement   . Encounter for management of intra-aortic balloon pump   . Essential hypertension   . Fever   . Hyperglycemia   . Hypertension   . Hypokalemia   . Hypotension due to drugs   . Hypoxemia   . Labile blood pressure   . Leukocytosis   . Physical debility 04/27/2017  . Rash 05/15/2017  . Sepsis (HCC)   . Sore throat   . STEMI (ST elevation myocardial infarction) (HCC) 04/11/2017  . Supplemental oxygen dependent   . Tachypnea   . 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 APPLICATION OF WOUND VAC N/A 04/14/2017   Procedure: APPLICATION OF WOUND VAC with reposition of Impella device;  Surgeon: Delight OvensGerhardt, Edward B, MD;  Location: [facility];  Service: Thoracic;  Laterality: N/A;  . CARDIAC CATHETERIZATION    . CORONARY STENT INTERVENTION N/A 04/11/2017   Procedure: CORONARY STENT INTERVENTION;  Surgeon: Corky CraftsVaranasi, Jayadeep S, MD;  [facility];  Service: Cardiovascular;  Laterality: N/A;  . CORONARY THROMBECTOMY N/A 04/11/2017   Procedure:  Coronary Thrombectomy;  Surgeon: Corky Crafts, MD;  Location[facility] INVAS[facility] CV LAB;  Service: Cardiovascular;  Laterality: N/A;  . CORONARY/GRAFT ACUTE MI REVASCULARIZATION N/A 04/11/2017   Procedure: Coronary/Graft Acute MI Revascularization;  Surgeon: Corky Crafts, MD;  Location: [facility] INVASIVE CV LAB;  Service: Cardiovascular;  Laterality: N/A;  . IABP INSERTION N/A 04/11/2017   Procedure: IABP Insertion;  Surgeon: Corky Crafts, MD;  [facility] CV LAB;  Service: Cardiovascular;  Laterality: N/A;  . LEFT HEART CATH AND CORONARY ANGIOGRAPHY N/A 04/11/2017   Procedure: LEFT HEART CATH AND CORONARY ANGIOGRAPHY;   Surgeon: Corky Crafts, MD;  Location: [facility] CV LAB;  Service: Cardiovascular;  Laterality: N/A;  . PLACEMENT OF IMPELLA LEFT VENTRICULAR ASSIST DEVICE  04/12/2017   Procedure: PLACEMENT OF IMPELLA  LD;  Surgeon: Delight Ovens, MD;  Location: [facility] OR;  Service: Open Heart Surgery;;  . REMOVAL OF IMPELLA LEFT VENTRICULAR ASSIST DEVICE N/A 04/17/2017   Procedure: REMOVAL OF IMPELLA LEFT VENTRICULAR ASSIST DEVICE;  Surgeon: Delight Ovens, MD;  Location: [facility] OR;  Service: Open Heart Surgery;  Laterality: N/A;  . REPAIR OF RIGHT VENTRICLE LACERATION Left 04/12/2017   Procedure: Resection of LV Aneurysm;  Surgeon: Delight Ovens, MD;  Location: [facility] OR;  Service: Open Heart Surgery;  Laterality: Left;  . STERNAL CLOSURE N/A 04/14/2017   Procedure: STERNAL CLOSURE;  Surgeon: Delight Ovens, MD;  Location: [facility] OR;  Service: Thoracic;  Laterality: N/A;  . TEE WITHOUT CARDIOVERSION N/A 04/14/2017   Procedure: TRANSESOPHAGEAL ECHOCARDIOGRAM (TEE);  Surgeon: Delight Ovens, MD;  Location: [facility] OR;  Service: Thoracic;  Laterality: N/A;  . ULTRASOUND GUIDANCE FOR VASCULAR ACCESS  04/11/2017   Procedure: Ultrasound Guidance For Vascular Access;  Surgeon: Corky Crafts, MD;  Location: [facility] INVASIVE CV LAB;  Service: Cardiovascular;;    Social History   Socioeconomic History  . Marital status: Divorced    Spouse name: Not on file  . Number of children: Not on file  . Years of education: Not on file  . Highest education level: Not on file  Occupational History  . Not on file  Social Needs  . Financial resource strain: Not on file  . Food insecurity:    Worry: Not on file    Inability: Not on file  . Transportation needs:    Medical: Not on file    Non-medical: Not on file  Tobacco Use  . Smoking status: Former Smoker    Packs/day: 1.50    Years: 53.00    Pack years: 79.50    Types: Cigarettes    Last attempt to quit: 04/11/2017    Years since quitting: 1.4  .  Smokeless tobacco: Never Used  Substance and Sexual Activity  . Alcohol use: Never    Frequency: Never  . Drug use: Never  . Sexual activity: Not on file  Lifestyle  . Physical activity:    Days per week: 0 days    Minutes per session: 0 min  . Stress: To some extent  Relationships  . Social connections:    Talks on phone: Not on file    Gets together: Not on file    Attends religious service: Not on file    Active member of club or organization: Not on file    Attends meetings of clubs or organizations: Not on file    Relationship status: Not on file  Other Topics Concern  . Not on file  Social History Narrative  .  Not on file    Family History  Problem Relation Age of Onset  . Esophageal cancer Mother     Health Maintenance  Topic Date Due  . Janet Berlin  08/24/1967  . COLONOSCOPY  08/24/1998  . PNA vac Low Risk Adult (1 of 2 - PCV13) 08/23/2013  . INFLUENZA VACCINE  12/29/2018  . Hepatitis C Screening  Completed    ----------------------------------------------------------------------------------------------------------------------------------------------------------------------------------------------------------------- Physical Exam BP 118/67 Comment: pt report  Pulse 68 Comment: pt report  Temp 98.6 F (37 C) (Oral) Comment: pt report  Ht  (1.702 m)   Wt 215 lb (97.5 kg)   BMI 33.67 kg/m   Physical Exam Constitutional:      Appearance: Normal appearance.  HENT:     Head: Normocephalic and atraumatic.     Mouth/Throat:     Mouth: Mucous membranes are moist.  Eyes:     General: No scleral icterus. Neck:     Musculoskeletal: Neck supple.  Cardiovascular:     Rate and Rhythm: Normal rate and regular rhythm.     Pulses: Normal pulses.  Pulmonary:     Effort: Pulmonary effort is normal.     Breath sounds: Normal breath sounds.  Abdominal:     Tenderness: There is no right CVA tenderness or left CVA tenderness.  Genitourinary:    Comments:  Declines DRE.  Neurological:     General: No focal deficit present.     Mental Status: He is alert.  Psychiatric:        Mood and Affect: Mood normal.        Behavior: Behavior normal.     ------------------------------------------------------------------------------------------------------------------------------------------------------------------------------------------------------------------- Assessment and Plan  Penile discharge -New problem requiring further work up and evaluation.  -Check UA, BMP and PSA level.  -Denies any recent sexual activity -May need referral to urology

## 2018-09-27 LAB — PSA: PSA: 0.69 ng/mL (ref 0.10–4.00)

## 2018-09-27 LAB — BASIC METABOLIC PANEL
BUN: 17 mg/dL (ref 6–23)
CO2: 26 mEq/L (ref 19–32)
Calcium: 9.1 mg/dL (ref 8.4–10.5)
Chloride: 103 mEq/L (ref 96–112)
Creatinine, Ser: 1.42 mg/dL (ref 0.40–1.50)
GFR: 49.27 mL/min — ABNORMAL LOW (ref >60.00)
Glucose, Bld: 92 mg/dL (ref 70–99)
Potassium: 4.4 mEq/L (ref 3.5–5.1)
Sodium: 139 mEq/L (ref 135–145)

## 2018-10-04 NOTE — Progress Notes (Signed)
Lab results are normal.  We can refer him to urology for further evaluation if this persists.

## 2018-10-20 ENCOUNTER — Other Ambulatory Visit (HOSPITAL_COMMUNITY): Payer: Self-pay | Admitting: Internal Medicine

## 2018-10-21 ENCOUNTER — Other Ambulatory Visit: Payer: Self-pay | Admitting: Nurse Practitioner

## 2018-10-21 ENCOUNTER — Telehealth: Payer: Self-pay | Admitting: Surgery

## 2018-10-21 MED ORDER — TICAGRELOR 90 MG PO TABS: 90.0000 mg | ORAL_TABLET | Freq: Two times a day (BID) | ORAL | 6 refills | Status: DC

## 2018-10-21 NOTE — Telephone Encounter (Signed)
ED CM received call from patient he reports he ran out of his Brilinta and has not taken it in 3 days. He states his pharmacy has been trying to contact the HF clinic Provider to obtain the refill.  CM attempted to contact the on-call HF MD awaiting a return call. CM advised patient to contact the pharmacy concerning assisting him purchasing with a couple of doses until obtaining a refill prescription, patient is agreeable. Encouraged patient to call Tuesday morning 5/26 to schedule a follow-up patient verbalized understanding teach back done. CM routed a message to HF Care Coordinator Driscilla Moats RN as well.

## 2018-10-24 ENCOUNTER — Telehealth (HOSPITAL_COMMUNITY): Payer: Self-pay | Admitting: Surgery

## 2018-10-24 NOTE — Telephone Encounter (Signed)
I called Mr. Boardwine after receiving a message from Michel Bickers regarding the fact that he was out of Brilinta.  He tells me that he now has medication after it was called in.  He does request an appt with Dr. Gala Romney and says it has been a long time since his last appt.  I have sent a message to front office

## 2018-11-01 ENCOUNTER — Other Ambulatory Visit (HOSPITAL_COMMUNITY): Payer: Self-pay | Admitting: Internal Medicine

## 2018-11-05 ENCOUNTER — Encounter: Payer: Medicare HMO | Admitting: Physical Medicine & Rehabilitation

## 2019-01-30 ENCOUNTER — Inpatient Hospital Stay: Admission: RE | Admit: 2019-01-30 | Payer: Medicare HMO | Source: Ambulatory Visit

## 2019-03-05 NOTE — Progress Notes (Signed)
Subjective:   [name] is a 68 y.o. male who presents for an Initial Medicare Annual Wellness Visit.  Review of Systems  Home Safety/Smoke Alarms: Feels safe in home. Smoke alarms in place.  Lives w/ girlfriend. 1 story home. Step over tub.  Male:   CCS- declines     PSA-  Lab Results  Component Value Date   PSA 0.69 09/26/2018      Objective:    Today's Vitals   03/06/19 1341  BP: 118/78  Pulse: (!) 55  Temp: 97.8 F (36.6 C)  TempSrc: Temporal  SpO2: 95%  Weight: 224 lb (101.6 kg)  Height: 5' 7.5" (1.715 m)   Body mass index is 34.57 kg/m.  Advanced Directives 03/06/2019 11/20/2017 09/27/2017 04/27/2017 04/18/2017  Does Patient Have a Medical Advance Directive? No No No No No  Would patient like information on creating a medical advance directive? No - Patient declined Yes (MAU/Ambulatory/Procedural Areas - Information given) No - Patient declined No - Patient declined No - Patient declined    Current Medications (verified) Outpatient Encounter Medications as of 03/06/2019  Medication Sig  . acetaminophen (TYLENOL) 325 MG tablet Take 1-2 tablets (325-650 mg total) by mouth every 4 (four) hours as needed for mild pain.  Marland Kitchen aspirin 81 MG chewable tablet Place 1 tablet (81 mg total) into feeding tube daily. (Patient taking differently: Chew 81 mg by mouth daily. )  . atorvastatin (LIPITOR) 80 MG tablet TAKE 1 TABLET (80 MG TOTAL) BY MOUTH DAILY AT 6 PM.  . ENTRESTO 24-26 MG TAKE 1 TABLET BY MOUTH TWICE A DAY  . ticagrelor (BRILINTA) 90 MG TABS tablet Take 1 tablet (90 mg total) by mouth 2 (two) times daily.  . carvedilol (COREG) 3.125 MG tablet TAKE 1 TABLET (3.125 MG TOTAL) BY MOUTH 2 (TWO) TIMES DAILY.  . [DISCONTINUED] ENTRESTO 24-26 MG TAKE 1 TABLET BY MOUTH TWICE A DAY  . [DISCONTINUED] triamcinolone cream (KENALOG) 0.5 % Apply topically 4 (four) times daily. Apply thin smear to rash (Patient not taking: Reported on 03/06/2019)   No facility-administered  encounter medications on file as of 03/06/2019.     Allergies (verified) Other and Tomato   History: Past Medical History:  Diagnosis Date  . ACS (acute coronary syndrome) (HCC)   . Acute anterior wall MI (HCC)   . Acute blood loss anemia   . Acute heart failure (HCC)   . Acute hypoxemic respiratory failure (HCC)   . Acute pulmonary edema (HCC)   . Acute respiratory failure with hypoxia (HCC)   . Aneurysm of left ventricle of heart 04/12/2017  . Anoxic brain injury (HCC)   . Bacteremia   . Benign essential HTN   . CAD in native artery   . Cardiogenic shock (HCC)   . CHF (congestive heart failure), NYHA class II, acute, combined (HCC) 04/11/2017  . Chronic neck pain   . Chronic systolic (congestive) heart failure (HCC)   . Confusion   . Congestive heart failure (CHF) (HCC)   . COPD (chronic obstructive pulmonary disease) (HCC) 04/11/2017  . Coronary artery disease   . Dysphagia   . Encounter for central line placement   . Encounter for management of intra-aortic balloon pump   . Essential hypertension   . Fever   . Hyperglycemia   . Hypertension   . Hypokalemia   . Hypotension due to drugs   . Hypoxemia   . Labile blood pressure   . Leukocytosis   . Physical debility 04/27/2017  .  Rash 05/15/2017  . Sepsis ([place])   . Sore throat   . STEMI (ST elevation myocardial infarction) ([place]) 04/11/2017  . Supplemental oxygen dependent   . Tachypnea   . 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 [facility])    Past Surgical History:  Procedure Laterality Date  . APPLICATION OF WOUND VAC N/A 04/14/2017   Procedure: APPLICATION OF WOUND VAC with reposition of Impella device;  Surgeon: Grace Isaac, MD;  Location: [place];  Service: Thoracic;  Laterality: N/A;  . CARDIAC CATHETERIZATION    . CORONARY STENT INTERVENTION N/A 04/11/2017   Procedure: CORONARY STENT INTERVENTION;  Surgeon: Jettie Booze, MD;  Location: [place] CV LAB;  Service:  Cardiovascular;  Laterality: N/A;  . CORONARY THROMBECTOMY N/A 04/11/2017   Procedure: Coronary Thrombectomy;  Surgeon: Jettie Booze, MD;  Location: [place] [facility];  Service: Cardiovascular;  Laterality: N/A;  . CORONARY/GRAFT ACUTE MI REVASCULARIZATION N/A 04/11/2017   Procedure: Coronary/Graft Acute MI Revascularization;  Surgeon: Jettie Booze, MD;  Location: [place] [facility];  Service: Cardiovascular;  Laterality: N/A;  . IABP INSERTION N/A 04/11/2017   Procedure: IABP Insertion;  Surgeon: Jettie Booze, MD;  Location: [place] [facility];  Service: Cardiovascular;  Laterality: N/A;  . LEFT HEART CATH AND CORONARY ANGIOGRAPHY N/A 04/11/2017   Procedure: LEFT HEART CATH AND CORONARY ANGIOGRAPHY;  Surgeon: Jettie Booze, MD;  Location: [place] [facility];  Service: Cardiovascular;  Laterality: N/A;  . PLACEMENT OF IMPELLA LEFT VENTRICULAR ASSIST DEVICE  04/12/2017   Procedure: PLACEMENT OF IMPELLA  LD;  Surgeon: Grace Isaac, MD;  Location: [place];  Service: Open Heart Surgery;;  . REMOVAL OF IMPELLA LEFT VENTRICULAR ASSIST DEVICE N/A 04/17/2017   Procedure: REMOVAL OF IMPELLA LEFT VENTRICULAR ASSIST DEVICE;  Surgeon: Grace Isaac, MD;  Location: [place];  Service: Open Heart Surgery;  Laterality: N/A;  . REPAIR OF RIGHT VENTRICLE LACERATION Left 04/12/2017   Procedure: Resection of LV Aneurysm;  Surgeon: Grace Isaac, MD;  Location: [place];  Service: Open Heart Surgery;  Laterality: Left;  . STERNAL CLOSURE N/A 04/14/2017   Procedure: STERNAL CLOSURE;  Surgeon: Grace Isaac, MD;  Location: [place];  Service: Thoracic;  Laterality: N/A;  . TEE WITHOUT CARDIOVERSION N/A 04/14/2017   Procedure: TRANSESOPHAGEAL ECHOCARDIOGRAM (TEE);  Surgeon: Grace Isaac, MD;  Location: [facility];  Service: Thoracic;  Laterality: N/A;  . ULTRASOUND GUIDANCE FOR VASCULAR ACCESS  04/11/2017   Procedure: Ultrasound Guidance For Vascular Access;  Surgeon:  Jettie Booze, MD;  Location: [place] CV LAB;  Service: Cardiovascular;;   Family History  Problem Relation Age of Onset  . Esophageal cancer Mother    Social History   Socioeconomic History  . Marital status: Divorced    Spouse name: Not on file  . Number of children: Not on file  . Years of education: Not on file  . Highest education level: Not on file  Occupational History  . Not on file  Social Needs  . Financial resource strain: Not on file  . Food insecurity    Worry: Not on file    Inability: Not on file  . Transportation needs    Medical: Not on file    Non-medical: Not on file  Tobacco Use  . Smoking status: Former Smoker    Packs/day: 1.50    Years: 53.00    Pack years: 79.50    Types: Cigarettes    Quit  date: 04/11/2017    Years since quitting: 1.9  . Smokeless tobacco: Never Used  Substance and Sexual Activity  . Alcohol use: Never    Frequency: Never  . Drug use: Never  . Sexual activity: Not on file  Lifestyle  . Physical activity    Days per week: 0 days    Minutes per session: 0 min  . Stress: To some extent  Relationships  . Social Musician on phone: Not on file    Gets together: Not on file    Attends religious service: Not on file    Active member of club or organization: Not on file    Attends meetings of clubs or organizations: Not on file    Relationship status: Not on file  Other Topics Concern  . Not on file  Social History Narrative  . Not on file   Tobacco Counseling Counseling given: Not Answered   Clinical Intake: Pain : No/denies pain   Activities of Daily Living In your present state of health, do you have any difficulty performing the following activities: 03/06/2019 03/06/2019  Hearing? N N  Vision? N N  Difficulty concentrating or making decisions? N N  Walking or climbing stairs? N N  Dressing or bathing? N N  Doing errands, shopping? N N  Preparing Food and eating ? N -  Using the Toilet?  N -  In the past six months, have you accidently leaked urine? N -  Do you have problems with loss of bowel control? N -  Managing your Medications? N -  Managing your Finances? N -  Housekeeping or managing your Housekeeping? N -  Some recent data might be hidden     Immunizations and Health Maintenance  There is no immunization history on file for this patient. There are no preventive care reminders to display for this patient.  Patient Care Team: Everrett Coombe, DO as PCP - General (Family Medicine) Corky Crafts, MD as PCP - Cardiology (Cardiology)  Indicate any recent Medical Services you may have received from other than [facility] providers in the past year (date may be approximate).    Assessment:   This is a routine wellness examination for [place]. Physical assessment deferred to PCP.  Hearing/Vision screen  Hearing Screening   125Hz  250Hz  500Hz  1000Hz  2000Hz  3000Hz  4000Hz  6000Hz  8000Hz   Right ear:           Left ear:           Comments: Passed whisper test    Visual Acuity Screening   Right eye Left eye Both eyes  Without correction: 20/20 20/20 20/20   With correction:       Dietary issues and exercise activities discussed: Current Exercise Habits: The patient does not participate in regular exercise at present, Exercise limited by: None identified Diet (meal preparation, eat out, water intake, caffeinated beverages, dairy products, fruits and vegetables): well balanced   Goals    . DIET - INCREASE WATER INTAKE      Depression Screen PHQ 2/9 Scores 03/06/2019 03/06/2019 11/20/2017 10/30/2017  PHQ - 2 Score 0 0 1 0  PHQ- 9 Score - - - -    Fall Risk Fall Risk  03/06/2019 03/06/2019 11/20/2017 10/30/2017 09/21/2017  Falls in the past year? 0 0 No No No  Number falls in past yr: 0 0 - - -  Injury with Fall? 0 0 - - -    Cognitive Function: Ad8 score reviewed for issues:  Issues making decisions:no  Less interest in hobbies / activities:no  Repeats questions,  stories (family complaining):no  Trouble using ordinary gadgets (microwave, computer, phone):no  Forgets the month or year: no  Mismanaging finances: no  Remembering appts:no  Daily problems with thinking and/or memory:no Ad8 score is=0         Screening Tests Health Maintenance  Topic Date Due  . COLONOSCOPY  03/05/2020 (Originally 08/24/1998)  . TETANUS/TDAP  03/05/2020 (Originally 08/24/1967)  . PNA vac Low Risk Adult (1 of 2 - PCV13) 03/05/2020 (Originally 08/23/2013)  . Hepatitis C Screening  Completed  . INFLUENZA VACCINE  Discontinued        Plan:    Please schedule your next medicare wellness visit with me in 1 yr.   Continue to eat heart healthy diet (full of fruits, vegetables, whole grains, lean protein, water--limit salt, fat, and sugar intake) and increase physical activity as tolerated.  Continue doing brain stimulating activities (puzzles, reading, adult coloring books, staying active) to keep memory sharp.     I have personally reviewed and noted the following in the patient's chart:   . Medical and social history . Use of alcohol, tobacco or illicit drugs  . Current medications and supplements . Functional ability and status . Nutritional status . Physical activity . Advanced directives . List of other physicians . Hospitalizations, surgeries, and ER visits in previous 12 months . Vitals . Screenings to include cognitive, depression, and falls . Referrals and appointments  In addition, I have reviewed and discussed with patient certain preventive protocols, quality metrics, and best practice recommendations. A written personalized care plan for preventive services as well as general preventive health recommendations were provided to patient.     Avon GullyBritt, Miranda Frese Angel, [place]RN   03/06/2019

## 2019-03-06 ENCOUNTER — Other Ambulatory Visit: Payer: Self-pay

## 2019-03-06 ENCOUNTER — Encounter: Payer: Self-pay | Admitting: Family Medicine

## 2019-03-06 ENCOUNTER — Ambulatory Visit (INDEPENDENT_AMBULATORY_CARE_PROVIDER_SITE_OTHER): Payer: Medicare HMO | Admitting: Family Medicine

## 2019-03-06 ENCOUNTER — Encounter: Payer: Self-pay | Admitting: *Deleted

## 2019-03-06 ENCOUNTER — Other Ambulatory Visit (HOSPITAL_COMMUNITY): Payer: Self-pay | Admitting: Internal Medicine

## 2019-03-06 ENCOUNTER — Ambulatory Visit (INDEPENDENT_AMBULATORY_CARE_PROVIDER_SITE_OTHER): Payer: Medicare HMO | Admitting: *Deleted

## 2019-03-06 VITALS — BP 118/78 | HR 55 | Temp 97.8°F | Ht 67.5 in | Wt 224.0 lb

## 2019-03-06 VITALS — BP 118/78 | HR 52 | Resp 16 | Ht 67.5 in | Wt 224.6 lb

## 2019-03-06 DIAGNOSIS — Z0000 Encounter for general adult medical examination without abnormal findings: Secondary | ICD-10-CM

## 2019-03-06 DIAGNOSIS — R7989 Other specified abnormal findings of blood chemistry: Secondary | ICD-10-CM

## 2019-03-06 DIAGNOSIS — I2510 Atherosclerotic heart disease of native coronary artery without angina pectoris: Secondary | ICD-10-CM

## 2019-03-06 HISTORY — DX: Encounter for general adult medical examination without abnormal findings: Z00.00

## 2019-03-06 LAB — COMPREHENSIVE METABOLIC PANEL
ALT: 12 U/L (ref 0–53)
AST: 13 U/L (ref 0–37)
Albumin: 3.9 g/dL (ref 3.5–5.2)
Alkaline Phosphatase: 89 U/L (ref 39–117)
BUN: 23 mg/dL (ref 6–23)
CO2: 25 mEq/L (ref 19–32)
Calcium: 9.2 mg/dL (ref 8.4–10.5)
Chloride: 106 mEq/L (ref 96–112)
Creatinine, Ser: 1.35 mg/dL (ref 0.40–1.50)
GFR: 52.17 mL/min — ABNORMAL LOW (ref >60.00)
Glucose, Bld: 102 mg/dL — ABNORMAL HIGH (ref 70–99)
Potassium: 4.9 mEq/L (ref 3.5–5.1)
Sodium: 139 mEq/L (ref 135–145)
Total Bilirubin: 0.4 mg/dL (ref 0.2–1.2)
Total Protein: 7 g/dL (ref 6.0–8.3)

## 2019-03-06 LAB — CBC WITH DIFFERENTIAL/PLATELET
Basophils Absolute: 0.1 10*3/uL (ref 0.0–0.1)
Basophils Relative: 0.6 % (ref 0.0–3.0)
Eosinophils Absolute: 0.2 10*3/uL (ref 0.0–0.7)
Eosinophils Relative: 1.8 % (ref 0.0–5.0)
HCT: 37.5 % — ABNORMAL LOW (ref 39.0–52.0)
Hemoglobin: 12.3 g/dL — ABNORMAL LOW (ref 13.0–17.0)
Lymphocytes Relative: 22.3 % (ref 12.0–46.0)
Lymphs Abs: 2 10*3/uL (ref 0.7–4.0)
MCHC: 32.7 g/dL (ref 30.0–36.0)
MCV: 84.5 fl (ref 78.0–100.0)
Monocytes Absolute: 0.8 10*3/uL (ref 0.1–1.0)
Monocytes Relative: 9.3 % (ref 3.0–12.0)
Neutro Abs: 5.9 10*3/uL (ref 1.4–7.7)
Neutrophils Relative %: 66 % (ref 43.0–77.0)
Platelets: 285 10*3/uL (ref 150.0–400.0)
RBC: 4.44 Mil/uL (ref 4.22–5.81)
RDW: 16.2 % — ABNORMAL HIGH (ref 11.5–15.5)
WBC: 9 10*3/uL (ref 4.0–10.5)

## 2019-03-06 LAB — LIPID PANEL
Cholesterol: 107 mg/dL (ref 0–200)
HDL: 28 mg/dL — ABNORMAL LOW (ref >39.00)
LDL Cholesterol: 62 mg/dL (ref 0–99)
NonHDL: 78.7
Total CHOL/HDL Ratio: 4
Triglycerides: 82 mg/dL (ref 0.0–149.0)
VLDL: 16.4 mg/dL (ref 0.0–40.0)

## 2019-03-06 LAB — TSH: TSH: 0.81 u[IU]/mL (ref 0.35–4.50)

## 2019-03-06 NOTE — Progress Notes (Signed)
[name] - 68 y.o. male MRN [id number]  Date of birth: [birth year]  Subjective Chief Complaint  Patient presents with  . Annual Exam    rt hand and arm and teeth. lump on chest he wants looked at too    HPI [name] is a 67 y.o. male with history of CAD with CHF, HTN, and TBI here today for annual exam.  He has a nodule on his chest that he thinks could be an RFID tracking device that he would like checked out.  He also has concerns about his teeth.  He has a dentist who was planning on extracting all of his teeth but he put it off because he wasn't feeling well.  He plans to reschedule this.  He denies acute pain related to this.  He has never had colon cancer screening and declines to have this completed.  He also declines influenza and pneumonia vaccine because he "doesn't know what they're putting in them".  He had lung cancer screening last year with recommendation to repeat in 1 year, he declines this.   He does not exercise regularly and does not have any dietary restrictions.  He continues to follow with his cardiologist.    Review of Systems  Constitutional: Negative for chills, fever, malaise/fatigue and weight loss.  HENT: Negative for congestion, ear pain and sore throat.   Eyes: Negative for blurred vision, double vision and pain.  Respiratory: Negative for cough and shortness of breath.   Cardiovascular: Negative for chest pain and palpitations.  Gastrointestinal: Negative for abdominal pain, blood in stool, constipation, heartburn and nausea.  Genitourinary: Negative for dysuria and urgency.  Musculoskeletal: Negative for joint pain and myalgias.  Neurological: Negative for dizziness and headaches.  Endo/Heme/Allergies: Does not bruise/bleed easily.  Psychiatric/Behavioral: Negative for depression. The patient is not nervous/anxious and does not have insomnia.     Allergies  Allergen Reactions  . Other Other (See Comments)    Potatoes- Blurry eyes, eyes hurt, and  patient passes out (thinks he may bed sensitive)  . Tomato Other (See Comments)    Potatoes- Blurry eyes, eyes hurt, and patient passes out (thinks he may bed sensitive)    Past Medical History:  Diagnosis Date  . ACS (acute coronary syndrome) ([place])   . Acute anterior wall MI ([place])   . Acute blood loss anemia   . Acute heart failure ([place])   . Acute hypoxemic respiratory failure ([place])   . Acute pulmonary edema (HCC)   . Acute respiratory failure with hypoxia ([place])   . Aneurysm of left ventricle of heart 04/12/2017  . Anoxic brain injury ([place])   . Bacteremia   . Benign essential HTN   . CAD in native artery   . Cardiogenic shock ([place])   . CHF (congestive heart failure), NYHA class II, acute, combined ([place]) 04/11/2017  . Chronic neck pain   . Chronic systolic (congestive) heart failure ([place])   . Confusion   . Congestive heart failure (CHF) ([place])   . COPD (chronic obstructive pulmonary disease) ([place]) 04/11/2017  . Coronary artery disease   . Dysphagia   . Encounter for central line placement   . Encounter for management of intra-aortic balloon pump   . Essential hypertension   . Fever   . Hyperglycemia   . Hypertension   . Hypokalemia   . Hypotension due to drugs   . Hypoxemia   . Labile blood pressure   . Leukocytosis   . Physical debility 04/27/2017  .  Rash 05/15/2017  . Sepsis ([place])   . Sore throat   . STEMI (ST elevation myocardial infarction) ([place]) 04/11/2017  . Supplemental oxygen dependent   . Tachypnea   . 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 APPLICATION OF WOUND VAC N/A 04/14/2017   Procedure: APPLICATION OF WOUND VAC with reposition of Impella device;  Surgeon: Grace Isaac, MD;  Location: [place];  Service: Thoracic;  Laterality: N/A;  . CARDIAC CATHETERIZATION    . CORONARY STENT INTERVENTION N/A 04/11/2017   Procedure: CORONARY STENT INTERVENTION;  Surgeon:  Jettie Booze, MD;  Location: [place] [facility] LAB;  Service: Cardiovascular;  Laterality: N/A;  . CORONARY THROMBECTOMY N/A 04/11/2017   Procedure: Coronary Thrombectomy;  Surgeon: Jettie Booze, MD;  Location: [place] CV LAB;  Service: Cardiovascular;  Laterality: N/A;  . CORONARY/GRAFT ACUTE MI REVASCULARIZATION N/A 04/11/2017   Procedure: Coronary/Graft Acute MI Revascularization;  Surgeon: Jettie Booze, MD;  Location: [place] [facility];  Service: Cardiovascular;  Laterality: N/A;  . IABP INSERTION N/A 04/11/2017   Procedure: IABP Insertion;  Surgeon: Jettie Booze, MD;  Location: [place] CV LAB;  Service: Cardiovascular;  Laterality: N/A;  . LEFT HEART CATH AND CORONARY ANGIOGRAPHY N/A 04/11/2017   Procedure: LEFT HEART CATH AND CORONARY ANGIOGRAPHY;  Surgeon: Jettie Booze, MD;  Location: [place] [facility];  Service: Cardiovascular;  Laterality: N/A;  . PLACEMENT OF IMPELLA LEFT VENTRICULAR ASSIST DEVICE  04/12/2017   Procedure: PLACEMENT OF IMPELLA  LD;  Surgeon: Grace Isaac, MD;  Location: [place];  Service: Open Heart Surgery;;  . REMOVAL OF IMPELLA LEFT VENTRICULAR ASSIST DEVICE N/A 04/17/2017   Procedure: REMOVAL OF IMPELLA LEFT VENTRICULAR ASSIST DEVICE;  Surgeon: Grace Isaac, MD;  Location: [place];  Service: Open Heart Surgery;  Laterality: N/A;  . REPAIR OF RIGHT VENTRICLE LACERATION Left 04/12/2017   Procedure: Resection of LV Aneurysm;  Surgeon: Grace Isaac, MD;  Location: [place];  Service: Open Heart Surgery;  Laterality: Left;  . STERNAL CLOSURE N/A 04/14/2017   Procedure: STERNAL CLOSURE;  Surgeon: Grace Isaac, MD;  Location: [place];  Service: Thoracic;  Laterality: N/A;  . TEE WITHOUT CARDIOVERSION N/A 04/14/2017   Procedure: TRANSESOPHAGEAL ECHOCARDIOGRAM (TEE);  Surgeon: Grace Isaac, MD;  [facility];  Service: Thoracic;  Laterality: N/A;  . ULTRASOUND GUIDANCE FOR VASCULAR ACCESS  04/11/2017    Procedure: Ultrasound Guidance For Vascular Access;  Surgeon: Jettie Booze, MD;  Location: [place] CV LAB;  Service: Cardiovascular;;    Social History   Socioeconomic History  . Marital status: Divorced    Spouse name: Not on file  . Number of children: Not on file  . Years of education: Not on file  . Highest education level: Not on file  Occupational History  . Not on file  Social Needs  . Financial resource strain: Not on file  . Food insecurity    Worry: Not on file    Inability: Not on file  . Transportation needs    Medical: Not on file    Non-medical: Not on file  Tobacco Use  . Smoking status: Former Smoker    Packs/day: 1.50    Years: 53.00    Pack years: 79.50    Types: Cigarettes    Quit date: 04/11/2017    Years since quitting: 1.9  . Smokeless tobacco: Never  Used  Substance and Sexual Activity  . Alcohol use: Never    Frequency: Never  . Drug use: Never  . Sexual activity: Not on file  Lifestyle  . Physical activity    Days per week: 0 days    Minutes per session: 0 min  . Stress: To some extent  Relationships  . Social Herbalist on phone: Not on file    Gets together: Not on file    Attends religious service: Not on file    Active member of club or organization: Not on file    Attends meetings of clubs or organizations: Not on file    Relationship status: Not on file  Other Topics Concern  . Not on file  Social History Narrative  . Not on file    Family History  Problem Relation Age of Onset  . Esophageal cancer Mother     Health Maintenance  Topic Date Due  . COLONOSCOPY  03/05/2020 (Originally 08/24/1998)  . TETANUS/TDAP  03/05/2020 (Originally 08/24/1967)  . PNA vac Low Risk Adult (1 of 2 - PCV13) 03/05/2020 (Originally 08/23/2013)  . Hepatitis C Screening  Completed  . INFLUENZA VACCINE  Discontinued     ----------------------------------------------------------------------------------------------------------------------------------------------------------------------------------------------------------------- Physical Exam Resp 16   Ht 5' 7.5" (1.715 m)   Wt 224 lb 9.6 oz (101.9 kg)   BMI 34.66 kg/m   Physical Exam Constitutional:      General: He is not in acute distress.    Appearance: Normal appearance.  HENT:     Head: Normocephalic and atraumatic.     Right Ear: External ear normal.     Left Ear: External ear normal.  Eyes:     General: No scleral icterus. Neck:     Musculoskeletal: Normal range of motion.     Thyroid: No thyromegaly.  Cardiovascular:     Rate and Rhythm: Normal rate and regular rhythm.     Heart sounds: Normal heart sounds.  Pulmonary:     Effort: Pulmonary effort is normal.     Breath sounds: Normal breath sounds.  Abdominal:     General: Bowel sounds are normal. There is no distension.     Palpations: Abdomen is soft.     Tenderness: There is no abdominal tenderness. There is no guarding.  Lymphadenopathy:     Cervical: No cervical adenopathy.  Skin:    General: Skin is warm and dry.     Findings: No rash.  Neurological:     General: No focal deficit present.     Mental Status: He is alert and oriented to person, place, and time.     Cranial Nerves: No cranial nerve deficit.     Motor: No abnormal muscle tone.  Psychiatric:        Mood and Affect: Mood normal.        Behavior: Behavior normal.     ------------------------------------------------------------------------------------------------------------------------------------------------------------------------------------------------------------------- Assessment and Plan  Well adult exam Well adult Discussed that area on chest is not an RFID, likely wire from previous median sternotomy.  Recommend influenza and pneumonia vaccine.  He is concerned about this altering his DNA code.   Discussed with him that it is parts of killed virus that causes immune response and would not be incorporated into his DNA.  He declines -He declines colonoscopy and repeat lung cancer screening.  -Anticipatory guidance/Risk factor Reduction:  Recommend f/u with dentis to discuss extraction of teeth.  Additional recommendations per AVS Orders Placed This Encounter  Procedures  .  Comp Met (CMET)  . CBC w/Diff  . Lipid panel  . TSH

## 2019-03-06 NOTE — Assessment & Plan Note (Addendum)
Well adult Discussed that area on chest is not an RFID, likely wire from previous median sternotomy.  Recommend influenza and pneumonia vaccine.  He is concerned about this altering his DNA code.  Discussed with him that it is parts of killed virus that causes immune response and would not be incorporated into his DNA.  He declines -He declines colonoscopy and repeat lung cancer screening.  -Anticipatory guidance/Risk factor Reduction:  Recommend f/u with dentis to discuss extraction of teeth.  Additional recommendations per AVS Orders Placed This Encounter  Procedures  . Comp Met (CMET)  . CBC w/Diff  . Lipid panel  . TSH    

## 2019-03-06 NOTE — Patient Instructions (Signed)
Please schedule your next medicare wellness visit with me in 1 yr.   Continue to eat heart healthy diet (full of fruits, vegetables, whole grains, lean protein, water--limit salt, fat, and sugar intake) and increase physical activity as tolerated.  Continue doing brain stimulating activities (puzzles, reading, adult coloring books, staying active) to keep memory sharp.    [name] , Thank you for taking time to come for your Medicare Wellness Visit. I appreciate your ongoing commitment to your health goals. Please review the following plan we discussed and let me know if I can assist you in the future.   These are the goals we discussed: Goals    . DIET - INCREASE WATER INTAKE       This is a list of the screening recommended for you and due dates:  Health Maintenance  Topic Date Due  . Colon Cancer Screening  03/05/2020*  . Tetanus Vaccine  03/05/2020*  . Pneumonia vaccines (1 of 2 - PCV13) 03/05/2020*  .  Hepatitis C: One time screening is recommended by Center for Disease Control  (CDC) for  adults born from [birth year] through 1967.   Completed  . Flu Shot  Discontinued  *Topic was postponed. The date shown is not the original due date.    Health Maintenance After Age 43 After age 84, you are at a higher risk for certain long-term diseases and infections as well as injuries from falls. Falls are a major cause of broken bones and head injuries in people who are older than age 73. Getting regular preventive care can help to keep you healthy and well. Preventive care includes getting regular testing and making lifestyle changes as recommended by your health care provider. Talk with your health care provider about:  Which screenings and tests you should have. A screening is a test that checks for a disease when you have no symptoms.  A diet and exercise plan that is right for you. What should I know about screenings and tests to prevent falls? Screening and testing are the best ways to  find a health problem early. Early diagnosis and treatment give you the best chance of managing medical conditions that are common after age 9. Certain conditions and lifestyle choices may make you more likely to have a fall. Your health care provider may recommend:  Regular vision checks. Poor vision and conditions such as cataracts can make you more likely to have a fall. If you wear glasses, make sure to get your prescription updated if your vision changes.  Medicine review. Work with your health care provider to regularly review all of the medicines you are taking, including over-the-counter medicines. Ask your health care provider about any side effects that may make you more likely to have a fall. Tell your health care provider if any medicines that you take make you feel dizzy or sleepy.  Osteoporosis screening. Osteoporosis is a condition that causes the bones to get weaker. This can make the bones weak and cause them to break more easily.  Blood pressure screening. Blood pressure changes and medicines to control blood pressure can make you feel dizzy.  Strength and balance checks. Your health care provider may recommend certain tests to check your strength and balance while standing, walking, or changing positions.  Foot health exam. Foot pain and numbness, as well as not wearing proper footwear, can make you more likely to have a fall.  Depression screening. You may be more likely to have a fall if you  have a fear of falling, feel emotionally low, or feel unable to do activities that you used to do.  Alcohol use screening. Using too much alcohol can affect your balance and may make you more likely to have a fall. What actions can I take to lower my risk of falls? General instructions  Talk with your health care provider about your risks for falling. Tell your health care provider if: ? You fall. Be sure to tell your health care provider about all falls, even ones that seem minor. ?  You feel dizzy, sleepy, or off-balance.  Take over-the-counter and prescription medicines only as told by your health care provider. These include any supplements.  Eat a healthy diet and maintain a healthy weight. A healthy diet includes low-fat dairy products, low-fat (lean) meats, and fiber from whole grains, beans, and lots of fruits and vegetables. Home safety  Remove any tripping hazards, such as rugs, cords, and clutter.  Install safety equipment such as grab bars in bathrooms and safety rails on stairs.  Keep rooms and walkways well-lit. Activity   Follow a regular exercise program to stay fit. This will help you maintain your balance. Ask your health care provider what types of exercise are appropriate for you.  If you need a cane or walker, use it as recommended by your health care provider.  Wear supportive shoes that have nonskid soles. Lifestyle  Do not drink alcohol if your health care provider tells you not to drink.  If you drink alcohol, limit how much you have: ? 0-1 drink a day for women. ? 0-2 drinks a day for men.  Be aware of how much alcohol is in your drink. In the [place] one drink equals one typical bottle of beer (12 oz), one-half glass of wine (5 oz), or one shot of hard liquor (1 oz).  Do not use any products that contain nicotine or tobacco, such as cigarettes and e-cigarettes. If you need help quitting, ask your health care provider. Summary  Having a healthy lifestyle and getting preventive care can help to protect your health and wellness after age 92.  Screening and testing are the best way to find a health problem early and help you avoid having a fall. Early diagnosis and treatment give you the best chance for managing medical conditions that are more common for people who are older than age 53.  Falls are a major cause of broken bones and head injuries in people who are older than age 33. Take precautions to prevent a fall at home.  Work with  your health care provider to learn what changes you can make to improve your health and wellness and to prevent falls. This information is not intended to replace advice given to you by your health care provider. Make sure you discuss any questions you have with your health care provider. Document Released: 03/29/2017 Document Revised: 09/06/2018 Document Reviewed: 03/29/2017 Elsevier Patient Education  2020 [employer].

## 2019-03-06 NOTE — Patient Instructions (Signed)
Preventive Care 70 Years and Older, Male Preventive care refers to lifestyle choices and visits with your health care provider that can promote health and wellness. This includes:  A yearly physical exam. This is also called an annual well check.  Regular dental and eye exams.  Immunizations.  Screening for certain conditions.  Healthy lifestyle choices, such as diet and exercise. What can I expect for my preventive care visit? Physical exam Your health care provider will check:  Height and weight. These may be used to calculate body mass index (BMI), which is a measurement that tells if you are at a healthy weight.  Heart rate and blood pressure.  Your skin for abnormal spots. Counseling Your health care provider may ask you questions about:  Alcohol, tobacco, and drug use.  Emotional well-being.  Home and relationship well-being.  Sexual activity.  Eating habits.  History of falls.  Memory and ability to understand (cognition).  Work and work Statistician. What immunizations do I need?  Influenza (flu) vaccine  This is recommended every year. Tetanus, diphtheria, and pertussis (Tdap) vaccine  You may need a Td booster every 10 years. Varicella (chickenpox) vaccine  You may need this vaccine if you have not already been vaccinated. Zoster (shingles) vaccine  You may need this after age 50. Pneumococcal conjugate (PCV13) vaccine  One dose is recommended after age 24. Pneumococcal polysaccharide (PPSV23) vaccine  One dose is recommended after age 33. Measles, mumps, and rubella (MMR) vaccine  You may need at least one dose of MMR if you were born in 1957 or later. You may also need a second dose. Meningococcal conjugate (MenACWY) vaccine  You may need this if you have certain conditions. Hepatitis A vaccine  You may need this if you have certain conditions or if you travel or work in places where you may be exposed to hepatitis A. Hepatitis B vaccine   You may need this if you have certain conditions or if you travel or work in places where you may be exposed to hepatitis B. Haemophilus influenzae type b (Hib) vaccine  You may need this if you have certain conditions. You may receive vaccines as individual doses or as more than one vaccine together in one shot (combination vaccines). Talk with your health care provider about the risks and benefits of combination vaccines. What tests do I need? Blood tests  Lipid and cholesterol levels. These may be checked every 5 years, or more frequently depending on your overall health.  Hepatitis C test.  Hepatitis B test. Screening  Lung cancer screening. You may have this screening every year starting at age 70 if you have a 30-pack-year history of smoking and currently smoke or have quit within the past 15 years.  Colorectal cancer screening. All adults should have this screening starting at age 70 and continuing until age 54. Your health care provider may recommend screening at age 70 if you are at increased risk. You will have tests every 1-10 years, depending on your results and the type of screening test.  Prostate cancer screening. Recommendations will vary depending on your family history and other risks.  Diabetes screening. This is done by checking your blood sugar (glucose) after you have not eaten for a while (fasting). You may have this done every 1-3 years.  Abdominal aortic aneurysm (AAA) screening. You may need this if you are a current or former smoker.  Sexually transmitted disease (STD) testing. Follow these instructions at home: Eating and drinking  Eat  a diet that includes fresh fruits and vegetables, whole grains, lean protein, and low-fat dairy products. Limit your intake of foods with high amounts of sugar, saturated fats, and salt.  Take vitamin and mineral supplements as recommended by your health care provider.  Do not drink alcohol if your health care provider  tells you not to drink.  If you drink alcohol: ? Limit how much you have to 0-2 drinks a day. ? Be aware of how much alcohol is in your drink. In the [place] one drink equals one 12 oz bottle of beer (355 mL), one 5 oz glass of wine (148 mL), or one 1 oz glass of hard liquor (44 mL). Lifestyle  Take daily care of your teeth and gums.  Stay active. Exercise for at least 30 minutes on 5 or more days each week.  Do not use any products that contain nicotine or tobacco, such as cigarettes, e-cigarettes, and chewing tobacco. If you need help quitting, ask your health care provider.  If you are sexually active, practice safe sex. Use a condom or other form of protection to prevent STIs (sexually transmitted infections).  Talk with your health care provider about taking a low-dose aspirin or statin. What's next?  Visit your health care provider once a year for a well check visit.  Ask your health care provider how often you should have your eyes and teeth checked.  Stay up to date on all vaccines. This information is not intended to replace advice given to you by your health care provider. Make sure you discuss any questions you have with your health care provider. Document Released: 06/12/2015 Document Revised: 05/10/2018 Document Reviewed: 05/10/2018 Elsevier Patient Education  2020 [employer].

## 2019-03-11 NOTE — Progress Notes (Signed)
Labs are stable, continue current medications

## 2019-03-21 ENCOUNTER — Telehealth: Payer: Self-pay

## 2019-03-21 NOTE — Telephone Encounter (Signed)
Spoke with patient and about his lab results. I let him know they were stable and to continue taking his medications. Patient said he will and did not have any questions or concerns at this time.

## 2019-05-16 ENCOUNTER — Other Ambulatory Visit: Payer: Self-pay | Admitting: *Deleted

## 2019-05-16 MED ORDER — TICAGRELOR 90 MG PO TABS: 90.0000 mg | ORAL_TABLET | Freq: Two times a day (BID) | ORAL | 6 refills | Status: DC

## 2019-06-10 ENCOUNTER — Other Ambulatory Visit (HOSPITAL_COMMUNITY): Payer: Self-pay | Admitting: Internal Medicine

## 2019-06-10 ENCOUNTER — Other Ambulatory Visit (HOSPITAL_COMMUNITY): Payer: Self-pay

## 2019-06-10 MED ORDER — CARVEDILOL 3.125 MG PO TABS: 3.1250 mg | ORAL_TABLET | Freq: Two times a day (BID) | ORAL | 2 refills | Status: DC

## 2019-06-28 ENCOUNTER — Telehealth: Payer: Self-pay | Admitting: Interventional Cardiology

## 2019-06-28 NOTE — Telephone Encounter (Signed)
I s/w [employer] stream[employer] for [facility]. I asked if she had any other information as to procedure to be done. Whitney states the appt 07/02/19 is a consult as well as possible dental work to be done same day though not sure of the work to be done yet. I explained to Whitney depending on the type of procedure would make a difference in if any anticoags need to be held and if so how long. Whitney and I decided the best this would be to push dental appt out to Friday 07/05/19 as the pt is seeing his cardiologist Dr. Eldridge Dace 07/02/19 which at that time the pt can s/w Dr. Eldridge Dace as to the dental work he is possibly looking at having done. Both Whitney and I are in agreement to have the pt see his cardiologist first. Alphonzo Lemmings said she will call the pt and let him dental appt moved out and to discuss further with cardiologist for clearance. I will send clearance note to Dr. Eldridge Dace to assess for clearance. I will remove from the pre op call back pool.

## 2019-06-28 NOTE — Telephone Encounter (Signed)
   [facility] Pre-operative Risk Assessment    Request for surgical clearance:  1. What type of surgery is being performed? Oral Surgery - unsure of what is needed to be done at this time  2. When is this surgery scheduled? Tuesday 07/02/19   3. What type of clearance is required (medical clearance vs. Pharmacy clearance to hold med vs. Both)? medical  4. Are there any medications that need to be held prior to surgery and how long? unsure  5. Practice name and name of physician performing surgery?  Dr. Katheren Shams - [facility]  6. What is your office phone number [phone]   7.   What is your office fax number [phone]  8.   Anesthesia type (None, local, MAC, general) ? General    _____________________________________   (provider comments below)

## 2019-06-28 NOTE — Telephone Encounter (Signed)
Pre-op covering staff, can you please call requesting office for more information? What type of oral surgery? Typically, simple dental extractions do not require any specific cardiac clearance (because they are considered low risk procedures per guidelines) and do not require any interruption in blood thinner therapy.   Patient has not been seen in our office since 11/2017. He has appointment with Dr. Eldridge Dace scheduled for 07/02/2019. So if this is for anything more than a simple dental extraction, recommend it wait until after this appointment.   Thank you!

## 2019-06-28 NOTE — Telephone Encounter (Signed)
Patient has appointment with Dr. Eldridge Dace on 07/02/2019 and request form as been routed to him. Therefore, will remove from pre-op pool.
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
## 2019-07-02 ENCOUNTER — Other Ambulatory Visit: Payer: Self-pay

## 2019-07-02 ENCOUNTER — Encounter: Payer: Self-pay | Admitting: Interventional Cardiology

## 2019-07-02 ENCOUNTER — Ambulatory Visit (INDEPENDENT_AMBULATORY_CARE_PROVIDER_SITE_OTHER): Payer: Medicare HMO | Admitting: Interventional Cardiology

## 2019-07-02 VITALS — BP 118/66 | HR 54 | Ht 67.0 in | Wt 212.0 lb

## 2019-07-02 DIAGNOSIS — N1831 Chronic kidney disease, stage 3a: Secondary | ICD-10-CM

## 2019-07-02 DIAGNOSIS — I252 Old myocardial infarction: Secondary | ICD-10-CM | POA: Diagnosis not present

## 2019-07-02 DIAGNOSIS — I480 Paroxysmal atrial fibrillation: Secondary | ICD-10-CM | POA: Diagnosis not present

## 2019-07-02 DIAGNOSIS — I5022 Chronic systolic (congestive) heart failure: Secondary | ICD-10-CM | POA: Diagnosis not present

## 2019-07-02 DIAGNOSIS — I2510 Atherosclerotic heart disease of native coronary artery without angina pectoris: Secondary | ICD-10-CM | POA: Diagnosis not present

## 2019-07-02 MED ORDER — CLOPIDOGREL BISULFATE 75 MG PO TABS: 75.0000 mg | ORAL_TABLET | Freq: Every day | ORAL | 3 refills | Status: DC

## 2019-07-02 NOTE — Telephone Encounter (Signed)
OK to hold Brilinta for 5 days prior to dental extraction.   JV

## 2019-07-02 NOTE — Telephone Encounter (Signed)
Clearance request faxed via Epic.

## 2019-07-02 NOTE — Patient Instructions (Signed)
Medication Instructions:  Your physician has recommended you make the following change in your medication:   1. You may hold your Brilinta for 5 days prior to your dental procedure  2. Finish you supply of Brilinta and then start clopidogrel (plavix) 75 mg tablet: Take 1 tablet by mouth once a day  *If you need a refill on your cardiac medications before your next appointment, please call your pharmacy*  Lab Work: None ordered  If you have labs (blood work) drawn today and your tests are completely normal, you will receive your results only by: Marland Kitchen MyChart Message (if you have MyChart) OR . A paper copy in the mail If you have any lab test that is abnormal or we need to change your treatment, we will call you to review the results.  Testing/Procedures: None   Follow-Up: At [facility], you and your health needs are our priority.  As part of our continuing mission to provide you with exceptional heart care, we have created designated Provider Care Teams.  These Care Teams include your primary Cardiologist (physician) and Advanced Practice Providers (APPs -  Physician Assistants and Nurse Practitioners) who all work together to provide you with the care you need, when you need it.  Your next appointment:   12 month(s)  The format for your next appointment:   In Person  Provider:   You may see Lance Muss, MD or one of the following Advanced Practice Providers on your designated Care Team:    Ronie Spies, PA-C  Jacolyn Reedy, PA-C   Other Instructions

## 2019-07-15 ENCOUNTER — Telehealth: Payer: Self-pay | Admitting: Family Medicine

## 2019-07-15 ENCOUNTER — Ambulatory Visit (INDEPENDENT_AMBULATORY_CARE_PROVIDER_SITE_OTHER): Payer: Medicare HMO | Admitting: Physician Assistant

## 2019-07-15 ENCOUNTER — Other Ambulatory Visit: Payer: Self-pay

## 2019-07-15 ENCOUNTER — Telehealth: Payer: Self-pay | Admitting: Interventional Cardiology

## 2019-07-15 DIAGNOSIS — I2510 Atherosclerotic heart disease of native coronary artery without angina pectoris: Secondary | ICD-10-CM

## 2019-07-15 NOTE — Telephone Encounter (Signed)
Pt states he had several times during the night last night where he woke gasping for air.  Pt states he also experienced some mild CP.  Pt was sleeping on 2 pillows.  Pt reports SOB and CP have resolved at this time, denies edema.  Pt reports at his last visit with Dr Eldridge Dace he was advised to stop Brillinta and begin Plavix but he is currently still taking Brillinta.  States he is taking other medications as directed.    Appointment scheduled with B.Baghat, PA for today at 315 for further evaluation.  Pt advised if he develops SOB and or CP to go to ED.  Pt verbalizes understanding and agrees with current plan.

## 2019-07-15 NOTE — Telephone Encounter (Signed)
Patient had left a message wanting a referral for a Covid test. I left a message for him to call back to get further information.

## 2019-07-15 NOTE — Progress Notes (Signed)
Patient was added to my scheduled for CP and SOB but noted temp of 102 upon arrival to clinic> advised to go to ER but I don't see any ER encounter. Seem patient is trying to reach PCP.

## 2019-07-15 NOTE — Telephone Encounter (Signed)
New Message    Pt c/o Shortness Of Breath: STAT if SOB developed within the last 24 hours or pt is noticeably SOB on the phone  1. Are you currently SOB (can you hear that pt is SOB on the phone)? Pt says while he was sleeping he was gasping for breath, all through out the night   2. How long have you been experiencing SOB? Started last night and not yet this morning   3. Are you SOB when sitting or when up moving around? Pt says it was only while sleeping   4. Are you currently experiencing any other symptoms? Pt says his chest hurts a little bit, he is not feeling SOB now but says he felt like he was gasping for air through out the night    Please call

## 2019-07-16 NOTE — Telephone Encounter (Signed)
I called the patient back and he is going to see if he needs a referral and he is going to [facility] for [facility] test.   Patient is wanting to know if he could have a referral to Dr. Otelia Sergeant but he has a follow up for his back that he will talk more at the visit next week. No other questions. FYI

## 2019-07-17 ENCOUNTER — Telehealth: Payer: Self-pay | Admitting: Family Medicine

## 2019-07-17 NOTE — Telephone Encounter (Signed)
Patient called and wants to go to the pharmacy and get his Shingles vaccine. Since he is Medicare he will have to get those vaccines completed at [facility]. Please advise.

## 2019-07-24 ENCOUNTER — Ambulatory Visit: Payer: Medicare HMO | Admitting: Family Medicine

## 2019-08-07 ENCOUNTER — Ambulatory Visit: Payer: Medicare HMO | Admitting: Family Medicine

## 2019-08-09 ENCOUNTER — Ambulatory Visit: Payer: Medicare HMO | Admitting: Family Medicine

## 2019-09-13 ENCOUNTER — Other Ambulatory Visit: Payer: Self-pay

## 2019-09-13 ENCOUNTER — Ambulatory Visit (HOSPITAL_COMMUNITY)
Admission: EM | Admit: 2019-09-13 | Discharge: 2019-09-13 | Disposition: A | Discharge status: Home / Self Care | Payer: Medicare HMO | Attending: Internal Medicine | Admitting: Internal Medicine

## 2019-09-13 ENCOUNTER — Telehealth (HOSPITAL_COMMUNITY): Payer: Self-pay | Admitting: *Deleted

## 2019-09-13 ENCOUNTER — Encounter (HOSPITAL_COMMUNITY): Payer: Self-pay

## 2019-09-13 DIAGNOSIS — R0789 Other chest pain: Secondary | ICD-10-CM

## 2019-09-13 DIAGNOSIS — M62838 Other muscle spasm: Secondary | ICD-10-CM | POA: Diagnosis not present

## 2019-09-13 MED ORDER — MELOXICAM 7.5 MG PO TABS: 7.5000 mg | ORAL_TABLET | Freq: Every day | ORAL | 0 refills | Status: AC

## 2019-09-13 MED ORDER — CYCLOBENZAPRINE HCL 5 MG PO TABS: 5.0000 mg | ORAL_TABLET | Freq: Three times a day (TID) | ORAL | 0 refills | Status: DC | PRN

## 2019-09-13 NOTE — ED Provider Notes (Signed)
[facility]    CSN: [id number] Arrival date & time: 09/13/19  1453      History   Chief Complaint Chief Complaint  Patient presents with  . rib pain  . Back Pain    HPI [name] is a 69 y.o. male comes to urgent care complaining of right-sided chest spasms which started a day after he washed the undersurface of his car.  Patient denies a fall or being hit by any object.  He denies trauma.  He says pain comes on waves.  Is associated with muscle spasms.  Has tried over-the-counter metoprolol he is requesting for "proper pain medicine".  HPI  Past Medical History:  Diagnosis Date  . ACS (acute coronary syndrome) (HCC)   . Acute anterior wall MI (HCC)   . Acute blood loss anemia   . Acute heart failure (HCC)   . Acute hypoxemic respiratory failure (HCC)   . Acute pulmonary edema (HCC)   . Acute respiratory failure with hypoxia (HCC)   . Aneurysm of left ventricle of heart 04/12/2017  . Anoxic brain injury (HCC)   . Bacteremia   . Benign essential HTN   . CAD in native artery   . Cardiogenic shock (HCC)   . CHF (congestive heart failure), NYHA class II, acute, combined (HCC) 04/11/2017  . Chronic neck pain   . Chronic systolic (congestive) heart failure (HCC)   . Confusion   . Congestive heart failure (CHF) (HCC)   . COPD (chronic obstructive pulmonary disease) (HCC) 04/11/2017  . Coronary artery disease   . Dysphagia   . Encounter for central line placement   . Encounter for management of intra-aortic balloon pump   . Essential hypertension   . Fever   . Hyperglycemia   . Hypertension   . Hypokalemia   . Hypotension due to drugs   . Hypoxemia   . Labile blood pressure   . Leukocytosis   . Physical debility 04/27/2017  . Rash 05/15/2017  . Sepsis (HCC)   . Sore throat   . STEMI (ST elevation myocardial infarction) (HCC) 04/11/2017  . Supplemental oxygen dependent   . Tachypnea   . Ventricular aneurysm as complication of acute myocardial  infarction [facility])     Patient Active Problem List   Diagnosis Date Noted  . Well adult exam 03/06/2019  . Penile discharge 09/26/2018  . Need for hepatitis C screening test 10/06/2017  . Elevated serum creatinine 10/06/2017  . Anemia 10/06/2017  . Essential hypertension   . Confusion   . Anoxic brain injury (HCC)   . Chronic systolic (congestive) heart failure (HCC)   . CAD in native artery   . Physical debility 04/27/2017  . 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 Congestive heart failure (CHF) (HCC)   . Dysphagia   . Chronic neck pain   . Aneurysm of left ventricle of heart 04/12/2017  . Encounter for screening colonoscopy   . STEMI (ST elevation myocardial infarction) (HCC) 04/11/2017  . CHF (congestive heart failure), NYHA class II, acute, combined (HCC) 04/11/2017    Past Surgical History:  Procedure Laterality Date  . APPLICATION OF WOUND VAC N/A 04/14/2017   Procedure: APPLICATION OF WOUND VAC with reposition of Impella device;  Surgeon: Delight Ovens, MD;  [facility];  Service: Thoracic;  Laterality: N/A;  . CARDIAC CATHETERIZATION    . CORONARY STENT INTERVENTION N/A 04/11/2017   Procedure: CORONARY STENT INTERVENTION;  Surgeon: Corky Crafts,  MD;  [facility];  Service: Cardiovascular;  Laterality: N/A;  . CORONARY THROMBECTOMY N/A 04/11/2017   Procedure: Coronary Thrombectomy;  Surgeon: Corky Crafts, MD;  Location: [facility] INVASIVE CV LAB;  Service: Cardiovascular;  Laterality: N/A;  . CORONARY/GRAFT ACUTE MI REVASCULARIZATION N/A 04/11/2017   Procedure: Coronary/Graft Acute MI Revascularization;  Surgeon: Corky Crafts, MD;  Location: [facility] INVASIVE CV LAB;  Service: Cardiovascular;  Laterality: N/A;  . IABP INSERTION N/A 04/11/2017   Procedure: IABP Insertion;  Surgeon: Corky Crafts, MD;  Location: [facility] INVAS[facility] CV LAB;  Service: Cardiovascular;  Laterality: N/A;  . LEFT HEART CATH  AND CORONARY ANGIOGRAPHY N/A 04/11/2017   Procedure: LEFT HEART CATH AND CORONARY ANGIOGRAPHY;  Surgeon: Corky Crafts, MD;  Location: [facility] INVASIVE CV LAB;  Service: Cardiovascular;  Laterality: N/A;  . PLACEMENT OF IMPELLA LEFT VENTRICULAR ASSIST DEVICE  04/12/2017   Procedure: PLACEMENT OF IMPELLA  LD;  Surgeon: Delight Ovens, MD;  Location: [facility] OR;  Service: Open Heart Surgery;;  . REMOVAL OF IMPELLA LEFT VENTRICULAR ASSIST DEVICE N/A 04/17/2017   Procedure: REMOVAL OF IMPELLA LEFT VENTRICULAR ASSIST DEVICE;  Surgeon: Delight Ovens, MD;  Location[facility] OR;  Service: Open Heart Surgery;  Laterality: N/A;  . REPAIR OF RIGHT VENTRICLE LACERATION Left 04/12/2017   Procedure: Resection of LV Aneurysm;  Surgeon: Delight Ovens, MD;  Location: [facility] OR;  Service: Open Heart Surgery;  Laterality: Left;  . STERNAL CLOSURE N/A 04/14/2017   Procedure: STERNAL CLOSURE;  Surgeon: Delight Ovens, MD;  Location: [facility] OR;  Service: Thoracic;  Laterality: N/A;  . TEE WITHOUT CARDIOVERSION N/A 04/14/2017   Procedure: TRANSESOPHAGEAL ECHOCARDIOGRAM (TEE);  Surgeon: Delight Ovens, MD;  Location: [facility] OR;  Service: Thoracic;  Laterality: N/A;  . ULTRASOUND GUIDANCE FOR VASCULAR ACCESS  04/11/2017   Procedure: Ultrasound Guidance For Vascular Access;  Surgeon: Corky Crafts, MD;  Location: [facility] INVASIVE CV LAB;  Service: Cardiovascular;;       Home Medications    Prior to Admission medications   Medication Sig Start Date End Date Taking? Authorizing Provider  acetaminophen (TYLENOL) 325 MG tablet Take 1-2 tablets (325-650 mg total) by mouth every 4 (four) hours as needed for mild pain. 05/03/17   Love, Evlyn Kanner, PA-C  aspirin 81 MG chewable tablet Chew 81 mg by mouth daily.    [provider]  atorvastatin (LIPITOR) 80 MG tablet TAKE 1 TABLET BY MOUTH DAILY AT [facility] 06/10/19   Bensimhon, Bevelyn Buckles, MD  carvedilol (COREG) 3.125 MG tablet Take 1 tablet (3.125 mg total) by mouth 2  (two) times daily. 06/10/19 09/08/19  Bensimhon, Bevelyn Buckles, MD  clopidogrel (PLAVIX) 75 MG tablet Take 1 tablet (75 mg total) by mouth daily. 07/02/19   Corky Crafts, MD  cyclobenzaprine (FLEXERIL) 5 MG tablet Take 1 tablet (5 mg total) by mouth 3 (three) times daily as needed for muscle spasms. 09/13/19   Merrilee Jansky, MD  ENTRESTO 24-26 MG TAKE 1 TABLET BY MOUTH TWICE A DAY 06/10/19   Bensimhon, Bevelyn Buckles, MD  meloxicam (MOBIC) 7.5 MG tablet Take 1 tablet (7.5 mg total) by mouth daily for 7 days. 09/13/19 09/20/19  Merrilee Jansky, MD    Family History Family History  Problem Relation Age of Onset  . Esophageal cancer Mother     Social History Social History   Tobacco Use  . Smoking status: Former Smoker    Packs/day: 1.50    Years: 53.00  Pack years: 79.50    Types: Cigarettes    Quit date: 04/11/2017    Years since quitting: 2.4  . Smokeless tobacco: Never Used  Substance Use Topics  . Alcohol use: Never  . Drug use: Never     Allergies   Other and Tomato   Review of Systems Review of Systems  Constitutional: Negative.   Respiratory: Negative for chest tightness and shortness of breath.   Cardiovascular: Positive for chest pain.  Gastrointestinal: Negative.   Musculoskeletal: Positive for arthralgias and myalgias. Negative for back pain and joint swelling.  Skin: Negative.   Neurological: Negative.  Negative for headaches.     Physical Exam Triage Vital Signs ED Triage Vitals [09/13/19 1539]  Enc Vitals Group     BP      Pulse      Resp      Temp      Temp src      SpO2      Weight 200 lb (90.7 kg)     Height      Head Circumference      Peak Flow      Pain Score 10     Pain Loc      Pain Edu?      Excl. in [place]?    No data found.  Updated Vital Signs BP 122/77 (BP Location: Right Arm)   Pulse 90   Temp 98.5 F (36.9 C) (Oral)   Resp 18   Wt 90.7 kg   SpO2 98%   BMI 31.32 kg/m   Visual Acuity Right Eye Distance:   Left Eye  Distance:   Bilateral Distance:    Right Eye Near:   Left Eye Near:    Bilateral Near:     Physical Exam Vitals and nursing note reviewed.  Constitutional:      General: He is not in acute distress.    Appearance: He is not ill-appearing.  Cardiovascular:     Rate and Rhythm: Normal rate and regular rhythm.  Pulmonary:     Effort: Pulmonary effort is normal.     Breath sounds: Normal breath sounds. No stridor. No wheezing or rhonchi.  Musculoskeletal:        General: Normal range of motion.     Comments: Tenderness to palpation on the lateral aspect of the right chest.  No bruising, bleeding or rash.  Skin:    Capillary Refill: Capillary refill takes less than 2 seconds.  Neurological:     General: No focal deficit present.     Mental Status: He is alert and oriented to person, place, and time.      UC Treatments / Results  Labs (all labs ordered are listed, but only abnormal results are displayed) Labs Reviewed - No data to display  EKG   Radiology No results found.  Procedures Procedures (including critical care time)  Medications Ordered in UC Medications - No data to display  Initial Impression / Assessment and Plan / UC Course  I have reviewed the triage vital signs and the nursing notes.  Pertinent labs & imaging results that were available during my care of the patient were reviewed by me and considered in my medical decision making (see chart for details).     1.  Chest wall pain with muscle spasms: Meloxicam 7.5 mg orally daily x7 days Flexeril 5 mg twice daily as needed for muscle spasm Return precautions given No indication for chest x-ray  Final Clinical Impressions(s) / UC  Diagnoses   Final diagnoses:  Muscle spasm  Chest wall pain   Discharge Instructions   None    ED Prescriptions    Medication Sig Dispense Auth. Provider   cyclobenzaprine (FLEXERIL) 5 MG tablet Take 1 tablet (5 mg total) by mouth 3 (three) times daily as needed  for muscle spasms. 20 tablet Cristal Howatt, Britta Mccreedy, MD   meloxicam (MOBIC) 7.5 MG tablet Take 1 tablet (7.5 mg total) by mouth daily for 7 days. 7 tablet Bladimir Auman, Britta Mccreedy, MD     PDMP not reviewed this encounter.   Merrilee Jansky, MD 09/14/19 2252

## 2019-09-13 NOTE — Telephone Encounter (Signed)
Pt left VM at 2:36pm stating he pulled a few muscles in his ribs and asked that we prescribe pain medication.  I called pt back to get more information and inform him that our office does not prescribe pain medication pt did not answer/left vm. Upon creating telephone note I see that patient has checked into the emergency room.

## 2019-09-13 NOTE — ED Triage Notes (Signed)
Pt states he has rib and back spasms . Pt states he has been trying to wash his car with a long handle brush.

## 2019-10-23 ENCOUNTER — Other Ambulatory Visit (HOSPITAL_COMMUNITY): Payer: Self-pay | Admitting: Internal Medicine

## 2019-10-23 DIAGNOSIS — I2510 Atherosclerotic heart disease of native coronary artery without angina pectoris: Secondary | ICD-10-CM

## 2019-10-23 NOTE — Telephone Encounter (Signed)
Orders Placed This Encounter  Procedures  . Ambulatory referral to Cardiology    Referral Priority:   Routine    Referral Type:   Consultation    Referral Reason:   Specialty Services Required    Requested Specialty:   Cardiology    Number of Visits Requested:   1   Meds ordered this encounter  Medications  . sacubitril-valsartan (ENTRESTO) 24-26 MG    Sig: Take 1 tablet by mouth 2 (two) times daily. Follow up with [facility] for further refills    Dispense:  180 tablet    Refill:  0   Refilled patients medication and put in another referral to [facility] since patient was told to follow up with them instead of Dr. Gala Romney in 2019

## 2019-11-26 NOTE — Progress Notes (Deleted)
Cardiology Office Note    Date:  11/26/2019   ID:  [name], DOB July 14, 1950, MRN [id number]  PCP:  No primary care provider on file.  Cardiologist: Lance Muss, MD EPS: None  No chief complaint on file.   History of Present Illness:  [name] is a 69 y.o. 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  Attempted PCI of the LAD failed due to chronic occlusion and extensive infarct.  He has ischemic cardiomyopathy LVEF improved from 25% up to 40% on last echo 2019. Also has hypertension, PAF converted to normal sinus rhythm and amiodarone stopped by Dr. Gala Romney, COPD, dizziness due to chronic neck problems.    Past Medical History:  Diagnosis Date  . ACS (acute coronary syndrome) (HCC)   . Acute anterior wall MI (HCC)   . Acute blood loss anemia   . Acute heart failure (HCC)   . Acute hypoxemic respiratory failure (HCC)   . Acute pulmonary edema (HCC)   . Acute respiratory failure with hypoxia (HCC)   . Aneurysm of left ventricle of heart 04/12/2017  . Anoxic brain injury (HCC)   . Bacteremia   . Benign essential HTN   . CAD in native artery   . Cardiogenic shock (HCC)   . CHF (congestive heart failure), NYHA class II, acute, combined (HCC) 04/11/2017  . Chronic neck pain   . Chronic systolic (congestive) heart failure (HCC)   . Confusion   . Congestive heart failure (CHF) (HCC)   . COPD (chronic obstructive pulmonary disease) (HCC) 04/11/2017  . Coronary artery disease   . Dysphagia   . Encounter for central line placement   . Encounter for management of intra-aortic balloon pump   . Essential hypertension   . Fever   . Hyperglycemia   . Hypertension   . Hypokalemia   . Hypotension due to drugs   . Hypoxemia   . Labile blood pressure   . Leukocytosis   . Physical debility 04/27/2017  . Rash 05/15/2017  . Sepsis  (HCC)   . Sore throat   . STEMI (ST elevation myocardial infarction) (HCC) 04/11/2017  . Supplemental oxygen dependent   . Tachypnea   . 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 [facility])     Past Surgical History:  Procedure Laterality Date  . APPLICATION OF WOUND VAC N/A 04/14/2017   Procedure: APPLICATION OF WOUND VAC with reposition of Impella device;  Surgeon: Delight Ovens, MD;  [facility] OR;  Service: Thoracic;  Laterality: N/A;  . CARDIAC CATHETERIZATION    . CORONARY STENT INTERVENTION N/A 04/11/2017   Procedure: CORONARY STENT INTERVENTION;  Surgeon: Corky Crafts, MD;  Location: [facility] INVASIVE CV LAB;  Service: Cardiovascular;  Laterality: N/A;  . CORONARY THROMBECTOMY N/A 04/11/2017   Procedure: Coronary Thrombectomy;  Surgeon: Corky Crafts, MD;  Location: [facility] INVASIVE CV LAB;  Service: Cardiovascular;  Laterality: N/A;  . CORONARY/GRAFT ACUTE MI REVASCULARIZATION N/A 04/11/2017   Procedure: Coronary/Graft Acute MI Revascularization;  Surgeon: Corky Crafts, MD;  Location: [facility] INVASIVE CV LAB;  Service: Cardiovascular;  Laterality: N/A;  . IABP INSERTION N/A 04/11/2017   Procedure: IABP Insertion;  Surgeon: Corky Crafts, MD;  Location: [facility] INVASIVE CV LAB;  Service: Cardiovascular;  Laterality: N/A;  . LEFT HEART CATH AND CORONARY ANGIOGRAPHY N/A 04/11/2017   Procedure: LEFT HEART CATH AND  CORONARY ANGIOGRAPHY;  Surgeon: Corky CraftsVaranasi, Jayadeep S, MD;  [facility];  Service: Cardiovascular;  Laterality: N/A;  . PLACEMENT OF IMPELLA LEFT VENTRICULAR ASSIST DEVICE  04/12/2017   Procedure: PLACEMENT OF IMPELLA  LD;  Surgeon: Delight OvensGerhardt, Edward B, MD;  [facility];  Service: Open Heart Surgery;;  . REMOVAL OF IMPELLA LEFT VENTRICULAR ASSIST DEVICE N/A 04/17/2017   Procedure: REMOVAL OF IMPELLA LEFT VENTRICULAR ASSIST DEVICE;  Surgeon: Delight OvensGerhardt, Edward B, MD;  [facility];  Service: Open Heart Surgery;   Laterality: N/A;  . REPAIR OF RIGHT VENTRICLE LACERATION Left 04/12/2017   Procedure: Resection of LV Aneurysm;  Surgeon: Delight OvensGerhardt, Edward B, MD;  [facility];  Service: Open Heart Surgery;  Laterality: Left;  . STERNAL CLOSURE N/A 04/14/2017   Procedure: STERNAL CLOSURE;  Surgeon: Delight OvensGerhardt, Edward B, MD;  [facility];  Service: Thoracic;  Laterality: N/A;  . TEE WITHOUT CARDIOVERSION N/A 04/14/2017   Procedure: TRANSESOPHAGEAL ECHOCARDIOGRAM (TEE);  Surgeon: Delight OvensGerhardt, Edward B, MD;  [facility];  Service: Thoracic;  Laterality: N/A;  . ULTRASOUND GUIDANCE FOR VASCULAR ACCESS  04/11/2017   Procedure: Ultrasound Guidance For Vascular Access;  Surgeon: Corky CraftsVaranasi, Jayadeep S, MD;  [facility];  Service: Cardiovascular;;    Current Medications: No outpatient medications have been marked as taking for the 11/27/19 encounter (Appointment) with Dyann KiefLenze, Tyeler Goedken M, PA-C.     Allergies:   Other and Tomato   Social History   Socioeconomic History  . Marital status: Divorced    Spouse name: Not on file  . Number of children: Not on file  . Years of education: Not on file  . Highest education level: Not on file  Occupational History  . Not on file  Tobacco Use  . Smoking status: Former Smoker    Packs/day: 1.50    Years: 53.00    Pack years: 79.50    Types: Cigarettes    Quit date: 04/11/2017    Years since quitting: 2.6  . Smokeless tobacco: Never Used  Vaping Use  . Vaping Use: Never used  Substance and Sexual Activity  . Alcohol use: Never  . Drug use: Never  . Sexual activity: Not on file  Other Topics Concern  . Not on file  Social History Narrative  . Not on file   Social Determinants of Health   Financial Resource Strain:   . Difficulty of Paying Living Expenses:   Food Insecurity:   . Worried About Programme researcher, broadcasting/film/videounning Out of Food in the Last Year:   . Baristaan Out of Food in the Last Year:   Transportation Needs:   . Freight forwarderLack of Transportation (Medical):   Marland Kitchen.  Lack of Transportation (Non-Medical):   Physical Activity:   . Days of Exercise per Week:   . Minutes of Exercise per Session:   Stress:   . Feeling of Stress :   Social Connections:   . Frequency of Communication with Friends and Family:   . Frequency of Social Gatherings with Friends and Family:   . Attends Religious Services:   . Active Member of Clubs or Organizations:   . Attends BankerClub or Organization Meetings:   Marland Kitchen. Marital Status:      Family History:  The patient's family history includes Esophageal cancer in his mother.   ROS:   Please see the history of present illness.    ROS All other systems reviewed and are negative.   PHYSICAL EXAM:   VS:  There were  no vitals taken for this visit.  Physical Exam  GEN: Well nourished, well developed, in no acute distress  Neck: no JVD, carotid bruits, or masses Cardiac:RRR; no murmurs, rubs, or gallops  Respiratory:  clear to auscultation bilaterally, normal work of breathing GI: soft, nontender, nondistended, + BS Ext: without cyanosis, clubbing, or edema, Good distal pulses bilaterally Neuro:  Alert and Oriented x 3 Psych: euthymic mood, full affect  Wt Readings from Last 3 Encounters:  09/13/19 200 lb (90.7 kg)  07/02/19 212 lb (96.2 kg)  03/06/19 224 lb (101.6 kg)      Studies/Labs Reviewed:   EKG:  EKG is*** ordered today.  The ekg ordered today demonstrates ***  Recent Labs: 03/06/2019: ALT 12; BUN 23; Creatinine, Ser 1.35; Hemoglobin 12.3; Platelets 285.0; Potassium 4.9; Sodium 139; TSH 0.81   Lipid Panel    Component Value Date/Time   CHOL 107 03/06/2019 1347   CHOL 99 (L) 07/28/2017 1025   TRIG 82.0 03/06/2019 1347   HDL 28.00 (L) 03/06/2019 1347   HDL 33 (L) 07/28/2017 1025   CHOLHDL 4 03/06/2019 1347   VLDL 16.4 03/06/2019 1347   LDLCALC 62 03/06/2019 1347   LDLCALC 55 07/28/2017 1025    Additional studies/ records that were reviewed today include:   2D echo 07/2017 Study Conclusions   - Left  ventricle: The cavity size was normal. Wall thickness was    normal. Systolic function was mildly reduced. Doppler parameters    are consistent with abnormal left ventricular relaxation (grade 1    diastolic dysfunction). Suspect severe hypokinesis or akinesis of    the apex and apical segments of the anterior and anteroseptal    walls. Overall EF may be approximately 40%.  - Ventricular septum: Septal motion showed paradox. These changes    are consistent with a post-thoracotomy state.  - Mitral valve: Calcified annulus.   Impressions:   - Normal contractility of the basal LV wall segments. Even with    Definity contrast, imaging of the apical segments is mediocre.    Suspect at least severe hypokinesis, possibly akinesis of the    mid-LAD territory wall segments and overall mildly depressed    systolic function.   12/102018 ECHO  Left ventricle: The cavity size was normal. Wall thickness was   increased in a pattern of mild LVH. Systolic function was   moderately reduced. The estimated ejection fraction was in the   range of 35% to 40%. Global hypokinesis with severe hypokinesis   of the inferior, apical and mid-apical inferoseptal myocardium.   Features are consistent with a pseudonormal left ventricular   filling pattern, with concomitant abnormal relaxation and   increased filling pressure (grade 2 diastolic dysfunction).   Doppler parameters are consistent with indeterminate ventricular   filling pressure. There was an apicalthrombus. - Aortic valve: Transvalvular velocity was within the normal range.   There was no stenosis. There was no regurgitation. - Mitral valve: Transvalvular velocity was within the normal range.   There was no evidence for stenosis. There was no regurgitation. - Right ventricle: The cavity size was normal. Wall thickness was   normal. Systolic function was normal. - Atrial septum: No defect or patent foramen ovale was identified.     ASSESSMENT:      1. Coronary artery disease involving native coronary artery of native heart without angina pectoris   2. Ischemic cardiomyopathy   3. Paroxysmal atrial fibrillation (HCC)   4. Stage 3a chronic kidney disease  PLAN:  In order of problems listed above:  CAD hospital inferior MI complicated by cardiogenic shock and LV pseudoaneurysm required repair 04/12/2017 treated with DES to the RCA, CTO of LAD with failed PCI  Ischemic cardiomyopathy LVEF improved to 40% 2019  PAF on amiodarone in the past but stopped by Dr. Gala Romney maintaining normal sinus rhythm.  Has had anemia and bleeding issues in the past so does not want stronger blood thinners  CKD stage III A  Medication Adjustments/Labs and Tests Ordered: Current medicines are reviewed at length with the patient today.  Concerns regarding medicines are outlined above.  Medication changes, Labs and Tests ordered today are listed in the Patient Instructions below. There are no Patient Instructions on file for this visit.   Elson Clan, PA-C  11/26/2019 9:48 AM    [facility] [address] [place], [place], [place]  [address] Phone: [phone]; Fax: [phone]

## 2019-11-27 ENCOUNTER — Ambulatory Visit: Payer: Medicare HMO | Admitting: Physician Assistant

## 2020-01-01 NOTE — Progress Notes (Signed)
Cardiology Office Note    Date:  01/07/2020   ID:  [name], DOB 27-Jan-1951, MRN [id number]  PCP:  Everrett Coombe, DO  Cardiologist: Lance Muss, MD EPS: None  Chief Complaint  Patient presents with  . Follow-up    History of Present Illness:  [name] is a 69 y.o. 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 Chronic systolic CHF secondary cardiogenic shock EF 25% improved 55% echo 08/22/17. HTN, PAF converted NSR-Amio stopped by Dr. Gala Romney, Because of bleeding issues in the past he didn't want stronger blood thinners, COPD.  Saw Dr. Eldridge Dace 07/02/19 to get established.  Denies chest pain, dyspnea, dizziness, presyncope. Has indigestion in his chest if he lays down after he eats relieved with belching. Walks some and denies any exertional symptoms.  Doesn't plan on getting vaccinated.   Past Medical History:  Diagnosis Date  . ACS (acute coronary syndrome) (HCC)   . Acute anterior wall MI (HCC)   . Acute blood loss anemia   . Acute heart failure (HCC)   . Acute hypoxemic respiratory failure (HCC)   . Acute pulmonary edema (HCC)   . Acute respiratory failure with hypoxia (HCC)   . Aneurysm of left ventricle of heart 04/12/2017  . Anoxic brain injury (HCC)   . Bacteremia   . Benign essential HTN   . CAD in native artery   . Cardiogenic shock (HCC)   . CHF (congestive heart failure), NYHA class II, acute, combined (HCC) 04/11/2017  . Chronic neck pain   . Chronic systolic (congestive) heart failure (HCC)   . Confusion   . Congestive heart failure (CHF) (HCC)   . COPD (chronic obstructive pulmonary disease) (HCC) 04/11/2017  . Coronary artery disease   . Dysphagia   . Encounter for central line placement   . Encounter for management of intra-aortic balloon pump   . Essential hypertension   . Fever   . Hyperglycemia   . Hypertension   . Hypokalemia     . Hypotension due to drugs   . Hypoxemia   . Labile blood pressure   . Leukocytosis   . Physical debility 04/27/2017  . Rash 05/15/2017  . Sepsis (HCC)   . Sore throat   . STEMI (ST elevation myocardial infarction) (HCC) 04/11/2017  . Supplemental oxygen dependent   . Tachypnea   . 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)     Past Surgical History:  Procedure Laterality Date  . APPLICATION OF WOUND VAC N/A 04/14/2017   Procedure: APPLICATION OF WOUND VAC with reposition of Impella device;  Surgeon: Delight Ovens, MD;  Location: [facility] OR;  Service: Thoracic;  Laterality: N/A;  . CARDIAC CATHETERIZATION    . CORONARY STENT INTERVENTION N/A 04/11/2017   Procedure: CORONARY STENT INTERVENTION;  Surgeon: Corky Crafts, MD;  Location: [facility] INVASIVE CV LAB;  Service: Cardiovascular;  Laterality: N/A;  . CORONARY THROMBECTOMY N/A 04/11/2017   Procedure: Coronary Thrombectomy;  Surgeon: Corky Crafts, MD;  Location: [facility] INVASIVE CV LAB;  Service: Cardiovascular;  Laterality: N/A;  . CORONARY/GRAFT ACUTE MI REVASCULARIZATION N/A 04/11/2017   Procedure: Coronary/Graft Acute MI Revascularization;  Surgeon: Corky Crafts, MD;  Location: [facility] INVASIVE CV LAB;  Service: Cardiovascular;  Laterality: N/A;  . IABP INSERTION N/A 04/11/2017   Procedure: IABP Insertion;  Surgeon: Corky Crafts, MD;  Location: [facility] INVASIVE CV LAB;  Service:  Cardiovascular;  Laterality: N/A;  . LEFT HEART CATH AND CORONARY ANGIOGRAPHY N/A 04/11/2017   Procedure: LEFT HEART CATH AND CORONARY ANGIOGRAPHY;  Surgeon: Corky Crafts, MD;  Location: [facility] INVASIVE CV LAB;  Service: Cardiovascular;  Laterality: N/A;  . PLACEMENT OF IMPELLA LEFT VENTRICULAR ASSIST DEVICE  04/12/2017   Procedure: PLACEMENT OF IMPELLA  LD;  Surgeon: Delight Ovens, MD;  Location: [facility] OR;  Service: Open Heart Surgery;;  . REMOVAL OF IMPELLA LEFT VENTRICULAR ASSIST DEVICE N/A 04/17/2017    Procedure: REMOVAL OF IMPELLA LEFT VENTRICULAR ASSIST DEVICE;  Surgeon: Delight Ovens, MD;  Location: [facility] OR;  Service: Open Heart Surgery;  Laterality: N/A;  . REPAIR OF RIGHT VENTRICLE LACERATION Left 04/12/2017   Procedure: Resection of LV Aneurysm;  Surgeon: Delight Ovens, MD;  Location: [facility] OR;  Service: Open Heart Surgery;  Laterality: Left;  . STERNAL CLOSURE N/A 04/14/2017   Procedure: STERNAL CLOSURE;  Surgeon: Delight Ovens, MD;  Location: [facility] OR;  Service: Thoracic;  Laterality: N/A;  . TEE WITHOUT CARDIOVERSION N/A 04/14/2017   Procedure: TRANSESOPHAGEAL ECHOCARDIOGRAM (TEE);  Surgeon: Delight Ovens, MD;  Location: [facility] OR;  Service: Thoracic;  Laterality: N/A;  . ULTRASOUND GUIDANCE FOR VASCULAR ACCESS  04/11/2017   Procedure: Ultrasound Guidance For Vascular Access;  Surgeon: Corky Crafts, MD;  Location: [facility] INVASIVE CV LAB;  Service: Cardiovascular;;    Current Medications: Current Meds  Medication Sig  . acetaminophen (TYLENOL) 325 MG tablet Take 1-2 tablets (325-650 mg total) by mouth every 4 (four) hours as needed for mild pain.  Marland Kitchen aspirin 81 MG chewable tablet Chew 81 mg by mouth daily.  Marland Kitchen atorvastatin (LIPITOR) 80 MG tablet TAKE 1 TABLET BY MOUTH DAILY AT 6PM  . carvedilol (COREG) 3.125 MG tablet Take 1 tablet (3.125 mg total) by mouth 2 (two) times daily.  . clopidogrel (PLAVIX) 75 MG tablet Take 1 tablet (75 mg total) by mouth daily.  . cyclobenzaprine (FLEXERIL) 5 MG tablet Take 1 tablet (5 mg total) by mouth 3 (three) times daily as needed for muscle spasms.  . sacubitril-valsartan (ENTRESTO) 24-26 MG Take 1 tablet by mouth 2 (two) times daily. Follow up with [facility] for further refills     Allergies:   Other and Tomato   Social History   Socioeconomic History  . Marital status: Divorced    Spouse name: Not on file  . Number of children: Not on file  . Years of education: Not on file  . Highest education level: Not on file    Occupational History  . Not on file  Tobacco Use  . Smoking status: Former Smoker    Packs/day: 1.50    Years: 53.00    Pack years: 79.50    Types: Cigarettes    Quit date: 04/11/2017    Years since quitting: 2.7  . Smokeless tobacco: Never Used  Vaping Use  . Vaping Use: Never used  Substance and Sexual Activity  . Alcohol use: Never  . Drug use: Never  . Sexual activity: Not on file  Other Topics Concern  . Not on file  Social History Narrative  . Not on file   Social Determinants of Health   Financial Resource Strain:   . Difficulty of Paying Living Expenses:   Food Insecurity:   . Worried About Programme researcher, broadcasting/film/video in the Last Year:   . Barista in the Last Year:   Transportation Needs:   . Lack  of Transportation (Medical):   Marland Kitchen Lack of Transportation (Non-Medical):   Physical Activity:   . Days of Exercise per Week:   . Minutes of Exercise per Session:   Stress:   . Feeling of Stress :   Social Connections:   . Frequency of Communication with Friends and Family:   . Frequency of Social Gatherings with Friends and Family:   . Attends Religious Services:   . Active Member of Clubs or Organizations:   . Attends Banker Meetings:   Marland Kitchen Marital Status:      Family History:  The patient's   family history includes Esophageal cancer in his mother.   ROS:   Please see the history of present illness.    ROS All other systems reviewed and are negative.   PHYSICAL EXAM:   VS:  BP 118/64   Pulse 67   Ht 5\' 7"  (1.702 m)   Wt 210 lb 6.4 oz (95.4 kg)   SpO2 95%   BMI 32.95 kg/m   Physical Exam  , in no acute distress  Neck: no JVD, carotid bruits, or masses Cardiac:RRR; no murmurs, rubs, or gallops  Respiratory:  clear to auscultation bilaterally, normal work of breathing GI: soft, nontender, nondistended, + BS Ext: without cyanosis, clubbing, or edema, Good distal pulses bilaterally Neuro:  Alert and Oriented x 3 Psych: euthymic  mood, full affect  Wt Readings from Last 3 Encounters:  01/07/20 210 lb 6.4 oz (95.4 kg)  09/13/19 200 lb (90.7 kg)  07/02/19 212 lb (96.2 kg)      Studies/Labs Reviewed:   EKG:  EKG is not ordered today.    Recent Labs: 03/06/2019: ALT 12; BUN 23; Creatinine, Ser 1.35; Hemoglobin 12.3; Platelets 285.0; Potassium 4.9; Sodium 139; TSH 0.81   Lipid Panel    Component Value Date/Time   CHOL 107 03/06/2019 1347   CHOL 99 (L) 07/28/2017 1025   TRIG 82.0 03/06/2019 1347   HDL 28.00 (L) 03/06/2019 1347   HDL 33 (L) 07/28/2017 1025   CHOLHDL 4 03/06/2019 1347   VLDL 16.4 03/06/2019 1347   LDLCALC 62 03/06/2019 1347   LDLCALC 55 07/28/2017 1025    Additional studies/ records that were reviewed today include:   Echo 2019 Study Conclusions   - Left ventricle: The cavity size was normal. Wall thickness was    normal. Systolic function was mildly reduced. Doppler parameters    are consistent with abnormal left ventricular relaxation (grade 1    diastolic dysfunction). Suspect severe hypokinesis or akinesis of    the apex and apical segments of the anterior and anteroseptal    walls. Overall EF may be approximately 40%.  - Ventricular septum: Septal motion showed paradox. These changes    are consistent with a post-thoracotomy state.  - Mitral valve: Calcified annulus.   Impressions:   - Normal contractility of the basal LV wall segments. Even with    Definity contrast, imaging of the apical segments is mediocre.    Suspect at least severe hypokinesis, possibly akinesis of the    mid-LAD territory wall segments and overall mildly depressed    systolic function.    Cath 2018  Mid LAD-2 lesion is 100% stenosed.  Balloon angioplasty was performed using a BALLOON SAPPHIRE 2.5X12. Post intervention, there is a 25% residual stenosis with significant no reflow in the apical LAD.  Dist RCA lesion is 95% stenosed. A drug-eluting stent was successfully placed using a STENT SYNERGY  DES 3.5X28.  Post  intervention, there is a 0% residual stenosis.  LV end diastolic pressure is severely elevated.  There is no aortic valve stenosis.  Ost 1st Diag lesion is 50% stenosed.  Proximal ramus is 70% stenosed.  IABP placed due to high LVEDP.   Continue IV Angiomax for the current bag.  He will need dual antiplatelet therapy for ideally 1 year.  He will need echocardiogram to evaluate left ventricular function.  I suspect his LV function is severely decreased based on his LVEDP of 44 mmHg.   Continue aggressive secondary prevention, including smoking cessation.     Appreciate assistance from [facility] team for vent management.     IV Lasix ordered.     ASSESSMENT:    1. Coronary artery disease involving native coronary artery of native heart without angina pectoris   2. Ischemic cardiomyopathy   3. Paroxysmal atrial fibrillation (HCC)   4. Chronic renal impairment, stage 3a      PLAN:  In order of problems listed above:  CAD hospital inferior MI complicated by cardiogenic shock and LV pseudoaneurysm required repair 04/12/2017 treated with DES to the RCA, CTO of LAD with failed PCI. No angina or bleeding problems. Continue Plavix/ASA/lipitor  Ischemic cardiomyopathy LVEF improved to 40% 2019 no CHF on exam on entresto and coreg  PAF on amiodarone in the past but stopped by Dr. Gala RomneyBensimhon maintaining normal sinus rhythm.  Has had anemia and bleeding issues in the past so does not want stronger blood thinners.   CKD stage III A check labs today.      Medication Adjustments/Labs and Tests Ordered: Current medicines are reviewed at length with the patient today.  Concerns regarding medicines are outlined above.  Medication changes, Labs and Tests ordered today are listed in the Patient Instructions below. Patient Instructions  Medication Instructions:  Your physician recommends that you continue on your current medications as directed. Please refer to the Current  Medication list given to you today.  *If you need a refill on your cardiac medications before your next appointment, please call your pharmacy*   Lab Work: TODAY: CMET, CBC, FLP  If you have labs (blood work) drawn today and your tests are completely normal, you will receive your results only by: Marland Kitchen. MyChart Message (if you have MyChart) OR . A paper copy in the mail If you have any lab test that is abnormal or we need to change your treatment, we will call you to review the results.   Testing/Procedures: None   Follow-Up: At [facility], you and your health needs are our priority.  As part of our continuing mission to provide you with exceptional heart care, we have created designated Provider Care Teams.  These Care Teams include your primary Cardiologist (physician) and Advanced Practice Providers (APPs -  Physician Assistants and Nurse Practitioners) who all work together to provide you with the care you need, when you need it.  We recommend signing up for the patient portal called "MyChart".  Sign up information is provided on this After Visit Summary.  MyChart is used to connect with patients for Virtual Visits (Telemedicine).  Patients are able to view lab/test results, encounter notes, upcoming appointments, etc.  Non-urgent messages can be sent to your provider as well.   To learn more about what you can do with MyChart, go to [email or web address].    Your next appointment:   6 month(s)  The format for your next appointment:   In Person  Provider:   You may see  Lance Muss, MD or one of the following Advanced Practice Providers on your designated Care Team:    Ronie Spies, PA-C  Jacolyn Reedy, PA-C    Other Instructions None     Signed, Jacolyn Reedy, PA-C  01/07/2020 1:14 PM    [facility] [address] [place], [place], [place]  [address] Phone: [phone]; Fax: [phone]

## 2020-01-07 ENCOUNTER — Encounter: Payer: Self-pay | Admitting: Physician Assistant

## 2020-01-07 ENCOUNTER — Other Ambulatory Visit: Payer: Self-pay

## 2020-01-07 ENCOUNTER — Ambulatory Visit (INDEPENDENT_AMBULATORY_CARE_PROVIDER_SITE_OTHER): Payer: Medicare Other | Admitting: Physician Assistant

## 2020-01-07 VITALS — BP 118/64 | HR 67 | Ht 67.0 in | Wt 210.4 lb

## 2020-01-07 DIAGNOSIS — I2510 Atherosclerotic heart disease of native coronary artery without angina pectoris: Secondary | ICD-10-CM

## 2020-01-07 DIAGNOSIS — I255 Ischemic cardiomyopathy: Secondary | ICD-10-CM | POA: Diagnosis not present

## 2020-01-07 DIAGNOSIS — N1831 Chronic kidney disease, stage 3a: Secondary | ICD-10-CM

## 2020-01-07 DIAGNOSIS — I480 Paroxysmal atrial fibrillation: Secondary | ICD-10-CM

## 2020-01-07 NOTE — Patient Instructions (Signed)
Medication Instructions:  Your physician recommends that you continue on your current medications as directed. Please refer to the Current Medication list given to you today.  *If you need a refill on your cardiac medications before your next appointment, please call your pharmacy*   Lab Work: TODAY: CMET, CBC, FLP  If you have labs (blood work) drawn today and your tests are completely normal, you will receive your results only by: Marland Kitchen MyChart Message (if you have MyChart) OR . A paper copy in the mail If you have any lab test that is abnormal or we need to change your treatment, we will call you to review the results.   Testing/Procedures: None   Follow-Up: At [facility], you and your health needs are our priority.  As part of our continuing mission to provide you with exceptional heart care, we have created designated Provider Care Teams.  These Care Teams include your primary Cardiologist (physician) and Advanced Practice Providers (APPs -  Physician Assistants and Nurse Practitioners) who all work together to provide you with the care you need, when you need it.  We recommend signing up for the patient portal called "MyChart".  Sign up information is provided on this After Visit Summary.  MyChart is used to connect with patients for Virtual Visits (Telemedicine).  Patients are able to view lab/test results, encounter notes, upcoming appointments, etc.  Non-urgent messages can be sent to your provider as well.   To learn more about what you can do with MyChart, go to [email or web address].    Your next appointment:   6 month(s)  The format for your next appointment:   In Person  Provider:   You may see Lance Muss, MD or one of the following Advanced Practice Providers on your designated Care Team:    Ronie Spies, PA-C  Jacolyn Reedy, PA-C    Other Instructions None

## 2020-01-08 LAB — COMPREHENSIVE METABOLIC PANEL
ALT: 10 IU/L (ref 0–44)
AST: 12 IU/L (ref 0–40)
Albumin/Globulin Ratio: 1.1 — ABNORMAL LOW (ref 1.2–2.2)
Albumin: 3.7 g/dL (ref 3.7–4.7)
Alkaline Phosphatase: 93 IU/L (ref 48–121)
BUN/Creatinine Ratio: 12 (ref 10–24)
BUN: 14 mg/dL (ref 8–27)
Bilirubin Total: 0.3 mg/dL (ref 0.0–1.2)
CO2: 26 mmol/L (ref 20–29)
Calcium: 9 mg/dL (ref 8.6–10.2)
Chloride: 103 mmol/L (ref 96–106)
Creatinine, Ser: 1.2 mg/dL (ref 0.76–1.27)
GFR calc Af Amer: 70 mL/min/{1.73_m2} (ref >59)
GFR calc non Af Amer: 60 mL/min/{1.73_m2} (ref >59)
Globulin, Total: 3.4 g/dL (ref 1.5–4.5)
Glucose: 92 mg/dL (ref 65–99)
Potassium: 4.6 mmol/L (ref 3.5–5.2)
Sodium: 140 mmol/L (ref 134–144)
Total Protein: 7.1 g/dL (ref 6.0–8.5)

## 2020-01-08 LAB — CBC
Hematocrit: 36.3 % — ABNORMAL LOW (ref 37.5–51.0)
Hemoglobin: 11.3 g/dL — ABNORMAL LOW (ref 13.0–17.7)
MCH: 24.5 pg — ABNORMAL LOW (ref 26.6–33.0)
MCHC: 31.1 g/dL — ABNORMAL LOW (ref 31.5–35.7)
MCV: 79 fL (ref 79–97)
Platelets: 272 10*3/uL (ref 150–450)
RBC: 4.61 x10E6/uL (ref 4.14–5.80)
RDW: 15.8 % — ABNORMAL HIGH (ref 11.6–15.4)
WBC: 10 10*3/uL (ref 3.4–10.8)

## 2020-01-08 LAB — LIPID PANEL
Chol/HDL Ratio: 4 ratio (ref 0.0–5.0)
Cholesterol, Total: 103 mg/dL (ref 100–199)
HDL: 26 mg/dL — ABNORMAL LOW (ref >39)
LDL Chol Calc (NIH): 63 mg/dL (ref 0–99)
Triglycerides: 60 mg/dL (ref 0–149)
VLDL Cholesterol Cal: 14 mg/dL (ref 5–40)

## 2020-01-19 ENCOUNTER — Other Ambulatory Visit (HOSPITAL_COMMUNITY): Payer: Self-pay | Admitting: Internal Medicine

## 2020-01-21 MED ORDER — ENTRESTO 24-26 MG PO TABS: 1.0000 | ORAL_TABLET | Freq: Two times a day (BID) | ORAL | 3 refills | Status: DC

## 2020-02-13 DIAGNOSIS — D509 Iron deficiency anemia, unspecified: Secondary | ICD-10-CM | POA: Diagnosis not present

## 2020-02-13 DIAGNOSIS — Z0001 Encounter for general adult medical examination with abnormal findings: Secondary | ICD-10-CM | POA: Diagnosis not present

## 2020-02-13 DIAGNOSIS — I110 Hypertensive heart disease with heart failure: Secondary | ICD-10-CM | POA: Diagnosis not present

## 2020-02-15 DIAGNOSIS — Z1211 Encounter for screening for malignant neoplasm of colon: Secondary | ICD-10-CM | POA: Diagnosis not present

## 2020-03-03 ENCOUNTER — Telehealth: Payer: Self-pay

## 2020-03-03 NOTE — Telephone Encounter (Signed)
Dr. Eldridge Dace This patient has a history of inferior MI treated with DES to RCA in 2018, also has CTO of LAD with failed PCI. We are asked to hold plavix to extract multiple teeth. OK to hold for 5-7 days?

## 2020-03-03 NOTE — Telephone Encounter (Signed)
   [facility] Pre-operative Risk Assessment    HEARTCARE STAFF: - Please ensure there is not already an duplicate clearance open for this procedure. - Under Visit Info/Reason for Call, type in Other and utilize the format Clearance MM/DD/YY or Clearance TBD. Do not use dashes or single digits. - If request is for dental extraction, please clarify the # of teeth to be extracted.  Request for surgical clearance:  1. What type of surgery is being performed? Removal of multiple teeth with alveoloplasty    2. When is this surgery scheduled? TBD   3. What type of clearance is required (medical clearance vs. Pharmacy clearance to hold med vs. Both)?  Both   4. Are there any medications that need to be held prior to surgery and how long? Plavix    5. Practice name and name of physician performing surgery?Diona Browner D.M.D [facility], Dr. Diona Browner 6.     7. What is the office phone number? [phone]   7.   What is the office fax number? [phone]  8.   Anesthesia type (None, local, MAC, general) ? Local/Nitrous    Mendel Ryder 03/03/2020, 4:37 PM  _________________________________________________________________   (provider comments below)

## 2020-03-03 NOTE — Telephone Encounter (Signed)
OK to hold Plavix for 5 days prior.   JV

## 2020-03-04 NOTE — Telephone Encounter (Signed)
   Primary Cardiologist: Lance Muss, MD  Chart reviewed as part of pre-operative protocol coverage. Given past medical history and time since last visit, based on ACC/AHA guidelines, Timathy Newberry would be at acceptable risk for the planned procedure without further cardiovascular testing.   He may hold his Plavix for 5 days prior to his procedure.  Please resume as soon as hemostasis is achieved.  I will route this recommendation to the requesting party via Epic fax function and remove from pre-op pool.  Please call with questions.  Thomasene Ripple. Kalesha Irving NP-C    03/04/2020, 8:18 AM [facility] [address] Office [phone] Fax [phone]

## 2020-06-01 ENCOUNTER — Other Ambulatory Visit: Payer: Self-pay | Admitting: Interventional Cardiology

## 2020-06-10 ENCOUNTER — Other Ambulatory Visit: Payer: Self-pay | Admitting: Interventional Cardiology
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
## 2020-07-01 ENCOUNTER — Ambulatory Visit: Payer: Medicare Other | Admitting: Interventional Cardiology

## 2020-07-28 ENCOUNTER — Other Ambulatory Visit: Payer: Self-pay

## 2020-07-29 ENCOUNTER — Encounter: Payer: Self-pay | Admitting: Family Medicine

## 2020-07-29 ENCOUNTER — Ambulatory Visit (INDEPENDENT_AMBULATORY_CARE_PROVIDER_SITE_OTHER): Payer: Medicare Other | Admitting: Family Medicine

## 2020-07-29 VITALS — BP 128/76 | HR 80 | Temp 97.6°F | Ht 67.0 in | Wt 226.0 lb

## 2020-07-29 DIAGNOSIS — R252 Cramp and spasm: Secondary | ICD-10-CM

## 2020-07-29 DIAGNOSIS — M79641 Pain in right hand: Secondary | ICD-10-CM | POA: Diagnosis not present

## 2020-07-29 DIAGNOSIS — R200 Anesthesia of skin: Secondary | ICD-10-CM

## 2020-07-29 DIAGNOSIS — R202 Paresthesia of skin: Secondary | ICD-10-CM | POA: Diagnosis not present

## 2020-07-29 LAB — BASIC METABOLIC PANEL
BUN: 15 mg/dL (ref 6–23)
CO2: 28 mEq/L (ref 19–32)
Calcium: 9 mg/dL (ref 8.4–10.5)
Chloride: 103 mEq/L (ref 96–112)
Creatinine, Ser: 1.13 mg/dL (ref 0.40–1.50)
GFR: 65.2 mL/min (ref >60.00)
Glucose, Bld: 87 mg/dL (ref 70–99)
Potassium: 4.3 mEq/L (ref 3.5–5.1)
Sodium: 137 mEq/L (ref 135–145)

## 2020-07-29 LAB — MAGNESIUM: Magnesium: 1.8 mg/dL (ref 1.5–2.5)

## 2020-07-29 NOTE — Patient Instructions (Signed)
Increase water intake with goal of at least 64oz per day Try to increase dietary potassium Can try OTC magnesium supplement

## 2020-07-29 NOTE — Progress Notes (Signed)
[name] is a 70 y.o. male  Chief Complaint  Patient presents with  . Establish Care    TOC- establish care and discuss getting referral to Ortho.   Also having some cramps in his hands/feet/legs.  Declines both flu and covid vaccines.     HPI: [name] is a 70 y.o. male patient previously seen by Dr. Ashley Royalty here today for [facility] appt and to discuss referral to ortho Dr. Amanda Pea (who has seen pt for other issue) for Rt hand pain, tingling, numbness since 2019 when he got stuck in the hand but a jagged piece of plastic from a clothing tag. Pt states it has gotten progressively worse and at night he has times when he cannot move his hand. Pt wonders about bisphenol toxicity.   Pt complains of cramping in hands/feet/legs that is intermittent and has been going on for just under a year.  He states he drinks 4-6 bottles of water per day. He is limited in his activity due to his back and leg pain. No swelling.  Last labs in 12/2019 - normal lytes.   Pt declines flu and covid vaccines.   Past Medical History:  Diagnosis Date  . ACS (acute coronary syndrome) (HCC)   . Acute anterior wall MI (HCC)   . Acute blood loss anemia   . Acute heart failure (HCC)   . Acute hypoxemic respiratory failure (HCC)   . Acute pulmonary edema (HCC)   . Acute respiratory failure with hypoxia (HCC)   . Aneurysm of left ventricle of heart 04/12/2017  . Anoxic brain injury (HCC)   . Bacteremia   . Benign essential HTN   . CAD in native artery   . Cardiogenic shock (HCC)   . CHF (congestive heart failure), NYHA class II, acute, combined (HCC) 04/11/2017  . Chronic neck pain   . Chronic systolic (congestive) heart failure (HCC)   . Confusion   . Congestive heart failure (CHF) (HCC)   . COPD (chronic obstructive pulmonary disease) (HCC) 04/11/2017  . Coronary artery disease   . Dysphagia   . Encounter for central line placement   . Encounter for management of intra-aortic balloon pump   . Essential  hypertension   . Fever   . Hyperglycemia   . Hypertension   . Hypokalemia   . Hypotension due to drugs   . Hypoxemia   . Labile blood pressure   . Leukocytosis   . Physical debility 04/27/2017  . Rash 05/15/2017  . Sepsis (HCC)   . Sore throat   . STEMI (ST elevation myocardial infarction) (HCC) 04/11/2017  . Supplemental oxygen dependent   . Tachypnea   . 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 APPLICATION OF WOUND VAC N/A 04/14/2017   Procedure: APPLICATION OF WOUND VAC with reposition of Impella device;  Surgeon: Delight Ovens, MD;  Location: [facility] OR;  Service: Thoracic;  Laterality: N/A;  . CARDIAC CATHETERIZATION    . CORONARY STENT INTERVENTION N/A 04/11/2017   Procedure: CORONARY STENT INTERVENTION;  Surgeon: Corky Crafts, MD;  Location: [facility] INVASIVE CV LAB;  Service: Cardiovascular;  Laterality: N/A;  . CORONARY THROMBECTOMY N/A 04/11/2017   Procedure: Coronary Thrombectomy;  Surgeon: Corky Crafts, MD;  Location: [facility] INVASIVE CV LAB;  Service: Cardiovascular;  Laterality: N/A;  . CORONARY/GRAFT ACUTE MI REVASCULARIZATION N/A 04/11/2017   Procedure: Coronary/Graft Acute MI Revascularization;  Surgeon: Corky Crafts,  MD;  Location: [facility] INVASIVE CV LAB;  Service: Cardiovascular;  Laterality: N/A;  . IABP INSERTION N/A 04/11/2017   Procedure: IABP Insertion;  Surgeon: Corky Crafts, MD;  Location: [facility] INVASIVE CV LAB;  Service: Cardiovascular;  Laterality: N/A;  . LEFT HEART CATH AND CORONARY ANGIOGRAPHY N/A 04/11/2017   Procedure: LEFT HEART CATH AND CORONARY ANGIOGRAPHY;  Surgeon: Corky Crafts, MD;  Location: [facility] INVASIVE CV LAB;  Service: Cardiovascular;  Laterality: N/A;  . PLACEMENT OF IMPELLA LEFT VENTRICULAR ASSIST DEVICE  04/12/2017   Procedure: PLACEMENT OF IMPELLA  LD;  Surgeon: Delight Ovens, MD;  Location: [facility] OR;  Service: Open Heart  Surgery;;  . REMOVAL OF IMPELLA LEFT VENTRICULAR ASSIST DEVICE N/A 04/17/2017   Procedure: REMOVAL OF IMPELLA LEFT VENTRICULAR ASSIST DEVICE;  Surgeon: Delight Ovens, MD;  Location: [facility] OR;  Service: Open Heart Surgery;  Laterality: N/A;  . REPAIR OF RIGHT VENTRICLE LACERATION Left 04/12/2017   Procedure: Resection of LV Aneurysm;  Surgeon: Delight Ovens, MD;  Location: [facility] OR;  Service: Open Heart Surgery;  Laterality: Left;  . STERNAL CLOSURE N/A 04/14/2017   Procedure: STERNAL CLOSURE;  Surgeon: Delight Ovens, MD;  Location: [facility] OR;  Service: Thoracic;  Laterality: N/A;  . TEE WITHOUT CARDIOVERSION N/A 04/14/2017   Procedure: TRANSESOPHAGEAL ECHOCARDIOGRAM (TEE);  Surgeon: Delight Ovens, MD;  Location: [facility] OR;  Service: Thoracic;  Laterality: N/A;  . ULTRASOUND GUIDANCE FOR VASCULAR ACCESS  04/11/2017   Procedure: Ultrasound Guidance For Vascular Access;  Surgeon: Corky Crafts, MD;  Location: [facility] INVASIVE CV LAB;  Service: Cardiovascular;;    Social History   Socioeconomic History  . Marital status: Divorced    Spouse name: Not on file  . Number of children: Not on file  . Years of education: Not on file  . Highest education level: Not on file  Occupational History  . Not on file  Tobacco Use  . Smoking status: Former Smoker    Packs/day: 1.50    Years: 53.00    Pack years: 79.50    Types: Cigarettes    Quit date: 04/11/2017    Years since quitting: 3.3  . Smokeless tobacco: Never Used  Vaping Use  . Vaping Use: Never used  Substance and Sexual Activity  . Alcohol use: Never  . Drug use: Never  . Sexual activity: Not on file  Other Topics Concern  . Not on file  Social History Narrative  . Not on file   Social Determinants of Health   Financial Resource Strain: Not on file  Food Insecurity: Not on file  Transportation Needs: Not on file  Physical Activity: Not on file  Stress: Not on file  Social Connections: Not on file  Intimate Partner  Violence: Not on file    Family History  Problem Relation Age of Onset  . Esophageal cancer Mother       There is no immunization history on file for this patient.  Outpatient Encounter Medications as of 07/29/2020  Medication Sig  . acetaminophen (TYLENOL) 325 MG tablet Take 1-2 tablets (325-650 mg total) by mouth every 4 (four) hours as needed for mild pain.  Marland Kitchen atorvastatin (LIPITOR) 80 MG tablet TAKE 1 TABLET BY MOUTH DAILY AT 6PM  . carvedilol (COREG) 3.125 MG tablet Take 1 tablet (3.125 mg total) by mouth 2 (two) times daily.  . Cholecalciferol (VITAMIN D3) 125 MCG (5000 UT) CAPS Take by mouth.  . clopidogrel (PLAVIX) 75 MG tablet Take 1  tablet (75 mg total) by mouth daily.  . Multiple Vitamins-Minerals (MULTIVITAMIN WITH MINERALS) tablet Take 1 tablet by mouth daily.  . sacubitril-valsartan (ENTRESTO) 24-26 MG Take 1 tablet by mouth 2 (two) times daily.  Marland Kitchen aspirin 81 MG chewable tablet Chew 81 mg by mouth daily. (Patient not taking: Reported on 07/29/2020)  . cyclobenzaprine (FLEXERIL) 5 MG tablet Take 1 tablet (5 mg total) by mouth 3 (three) times daily as needed for muscle spasms. (Patient not taking: Reported on 07/29/2020)   No facility-administered encounter medications on file as of 07/29/2020.     ROS: Pertinent positives and negatives noted in HPI. Remainder of ROS non-contributory    Allergies  Allergen Reactions  . Other Other (See Comments)    Potatoes- Blurry eyes, eyes hurt, and patient passes out (thinks he may bed sensitive)  . Tomato Other (See Comments)    Potatoes- Blurry eyes, eyes hurt, and patient passes out (thinks he may bed sensitive)    BP 128/76   Pulse 80   Temp 97.6 F (36.4 C) (Temporal)   Ht 5\' 7"  (1.702 m)   Wt 226 lb (102.5 kg)   SpO2 96%   BMI 35.40 kg/m   Wt Readings from Last 3 Encounters:  07/29/20 226 lb (102.5 kg)  01/07/20 210 lb 6.4 oz (95.4 kg)  09/13/19 200 lb (90.7 kg)   Temp Readings from Last 3 Encounters:  07/29/20  97.6 F (36.4 C) (Temporal)  09/13/19 98.5 F (36.9 C) (Oral)  03/06/19 97.8 F (36.6 C) (Temporal)   BP Readings from Last 3 Encounters:  07/29/20 128/76  01/07/20 118/64  09/13/19 122/77   Pulse Readings from Last 3 Encounters:  07/29/20 80  01/07/20 67  09/13/19 90     Physical Exam Constitutional:      General: He is not in acute distress.    Appearance: Normal appearance. He is not ill-appearing.  Musculoskeletal:        General: Normal range of motion.     Right hand: No swelling, deformity or tenderness. Normal range of motion. Decreased sensation. Normal pulse.     Right lower leg: No edema.     Left lower leg: No edema.  Skin:    General: Skin is warm.  Neurological:     Mental Status: He is alert.      A/P:   1. Right hand pain 2. Numbness and tingling in right hand - Ambulatory referral to Orthopedic Surgery  3. Cramping of hands 4. Cramping of feet - Basic metabolic panel - Magnesium    This visit occurred during the SARS-CoV-2 public health emergency.  Safety protocols were in place, including screening questions prior to the visit, additional usage of staff PPE, and extensive cleaning of exam room while observing appropriate contact time as indicated for disinfecting solutions.

## 2020-07-30 ENCOUNTER — Telehealth: Payer: Self-pay

## 2020-07-30 MED ORDER — CYCLOBENZAPRINE HCL 5 MG PO TABS: 5.0000 mg | ORAL_TABLET | Freq: Two times a day (BID) | ORAL | 0 refills | Status: DC | PRN

## 2020-07-30 NOTE — Telephone Encounter (Signed)
Pt had a visit yesterday with Dr. Salena Saner and was prescribed Cyclobenzaprine 5mg .  Pt called pharmacy and they informed him that it was not there.  Please advise. Thank you.

## 2020-07-30 NOTE — Telephone Encounter (Signed)
I did not Rx flexeril at yesterdays appt. Pt mentioned having Rx for that in the past for cramping/spasms but did not request refill. Will send now to pts pharm

## 2020-08-10 ENCOUNTER — Other Ambulatory Visit: Payer: Self-pay | Admitting: Family Medicine

## 2020-09-17 ENCOUNTER — Telehealth: Payer: Self-pay | Admitting: Family Medicine

## 2020-09-17 NOTE — Telephone Encounter (Signed)
Left message for patient to schedule Annual Wellness Visit.  Please schedule with Nurse Health Advisor Martha Stanley, RN at [facility]  °

## 2020-09-17 NOTE — Telephone Encounter (Signed)
Left message for patient to schedule Annual Wellness Visit.  Please schedule with Nurse Health Advisor Martha Stanley, RN at [facility]  °

## 2020-09-30 ENCOUNTER — Other Ambulatory Visit: Payer: Self-pay | Admitting: Family Medicine

## 2020-10-30 ENCOUNTER — Telehealth: Payer: Self-pay | Admitting: Family Medicine

## 2020-10-30 NOTE — Telephone Encounter (Signed)
Left message for patient to call back and schedule Medicare Annual Wellness Visit (AWV).   Please offer to do virtually or by telephone.   Last AWV:03/06/2019   Please schedule at anytime with Nurse Health Advisor.

## 2020-12-04 ENCOUNTER — Telehealth: Payer: Self-pay | Admitting: Interventional Cardiology

## 2020-12-04 ENCOUNTER — Telehealth: Payer: Self-pay | Admitting: Family Medicine

## 2020-12-04 MED ORDER — CLOPIDOGREL BISULFATE 75 MG PO TABS: 75.0000 mg | ORAL_TABLET | Freq: Every day | ORAL | 2 refills | Status: DC

## 2020-12-04 NOTE — Telephone Encounter (Signed)
*  STAT* If patient is at the pharmacy, call can be transferred to refill team.   1. Which medications need to be refilled? (please list name of each medication and dose if known) clopidogrel (PLAVIX) 75 MG tablet  2. Which pharmacy/location (including street and city if local pharmacy) is medication to be sent to? [id number] - , [place] - [address]   3. Do they need a 30 day or 90 day supply? 90DS  PT states that he misplaced his medication and has been looking for it for almost 5days, pt is needing it replaced.. please advise

## 2020-12-04 NOTE — Telephone Encounter (Signed)
Pt is wanting a refill on his clopidogrel (PLAVIX) 75 MG tablet [[id number]]. I informed  him that Corky Crafts, MD fills this script. He understood and he said he would call this provider to get a refill on it. He still wants you to fill it if you can.   Pharmacy  [id number] [place], [place] - [address] AT [facility] [address]  [address],  [place] [address]  Phone:  [phone]  Fax:  [phone]  DEA #:  [id number]

## 2020-12-04 NOTE — Telephone Encounter (Signed)
Corky Crafts, MD, has sent this medication in today.

## 2020-12-11 ENCOUNTER — Telehealth: Payer: Self-pay | Admitting: Family Medicine

## 2020-12-11 NOTE — Telephone Encounter (Signed)
Pt called and wanted to know if he can get penicillin, Pt requesting call back from nurse today. Please advise

## 2021-01-30 ENCOUNTER — Encounter: Payer: Self-pay | Admitting: Family Medicine

## 2021-01-30 ENCOUNTER — Ambulatory Visit (INDEPENDENT_AMBULATORY_CARE_PROVIDER_SITE_OTHER): Payer: Medicare Other

## 2021-01-30 DIAGNOSIS — Z0000 Encounter for general adult medical examination without abnormal findings: Secondary | ICD-10-CM | POA: Diagnosis not present

## 2021-01-30 NOTE — Progress Notes (Signed)
Patient wasn't happy at all about having to be asked questions that to him made no sense. I asked if he wanted to schedule for next year and he said he didn't know if he would be alive then.

## 2021-01-31 ENCOUNTER — Encounter: Payer: Self-pay | Admitting: Family Medicine

## 2021-01-31 NOTE — Progress Notes (Addendum)
Subjective:   [name] is a 70 y.o. male who presents for Medicare Annual/Subsequent preventive examination.  I connected with  [name] on 01/30/21 by a audio enabled telemedicine application and verified that I am speaking with the correct person using two identifiers.   I discussed the limitations of evaluation and management by telemedicine. The patient expressed understanding and agreed to proceed.  Patient location is home. Provider location is office. Provider and patient were the only two participants.   Review of Systems    Cardiac Risk Factors include: advanced age (>35men, >46 women);dyslipidemia;hypertension;male genderDeferred to PCP.     Objective:    There were no vitals filed for this visit. There is no height or weight on file to calculate BMI.  Advanced Directives 01/31/2021 03/06/2019 11/20/2017 09/27/2017 04/27/2017 04/18/2017  Does Patient Have a Medical Advance Directive? No No No No No No  Would patient like information on creating a medical advance directive? No - Patient declined No - Patient declined Yes (MAU/Ambulatory/Procedural Areas - Information given) No - Patient declined No - Patient declined No - Patient declined    Current Medications (verified) Outpatient Encounter Medications as of 01/30/2021  Medication Sig   acetaminophen (TYLENOL) 325 MG tablet Take 1-2 tablets (325-650 mg total) by mouth every 4 (four) hours as needed for mild pain.   aspirin 81 MG chewable tablet Chew 81 mg by mouth daily. (Patient not taking: Reported on 07/29/2020)   atorvastatin (LIPITOR) 80 MG tablet TAKE 1 TABLET BY MOUTH DAILY AT 6PM   carvedilol (COREG) 3.125 MG tablet TAKE 1 TABLET BY MOUTH TWICE A DAY WITH FOOD   Cholecalciferol (VITAMIN D3) 125 MCG (5000 UT) CAPS Take by mouth.   clopidogrel (PLAVIX) 75 MG tablet Take 1 tablet (75 mg total) by mouth daily.   cyclobenzaprine (FLEXERIL) 5 MG tablet TAKE 1 TABLET BY MOUTH 2 TIMES DAILY AS NEEDED FOR MUSCLE SPASMS.    Multiple Vitamins-Minerals (MULTIVITAMIN WITH MINERALS) tablet Take 1 tablet by mouth daily.   sacubitril-valsartan (ENTRESTO) 24-26 MG Take 1 tablet by mouth 2 (two) times daily.   No facility-administered encounter medications on file as of 01/30/2021.    Allergies (verified) Other and Tomato   History: Past Medical History:  Diagnosis Date   ACS (acute coronary syndrome) (HCC)    Acute anterior wall MI (HCC)    Acute blood loss anemia    Acute heart failure (HCC)    Acute hypoxemic respiratory failure (HCC)    Acute pulmonary edema (HCC)    Acute respiratory failure with hypoxia (HCC)    Aneurysm of left ventricle of heart 04/12/2017   Anoxic brain injury (HCC)    Bacteremia    Benign essential HTN    CAD in native artery    Cardiogenic shock (HCC)    CHF (congestive heart failure), NYHA class II, acute, combined (HCC) 04/11/2017   Chronic neck pain    Chronic systolic (congestive) heart failure (HCC)    Confusion    Congestive heart failure (CHF) (HCC)    COPD (chronic obstructive pulmonary disease) (HCC) 04/11/2017   Coronary artery disease    Dysphagia    Encounter for central line placement    Encounter for management of intra-aortic balloon pump    Essential hypertension    Fever    Hyperglycemia    Hypertension    Hypokalemia    Hypotension due to drugs    Hypoxemia    Labile blood pressure    Leukocytosis  Physical debility 04/27/2017   Rash 05/15/2017   Sepsis (HCC)    Sore throat    STEMI (ST elevation myocardial infarction) (HCC) 04/11/2017   Supplemental oxygen dependent    Tachypnea    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 [facility])    Past Surgical History:  Procedure Laterality Date   APPLICATION OF WOUND VAC N/A 04/14/2017   Procedure: APPLICATION OF WOUND VAC with reposition of Impella device;  Surgeon: Delight Ovens, MD;  Location: [facility] OR;  Service: Thoracic;  Laterality: N/A;   CARDIAC CATHETERIZATION      CORONARY STENT INTERVENTION N/A 04/11/2017   Procedure: CORONARY STENT INTERVENTION;  Surgeon: Corky Crafts, MD;  Location: [facility] INVASIVE CV LAB;  Service: Cardiovascular;  Laterality: N/A;   CORONARY THROMBECTOMY N/A 04/11/2017   Procedure: Coronary Thrombectomy;  Surgeon: Corky Crafts, MD;  Location: [facility] INVASIVE CV LAB;  Service: Cardiovascular;  Laterality: N/A;   CORONARY/GRAFT ACUTE MI REVASCULARIZATION N/A 04/11/2017   Procedure: Coronary/Graft Acute MI Revascularization;  Surgeon: Corky Crafts, MD;  Location: [facility] INVASIVE CV LAB;  Service: Cardiovascular;  Laterality: N/A;   IABP INSERTION N/A 04/11/2017   Procedure: IABP Insertion;  Surgeon: Corky Crafts, MD;  Location: [facility] INVASIVE CV LAB;  Service: Cardiovascular;  Laterality: N/A;   LEFT HEART CATH AND CORONARY ANGIOGRAPHY N/A 04/11/2017   Procedure: LEFT HEART CATH AND CORONARY ANGIOGRAPHY;  Surgeon: Corky Crafts, MD;  Location: [facility] INVASIVE CV LAB;  Service: Cardiovascular;  Laterality: N/A;   PLACEMENT OF IMPELLA LEFT VENTRICULAR ASSIST DEVICE  04/12/2017   Procedure: PLACEMENT OF IMPELLA  LD;  Surgeon: Delight Ovens, MD;  Location: [facility] OR;  Service: Open Heart Surgery;;   REMOVAL OF IMPELLA LEFT VENTRICULAR ASSIST DEVICE N/A 04/17/2017   Procedure: REMOVAL OF IMPELLA LEFT VENTRICULAR ASSIST DEVICE;  Surgeon: Delight Ovens, MD;  Location: [facility] OR;  Service: Open Heart Surgery;  Laterality: N/A;   REPAIR OF RIGHT VENTRICLE LACERATION Left 04/12/2017   Procedure: Resection of LV Aneurysm;  Surgeon: Delight Ovens, MD;  Location: [facility] OR;  Service: Open Heart Surgery;  Laterality: Left;   STERNAL CLOSURE N/A 04/14/2017   Procedure: STERNAL CLOSURE;  Surgeon: Delight Ovens, MD;  Location: [facility] OR;  Service: Thoracic;  Laterality: N/A;   TEE WITHOUT CARDIOVERSION N/A 04/14/2017   Procedure: TRANSESOPHAGEAL ECHOCARDIOGRAM (TEE);  Surgeon: Delight Ovens, MD;  Location: [facility] OR;  Service:  Thoracic;  Laterality: N/A;   ULTRASOUND GUIDANCE FOR VASCULAR ACCESS  04/11/2017   Procedure: Ultrasound Guidance For Vascular Access;  Surgeon: Corky Crafts, MD;  Location: [facility] INVASIVE CV LAB;  Service: Cardiovascular;;   Family History  Problem Relation Age of Onset   Esophageal cancer Mother    Social History   Socioeconomic History   Marital status: Divorced    Spouse name: Not on file   Number of children: Not on file   Years of education: 14   Highest education level: Associate degree: occupational, Scientist, product/process development, or vocational program  Occupational History   Occupation: Retired  Tobacco Use   Smoking status: Former    Packs/day: 1.50    Years: 53.00    Pack years: 79.50    Types: Cigarettes    Quit date: 04/11/2017    Years since quitting: 3.8   Smokeless tobacco: Never  Vaping Use   Vaping Use: Never used  Substance and Sexual Activity   Alcohol use: Never   Drug use: Never   Sexual activity:  Not Currently  Other Topics Concern   Not on file  Social History Narrative   Not on file   Social Determinants of Health   Financial Resource Strain: Low Risk    Difficulty of Paying Living Expenses: Not hard at all  Food Insecurity: Unknown   Worried About Programme researcher, broadcasting/film/video in the Last Year: Patient refused   Barista in the Last Year: Patient refused  Transportation Needs: No Transportation Needs   Freight forwarder (Medical): No   Lack of Transportation (Non-Medical): No  Physical Activity: Unknown   Days of Exercise per Week: Patient refused   Minutes of Exercise per Session: Patient refused  Stress: No Stress Concern Present   Feeling of Stress : Not at all  Social Connections: Moderately Isolated   Frequency of Communication with Friends and Family: More than three times a week   Frequency of Social Gatherings with Friends and Family: More than three times a week   Attends Religious Services: More than 4 times per year   Active Member of  [employer] or Organizations: No   Attends Engineer, structural: Never   Marital Status: Divorced    Tobacco Counseling Counseling given: No   Clinical Intake:  Pre-visit preparation completed: No  Pain : No/denies pain     Nutritional Risks: None Diabetes: No  How often do you need to have someone help you when you read instructions, pamphlets, or other written materials from your doctor or pharmacy?: 1 - Never What is the last grade level you completed in school?: Associate Degree  Diabetic? No  Interpreter Needed?: No  Information entered by :: Jodene Nam CCMA   Activities of Daily Living In your present state of health, do you have any difficulty performing the following activities: 01/30/2021 01/30/2021  Hearing? N N  Vision? N N  Difficulty concentrating or making decisions? N N  Walking or climbing stairs? N N  Dressing or bathing? N N  Doing errands, shopping? N N  Preparing Food and eating ? - N  Using the Toilet? - N  In the past six months, have you accidently leaked urine? - N  Do you have problems with loss of bowel control? - N  Managing your Medications? - N  Managing your Finances? - N  Housekeeping or managing your Housekeeping? - N  Some recent data might be hidden    Patient Care Team: Overton Mam, DO as PCP - General (Family Medicine) Corky Crafts, MD as PCP - Cardiology (Cardiology)  Indicate any recent Medical Services you may have received from other than [facility] providers in the past year (date may be approximate).     Assessment:   This is a routine wellness examination for [name].  Hearing/Vision screen No results found.  Dietary issues and exercise activities discussed: Current Exercise Habits: The patient does not participate in regular exercise at present, Exercise limited by: cardiac condition(s);orthopedic condition(s)   Goals Addressed   None   Depression Screen PHQ 2/9 Scores 01/30/2021 01/30/2021 07/29/2020  03/06/2019 03/06/2019 11/20/2017 10/30/2017  PHQ - 2 Score 0 0 0 0 0 1 0  PHQ- 9 Score 1 - - - - - -    Fall Risk Fall Risk  01/30/2021 07/29/2020 03/06/2019 03/06/2019 11/20/2017  Falls in the past year? 0 0 0 0 No  Number falls in past yr: 0 0 0 0 -  Injury with Fall? 0 0 0 0 -  Risk for fall  due to : No Fall Risks - - - -  Follow up Falls evaluation completed - - - -    FALL RISK PREVENTION PERTAINING TO THE HOME:  Any stairs in or around the home? Yes  If so, are there any without handrails? No  Home free of loose throw rugs in walkways, pet beds, electrical cords, etc? No  Adequate lighting in your home to reduce risk of falls? Yes   ASSISTIVE DEVICES UTILIZED TO PREVENT FALLS:  Life alert?  Did not ask Use of a cane, walker or w/c? Yes  when needed Grab bars in the bathroom? Yes  Shower chair or bench in shower? No  Elevated toilet seat or a handicapped toilet? No   TIMED UP AND GO: Was not performed  Cognitive Function:     6CIT Screen 01/30/2021  What Year? 0 points  What month? 0 points  What time? 0 points  Count back from 20 0 points  Months in reverse 0 points  Repeat phrase 0 points  Total Score 0    Immunizations  There is no immunization history on file for this patient.  TDAP status: Due, Education has been provided regarding the importance of this vaccine. Advised may receive this vaccine at local pharmacy or Health Dept. Aware to provide a copy of the vaccination record if obtained from local pharmacy or Health Dept. Verbalized acceptance and understanding.  Flu Vaccine status: Due, Education has been provided regarding the importance of this vaccine. Advised may receive this vaccine at local pharmacy or Health Dept. Aware to provide a copy of the vaccination record if obtained from local pharmacy or Health Dept. Verbalized acceptance and understanding.  Pneumococcal vaccine status: Due, Education has been provided regarding the importance of this vaccine.  Advised may receive this vaccine at local pharmacy or Health Dept. Aware to provide a copy of the vaccination record if obtained from local pharmacy or Health Dept. Verbalized acceptance and understanding.  Covid-19 vaccine status: Information provided on how to obtain vaccines.   Qualifies for Shingles Vaccine? Yes   Zostavax completed No   Shingrix Completed?: No.    Education has been provided regarding the importance of this vaccine. Patient has been advised to call insurance company to determine out of pocket expense if they have not yet received this vaccine. Advised may also receive vaccine at local pharmacy or Health Dept. Verbalized acceptance and understanding.  Screening Tests Health Maintenance  Topic Date Due   COVID-19 Vaccine (1) Never done   TETANUS/TDAP  Never done   COLONOSCOPY (Pts 45-2348yrs Insurance coverage will need to be confirmed)  Never done   Zoster Vaccines- Shingrix (1 of 2) Never done   PNA vac Low Risk Adult (1 of 2 - PCV13) Never done   Hepatitis C Screening  Completed   HPV VACCINES  Aged Out   INFLUENZA VACCINE  Discontinued    Health Maintenance  Health Maintenance Due  Topic Date Due   COVID-19 Vaccine (1) Never done   TETANUS/TDAP  Never done   COLONOSCOPY (Pts 45-6048yrs Insurance coverage will need to be confirmed)  Never done   Zoster Vaccines- Shingrix (1 of 2) Never done   PNA vac Low Risk Adult (1 of 2 - PCV13) Never done    Patient declined Colonoscopy  Lung Cancer Screening: (Low Dose CT Chest recommended if Age 69-80 years, 30 pack-year currently smoking OR have quit w/in 15years.) does qualify.   Lung Cancer Screening Referral: No  Additional Screening:  Hepatitis C Screening: does qualify; Completed 10/05/2017  Vision Screening: Recommended annual ophthalmology exams for early detection of glaucoma and other disorders of the eye. Is the patient up to date with their annual eye exam?   Did not ask Who is the provider or what is  the name of the office in which the patient attends annual eye exams? Did not ask If pt is not established with a provider, would they like to be referred to a provider to establish care?  N/A .   Dental Screening: Recommended annual dental exams for proper oral hygiene  Community Resource Referral / Chronic Care Management: CRR required this visit?  No   CCM required this visit?  No      Plan:     I have personally reviewed and noted the following in the patient's chart:   Medical and social history Use of alcohol, tobacco or illicit drugs  Current medications and supplements including opioid prescriptions. Patient is not currently taking opioid prescriptions. Functional ability and status Nutritional status Physical activity Advanced directives List of other physicians Hospitalizations, surgeries, and ER visits in previous 12 months Vitals Screenings to include cognitive, depression, and falls Referrals and appointments  In addition, I have reviewed and discussed with patient certain preventive protocols, quality metrics, and best practice recommendations. A written personalized care plan for preventive services as well as general preventive health recommendations were provided to patient.     Rickard Rhymes, CMA   01/31/2021   Nurse Notes: Non face to face visit.   [name] , Thank you for taking time to come for your Medicare Wellness Visit. I appreciate your ongoing commitment to your health goals. Please review the following plan we discussed and let me know if I can assist you in the future.   These are the goals we discussed:  Goals      DIET - INCREASE WATER INTAKE        This is a list of the screening recommended for you and due dates:  Health Maintenance  Topic Date Due   COVID-19 Vaccine (1) Never done   Tetanus Vaccine  Never done   Colon Cancer Screening  Never done   Zoster (Shingles) Vaccine (1 of 2) Never done   Pneumonia vaccines (1 of 2 -  PCV13) Never done   Hepatitis C Screening: USPSTF Recommendation to screen - Ages 69-79 yo.  Completed   HPV Vaccine  Aged Out   Flu Shot  Discontinued     Reviewed.

## 2021-02-06 ENCOUNTER — Telehealth: Payer: Self-pay

## 2021-02-06 NOTE — Telephone Encounter (Signed)
Pt has had AWV this month done by Jodene Nam on 01/30/2021.

## 2021-02-18 ENCOUNTER — Encounter: Payer: Self-pay | Admitting: Family Medicine

## 2021-02-18 ENCOUNTER — Other Ambulatory Visit: Payer: Self-pay

## 2021-02-18 ENCOUNTER — Ambulatory Visit (INDEPENDENT_AMBULATORY_CARE_PROVIDER_SITE_OTHER): Payer: Medicare Other | Admitting: Family Medicine

## 2021-02-18 VITALS — BP 106/64 | HR 82 | Temp 97.0°F | Ht 67.0 in | Wt 234.0 lb

## 2021-02-18 DIAGNOSIS — D509 Iron deficiency anemia, unspecified: Secondary | ICD-10-CM

## 2021-02-18 DIAGNOSIS — I2510 Atherosclerotic heart disease of native coronary artery without angina pectoris: Secondary | ICD-10-CM | POA: Diagnosis not present

## 2021-02-18 DIAGNOSIS — Z87891 Personal history of nicotine dependence: Secondary | ICD-10-CM

## 2021-02-18 DIAGNOSIS — R252 Cramp and spasm: Secondary | ICD-10-CM

## 2021-02-18 DIAGNOSIS — I5022 Chronic systolic (congestive) heart failure: Secondary | ICD-10-CM

## 2021-02-18 HISTORY — DX: Personal history of nicotine dependence: Z87.891

## 2021-02-18 HISTORY — DX: Cramp and spasm: R25.2

## 2021-02-18 LAB — URINALYSIS, ROUTINE W REFLEX MICROSCOPIC
Bilirubin Urine: NEGATIVE
Hgb urine dipstick: NEGATIVE
Ketones, ur: NEGATIVE
Leukocytes,Ua: NEGATIVE
Nitrite: NEGATIVE
RBC / HPF: NONE SEEN (ref 0–?)
Specific Gravity, Urine: 1.025 (ref 1.000–1.030)
Total Protein, Urine: NEGATIVE
Urine Glucose: NEGATIVE
Urobilinogen, UA: 0.2 (ref 0.0–1.0)
pH: 5.5 (ref 5.0–8.0)

## 2021-02-18 LAB — CBC
HCT: 38.1 % — ABNORMAL LOW (ref 39.0–52.0)
Hemoglobin: 12.2 g/dL — ABNORMAL LOW (ref 13.0–17.0)
MCHC: 32 g/dL (ref 30.0–36.0)
MCV: 83.5 fl (ref 78.0–100.0)
Platelets: 216 10*3/uL (ref 150.0–400.0)
RBC: 4.57 Mil/uL (ref 4.22–5.81)
RDW: 16.2 % — ABNORMAL HIGH (ref 11.5–15.5)
WBC: 9.4 10*3/uL (ref 4.0–10.5)

## 2021-02-18 LAB — BASIC METABOLIC PANEL
BUN: 20 mg/dL (ref 6–23)
CO2: 28 mEq/L (ref 19–32)
Calcium: 9.2 mg/dL (ref 8.4–10.5)
Chloride: 106 mEq/L (ref 96–112)
Creatinine, Ser: 1.24 mg/dL (ref 0.40–1.50)
GFR: 58.09 mL/min — ABNORMAL LOW (ref >60.00)
Glucose, Bld: 118 mg/dL — ABNORMAL HIGH (ref 70–99)
Potassium: 4.1 mEq/L (ref 3.5–5.1)
Sodium: 141 mEq/L (ref 135–145)

## 2021-02-18 LAB — MAGNESIUM: Magnesium: 1.8 mg/dL (ref 1.5–2.5)

## 2021-02-18 NOTE — Progress Notes (Addendum)
Established Patient Office Visit  Subjective:  Patient ID: [name], male    DOB: 05/16/1951  Age: 70 y.o. MRN: [id number]  CC:  Chief Complaint  Patient presents with   Hand Pain    C/O cramping hands when driving also back of legs cramping at times.     HPI [name] presents for follow-up of muscle spasms he has been experiencing in his hands.  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  They are relieved when he stretches his hands.  History of microcytic anemia.  He is taking clopidogrel and an 81 mg aspirin status post coronary artery stenting.  Denies hematuria.  He did smoke for many years.  Urine flow is not like it was when he was 18 but otherwise okay.  Experiences nocturia only when he consumes excessive fluids before bedtime.  He has seen a small amount of blood in his stool 3 times over the last year.  He denies melena.  He is taking 325 mg of iron sulfate daily without issue.  History of coronary artery disease status post stenting with CHF.  Patient denies shortness of breath or chest pain.  He is sleeping on 1 pillow.  Past Medical History:  Diagnosis Date   ACS (acute coronary syndrome) (HCC)    Acute anterior wall MI (HCC)    Acute blood loss anemia    Acute heart failure (HCC)    Acute hypoxemic respiratory failure (HCC)    Acute pulmonary edema (HCC)    Acute respiratory failure with hypoxia (HCC)    Aneurysm of left ventricle of heart 04/12/2017   Anoxic brain injury (HCC)    Bacteremia    Benign essential HTN    CAD in native artery    Cardiogenic shock (HCC)    CHF (congestive heart failure), NYHA class II, acute, combined (HCC) 04/11/2017   Chronic neck pain    Chronic systolic (congestive) heart failure (HCC)    Confusion    Congestive heart failure (CHF) (HCC)    COPD (chronic obstructive pulmonary disease) (HCC) 04/11/2017   Coronary artery disease    Dysphagia    Encounter for central  line placement    Encounter for management of intra-aortic balloon pump    Essential hypertension    Fever    Hyperglycemia    Hypertension    Hypokalemia    Hypotension due to drugs    Hypoxemia    Labile blood pressure    Leukocytosis    Physical debility 04/27/2017   Rash 05/15/2017   Sepsis (HCC)    Sore throat    STEMI (ST elevation myocardial infarction) (HCC) 04/11/2017   Supplemental oxygen dependent    Tachypnea    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 [facility])     Past Surgical History:  Procedure Laterality Date   APPLICATION OF WOUND VAC N/A 04/14/2017   Procedure: APPLICATION OF WOUND VAC with reposition of Impella device;  Surgeon: Delight Ovens, MD;  Location: [facility] OR;  Service: Thoracic;  Laterality: N/A;   CARDIAC CATHETERIZATION     CORONARY STENT INTERVENTION N/A 04/11/2017   Procedure: CORONARY STENT INTERVENTION;  Surgeon: Corky Crafts, MD;  Location: [facility] INVASIVE CV LAB;  Service: Cardiovascular;  Laterality: N/A;   CORONARY THROMBECTOMY N/A 04/11/2017   Procedure: Coronary Thrombectomy;  Surgeon: Corky Crafts, MD;  Location: [facility] INVASIVE CV LAB;  Service: Cardiovascular;  Laterality: N/A;  CORONARY/GRAFT ACUTE MI REVASCULARIZATION N/A 04/11/2017   Procedure: Coronary/Graft Acute MI Revascularization;  Surgeon: Corky Crafts, MD;  Location: [facility] INVASIVE CV LAB;  Service: Cardiovascular;  Laterality: N/A;   IABP INSERTION N/A 04/11/2017   Procedure: IABP Insertion;  Surgeon: Corky Crafts, MD;  Location: [facility] INVASIVE CV LAB;  Service: Cardiovascular;  Laterality: N/A;   LEFT HEART CATH AND CORONARY ANGIOGRAPHY N/A 04/11/2017   Procedure: LEFT HEART CATH AND CORONARY ANGIOGRAPHY;  Surgeon: Corky Crafts, MD;  Location: [facility] INVASIVE CV LAB;  Service: Cardiovascular;  Laterality: N/A;   PLACEMENT OF IMPELLA LEFT VENTRICULAR ASSIST DEVICE  04/12/2017   Procedure: PLACEMENT OF IMPELLA  LD;  Surgeon:  Delight Ovens, MD;  Location: [facility] OR;  Service: Open Heart Surgery;;   REMOVAL OF IMPELLA LEFT VENTRICULAR ASSIST DEVICE N/A 04/17/2017   Procedure: REMOVAL OF IMPELLA LEFT VENTRICULAR ASSIST DEVICE;  Surgeon: Delight Ovens, MD;  Location: [facility] OR;  Service: Open Heart Surgery;  Laterality: N/A;   REPAIR OF RIGHT VENTRICLE LACERATION Left 04/12/2017   Procedure: Resection of LV Aneurysm;  Surgeon: Delight Ovens, MD;  Location: [facility] OR;  Service: Open Heart Surgery;  Laterality: Left;   STERNAL CLOSURE N/A 04/14/2017   Procedure: STERNAL CLOSURE;  Surgeon: Delight Ovens, MD;  Location: [facility] OR;  Service: Thoracic;  Laterality: N/A;   TEE WITHOUT CARDIOVERSION N/A 04/14/2017   Procedure: TRANSESOPHAGEAL ECHOCARDIOGRAM (TEE);  Surgeon: Delight Ovens, MD;  Location: [facility] OR;  Service: Thoracic;  Laterality: N/A;   ULTRASOUND GUIDANCE FOR VASCULAR ACCESS  04/11/2017   Procedure: Ultrasound Guidance For Vascular Access;  Surgeon: Corky Crafts, MD;  Location: [facility] INVASIVE CV LAB;  Service: Cardiovascular;;    Family History  Problem Relation Age of Onset   Esophageal cancer Mother     Social History   Socioeconomic History   Marital status: Divorced    Spouse name: Not on file   Number of children: Not on file   Years of education: 14   Highest education level: Associate degree: occupational, Scientist, product/process development, or vocational program  Occupational History   Occupation: Retired  Tobacco Use   Smoking status: Former    Packs/day: 1.50    Years: 53.00    Pack years: 79.50    Types: Cigarettes    Quit date: 04/11/2017    Years since quitting: 3.8   Smokeless tobacco: Never  Vaping Use   Vaping Use: Never used  Substance and Sexual Activity   Alcohol use: Never   Drug use: Never   Sexual activity: Not Currently  Other Topics Concern   Not on file  Social History Narrative   Not on file   Social Determinants of Health   Financial Resource Strain: Low Risk     Difficulty of Paying Living Expenses: Not hard at all  Food Insecurity: Unknown   Worried About Programme researcher, broadcasting/film/video in the Last Year: Patient refused   Barista in the Last Year: Patient refused  Transportation Needs: No Transportation Needs   Lack of Transportation (Medical): No   Lack of Transportation (Non-Medical): No  Physical Activity: Unknown   Days of Exercise per Week: Patient refused   Minutes of Exercise per Session: Patient refused  Stress: No Stress Concern Present   Feeling of Stress : Not at all  Social Connections: Moderately Isolated   Frequency of Communication with Friends and Family: More than three times a week   Frequency of Social Gatherings with  Friends and Family: More than three times a week   Attends Religious Services: More than 4 times per year   Active Member of Clubs or Organizations: No   Attends Banker Meetings: Never   Marital Status: Divorced  Catering manager Violence: Not At Risk   Fear of Current or Ex-Partner: No   Emotionally Abused: No   Physically Abused: No   Sexually Abused: No    Outpatient Medications Prior to Visit  Medication Sig Dispense Refill   aspirin 81 MG chewable tablet Chew 81 mg by mouth daily.     atorvastatin (LIPITOR) 80 MG tablet TAKE 1 TABLET BY MOUTH DAILY AT 6PM 90 tablet 3   carvedilol (COREG) 3.125 MG tablet TAKE 1 TABLET BY MOUTH TWICE A DAY WITH FOOD 180 tablet 3   Cholecalciferol (VITAMIN D3) 125 MCG (5000 UT) CAPS Take by mouth.     clopidogrel (PLAVIX) 75 MG tablet Take 1 tablet (75 mg total) by mouth daily. 90 tablet 2   cyclobenzaprine (FLEXERIL) 5 MG tablet TAKE 1 TABLET BY MOUTH 2 TIMES DAILY AS NEEDED FOR MUSCLE SPASMS. 60 tablet 0   Multiple Vitamins-Minerals (MULTIVITAMIN WITH MINERALS) tablet Take 1 tablet by mouth daily.     sacubitril-valsartan (ENTRESTO) 24-26 MG Take 1 tablet by mouth 2 (two) times daily. 180 tablet 3   acetaminophen (TYLENOL) 325 MG tablet Take 1-2 tablets  (325-650 mg total) by mouth every 4 (four) hours as needed for mild pain.     No facility-administered medications prior to visit.    Allergies  Allergen Reactions   Other Other (See Comments)    Potatoes- Blurry eyes, eyes hurt, and patient passes out (thinks he may bed sensitive)   Tomato Other (See Comments)    Potatoes- Blurry eyes, eyes hurt, and patient passes out (thinks he may bed sensitive)    ROS Review of Systems  Constitutional:  Negative for diaphoresis, fatigue, fever and unexpected weight change.  HENT: Negative.    Respiratory:  Negative for shortness of breath and wheezing.   Cardiovascular:  Negative for chest pain and palpitations.  Gastrointestinal:  Negative for abdominal pain, anal bleeding, constipation and diarrhea.  Genitourinary:  Negative for difficulty urinating, frequency, hematuria and urgency.  Musculoskeletal:  Positive for myalgias.  Neurological:  Negative for speech difficulty, weakness, light-headedness and headaches.  Psychiatric/Behavioral: Negative.       Objective:    Physical Exam Vitals and nursing note reviewed.  Constitutional:      General: He is not in acute distress.    Appearance: Normal appearance. He is not ill-appearing, toxic-appearing or diaphoretic.  HENT:     Head: Normocephalic and atraumatic.     Right Ear: Tympanic membrane, ear canal and external ear normal.     Left Ear: Tympanic membrane, ear canal and external ear normal.     Mouth/Throat:     Mouth: Mucous membranes are moist.     Pharynx: Oropharynx is clear. No oropharyngeal exudate or posterior oropharyngeal erythema.  Eyes:     General: No scleral icterus.       Right eye: No discharge.        Left eye: No discharge.     Extraocular Movements: Extraocular movements intact.     Conjunctiva/sclera: Conjunctivae normal.     Pupils: Pupils are equal, round, and reactive to light.  Neck:     Vascular: No carotid bruit.  Cardiovascular:     Rate and Rhythm:  Normal rate and regular rhythm.  Pulmonary:     Effort: Pulmonary effort is normal. No respiratory distress.     Breath sounds: Normal breath sounds. No stridor. No wheezing, rhonchi or rales.  Abdominal:     General: Bowel sounds are normal.  Genitourinary:    Comments: Refuses exam  Musculoskeletal:     Cervical back: No rigidity or tenderness.  Lymphadenopathy:     Cervical: No cervical adenopathy.  Skin:    General: Skin is warm and dry.  Neurological:     Mental Status: He is alert and oriented to person, place, and time.  Psychiatric:        Mood and Affect: Mood normal.        Behavior: Behavior normal.    BP 106/64 (BP Location: Left Arm, Patient Position: Sitting, Cuff Size: Large)   Pulse 82   Temp (!) 97 F (36.1 C) (Temporal)   Ht 5\' 7"  (1.702 m)   Wt 234 lb (106.1 kg)   SpO2 94%   BMI 36.65 kg/m  Wt Readings from Last 3 Encounters:  02/18/21 234 lb (106.1 kg)  07/29/20 226 lb (102.5 kg)  01/07/20 210 lb 6.4 oz (95.4 kg)     Health Maintenance Due  Topic Date Due   TETANUS/TDAP  Never done   COLONOSCOPY (Pts 45-54yrs Insurance coverage will need to be confirmed)  Never done   Zoster Vaccines- Shingrix (1 of 2) Never done    There are no preventive care reminders to display for this patient.  Lab Results  Component Value Date   TSH 0.81 03/06/2019   Lab Results  Component Value Date   WBC 9.4 02/18/2021   HGB 12.2 (L) 02/18/2021   HCT 38.1 (L) 02/18/2021   MCV 83.5 02/18/2021   PLT 216.0 02/18/2021   Lab Results  Component Value Date   NA 141 02/18/2021   K 4.1 02/18/2021   CO2 28 02/18/2021   GLUCOSE 118 (H) 02/18/2021   BUN 20 02/18/2021   CREATININE 1.24 02/18/2021   BILITOT 0.3 01/07/2020   ALKPHOS 93 01/07/2020   AST 12 01/07/2020   ALT 10 01/07/2020   PROT 7.1 01/07/2020   ALBUMIN 3.7 01/07/2020   CALCIUM 9.2 02/18/2021   ANIONGAP 9 09/27/2017   GFR 58.09 (L) 02/18/2021   Lab Results  Component Value Date   CHOL 103  01/07/2020   Lab Results  Component Value Date   HDL 26 (L) 01/07/2020   Lab Results  Component Value Date   LDLCALC 63 01/07/2020   Lab Results  Component Value Date   TRIG 60 01/07/2020   Lab Results  Component Value Date   CHOLHDL 4.0 01/07/2020   Lab Results  Component Value Date   HGBA1C 5.5 07/28/2017      Assessment & Plan:   Problem List Items Addressed This Visit       Cardiovascular and Mediastinum   Chronic systolic (congestive) heart failure (HCC)   CAD in native artery     Other   Iron deficiency anemia - Primary   Relevant Medications   Iron, Ferrous Sulfate, 325 (65 Fe) MG TABS   History of tobacco use   Muscle cramps   Relevant Orders   Basic metabolic panel (Completed)   Magnesium (Completed)   Urinalysis, Routine w reflex microscopic (Completed)    Meds ordered this encounter  Medications   Iron, Ferrous Sulfate, 325 (65 Fe) MG TABS    Sig: Take 325 mg by mouth daily.    Dispense:  180 tablet    Refill:  1     Follow-up: Return in about 3 months (around 05/20/2021).  Refuses flu vaccine.  Has never had a COVID-vaccine and does not believe he needs it.  Advised him to have both.  Continue iron tablets.  Urged to follow-up with cardiology for recheck.  Explained that cyclobenzaprine is not meant to be used chronically.  Advised him to stretch out his hands when he has cramps.  He understood.  Mliss Sax, MD

## 2021-02-19 LAB — IRON,TIBC AND FERRITIN PANEL
%SAT: 14 % (calc) — ABNORMAL LOW (ref 20–48)
Ferritin: 96 ng/mL (ref 24–380)
Iron: 43 ug/dL — ABNORMAL LOW (ref 50–180)
TIBC: 300 mcg/dL (calc) (ref 250–425)

## 2021-02-19 MED ORDER — IRON (FERROUS SULFATE) 325 (65 FE) MG PO TABS: 325.0000 mg | ORAL_TABLET | Freq: Every day | ORAL | 1 refills | Status: AC

## 2021-02-19 NOTE — Addendum Note (Signed)
Addended by: Andrez Grime on: 02/19/2021 07:51 AM   Modules accepted: Orders

## 2021-02-24 ENCOUNTER — Other Ambulatory Visit (HOSPITAL_COMMUNITY): Payer: Self-pay | Admitting: Interventional Cardiology

## 2021-02-24 ENCOUNTER — Other Ambulatory Visit: Payer: Self-pay | Admitting: Family Medicine

## 2021-02-24 MED ORDER — ENTRESTO 24-26 MG PO TABS: 1.0000 | ORAL_TABLET | Freq: Two times a day (BID) | ORAL | 0 refills | Status: DC

## 2021-03-02 ENCOUNTER — Other Ambulatory Visit: Payer: Self-pay

## 2021-03-02 NOTE — Telephone Encounter (Signed)
Refill request for: Cyclobenzaprine 5 mg LR 08/12/20, #60, 0 rf LOV 02/18/21 FOV 05/20/21  Please review and advise.   Thanks.  Dm/cma

## 2021-03-24 ENCOUNTER — Other Ambulatory Visit (HOSPITAL_COMMUNITY): Payer: Self-pay | Admitting: Interventional Cardiology

## 2021-03-31 ENCOUNTER — Telehealth: Payer: Self-pay | Admitting: Family Medicine

## 2021-03-31 NOTE — Telephone Encounter (Signed)
Pt has called in stating he took his regular medication this morning plus 2 50mg  tramadol. He is now throwing up every time he tries to drink coffee. I transferred him over to Nurse Triage.

## 2021-03-31 NOTE — Telephone Encounter (Signed)
Patient transferred to triage nurse earlier this morning. I called to check and see how patient was doing if he was still vomiting. No answer LMTCB

## 2021-04-01 NOTE — Telephone Encounter (Signed)
Called patient to check and see how he's feeling per patient he feels a lot better from yesterday he feels like he might have taken to much medicine yesterday. Advised patient to keep an eye on medications when taking to make sure he's not doubling up on them.

## 2021-05-20 ENCOUNTER — Ambulatory Visit: Payer: Medicare Other | Admitting: Family Medicine

## 2021-05-30 ENCOUNTER — Other Ambulatory Visit (HOSPITAL_COMMUNITY): Payer: Self-pay | Admitting: Interventional Cardiology

## 2021-06-30 ENCOUNTER — Other Ambulatory Visit (HOSPITAL_COMMUNITY): Payer: Self-pay | Admitting: Interventional Cardiology

## 2021-07-01 ENCOUNTER — Other Ambulatory Visit (HOSPITAL_COMMUNITY): Payer: Self-pay | Admitting: Interventional Cardiology

## 2021-07-11 ENCOUNTER — Other Ambulatory Visit (HOSPITAL_COMMUNITY): Payer: Self-pay | Admitting: Interventional Cardiology

## 2021-07-12 NOTE — Telephone Encounter (Signed)
°*  STAT* If patient is at the pharmacy, call can be transferred to refill team.   1. Which medications need to be refilled? (please list name of each medication and dose if known)  sacubitril-valsartan (ENTRESTO) 24-26 MG  2. Which pharmacy/location (including street and city if local pharmacy) is medication to be sent to? [id number] - [place], [place] - [address]  3. Do they need a 30 day or 90 day supply? Needs enough medication to last him until is upcoming appointment on 05/11.

## 2021-07-13 NOTE — Telephone Encounter (Signed)
Patient notified and voiced understanding.

## 2021-07-13 NOTE — Telephone Encounter (Signed)
Rx(s) sent to pharmacy electronically.  

## 2021-09-22 ENCOUNTER — Ambulatory Visit (HOSPITAL_COMMUNITY)
Admission: EM | Admit: 2021-09-22 | Discharge: 2021-09-22 | Disposition: A | Discharge status: Home / Self Care | Payer: Medicare Other | Attending: Family Medicine | Admitting: Family Medicine

## 2021-09-22 ENCOUNTER — Encounter (HOSPITAL_COMMUNITY): Payer: Self-pay

## 2021-09-22 DIAGNOSIS — K0889 Other specified disorders of teeth and supporting structures: Secondary | ICD-10-CM

## 2021-09-22 DIAGNOSIS — J069 Acute upper respiratory infection, unspecified: Secondary | ICD-10-CM

## 2021-09-22 MED ORDER — CLINDAMYCIN HCL 300 MG PO CAPS: 300.0000 mg | ORAL_CAPSULE | Freq: Three times a day (TID) | ORAL | 0 refills | Status: DC

## 2021-09-22 NOTE — ED Triage Notes (Signed)
Pt c/o productive cough with yellow sputum and nasal/chest congestion x2 days ? ?Pt c/o lt lower toothache x1wk. ?

## 2021-09-23 NOTE — ED Provider Notes (Signed)
[facility] ? ? ?[id number] ?09/22/21 Arrival Time: 1727 ? ?ASSESSMENT & PLAN: ? ?1. Viral URI with cough   ?2. Pain, dental   ? ?He is trying to est dental care. No sign of dental abscess. ?OTC symptom care as needed. ? ?Discharge Medication List as of 09/22/2021  6:32 PM  ?  ? ?START taking these medications  ? Details  ?clindamycin (CLEOCIN) 300 MG capsule Take 1 capsule (300 mg total) by mouth 3 (three) times daily., Starting Wed 09/22/2021, Normal  ?  ?  ? ?Discussed typical duration of viral URI. ? ?Reviewed expectations re: course of current medical issues. Questions answered. ?Outlined signs and symptoms indicating need for more acute intervention. ?Understanding verbalized. ?After Visit Summary given. ? ? ?SUBJECTIVE: ?History from: Patient. ?[name] is a 71 y.o. male. Reports: cough and nasal congestion; 2 days; afebrile. Also with lower dental pain; h/o freq dental infections; dental pain started about one week ago. Denies: difficulty breathing. Normal PO intake without n/v/d. ? ?OBJECTIVE: ? ?Vitals:  ? 09/22/21 1811  ?BP: 135/81  ?Pulse: 81  ?Resp: 18  ?Temp: (!) 97.5 ?F (36.4 ?C)  ?TempSrc: Oral  ?SpO2: 94%  ?  ?General appearance: alert; no distress ?Eyes: PERRLA; EOMI; conjunctiva normal ?HENT: [place]; AT; with mild nasal congestion; teeth in poor repair; missing several; lower L gum with mild erythema and TTP; no areas of fluctuance ?Neck: supple without LAD ?Lungs: speaks full sentences without difficulty; unlabored ?Extremities: no edema ?Skin: warm and dry ?Neurologic: normal gait ?Psychological: alert and cooperative; normal mood and affect ? ? ?Allergies  ?Allergen Reactions  ? Other Other (See Comments)  ?  Potatoes- Blurry eyes, eyes hurt, and patient passes out (thinks he may bed sensitive)  ? Tomato Other (See Comments)  ?  Potatoes- Blurry eyes, eyes hurt, and patient passes out (thinks he may bed sensitive)  ? ? ?Past Medical History:  ?Diagnosis Date  ? ACS (acute coronary  syndrome) (HCC)   ? Acute anterior wall MI (HCC)   ? Acute blood loss anemia   ? Acute heart failure (HCC)   ? Acute hypoxemic respiratory failure (HCC)   ? Acute pulmonary edema (HCC)   ? Acute respiratory failure with hypoxia (HCC)   ? Aneurysm of left ventricle of heart 04/12/2017  ? Anoxic brain injury (HCC)   ? Bacteremia   ? Benign essential HTN   ? CAD in native artery   ? Cardiogenic shock (HCC)   ? CHF (congestive heart failure), NYHA class II, acute, combined (HCC) 04/11/2017  ? Chronic neck pain   ? Chronic systolic (congestive) heart failure (HCC)   ? Confusion   ? Congestive heart failure (CHF) (HCC)   ? COPD (chronic obstructive pulmonary disease) (HCC) 04/11/2017  ? Coronary artery disease   ? Dysphagia   ? Encounter for central line placement   ? Encounter for management of intra-aortic balloon pump   ? Essential hypertension   ? Fever   ? Hyperglycemia   ? Hypertension   ? Hypokalemia   ? Hypotension due to drugs   ? Hypoxemia   ? Labile blood pressure   ? Leukocytosis   ? Physical debility 04/27/2017  ? Rash 05/15/2017  ? Sepsis (HCC)   ? Sore throat   ? STEMI (ST elevation myocardial infarction) (HCC) 04/11/2017  ? Supplemental oxygen dependent   ? Tachypnea   ? 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 [facility](HCC)   ? ?Social History  ? ?Socioeconomic  History  ? Marital status: Divorced  ?  Spouse name: Not on file  ? Number of children: Not on file  ? Years of education: 1  ? Highest education level: Associate degree: occupational, Scientist, product/process development, or vocational program  ?Occupational History  ? Occupation: Retired  ?Tobacco Use  ? Smoking status: Former  ?  Packs/day: 1.50  ?  Years: 53.00  ?  Pack years: 79.50  ?  Types: Cigarettes  ?  Quit date: 04/11/2017  ?  Years since quitting: 4.4  ? Smokeless tobacco: Never  ?Vaping Use  ? Vaping Use: Never used  ?Substance and Sexual Activity  ? Alcohol use: Not Currently  ? Drug use: Never  ? Sexual activity: Not Currently  ?Other  Topics Concern  ? Not on file  ?Social History Narrative  ? Not on file  ? ?Social Determinants of Health  ? ?Financial Resource Strain: Low Risk   ? Difficulty of Paying Living Expenses: Not hard at all  ?Food Insecurity: Unknown  ? Worried About Programme researcher, broadcasting/film/video in the Last Year: Patient refused  ? Ran Out of Food in the Last Year: Patient refused  ?Transportation Needs: No Transportation Needs  ? Lack of Transportation (Medical): No  ? Lack of Transportation (Non-Medical): No  ?Physical Activity: Unknown  ? Days of Exercise per Week: Patient refused  ? Minutes of Exercise per Session: Patient refused  ?Stress: No Stress Concern Present  ? Feeling of Stress : Not at all  ?Social Connections: Moderately Isolated  ? Frequency of Communication with Friends and Family: More than three times a week  ? Frequency of Social Gatherings with Friends and Family: More than three times a week  ? Attends Religious Services: More than 4 times per year  ? Active Member of Clubs or Organizations: No  ? Attends Banker Meetings: Never  ? Marital Status: Divorced  ?Intimate Partner Violence: Not At Risk  ? Fear of Current or Ex-Partner: No  ? Emotionally Abused: No  ? Physically Abused: No  ? Sexually Abused: No  ? ?Family History  ?Problem Relation Age of Onset  ? Esophageal cancer Mother   ? ?Past Surgical History:  ?Procedure Laterality Date  ? APPLICATION OF WOUND VAC N/A 04/14/2017  ? Procedure: APPLICATION OF WOUND VAC with reposition of Impella device;  Surgeon: Delight Ovens, MD;  Location: [facility] OR;  Service: Thoracic;  Laterality: N/A;  ? CARDIAC CATHETERIZATION    ? CORONARY STENT INTERVENTION N/A 04/11/2017  ? Procedure: CORONARY STENT INTERVENTION;  Surgeon: Corky Crafts, MD;  Location: [facility] INVASIVE CV LAB;  Service: Cardiovascular;  Laterality: N/A;  ? CORONARY THROMBECTOMY N/A 04/11/2017  ? Procedure: Coronary Thrombectomy;  Surgeon: Corky Crafts, MD;  Location: [facility] INVASIVE CV LAB;   Service: Cardiovascular;  Laterality: N/A;  ? CORONARY/GRAFT ACUTE MI REVASCULARIZATION N/A 04/11/2017  ? Procedure: Coronary/Graft Acute MI Revascularization;  Surgeon: Corky Crafts, MD;  Location: [facility] INVASIVE CV LAB;  Service: Cardiovascular;  Laterality: N/A;  ? IABP INSERTION N/A 04/11/2017  ? Procedure: IABP Insertion;  Surgeon: Corky Crafts, MD;  Location: [facility] INVASIVE CV LAB;  Service: Cardiovascular;  Laterality: N/A;  ? LEFT HEART CATH AND CORONARY ANGIOGRAPHY N/A 04/11/2017  ? Procedure: LEFT HEART CATH AND CORONARY ANGIOGRAPHY;  Surgeon: Corky Crafts, MD;  Location: [facility] INVASIVE CV LAB;  Service: Cardiovascular;  Laterality: N/A;  ? PLACEMENT OF IMPELLA LEFT VENTRICULAR ASSIST DEVICE  04/12/2017  ? Procedure: PLACEMENT OF IMPELLA  LD;  Surgeon: Delight Ovens, MD;  Location: [facility] OR;  Service: Open Heart Surgery;;  ? REMOVAL OF IMPELLA LEFT VENTRICULAR ASSIST DEVICE N/A 04/17/2017  ? Procedure: REMOVAL OF IMPELLA LEFT VENTRICULAR ASSIST DEVICE;  Surgeon: Delight Ovens, MD;  Location: [facility] OR;  Service: Open Heart Surgery;  Laterality: N/A;  ? REPAIR OF RIGHT VENTRICLE LACERATION Left 04/12/2017  ? Procedure: Resection of LV Aneurysm;  Surgeon: Delight Ovens, MD;  Location: [facility] OR;  Service: Open Heart Surgery;  Laterality: Left;  ? STERNAL CLOSURE N/A 04/14/2017  ? Procedure: STERNAL CLOSURE;  Surgeon: Delight Ovens, MD;  Location: [facility] OR;  Service: Thoracic;  Laterality: N/A;  ? TEE WITHOUT CARDIOVERSION N/A 04/14/2017  ? Procedure: TRANSESOPHAGEAL ECHOCARDIOGRAM (TEE);  Surgeon: Delight Ovens, MD;  Location: [facility] OR;  Service: Thoracic;  Laterality: N/A;  ? ULTRASOUND GUIDANCE FOR VASCULAR ACCESS  04/11/2017  ? Procedure: Ultrasound Guidance For Vascular Access;  Surgeon: Corky Crafts, MD;  Location: [facility] INVASIVE CV LAB;  Service: Cardiovascular;;  ? ?  ?Mardella Layman, MD ?09/23/21 1301 ? ?

## 2021-09-24 ENCOUNTER — Other Ambulatory Visit: Payer: Self-pay

## 2021-09-24 ENCOUNTER — Other Ambulatory Visit (HOSPITAL_COMMUNITY): Payer: Self-pay | Admitting: Interventional Cardiology

## 2021-09-24 MED ORDER — ENTRESTO 24-26 MG PO TABS: 1.0000 | ORAL_TABLET | Freq: Two times a day (BID) | ORAL | 0 refills | Status: DC

## 2021-10-06 NOTE — Progress Notes (Signed)
?  ?Cardiology Office Note ? ? ?Date:  10/07/2021  ? ?ID:  [name], DOB Sep 06, 1950, MRN [id number] ? ?PCP:  No primary care provider on file.  ? ? ?No chief complaint on file. ? ?CAD ? ?Wt Readings from Last 3 Encounters:  ?10/07/21 232 lb 3.2 oz (105.3 kg)  ?02/18/21 234 lb (106.1 kg)  ?07/29/20 226 lb (102.5 kg)  ?  ? ?  ?History of Present Illness: ?[name] is a 71 y.o. 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 Chronic systolic CHF secondary cardiogenic shock EF 25% improved 55% echo 08/22/17. HTN, PAF converted NSR-Amio stopped by Dr. Haroldine Laws, Because of bleeding issues in the past he didn't want stronger blood thinners, COPD. ? ?He has quit smoking. ? ?Denies : exertional Chest pain. Dizziness. Leg edema. Nitroglycerin use. Orthopnea. Palpitations. Paroxysmal nocturnal dyspnea. Shortness of breath. Syncope.   ? ?Eats healthy.  Not walking regularly.  Is limited by back and hip pain. ? ?Past Medical History:  ?Diagnosis Date  ? ACS (acute coronary syndrome) ([place])   ? Acute anterior wall MI ([place])   ? Acute blood loss anemia   ? Acute heart failure ([place])   ? Acute hypoxemic respiratory failure ([place])   ? Acute pulmonary edema (H[facility])   ? Acute respiratory failure with hypoxia ([place])   ? Aneurysm of left ventricle of heart 04/12/2017  ? Anoxic brain injury ([place])   ? Bacteremia   ? Benign essential HTN   ? CAD in native artery   ? Cardiogenic shock ([place])   ? CHF (congestive heart failure), NYHA class II, acute, combined ([place]) 04/11/2017  ? Chronic neck pain   ? Chronic systolic (congestive) heart failure (HCC)   ? Confusion   ? Congestive heart failure (CHF) ([place])   ? COPD (chronic obstructive pulmonary disease) ([place]) 04/11/2017  ? Coronary artery disease   ? Dysphagia   ? Encounter for central line placement   ? Encounter for management of intra-aortic balloon pump   ? Essential hypertension   ?  Fever   ? Hyperglycemia   ? Hypertension   ? Hypokalemia   ? Hypotension due to drugs   ? Hypoxemia   ? Labile blood pressure   ? Leukocytosis   ? Physical debility 04/27/2017  ? Rash 05/15/2017  ? Sepsis ([place])   ? Sore throat   ? STEMI (ST elevation myocardial infarction) ([place]) 04/11/2017  ? Supplemental oxygen dependent   ? Tachypnea   ? 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 [facility])   ? ? ?Past Surgical History:  ?Procedure Laterality Date  ? APPLICATION OF WOUND VAC N/A 04/14/2017  ? Procedure: APPLICATION OF WOUND VAC with reposition of Impella device;  Surgeon: Grace Isaac, MD;  Location: [place];  Service: Thoracic;  Laterality: N/A;  ? CARDIAC CATHETERIZATION    ? CORONARY STENT INTERVENTION N/A 04/11/2017  ? Procedure: CORONARY STENT INTERVENTION;  Surgeon: Jettie Booze, MD;  Location: [place] [facility];  Service: Cardiovascular;  Laterality: N/A;  ? CORONARY THROMBECTOMY N/A 04/11/2017  ? Procedure: Coronary Thrombectomy;  Surgeon: Jettie Booze, MD;  Location: [place] [facility];  Service: Cardiovascular;  Laterality: N/A;  ? CORONARY/GRAFT ACUTE MI REVASCULARIZATION N/A 04/11/2017  ? Procedure: Coronary/Graft Acute MI Revascularization;  Surgeon: Jettie Booze, MD;  Location: [place] [facility];  Service: Cardiovascular;  Laterality: N/A;  ?  IABP INSERTION N/A 04/11/2017  ? Procedure: IABP Insertion;  Surgeon: Jettie Booze, MD;  Location: [place] [facility];  Service: Cardiovascular;  Laterality: N/A;  ? LEFT HEART CATH AND CORONARY ANGIOGRAPHY N/A 04/11/2017  ? Procedure: LEFT HEART CATH AND CORONARY ANGIOGRAPHY;  Surgeon: Jettie Booze, MD;  Location: [place] [facility];  Service: Cardiovascular;  Laterality: N/A;  ? PLACEMENT OF IMPELLA LEFT VENTRICULAR ASSIST DEVICE  04/12/2017  ? Procedure: PLACEMENT OF IMPELLA  LD;  Surgeon: Grace Isaac, MD;  [facility];  Service: Open Heart Surgery;;  ? REMOVAL OF IMPELLA LEFT  VENTRICULAR ASSIST DEVICE N/A 04/17/2017  ? Procedure: REMOVAL OF IMPELLA LEFT VENTRICULAR ASSIST DEVICE;  Surgeon: Grace Isaac, MD;  Location: [place];  Service: Open Heart Surgery;  Laterality: N/A;  ? REPAIR OF RIGHT VENTRICLE LACERATION Left 04/12/2017  ? Procedure: Resection of LV Aneurysm;  Surgeon: Grace Isaac, MD;  Location: [place];  Service: Open Heart Surgery;  Laterality: Left;  ? STERNAL CLOSURE N/A 04/14/2017  ? Procedure: STERNAL CLOSURE;  Surgeon: Grace Isaac, MD;  Location: [facility] OR;  Service: Thoracic;  Laterality: N/A;  ? TEE WITHOUT CARDIOVERSION N/A 04/14/2017  ? Procedure: TRANSESOPHAGEAL ECHOCARDIOGRAM (TEE);  Surgeon: Grace Isaac, MD;  Location: [facility] OR;  Service: Thoracic;  Laterality: N/A;  ? ULTRASOUND GUIDANCE FOR VASCULAR ACCESS  04/11/2017  ? Procedure: Ultrasound Guidance For Vascular Access;  Surgeon: Jettie Booze, MD;  Location: [place] [facility];  Service: Cardiovascular;;  ? ? ? ?Current Outpatient Medications  ?Medication Sig Dispense Refill  ? aspirin 81 MG chewable tablet Chew 81 mg by mouth daily.    ? atorvastatin (LIPITOR) 80 MG tablet TAKE 1 TABLET (80 MG) BY ORAL ROUTE ONCE DAILY, AT 6PM 90 tablet 3  ? carvedilol (COREG) 3.125 MG tablet TAKE 1 TABLET BY MOUTH TWICE A DAY WITH FOOD 180 tablet 3  ? Cholecalciferol (VITAMIN D3) 125 MCG (5000 UT) CAPS Take by mouth.    ? clindamycin (CLEOCIN) 300 MG capsule Take 1 capsule (300 mg total) by mouth 3 (three) times daily. 30 capsule 0  ? clopidogrel (PLAVIX) 75 MG tablet Take 1 tablet (75 mg total) by mouth daily. 90 tablet 2  ? cyclobenzaprine (FLEXERIL) 5 MG tablet TAKE 1 TABLET BY MOUTH 2 TIMES DAILY AS NEEDED FOR MUSCLE SPASMS. 60 tablet 0  ? Iron, Ferrous Sulfate, 325 (65 Fe) MG TABS Take 325 mg by mouth daily. 180 tablet 1  ? Multiple Vitamins-Minerals (MULTIVITAMIN WITH MINERALS) tablet Take 1 tablet by mouth daily.    ? sacubitril-valsartan (ENTRESTO) 24-26 MG Take 1 tablet by mouth 2 (two) times  daily. 60 tablet 0  ? ?No current facility-administered medications for this visit.  ? ? ?Allergies:   Other and Tomato  ? ? ?Social History:  The patient  reports that he quit smoking about 4 years ago. His smoking use included cigarettes. He has a 79.50 pack-year smoking history. He has never used smokeless tobacco. He reports that he does not currently use alcohol. He reports that he does not use drugs.  ? ?Family History:  The patient's family history includes Esophageal cancer in his mother.  ? ? ?ROS:  Please see the history of present illness.   Otherwise, review of systems are positive for back and hip pain.   All other systems are reviewed and negative.  ? ? ?PHYSICAL EXAM: ?VS:  BP 124/72   Pulse 70   Ht 5\' 7"  (  1.702 m)   Wt 232 lb 3.2 oz (105.3 kg)   SpO2 95%   BMI 36.37 kg/m?  , BMI Body mass index is 36.37 kg/m?. ?GEN: Well nourished, well developed, in no acute distress ?HEENT: normal ?Neck: no JVD, carotid bruits, or masses ?Cardiac: RRR; no murmurs, rubs, or gallops,no edema  ?Respiratory:  clear to auscultation bilaterally, normal work of breathing ?GI: soft, nontender, nondistended, + BS ?MS: no deformity or atrophy ?Skin: warm and dry, no rash ?Neuro:  Strength and sensation are intact ?Psych: euthymic mood, full affect ? ? ?EKG:   ?The ekg ordered today demonstrates NSR, anterolateral T wave inversions- no change from prior ? ? ?Recent Labs: ?02/18/2021: BUN 20; Creatinine, Ser 1.24; Hemoglobin 12.2; Magnesium 1.8; Platelets 216.0; Potassium 4.1; Sodium 141  ? ?Lipid Panel ?   ?Component Value Date/Time  ? CHOL 103 01/07/2020 1330  ? TRIG 60 01/07/2020 1330  ? HDL 26 (L) 01/07/2020 1330  ? CHOLHDL 4.0 01/07/2020 1330  ? CHOLHDL 4 03/06/2019 1347  ? VLDL 16.4 03/06/2019 1347  ? LDLCALC 63 01/07/2020 1330  ? ?  ?Other studies Reviewed: ?Additional studies/ records that were reviewed today with results demonstrating: labs reviewed LDL 63 in 8/21. ? ? ?ASSESSMENT AND PLAN: ? ?CAD/Old MI: Taking  clopidogrel daily.  Rare aspirin use.  Had LAD intervention with poor runoff.  Status post distal RCA stent. ?Ischemia cardiomyopathy: EF has improved by f/u echo.  He appears euvolemic. ?PAF: No palpitations.  [name]

## 2021-10-07 ENCOUNTER — Encounter: Payer: Self-pay | Admitting: Interventional Cardiology

## 2021-10-07 ENCOUNTER — Ambulatory Visit (INDEPENDENT_AMBULATORY_CARE_PROVIDER_SITE_OTHER): Payer: Medicare Other | Admitting: Interventional Cardiology

## 2021-10-07 VITALS — BP 124/72 | HR 70 | Ht 67.0 in | Wt 232.2 lb

## 2021-10-07 DIAGNOSIS — N1831 Chronic kidney disease, stage 3a: Secondary | ICD-10-CM | POA: Diagnosis not present

## 2021-10-07 DIAGNOSIS — I255 Ischemic cardiomyopathy: Secondary | ICD-10-CM

## 2021-10-07 DIAGNOSIS — I480 Paroxysmal atrial fibrillation: Secondary | ICD-10-CM

## 2021-10-07 DIAGNOSIS — I2510 Atherosclerotic heart disease of native coronary artery without angina pectoris: Secondary | ICD-10-CM

## 2021-10-07 NOTE — Patient Instructions (Signed)
Medication Instructions:  ?Your physician recommends that you continue on your current medications as directed. Please refer to the Current Medication list given to you today. ? ?*If you need a refill on your cardiac medications before your next appointment, please call your pharmacy* ? ? ?Lab Work: ?none ?If you have labs (blood work) drawn today and your tests are completely normal, you will receive your results only by: ?MyChart Message (if you have MyChart) OR ?A paper copy in the mail ?If you have any lab test that is abnormal or we need to change your treatment, we will call you to review the results. ? ? ?Testing/Procedures: ?none ? ? ?Follow-Up: ?At [facility], you and your health needs are our priority.  As part of our continuing mission to provide you with exceptional heart care, we have created designated Provider Care Teams.  These Care Teams include your primary Cardiologist (physician) and Advanced Practice Providers (APPs -  Physician Assistants and Nurse Practitioners) who all work together to provide you with the care you need, when you need it. ? ?We recommend signing up for the patient portal called "MyChart".  Sign up information is provided on this After Visit Summary.  MyChart is used to connect with patients for Virtual Visits (Telemedicine).  Patients are able to view lab/test results, encounter notes, upcoming appointments, etc.  Non-urgent messages can be sent to your provider as well.   ?To learn more about what you can do with MyChart, go to [email or web address].   ? ?Your next appointment:   ?12 month(s) ? ?The format for your next appointment:   ?In Person ? ?Provider:   ?{ ?Dr Eldridge Dace ? ? ?Other Instructions ?  ? ?Important Information About Sugar ? ? ? ? ? ? ?

## 2021-10-22 ENCOUNTER — Other Ambulatory Visit: Payer: Self-pay | Admitting: Interventional Cardiology

## 2021-11-30 ENCOUNTER — Other Ambulatory Visit: Payer: Self-pay | Admitting: Interventional Cardiology

## 2021-12-21 ENCOUNTER — Other Ambulatory Visit: Payer: Self-pay | Admitting: Interventional Cardiology

## 2022-01-06 ENCOUNTER — Ambulatory Visit (HOSPITAL_COMMUNITY)
Admission: EM | Admit: 2022-01-06 | Discharge: 2022-01-06 | Disposition: A | Discharge status: Home / Self Care | Payer: Medicare Other | Attending: Internal Medicine | Admitting: Internal Medicine

## 2022-01-06 ENCOUNTER — Encounter (HOSPITAL_COMMUNITY): Payer: Self-pay

## 2022-01-06 DIAGNOSIS — R300 Dysuria: Secondary | ICD-10-CM | POA: Diagnosis not present

## 2022-01-06 DIAGNOSIS — N3001 Acute cystitis with hematuria: Secondary | ICD-10-CM

## 2022-01-06 LAB — BASIC METABOLIC PANEL
Anion gap: 7 (ref 5–15)
BUN: 15 mg/dL (ref 8–23)
CO2: 26 mmol/L (ref 22–32)
Calcium: 8.7 mg/dL — ABNORMAL LOW (ref 8.9–10.3)
Chloride: 106 mmol/L (ref 98–111)
Creatinine, Ser: 1.3 mg/dL — ABNORMAL HIGH (ref 0.61–1.24)
GFR, Estimated: 58 mL/min — ABNORMAL LOW (ref >60)
Glucose, Bld: 96 mg/dL (ref 70–99)
Potassium: 4.3 mmol/L (ref 3.5–5.1)
Sodium: 139 mmol/L (ref 135–145)

## 2022-01-06 LAB — CBC
HCT: 39.5 % (ref 39.0–52.0)
Hemoglobin: 12.6 g/dL — ABNORMAL LOW (ref 13.0–17.0)
MCH: 28.3 pg (ref 26.0–34.0)
MCHC: 31.9 g/dL (ref 30.0–36.0)
MCV: 88.8 fL (ref 80.0–100.0)
Platelets: 220 10*3/uL (ref 150–400)
RBC: 4.45 MIL/uL (ref 4.22–5.81)
RDW: 15.2 % (ref 11.5–15.5)
WBC: 8.1 10*3/uL (ref 4.0–10.5)
nRBC: 0 % (ref 0.0–0.2)

## 2022-01-06 LAB — POCT URINALYSIS DIPSTICK, ED / UC
Bilirubin Urine: NEGATIVE
Glucose, UA: NEGATIVE mg/dL
Ketones, ur: NEGATIVE mg/dL
Nitrite: NEGATIVE
Protein, ur: NEGATIVE mg/dL
Specific Gravity, Urine: >=1.03 (ref 1.005–1.030)
Urobilinogen, UA: 1 mg/dL (ref 0.0–1.0)
pH: 5.5 (ref 5.0–8.0)

## 2022-01-06 MED ORDER — CEPHALEXIN 500 MG PO CAPS: 500.0000 mg | ORAL_CAPSULE | Freq: Two times a day (BID) | ORAL | 0 refills | Status: AC

## 2022-01-06 MED ORDER — CEPHALEXIN 250 MG PO CAPS: 250.0000 mg | ORAL_CAPSULE | Freq: Three times a day (TID) | ORAL | 0 refills | Status: DC

## 2022-01-06 MED ORDER — NITROFURANTOIN MONOHYD MACRO 100 MG PO CAPS: 100.0000 mg | ORAL_CAPSULE | Freq: Two times a day (BID) | ORAL | 0 refills | Status: DC

## 2022-01-06 NOTE — ED Provider Notes (Signed)
[facility]    CSN: [id number] Arrival date & time: 01/06/22  [id number]      History   Chief Complaint Chief Complaint  Patient presents with   Urinary Incontinence    HPI [name] is a 71 y.o. male.   Patient presents urgent care for evaluation of dysuria, urinary frequency, and urinary urgency over the last 3 weeks intermittently.  States that the symptoms have become worse over the last couple of days and he has had a difficult time making it to the bathroom on time without urinating in his underwear.  Denies gross hematuria, abdominal pain, nausea, vomiting, diarrhea, constipation, back pain, dizziness, URI symptoms, and fever/chills.  Patient does state that his urinary symptoms improve after he is able to pass gas from his rectum.  No blood or mucus to the stools reported. Denies penile discharge, recent new sexual partners, or rash to the genital area.  He has not attempted use of any over-the-counter medications for his symptoms prior to arrival to urgent care.  Denies frequent urinary tract infections, but does report drinking coffee, tea, and soda.      Past Medical History:  Diagnosis Date   ACS (acute coronary syndrome) (HCC)    Acute anterior wall MI (HCC)    Acute blood loss anemia    Acute heart failure (HCC)    Acute hypoxemic respiratory failure (HCC)    Acute pulmonary edema (HCC)    Acute respiratory failure with hypoxia (HCC)    Aneurysm of left ventricle of heart 04/12/2017   Anoxic brain injury (HCC)    Bacteremia    Benign essential HTN    CAD in native artery    Cardiogenic shock (HCC)    CHF (congestive heart failure), NYHA class II, acute, combined (HCC) 04/11/2017   Chronic neck pain    Chronic systolic (congestive) heart failure (HCC)    Confusion    Congestive heart failure (CHF) (HCC)    COPD (chronic obstructive pulmonary disease) (HCC) 04/11/2017   Coronary artery disease    Dysphagia    Encounter for central line placement     Encounter for management of intra-aortic balloon pump    Essential hypertension    Fever    Hyperglycemia    Hypertension    Hypokalemia    Hypotension due to drugs    Hypoxemia    Labile blood pressure    Leukocytosis    Physical debility 04/27/2017   Rash 05/15/2017   Sepsis (HCC)    Sore throat    STEMI (ST elevation myocardial infarction) (HCC) 04/11/2017   Supplemental oxygen dependent    Tachypnea    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 [facility])     Patient Active Problem List   Diagnosis Date Noted   History of tobacco use 02/18/2021   Muscle cramps 02/18/2021   Well adult exam 03/06/2019   Penile discharge 09/26/2018   Need for hepatitis C screening test 10/06/2017   Elevated serum creatinine 10/06/2017   Iron deficiency anemia 10/06/2017   Essential hypertension    Confusion    Anoxic brain injury (HCC)    Chronic systolic (congestive) heart failure (HCC)    CAD in native artery    Physical debility 04/27/2017   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 [facility])    Congestive heart failure (CHF) (HCC)    Dysphagia    Chronic neck pain    Aneurysm of left ventricle of heart 04/12/2017   Encounter  for screening colonoscopy    STEMI (ST elevation myocardial infarction) (HCC) 04/11/2017   CHF (congestive heart failure), NYHA class II, acute, combined (HCC) 04/11/2017    Past Surgical History:  Procedure Laterality Date   APPLICATION OF WOUND VAC N/A 04/14/2017   Procedure: APPLICATION OF WOUND VAC with reposition of Impella device;  Surgeon: Delight Ovens, MD;  Location: [facility] OR;  Service: Thoracic;  Laterality: N/A;   CARDIAC CATHETERIZATION     CORONARY STENT INTERVENTION N/A 04/11/2017   Procedure: CORONARY STENT INTERVENTION;  Surgeon: Corky Crafts, MD;  Location: [facility] INVAS[facility] CV LAB;  Service: Cardiovascular;  Laterality: N/A;   CORONARY THROMBECTOMY N/A 04/11/2017   Procedure: Coronary  Thrombectomy;  Surgeon: Corky Crafts, MD;  Location: [facility] INVASIVE CV LAB;  Service: Cardiovascular;  Laterality: N/A;   CORONARY/GRAFT ACUTE MI REVASCULARIZATION N/A 04/11/2017   Procedure: Coronary/Graft Acute MI Revascularization;  Surgeon: Corky Crafts, MD;  Location: [facility] INVASIVE CV LAB;  Service: Cardiovascular;  Laterality: N/A;   IABP INSERTION N/A 04/11/2017   Procedure: IABP Insertion;  Surgeon: Corky Crafts, MD;  Location: [facility] INVASIVE CV LAB;  Service: Cardiovascular;  Laterality: N/A;   LEFT HEART CATH AND CORONARY ANGIOGRAPHY N/A 04/11/2017   Procedure: LEFT HEART CATH AND CORONARY ANGIOGRAPHY;  Surgeon: Corky Crafts, MD;  Location: [facility] INVASIVE CV LAB;  Service: Cardiovascular;  Laterality: N/A;   PLACEMENT OF IMPELLA LEFT VENTRICULAR ASSIST DEVICE  04/12/2017   Procedure: PLACEMENT OF IMPELLA  LD;  Surgeon: Delight Ovens, MD;  Location: [facility] OR;  Service: Open Heart Surgery;;   REMOVAL OF IMPELLA LEFT VENTRICULAR ASSIST DEVICE N/A 04/17/2017   Procedure: REMOVAL OF IMPELLA LEFT VENTRICULAR ASSIST DEVICE;  Surgeon: Delight Ovens, MD;  Location: [facility] OR;  Service: Open Heart Surgery;  Laterality: N/A;   REPAIR OF RIGHT VENTRICLE LACERATION Left 04/12/2017   Procedure: Resection of LV Aneurysm;  Surgeon: Delight Ovens, MD;  Location[facility] OR;  Service: Open Heart Surgery;  Laterality: Left;   STERNAL CLOSURE N/A 04/14/2017   Procedure: STERNAL CLOSURE;  Surgeon: Delight Ovens, MD;  Location: [facility] OR;  Service: Thoracic;  Laterality: N/A;   TEE WITHOUT CARDIOVERSION N/A 04/14/2017   Procedure: TRANSESOPHAGEAL ECHOCARDIOGRAM (TEE);  Surgeon: Delight Ovens, MD;  Location: [facility] OR;  Service: Thoracic;  Laterality: N/A;   ULTRASOUND GUIDANCE FOR VASCULAR ACCESS  04/11/2017   Procedure: Ultrasound Guidance For Vascular Access;  Surgeon: Corky Crafts, MD;  Location: [facility] INVASIVE CV LAB;  Service: Cardiovascular;;       Home Medications     Prior to Admission medications   Medication Sig Start Date End Date Taking? Authorizing Provider  aspirin 81 MG chewable tablet Chew 81 mg by mouth daily.    [provider]  atorvastatin (LIPITOR) 80 MG tablet TAKE 1 TABLET (80 MG) BY ORAL ROUTE ONCE DAILY, AT [place] 02/24/21   Mliss Sax, MD  carvedilol (COREG) 3.125 MG tablet TAKE 1 TABLET BY MOUTH TWICE A DAY WITH FOOD 12/01/21   Corky Crafts, MD  cephALEXin (KEFLEX) 500 MG capsule Take 1 capsule (500 mg total) by mouth 2 (two) times daily for 10 days. 01/06/22 01/16/22  Carlisle Beers, FNP  Cholecalciferol (VITAMIN D3) 125 MCG (5000 UT) CAPS Take by mouth.    [provider]  clindamycin (CLEOCIN) 300 MG capsule Take 1 capsule (300 mg total) by mouth 3 (three) times daily. 09/22/21   Mardella Layman, MD  clopidogrel (PLAVIX) 75  MG tablet TAKE 1 TABLET BY MOUTH EVERY DAY 12/21/21   Corky Crafts, MD  cyclobenzaprine (FLEXERIL) 5 MG tablet TAKE 1 TABLET BY MOUTH 2 TIMES DAILY AS NEEDED FOR MUSCLE SPASMS. 08/12/20   Overton Mam, DO  ENTRESTO 24-26 MG TAKE 1 TABLET BY MOUTH TWICE A DAY 10/22/21   Corky Crafts, MD  Iron, Ferrous Sulfate, 325 (65 Fe) MG TABS Take 325 mg by mouth daily. 02/19/21   Mliss Sax, MD  Multiple Vitamins-Minerals (MULTIVITAMIN WITH MINERALS) tablet Take 1 tablet by mouth daily.    [provider]    Family History Family History  Problem Relation Age of Onset   Esophageal cancer Mother     Social History Social History   Tobacco Use   Smoking status: Former    Packs/day: 1.50    Years: 53.00    Total pack years: 79.50    Types: Cigarettes    Quit date: 04/11/2017    Years since quitting: 4.7   Smokeless tobacco: Never  Vaping Use   Vaping Use: Never used  Substance Use Topics   Alcohol use: Not Currently   Drug use: Never     Allergies   Other and Tomato   Review of Systems Review of Systems Per HPI  Physical  Exam Triage Vital Signs ED Triage Vitals  Enc Vitals Group     BP 01/06/22 1009 (!) 140/40     Pulse Rate 01/06/22 1009 81     Resp 01/06/22 1009 18     Temp 01/06/22 1009 98.2 F (36.8 C)     Temp Source 01/06/22 1009 Oral     SpO2 01/06/22 1009 92 %     Weight --      Height --      Head Circumference --      Peak Flow --      Pain Score 01/06/22 1010 0     Pain Loc --      Pain Edu? --      Excl. in GC? --    No data found.  Updated Vital Signs BP (!) 140/40 (BP Location: Left Arm)   Pulse 81   Temp 98.2 F (36.8 C) (Oral)   Resp 18   SpO2 92%   Visual Acuity Right Eye Distance:   Left Eye Distance:   Bilateral Distance:    Right Eye Near:   Left Eye Near:    Bilateral Near:     Physical Exam Vitals and nursing note reviewed.  Constitutional:      Appearance: Normal appearance. He is not ill-appearing or toxic-appearing.     Comments: Very pleasant patient sitting on exam in position of comfort table in no acute distress.   HENT:     Head: Normocephalic and atraumatic.     Right Ear: Hearing and external ear normal.     Left Ear: Hearing and external ear normal.     Nose: Nose normal.     Mouth/Throat:     Lips: Pink.     Mouth: Mucous membranes are moist.  Eyes:     General: Lids are normal. Vision grossly intact. Gaze aligned appropriately.     Extraocular Movements: Extraocular movements intact.     Conjunctiva/sclera: Conjunctivae normal.  Cardiovascular:     Rate and Rhythm: Normal rate and regular rhythm.     Heart sounds: Normal heart sounds, S1 normal and S2 normal.  Pulmonary:     Effort: Pulmonary effort is normal.  No respiratory distress.     Breath sounds: Normal breath sounds and air entry.  Abdominal:     General: Bowel sounds are normal.     Palpations: Abdomen is soft.     Tenderness: There is no abdominal tenderness. There is no right CVA tenderness, left CVA tenderness or guarding.  Musculoskeletal:     Cervical back: Neck  supple.  Skin:    General: Skin is warm and dry.     Capillary Refill: Capillary refill takes less than 2 seconds.     Findings: No rash.  Neurological:     General: No focal deficit present.     Mental Status: He is alert and oriented to person, place, and time. Mental status is at baseline.     Cranial Nerves: No dysarthria or facial asymmetry.     Gait: Gait is intact.  Psychiatric:        Mood and Affect: Mood normal.        Speech: Speech normal.        Behavior: Behavior normal.        Thought Content: Thought content normal.        Judgment: Judgment normal.      UC Treatments / Results  Labs (all labs ordered are listed, but only abnormal results are displayed) Labs Reviewed  POCT URINALYSIS DIPSTICK, ED / UC - Abnormal; Notable for the following components:      Result Value   Hgb urine dipstick TRACE (*)    Leukocytes,Ua TRACE (*)    All other components within normal limits  URINE CULTURE  CBC  BASIC METABOLIC PANEL    EKG   Radiology No results found.  Procedures Procedures (including critical care time)  Medications Ordered in UC Medications - No data to display  Initial Impression / Assessment and Plan / UC Course  I have reviewed the triage vital signs and the nursing notes.  Pertinent labs & imaging results that were available during my care of the patient were reviewed by me and considered in my medical decision making (see chart for details).   1.  Acute cystitis with hematuria Trace hemoglobin and trace leukocytes present to urinalysis.  Considered possible prostatitis cause for patient's symptoms although this is unlikely.  Patient has not had recent basic blood work; thus, CBC and BMP drawn today to assess for blood levels and kidney function.  Keflex antibiotic twice daily for the next 10 days prescribed to treat acute cystitis.  Urine culture is sent.  We will call patient to change antibiotic based on results of urine culture if needed.   Advised decreased intake of coffee, tea, and soda and increased intake of water to maintain hydration.   Advised patient to follow-up with PCP for further evaluation of symptoms as well as for prostate screening antigen testing.  Patient states that he will schedule an appointment with his PCP for physical for ongoing health maintenance soon.   Discussed physical exam and available lab work findings in clinic with patient.  Counseled patient regarding appropriate use of medications and potential side effects for all medications recommended or prescribed today. Discussed red flag signs and symptoms of worsening condition,when to call the PCP office, return to urgent care, and when to seek higher level of care in the emergency department. Patient verbalizes understanding and agreement with plan. All questions answered. Patient discharged in stable condition.  Final Clinical Impressions(s) / UC Diagnoses   Final diagnoses:  Acute cystitis with hematuria  Dysuria     Discharge Instructions      Take Keflex antibiotic 2 times daily (with breakfast and dinner) for the next 10 days to treat urinary tract infection.   We sent your urine for culture.  We will call you if we need to change your antibiotic based on the results of the urine culture.  I have drawn some basic blood work today.  We will call you if anything is abnormal.  Decrease amount of coffee, sweet tea, and soda you drink.  Increase the amount of water you drink to stay well-hydrated.  Call your primary care provider to schedule an appointment for the next week or so to discuss your symptoms further as I believe that you would benefit from prostate specific antigen testing and other blood work to investigate for possible prostate abnormalities that we do not perform here urgent care.  If you develop any new or worsening symptoms or do not improve in the next 2 to 3 days, please return.  If your symptoms are severe, please go to the  emergency room.  Follow-up with your primary care provider for further evaluation and management of your symptoms as well as ongoing wellness visits.  I hope you feel better!     ED Prescriptions     Medication Sig Dispense Auth. Provider   nitrofurantoin, macrocrystal-monohydrate, (MACROBID) 100 MG capsule  (Status: Discontinued) Take 1 capsule (100 mg total) by mouth 2 (two) times daily for 7 days. 14 capsule Reita May M, FNP   cephALEXin (KEFLEX) 250 MG capsule  (Status: Discontinued) Take 1 capsule (250 mg total) by mouth 3 (three) times daily for 10 days. 30 capsule Reita May M, FNP   cephALEXin (KEFLEX) 500 MG capsule Take 1 capsule (500 mg total) by mouth 2 (two) times daily for 10 days. 20 capsule Carlisle Beers, FNP      PDMP not reviewed this encounter.   Carlisle Beers, [place] 01/09/22 2208

## 2022-01-06 NOTE — ED Triage Notes (Signed)
Pt c/o urinary incontinence with urgency and burning at times for the past month.

## 2022-01-06 NOTE — Discharge Instructions (Addendum)
Take Keflex antibiotic 2 times daily (with breakfast and dinner) for the next 10 days to treat urinary tract infection.   We sent your urine for culture.  We will call you if we need to change your antibiotic based on the results of the urine culture.  I have drawn some basic blood work today.  We will call you if anything is abnormal.  Decrease amount of coffee, sweet tea, and soda you drink.  Increase the amount of water you drink to stay well-hydrated.  Call your primary care provider to schedule an appointment for the next week or so to discuss your symptoms further as I believe that you would benefit from prostate specific antigen testing and other blood work to investigate for possible prostate abnormalities that we do not perform here urgent care.  If you develop any new or worsening symptoms or do not improve in the next 2 to 3 days, please return.  If your symptoms are severe, please go to the emergency room.  Follow-up with your primary care provider for further evaluation and management of your symptoms as well as ongoing wellness visits.  I hope you feel better!

## 2022-01-08 LAB — URINE CULTURE: Culture: >=100000 — AB

## 2022-03-11 ENCOUNTER — Ambulatory Visit: Payer: Medicare Other | Admitting: Family Medicine

## 2022-04-03 ENCOUNTER — Other Ambulatory Visit: Payer: Self-pay | Admitting: Family Medicine

## 2022-04-07 ENCOUNTER — Other Ambulatory Visit: Payer: Self-pay | Admitting: Family Medicine

## 2022-05-08 DIAGNOSIS — R21 Rash and other nonspecific skin eruption: Secondary | ICD-10-CM | POA: Diagnosis not present

## 2022-05-12 ENCOUNTER — Telehealth: Payer: Self-pay

## 2022-05-12 NOTE — Progress Notes (Signed)
[facility]) [facility] Quality Pharmacy Team Statin Quality Measure Assessment  05/12/2022  [name] Jun 12, 1950 [id number]  Brief Summary:  [facility] Pharmacy team reached out to patient regarding medication adherence for atorvastatin as it was last filled 01/02/2022 for 90 days supply. However, [name] reports having about 25 to 30 tablets remaining and denied missing doses. He states he has a medication stockpile from previous medication refills. Patient has not been seen in clinic since September 2022. He had an appointment in October of this year that was canceled. This pharmacist offered to help schedule an appointment since he has been lost to follow up and patient agreed. Appointment has been scheduled for 05/27/2022 at 3 pm.   Of note, patient has [place] Medicaid Access and is aware of certain limitations with this secondary insurance such referrals to other providers. Clinical manager informed patient about being setup with his Medicaid case worker for additional information as well as discussion of insurance options.   Patient  did state he has lots of family stressors including caring for his brother and sisters needs.  Thank you for allowing [facility] pharmacy to be a part of this patient's care.   Cephus Shelling, PharmD Clinical Pharmacist [facility] Cell: [phone]

## 2022-05-27 ENCOUNTER — Encounter: Payer: Medicare Other | Admitting: Family Medicine

## 2022-05-27 ENCOUNTER — Encounter: Payer: Self-pay | Admitting: Family Medicine

## 2022-05-27 ENCOUNTER — Ambulatory Visit (INDEPENDENT_AMBULATORY_CARE_PROVIDER_SITE_OTHER): Payer: Medicare Other | Admitting: Family Medicine

## 2022-05-27 VITALS — BP 138/86 | HR 79 | Temp 97.5°F | Ht 66.0 in | Wt 233.0 lb

## 2022-05-27 DIAGNOSIS — L249 Irritant contact dermatitis, unspecified cause: Secondary | ICD-10-CM

## 2022-05-27 MED ORDER — PREDNISONE 10 MG (21) PO TBPK: ORAL_TABLET | ORAL | 0 refills | Status: DC

## 2022-05-27 MED ORDER — LEVOCETIRIZINE DIHYDROCHLORIDE 5 MG PO TABS: 5.0000 mg | ORAL_TABLET | Freq: Every evening | ORAL | 0 refills | Status: DC

## 2022-05-27 NOTE — Patient Instructions (Signed)
He is prednisone and Xyzal as needed.

## 2022-05-27 NOTE — Progress Notes (Signed)
Assessment/Plan:   Problem List Items Addressed This Visit   None Visit Diagnoses     Irritant contact dermatitis, unspecified trigger    -  Primary   Relevant Medications   levocetirizine (XYZAL ALLERGY 24HR) 5 MG tablet      Contact dermatitis (ICD-10 L23.9)  Differential diagnosis:  Allergic contact dermatitis (ICD-10 L23.9) The presentation is consistent with an allergic reaction to an external irritant or allergen. The variation in the rash location and response to topical treatments supports this diagnosis. Irritant contact dermatitis (ICD-10 L24.9) Given the nature of the patient's work and his exposure to various possible irritants, this could be an irritant reaction rather than an allergic one. Atopic dermatitis (ICD-10 L20.9) Secondary to the chronic nature and descriptions of spreading, there could be a component of atopic dermatitis. Psoriasis (ICD-10 L40.9) Less likely given the distribution and clinical course, but it should be considered as a differential if the presentation changes. Scabies (ICD-10 B86) This is less likely due to the distribution, mobility of rash, and lack of typical burrows or nocturnal worsening. Plan:  Prescribe a course of oral corticosteroids (e.g., prednisone) to reduce inflammation and provide systemic relief from itching. Recommend over-the-counter antihistamines like cetirizine (Zyrtec) for their anti-itch and anti-inflammatory properties without causing drowsiness. Advise the patient to avoid known irritants and to monitor for any correlation of rash flare-ups with exposure to potential allergens, including new detergents, soaps, or the new dogs. Consider referral to dermatology for further assessment if no improvement with the current plan or if the rash worsens.    Subjective:  HPI:  [name] is a 71 y.o. 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   He  has a past medical history of ACS (acute coronary syndrome) (HCC), Acute anterior wall MI (HCC), Acute blood loss anemia, Acute heart failure (HCC), Acute hypoxemic respiratory failure (HCC), Acute pulmonary edema (HCC), Acute respiratory failure with hypoxia (HCC), Aneurysm of left ventricle of heart (04/12/2017), Anoxic brain injury (HCC), Bacteremia, Benign essential HTN, CAD in native artery, Cardiogenic shock (HCC), CHF (congestive heart failure), NYHA class II, acute, combined (HCC) (04/11/2017), Chronic neck pain, Chronic systolic (congestive) heart failure (HCC), Confusion, Congestive heart failure (CHF) (HCC), COPD (chronic obstructive pulmonary disease) (HCC) (04/11/2017), Coronary artery disease, Dysphagia, Encounter for central line placement, Encounter for management of intra-aortic balloon pump, Essential hypertension, Fever, Hyperglycemia, Hypertension, Hypokalemia, Hypotension due to drugs, Hypoxemia, Labile blood pressure, Leukocytosis, Physical debility (04/27/2017), Rash (05/15/2017), Sepsis (HCC), Sore throat, STEMI (ST elevation myocardial infarction) (HCC) (04/11/2017), Supplemental oxygen dependent, Tachypnea, and 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 Was dx with scabies. ) .    Problem 1: The patient initially  experienced a rash and severe itching, starting on the left lower leg and buttock area about a month ago. The patient reported using over-the-counter anti-itch sprays, chamomile lotion, and Permethrin cream. The rash was thought to be  an allergic reaction or insect bites but has persisted despite various treatments, including Permethrin and Benadryl. He reports the rash seems to be "moving" to different areas, including near the lips, underarms, and legs. Despite treatment with over-the-counter topical treatments, the symptoms have only slightly improved. Given the duration and description, the rash does not appear to be consistent with a typical presentation of scabies, more so with possible contact dermatitis due to exposure to environmental factors or irritants.   Past Surgical History:  Procedure Laterality Date   APPLICATION OF WOUND VAC N/A 04/14/2017   Procedure: APPLICATION OF WOUND VAC with reposition of Impella device;  Surgeon: Delight Ovens, MD;  Location: [facility] OR;  Service: Thoracic;  Laterality: N/A;   CARDIAC CATHETERIZATION     CORONARY STENT INTERVENTION N/A 04/11/2017   Procedure: CORONARY STENT INTERVENTION;  Surgeon: Corky Crafts, MD;  Location: [facility] INVASIVE CV LAB;  Service: Cardiovascular;  Laterality: N/A;   CORONARY THROMBECTOMY N/A 04/11/2017   Procedure: Coronary Thrombectomy;  Surgeon: Corky Crafts, MD;  Location: [facility] INVASIVE CV LAB;  Service: Cardiovascular;  Laterality: N/A;   CORONARY/GRAFT ACUTE MI REVASCULARIZATION N/A 04/11/2017   Procedure: Coronary/Graft Acute MI Revascularization;  Surgeon: Corky Crafts, MD;  Location: [facility] INVASIVE CV LAB;  Service: Cardiovascular;  Laterality: N/A;   IABP INSERTION N/A 04/11/2017   Procedure: IABP Insertion;  Surgeon: Corky Crafts, MD;  Location: [facility]VAS[facility] CV LAB;  Service: Cardiovascular;  Laterality: N/A;   LEFT HEART CATH AND CORONARY ANGIOGRAPHY N/A 04/11/2017   Procedure: LEFT HEART CATH  AND CORONARY ANGIOGRAPHY;  Surgeon: Corky Crafts, MD;  Location: [facility] INVASIVE CV LAB;  Service: Cardiovascular;  Laterality: N/A;   PLACEMENT OF IMPELLA LEFT VENTRICULAR ASSIST DEVICE  04/12/2017   Procedure: PLACEMENT OF IMPELLA  LD;  Surgeon: Delight Ovens, MD;  Location: [facility] OR;  Service: Open Heart Surgery;;   REMOVAL OF IMPELLA LEFT VENTRICULAR ASSIST DEVICE N/A 04/17/2017   Procedure: REMOVAL OF IMPELLA LEFT VENTRICULAR ASSIST DEVICE;  Surgeon: Delight Ovens, MD;  Location: [facility] OR;  Service: Open Heart Surgery;  Laterality: N/A;   REPAIR OF RIGHT VENTRICLE LACERATION Left 04/12/2017   Procedure: Resection of LV Aneurysm;  Surgeon: Delight Ovens, MD;  Location: [facility] OR;  Service: Open Heart Surgery;  Laterality: Left;   STERNAL CLOSURE N/A 04/14/2017   Procedure: STERNAL CLOSURE;  Surgeon: Delight Ovens, MD;  Location: [facility] OR;  Service: Thoracic;  Laterality: N/A;   TEE WITHOUT CARDIOVERSION N/A 04/14/2017   Procedure: TRANSESOPHAGEAL ECHOCARDIOGRAM (TEE);  Surgeon: Delight Ovens, MD;  Location: [facility] OR;  Service: Thoracic;  Laterality: N/A;   ULTRASOUND GUIDANCE FOR VASCULAR ACCESS  04/11/2017   Procedure: Ultrasound Guidance For Vascular Access;  Surgeon: Corky Crafts, MD;  Location: [facility] INVASIVE CV LAB;  Service: Cardiovascular;;    Outpatient Medications Prior to Visit  Medication Sig Dispense Refill   atorvastatin (LIPITOR) 80 MG tablet TAKE 1 TABLET (80 MG) BY ORAL ROUTE ONCE DAILY, AT 6PM 90 tablet 3   carvedilol (COREG) 3.125 MG tablet TAKE 1 TABLET BY MOUTH TWICE A DAY WITH FOOD 180 tablet 3   Cholecalciferol (VITAMIN D3) 125 MCG (5000 UT) CAPS Take by mouth.     clopidogrel (PLAVIX) 75 MG tablet TAKE 1 TABLET BY MOUTH EVERY DAY 90 tablet 3   cyclobenzaprine (FLEXERIL) 5 MG tablet TAKE 1 TABLET BY MOUTH 2 TIMES DAILY AS NEEDED FOR MUSCLE SPASMS. 60 tablet 0   ENTRESTO 24-26 MG  TAKE 1 TABLET BY MOUTH TWICE A DAY 60 tablet 11   Iron, Ferrous Sulfate,  325 (65 Fe) MG TABS Take 325 mg by mouth daily. 180 tablet 1   Multiple Vitamins-Minerals (MULTIVITAMIN WITH MINERALS) tablet Take 1 tablet by mouth daily.     permethrin (ELIMITE) 5 % cream Apply topically once.     clindamycin (CLEOCIN) 300 MG capsule Take 1 capsule (300 mg total) by mouth 3 (three) times daily. 30 capsule 0   aspirin 81 MG chewable tablet Chew 81 mg by mouth daily. (Patient not taking: Reported on 05/27/2022)     No facility-administered medications prior to visit.    Family History  Problem Relation Age of Onset   Esophageal cancer Mother     Social History   Socioeconomic History   Marital status: Divorced    Spouse name: Not on file   Number of children: Not on file   Years of education: 14   Highest education level: Associate degree: occupational, Scientist, product/process development, or vocational program  Occupational History   Occupation: Retired  Tobacco Use   Smoking status: Former    Packs/day: 1.50    Years: 53.00    Total pack years: 79.50    Types: Cigarettes    Quit date: 04/11/2017    Years since quitting: 5.1    Passive exposure: Never   Smokeless tobacco: Never  Vaping Use   Vaping Use: Never used  Substance and Sexual Activity   Alcohol use: Not Currently   Drug use: Never   Sexual activity: Not Currently  Other Topics Concern   Not on file  Social History Narrative   Not on file   Social Determinants of Health   Financial Resource Strain: Low Risk  (01/31/2021)   Overall Financial Resource Strain (CARDIA)    Difficulty of Paying Living Expenses: Not hard at all  Food Insecurity: Unknown (01/31/2021)   Hunger Vital Sign    Worried About Running Out of Food in the Last Year: Patient refused    Ran Out of Food in the Last Year: Patient refused  Transportation Needs: No Transportation Needs (01/31/2021)   PRAPARE - Administrator, Civil Service (Medical): No    Lack of Transportation (Non-Medical): No  Physical Activity: Unknown (01/31/2021)    Exercise Vital Sign    Days of Exercise per Week: Patient refused    Minutes of Exercise per Session: Patient refused  Stress: No Stress Concern Present (01/31/2021)   [employer] of Occupational Health - Occupational Stress Questionnaire    Feeling of Stress : Not at all  Social Connections: Moderately Isolated (01/31/2021)   Social Connection and Isolation Panel [NHANES]    Frequency of Communication with Friends and Family: More than three times a week    Frequency of Social Gatherings with Friends and Family: More than three times a week    Attends Religious Services: More than 4 times per year    Active Member of [employer] or Organizations: No    Attends Banker Meetings: Never    Marital Status: Divorced  Catering manager Violence: Not At Risk (01/31/2021)   Humiliation, Afraid, Rape, and Kick questionnaire    Fear of Current or Ex-Partner: No    Emotionally Abused: No    Physically Abused: No    Sexually Abused: No  Objective:  Physical Exam: BP 138/86 (BP Location: Left Arm, Patient Position: Sitting, Cuff Size: Large)   Pulse 79   Temp (!) 97.5 F (36.4 C) (Temporal)   Ht 5\' 6"  (1.676 m)   Wt 233 lb (105.7 kg)   SpO2 97%   BMI 37.61 kg/m    General: No acute distress. Awake and conversant.  Eyes: Normal conjunctiva, anicteric. Round symmetric pupils.  ENT: Hearing grossly intact. No nasal discharge.  Neck: Neck is supple. No masses or thyromegaly.  Respiratory: Respirations are non-labored. No auditory wheezing.  Skin: Warm.  Examination shows localized areas of redness and irritation without the presence of scabies characteristics. Psych: Alert and oriented. Cooperative, Appropriate mood and affect, Normal judgment.  CV: No cyanosis or JVD MSK: Normal ambulation. No clubbing  Neuro: Sensation and CN II-XII grossly normal.        Garner NashAaron Bradley Allegra Cerniglia,  MD, MS

## 2022-06-06 ENCOUNTER — Ambulatory Visit (HOSPITAL_COMMUNITY)
Admission: EM | Admit: 2022-06-06 | Discharge: 2022-06-06 | Disposition: A | Discharge status: Home / Self Care | Payer: Medicare Other | Attending: Physician Assistant | Admitting: Physician Assistant

## 2022-06-06 ENCOUNTER — Encounter (HOSPITAL_COMMUNITY): Payer: Self-pay

## 2022-06-06 ENCOUNTER — Telehealth: Payer: Self-pay | Admitting: General Practice

## 2022-06-06 ENCOUNTER — Ambulatory Visit (INDEPENDENT_AMBULATORY_CARE_PROVIDER_SITE_OTHER): Payer: Medicare Other

## 2022-06-06 DIAGNOSIS — Z87891 Personal history of nicotine dependence: Secondary | ICD-10-CM | POA: Diagnosis not present

## 2022-06-06 DIAGNOSIS — J40 Bronchitis, not specified as acute or chronic: Secondary | ICD-10-CM

## 2022-06-06 DIAGNOSIS — R0989 Other specified symptoms and signs involving the circulatory and respiratory systems: Secondary | ICD-10-CM

## 2022-06-06 DIAGNOSIS — J329 Chronic sinusitis, unspecified: Secondary | ICD-10-CM

## 2022-06-06 DIAGNOSIS — I5022 Chronic systolic (congestive) heart failure: Secondary | ICD-10-CM | POA: Diagnosis not present

## 2022-06-06 DIAGNOSIS — R9389 Abnormal findings on diagnostic imaging of other specified body structures: Secondary | ICD-10-CM

## 2022-06-06 DIAGNOSIS — Z8679 Personal history of other diseases of the circulatory system: Secondary | ICD-10-CM

## 2022-06-06 DIAGNOSIS — R059 Cough, unspecified: Secondary | ICD-10-CM | POA: Diagnosis not present

## 2022-06-06 DIAGNOSIS — I110 Hypertensive heart disease with heart failure: Secondary | ICD-10-CM | POA: Diagnosis not present

## 2022-06-06 DIAGNOSIS — J811 Chronic pulmonary edema: Secondary | ICD-10-CM | POA: Diagnosis not present

## 2022-06-06 LAB — CBC WITH DIFFERENTIAL/PLATELET
Abs Immature Granulocytes: 0.05 10*3/uL (ref 0.00–0.07)
Basophils Absolute: 0.1 10*3/uL (ref 0.0–0.1)
Basophils Relative: 0 %
Eosinophils Absolute: 0.3 10*3/uL (ref 0.0–0.5)
Eosinophils Relative: 2 %
HCT: 42.3 % (ref 39.0–52.0)
Hemoglobin: 13.2 g/dL (ref 13.0–17.0)
Immature Granulocytes: 0 %
Lymphocytes Relative: 15 %
Lymphs Abs: 1.9 10*3/uL (ref 0.7–4.0)
MCH: 27.9 pg (ref 26.0–34.0)
MCHC: 31.2 g/dL (ref 30.0–36.0)
MCV: 89.4 fL (ref 80.0–100.0)
Monocytes Absolute: 1.4 10*3/uL — ABNORMAL HIGH (ref 0.1–1.0)
Monocytes Relative: 11 %
Neutro Abs: 9.1 10*3/uL — ABNORMAL HIGH (ref 1.7–7.7)
Neutrophils Relative %: 72 %
Platelets: 263 10*3/uL (ref 150–400)
RBC: 4.73 MIL/uL (ref 4.22–5.81)
RDW: 13.9 % (ref 11.5–15.5)
WBC: 12.7 10*3/uL — ABNORMAL HIGH (ref 4.0–10.5)
nRBC: 0 % (ref 0.0–0.2)

## 2022-06-06 LAB — COMPREHENSIVE METABOLIC PANEL
ALT: 15 U/L (ref 0–44)
AST: 16 U/L (ref 15–41)
Albumin: 3.1 g/dL — ABNORMAL LOW (ref 3.5–5.0)
Alkaline Phosphatase: 56 U/L (ref 38–126)
Anion gap: 8 (ref 5–15)
BUN: 13 mg/dL (ref 8–23)
CO2: 29 mmol/L (ref 22–32)
Calcium: 9 mg/dL (ref 8.9–10.3)
Chloride: 99 mmol/L (ref 98–111)
Creatinine, Ser: 1.13 mg/dL (ref 0.61–1.24)
GFR, Estimated: >60 mL/min (ref >60)
Glucose, Bld: 101 mg/dL — ABNORMAL HIGH (ref 70–99)
Potassium: 4.4 mmol/L (ref 3.5–5.1)
Sodium: 136 mmol/L (ref 135–145)
Total Bilirubin: 0.8 mg/dL (ref 0.3–1.2)
Total Protein: 6.8 g/dL (ref 6.5–8.1)

## 2022-06-06 LAB — BRAIN NATRIURETIC PEPTIDE: B Natriuretic Peptide: 94.2 pg/mL (ref 0.0–100.0)

## 2022-06-06 MED ORDER — AMOXICILLIN-POT CLAVULANATE 875-125 MG PO TABS: 1.0000 | ORAL_TABLET | Freq: Two times a day (BID) | ORAL | 0 refills | Status: DC

## 2022-06-06 NOTE — Telephone Encounter (Signed)
Pt called and want to know if dr Grandville Silos will see his sister on a mychart video because pt sister is bed ridden and other issues . Pt said call him and discuss with him . Pt said his sister name is catherine Astronomer

## 2022-06-06 NOTE — Discharge Instructions (Addendum)
Start Augmentin twice daily for 7 days.  Use over-the-counter medication including Mucinex and Flonase.  I will contact you if your lab work is abnormal.  Follow-up with your primary care as soon as possible.  If you develop any shortness of breath, worsening cough, weakness, nausea/vomiting, swelling in your legs you need to be seen immediately.

## 2022-06-06 NOTE — ED Triage Notes (Signed)
Pt c/o productive cough with yellow/gray in color x1wk. States having pain to back and chest from coughing hard. States taking OTC meds with no relief.

## 2022-06-06 NOTE — ED Provider Notes (Signed)
[facility]    CSN: [id number] Arrival date & time: 06/06/22  0840      History   Chief Complaint Chief Complaint  Patient presents with   Cough    HPI [name] is a 72 y.o. male.   Patient presents today with a 10-day history of URI symptoms including productive cough.  Reports some nasal congestion but denies any fever, chest pain, shortness of breath, nausea/vomiting interfere with oral intake.  He does have a history of cardiovascular disease but denies any history of asthma.  He does have a history of COPD and is a former smoker who quit several years ago.  He has not been taking albuterol or any inhalers to help manage his symptoms.  Reports his sister has been sick with similar symptoms.  Denies any recent antibiotics or steroids; was treated for UTI several months ago but does not remember the name of this medication.    Past Medical History:  Diagnosis Date   ACS (acute coronary syndrome) (HCC)    Acute anterior wall MI (HCC)    Acute blood loss anemia    Acute heart failure (HCC)    Acute hypoxemic respiratory failure (HCC)    Acute pulmonary edema (HCC)    Acute respiratory failure with hypoxia (HCC)    Aneurysm of left ventricle of heart 04/12/2017   Anoxic brain injury (HCC)    Bacteremia    Benign essential HTN    CAD in native artery    Cardiogenic shock (HCC)    CHF (congestive heart failure), NYHA class II, acute, combined (HCC) 04/11/2017   Chronic neck pain    Chronic systolic (congestive) heart failure (HCC)    Confusion    Congestive heart failure (CHF) (HCC)    COPD (chronic obstructive pulmonary disease) (HCC) 04/11/2017   Coronary artery disease    Dysphagia    Encounter for central line placement    Encounter for management of intra-aortic balloon pump    Essential hypertension    Fever    Hyperglycemia    Hypertension    Hypokalemia    Hypotension due to drugs    Hypoxemia    Labile blood pressure    Leukocytosis     Physical debility 04/27/2017   Rash 05/15/2017   Sepsis (HCC)    Sore throat    STEMI (ST elevation myocardial infarction) (HCC) 04/11/2017   Supplemental oxygen dependent    Tachypnea    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 [facility])     Patient Active Problem List   Diagnosis Date Noted   History of tobacco use 02/18/2021   Muscle cramps 02/18/2021   Well adult exam 03/06/2019   Penile discharge 09/26/2018   Need for hepatitis C screening test 10/06/2017   Elevated serum creatinine 10/06/2017   Iron deficiency anemia 10/06/2017   Essential hypertension    Confusion    Anoxic brain injury (HCC)    Chronic systolic (congestive) heart failure (HCC)    CAD in native artery    Physical debility 04/27/2017   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 [facility])    Congestive heart failure (CHF) (HCC)    Dysphagia    Chronic neck pain    Aneurysm of left ventricle of heart 04/12/2017   Encounter for screening colonoscopy    STEMI (ST elevation myocardial infarction) (HCC) 04/11/2017   CHF (congestive heart failure), NYHA class II, acute, combined (HCC) 04/11/2017    Past Surgical History:  Procedure Laterality Date   APPLICATION OF WOUND VAC N/A 04/14/2017   Procedure: APPLICATION OF WOUND VAC with reposition of Impella device;  Surgeon: Grace Isaac, MD;  Location: [facility] OR;  Service: Thoracic;  Laterality: N/A;   CARDIAC CATHETERIZATION     CORONARY STENT INTERVENTION N/A 04/11/2017   Procedure: CORONARY STENT INTERVENTION;  Surgeon: Jettie Booze, MD;  Location: [place] CV LAB;  Service: Cardiovascular;  Laterality: N/A;   CORONARY THROMBECTOMY N/A 04/11/2017   Procedure: Coronary Thrombectomy;  Surgeon: Jettie Booze, MD;  Location: [place] [facility] LAB;  Service: Cardiovascular;  Laterality: N/A;   CORONARY/GRAFT ACUTE MI REVASCULARIZATION N/A 04/11/2017   Procedure: Coronary/Graft Acute MI  Revascularization;  Surgeon: Jettie Booze, MD;  Location: [place] [facility] LAB;  Service: Cardiovascular;  Laterality: N/A;   IABP INSERTION N/A 04/11/2017   Procedure: IABP Insertion;  Surgeon: Jettie Booze, MD;  Location: [place] [facility] LAB;  Service: Cardiovascular;  Laterality: N/A;   LEFT HEART CATH AND CORONARY ANGIOGRAPHY N/A 04/11/2017   Procedure: LEFT HEART CATH AND CORONARY ANGIOGRAPHY;  Surgeon: Jettie Booze, MD;  Location: [place] [facility] LAB;  Service: Cardiovascular;  Laterality: N/A;   PLACEMENT OF IMPELLA LEFT VENTRICULAR ASSIST DEVICE  04/12/2017   Procedure: PLACEMENT OF IMPELLA  LD;  Surgeon: Grace Isaac, MD;  Location: [place];  Service: Open Heart Surgery;;   REMOVAL OF IMPELLA LEFT VENTRICULAR ASSIST DEVICE N/A 04/17/2017   Procedure: REMOVAL OF IMPELLA LEFT VENTRICULAR ASSIST DEVICE;  Surgeon: Grace Isaac, MD;  Location: [place];  Service: Open Heart Surgery;  Laterality: N/A;   REPAIR OF RIGHT VENTRICLE LACERATION Left 04/12/2017   Procedure: Resection of LV Aneurysm;  Surgeon: Grace Isaac, MD;  Location: [place];  Service: Open Heart Surgery;  Laterality: Left;   STERNAL CLOSURE N/A 04/14/2017   Procedure: STERNAL CLOSURE;  Surgeon: Grace Isaac, MD;  Location: [place];  Service: Thoracic;  Laterality: N/A;   TEE WITHOUT CARDIOVERSION N/A 04/14/2017   Procedure: TRANSESOPHAGEAL ECHOCARDIOGRAM (TEE);  Surgeon: Grace Isaac, MD;  [facility] OR;  Service: Thoracic;  Laterality: N/A;   ULTRASOUND GUIDANCE FOR VASCULAR ACCESS  04/11/2017   Procedure: Ultrasound Guidance For Vascular Access;  Surgeon: Jettie Booze, MD;  Location: [place] [facility] LAB;  Service: Cardiovascular;;       Home Medications    Prior to Admission medications   Medication Sig Start Date End Date Taking? Authorizing Provider  amoxicillin-clavulanate (AUGMENTIN) 875-125 MG tablet Take 1 tablet by mouth every 12 (twelve) hours. 06/06/22  Yes  Elva Breaker, Derry Skill, PA-C  aspirin 81 MG chewable tablet Chew 81 mg by mouth daily. Patient not taking: Reported on 05/27/2022    [provider]  atorvastatin (LIPITOR) 80 MG tablet TAKE 1 TABLET (80 MG) BY ORAL ROUTE ONCE DAILY, AT [facility]enty 02/24/21   Libby Maw, MD  carvedilol (COREG) 3.125 MG tablet TAKE 1 TABLET BY MOUTH TWICE A DAY WITH FOOD 12/01/21   Jettie Booze, MD  Cholecalciferol (VITAMIN D3) 125 MCG (5000 UT) CAPS Take by mouth.    [provider]  clopidogrel (PLAVIX) 75 MG tablet TAKE 1 TABLET BY MOUTH EVERY DAY 12/21/21   Jettie Booze, MD  cyclobenzaprine (FLEXERIL) 5 MG tablet TAKE 1 TABLET BY MOUTH 2 TIMES DAILY AS NEEDED FOR MUSCLE SPASMS. 08/12/20   Cirigliano, Mary K, DO  ENTRESTO 24-26 MG TAKE 1 TABLET BY MOUTH TWICE A DAY 10/22/21  Corky Crafts, MD  Iron, Ferrous Sulfate, 325 (65 Fe) MG TABS Take 325 mg by mouth daily. 02/19/21   Mliss Sax, MD  levocetirizine Elita Boone ALLERGY 24HR) 5 MG tablet Take 1 tablet (5 mg total) by mouth every evening. 05/27/22 06/26/22  Garnette Gunner, MD  Multiple Vitamins-Minerals (MULTIVITAMIN WITH MINERALS) tablet Take 1 tablet by mouth daily.    [provider]  permethrin (ELIMITE) 5 % cream Apply topically once. 05/08/22   [provider]    Family History Family History  Problem Relation Age of Onset   Esophageal cancer Mother     Social History Social History   Tobacco Use   Smoking status: Former    Packs/day: 1.50    Years: 53.00    Total pack years: 79.50    Types: Cigarettes    Quit date: 04/11/2017    Years since quitting: 5.1    Passive exposure: Never   Smokeless tobacco: Never  Vaping Use   Vaping Use: Never used  Substance Use Topics   Alcohol use: Not Currently   Drug use: Never     Allergies   Other and Tomato   Review of Systems Review of Systems  Constitutional:  Positive for activity change. Negative for appetite change, fatigue  and fever.  HENT:  Positive for congestion. Negative for sinus pressure, sneezing and sore throat.   Respiratory:  Positive for cough and shortness of breath.   Cardiovascular:  Negative for chest pain.  Gastrointestinal:  Negative for abdominal pain, diarrhea, nausea and vomiting.  Neurological:  Negative for dizziness, light-headedness and headaches.     Physical Exam Triage Vital Signs ED Triage Vitals  Enc Vitals Group     BP 06/06/22 0954 (!) 155/75     Pulse Rate 06/06/22 0951 91     Resp 06/06/22 0951 20     Temp 06/06/22 0951 98.6 F (37 C)     Temp src --      SpO2 06/06/22 0951 93 %     Weight --      Height --      Head Circumference --      Peak Flow --      Pain Score 06/06/22 0953 7     Pain Loc --      Pain Edu? --      Excl. in GC? --    No data found.  Updated Vital Signs BP (!) 155/75 (BP Location: Left Arm)   Pulse 91   Temp 98.6 F (37 C)   Resp 20   SpO2 93%   Visual Acuity Right Eye Distance:   Left Eye Distance:   Bilateral Distance:    Right Eye Near:   Left Eye Near:    Bilateral Near:     Physical Exam Vitals reviewed.  Constitutional:      General: He is awake.     Appearance: Normal appearance. He is well-developed. He is not ill-appearing.     Comments: Very pleasant male appears stated age in no acute distress sitting comfortably in exam room  HENT:     Head: Normocephalic and atraumatic.     Right Ear: Tympanic membrane, ear canal and external ear normal. Tympanic membrane is not erythematous or bulging.     Left Ear: Tympanic membrane, ear canal and external ear normal. Tympanic membrane is not erythematous or bulging.     Nose: Nose normal.     Mouth/Throat:     Dentition: Abnormal  dentition.     Pharynx: Uvula midline. Posterior oropharyngeal erythema present. No oropharyngeal exudate.  Cardiovascular:     Rate and Rhythm: Normal rate and regular rhythm.     Heart sounds: Normal heart sounds, S1 normal and S2 normal. No  murmur heard. Pulmonary:     Effort: Pulmonary effort is normal. No accessory muscle usage or respiratory distress.     Breath sounds: No stridor. Examination of the left-lower field reveals rhonchi. Rhonchi present. No wheezing or rales.  Abdominal:     General: Bowel sounds are normal.     Palpations: Abdomen is soft.     Tenderness: There is no abdominal tenderness.  Musculoskeletal:     Right lower leg: No edema.     Left lower leg: No edema.  Neurological:     Mental Status: He is alert.  Psychiatric:        Behavior: Behavior is cooperative.      UC Treatments / Results  Labs (all labs ordered are listed, but only abnormal results are displayed) Labs Reviewed  CBC WITH DIFFERENTIAL/PLATELET  COMPREHENSIVE METABOLIC PANEL  BRAIN NATRIURETIC PEPTIDE    EKG   Radiology DG Chest 2 View  Result Date: 06/06/2022 CLINICAL DATA:  Productive cough. EXAM: CHEST - 2 VIEW COMPARISON:  09/27/2017 FINDINGS: Status post median sternotomy. Heart is mildly enlarged but stable in configuration. There is pulmonary venous congestion without overt alveolar edema. No definite pleural effusions or consolidations. IMPRESSION: Pulmonary venous congestion without overt alveolar edema. Electronically Signed   By: Norva Pavlov M.D.   On: 06/06/2022 10:43    Procedures Procedures (including critical care time)  Medications Ordered in UC Medications - No data to display  Initial Impression / Assessment and Plan / UC Course  I have reviewed the triage vital signs and the nursing notes.  Pertinent labs & imaging results that were available during my care of the patient were reviewed by me and considered in my medical decision making (see chart for details).     Chest x-ray was obtained that showed some pulmonary congestion but is otherwise normal.  Patient does not have any significant anemia and is not currently prescribed any Lasix/furosemide.  Low suspicion for acute heart failure given  clinical presentation but will obtain BNP, however, if this is elevated we will consider initiation of loop diuretic.  CMP and CBC were also obtained and if he has significant change in kidney function or significant leukocytosis will need to go to the emergency room given his age.  Will start Augmentin to cover for infectious etiology given clinical presentation.  No indication for dose adjustment of antibiotics based on 01/06/2022 metabolic panel with creatinine of 1.30 and calculated creatinine clearance of 75.650 mL/min.  Recommended close follow-up with his primary care.  Discussed that if his symptoms worsen anyway and he develops shortness of breath, leg swelling, lightheadedness, chest pain, nausea/vomiting he needs to be seen immediately.  Strict return precautions given.   Final Clinical Impressions(s) / UC Diagnoses   Final diagnoses:  Sinobronchitis  Pulmonary congestion  Abnormal chest x-ray  History of heart failure     Discharge Instructions      Start Augmentin twice daily for 7 days.  Use over-the-counter medication including Mucinex and Flonase.  I will contact you if your lab work is abnormal.  Follow-up with your primary care as soon as possible.  If you develop any shortness of breath, worsening cough, weakness, nausea/vomiting, swelling in your legs you need to  be seen immediately.     ED Prescriptions     Medication Sig Dispense Auth. Provider   amoxicillin-clavulanate (AUGMENTIN) 875-125 MG tablet Take 1 tablet by mouth every 12 (twelve) hours. 14 tablet Filemon Breton, Noberto Retort, PA-C      PDMP not reviewed this encounter.   Jeani Hawking, PA-C 06/06/22 1106

## 2022-06-07 ENCOUNTER — Ambulatory Visit (INDEPENDENT_AMBULATORY_CARE_PROVIDER_SITE_OTHER): Payer: Medicare Other | Admitting: Family Medicine

## 2022-06-07 ENCOUNTER — Encounter: Payer: Self-pay | Admitting: Family Medicine

## 2022-06-07 VITALS — BP 120/78 | HR 71 | Temp 97.1°F | Wt 230.0 lb

## 2022-06-07 DIAGNOSIS — M25562 Pain in left knee: Secondary | ICD-10-CM

## 2022-06-07 MED ORDER — DICLOFENAC SODIUM 1 % EX GEL: 4.0000 g | Freq: Four times a day (QID) | CUTANEOUS | 3 refills | Status: DC | PRN

## 2022-06-07 MED ORDER — TRAMADOL HCL 50 MG PO TABS: 50.0000 mg | ORAL_TABLET | Freq: Three times a day (TID) | ORAL | 0 refills | Status: AC | PRN

## 2022-06-07 NOTE — Progress Notes (Signed)
Assessment/Plan:   Problem List Items Addressed This Visit   None Visit Diagnoses     Acute pain of left knee    -  Primary   Relevant Medications   traMADol (ULTRAM) 50 MG tablet   diclofenac Sodium (VOLTAREN) 1 % GEL   Other Relevant Orders   DG Knee Complete 4 Views Left   Ambulatory referral to Orthopedics      The patient presents with left knee pain, swelling, and tenderness, which is most likely secondary to an acute injury or strain possibly related to his occupation. Given the physical examination and history, inflammatory arthritis, meniscal tear, or ligamentous injury should be considered.  Differential diagnosis:  Meniscal Injury: Considering the location of pain and swelling, a tear in the meniscus could be responsible. Ligament Sprain: Symptoms may suggest a ligament strain or sprain, particularly if the pain occurred gradually without a traumatic event. Osteoarthritis Flare: Given the patient's occupational risk factors, a flare of osteoarthritis could present with these symptoms. Bursitis: Inflammation of the bursa around the knee joint can cause pain and swelling in the affected area.  Plan:  Obtain plain radiographs (X-ray) of the left knee to evaluate for degenerative changes, fractures, or other bony abnormalities. Prescribe tramadol for pain relief, considering patient's history possibly contraindicating NSAIDs use. Prescribe topical Voltaren gel to be applied to the affected area which might provide localized pain relief without systemic side effects. Recommend resting the knee, using ice and elevation to reduce swelling, and gradually increasing movement as tolerated. Follow-up appointment and review of X-ray results, refer to an orthopedic specialist if necessary for further evaluation.    Subjective:  Chief Complaint: [name] presents with left knee pain, located on the medial side, described as swollen and tender, with a duration of approximately two to  three weeks.  History of Present Illness:  Problem 1: [name] reports left knee pain that has persisted for about two to three weeks. He describes the pain as localized to the medial side and experiences tenderness above and below the knee. There is notable swelling and pain exacerbates with movement. The patient mentions difficulty walking and needing to shift weight cautiously when standing up. [name] has not sought any form of treatment for this knee pain prior to this visit, although he did contemplate over-the-counter remedies like Icy Hot. The pain seems to worsen over time, and [name] denies any specific injury but speculates a possible accidental injury such as turning over quickly in bed. Activities that worsen the pain include walking and transitioning from sitting to standing. 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  Problem 2: [name] also addresses a recent upper respiratory infection with a severe cough that causes chest and back pain, particularly on the right side. He recently had an evaluation at an urgent care facility for this issue and is following a prescribed treatment plan.  Review of Systems: Musculoskeletal: Patient admits to left knee pain and swelling. Respiratory: Patient complains of a persistent cough with associated chest and back pain. Skin: No current rashes or ulcers reported; previous rash in the buttock and leg areas has resolved since last visit. All other systems are reviewed and are negative.   Past Surgical History:  Procedure Laterality Date   APPLICATION OF WOUND VAC N/A 04/14/2017   Procedure: APPLICATION OF WOUND VAC with reposition of Impella device;  Surgeon: Delight Ovens, MD;  Location: [facility] OR;  Service: Thoracic;  Laterality: N/A;  CARDIAC CATHETERIZATION     CORONARY STENT INTERVENTION N/A 04/11/2017   Procedure: CORONARY STENT INTERVENTION;  Surgeon: Jettie Booze, MD;  Location: [place] [facility] LAB;  Service: Cardiovascular;  Laterality: N/A;   CORONARY THROMBECTOMY N/A 04/11/2017   Procedure: Coronary Thrombectomy;  Surgeon: Jettie Booze, MD;  Location: [place] [facility] LAB;  Service: Cardiovascular;  Laterality: N/A;   CORONARY/GRAFT ACUTE MI REVASCULARIZATION N/A 04/11/2017   Procedure: Coronary/Graft Acute MI Revascularization;  Surgeon: Jettie Booze, MD;  Location: [place] [facility] LAB;  Service: Cardiovascular;  Laterality: N/A;   IABP INSERTION N/A 04/11/2017   Procedure: IABP Insertion;  Surgeon: Jettie Booze, MD;  Location: [place] [facility] LAB;  Service: Cardiovascular;  Laterality: N/A;   LEFT HEART CATH AND CORONARY ANGIOGRAPHY N/A 04/11/2017   Procedure: LEFT HEART CATH AND CORONARY ANGIOGRAPHY;  Surgeon: Jettie Booze, MD;  Location: [place] [facility] LAB;  Service: Cardiovascular;  Laterality: N/A;   PLACEMENT OF IMPELLA LEFT VENTRICULAR ASSIST DEVICE  04/12/2017   Procedure: PLACEMENT OF IMPELLA  LD;  Surgeon: Grace Isaac, MD;  Location: [place];  Service: Open Heart Surgery;;   REMOVAL OF IMPELLA LEFT VENTRICULAR ASSIST DEVICE N/A 04/17/2017   Procedure: REMOVAL OF IMPELLA LEFT VENTRICULAR ASSIST DEVICE;  Surgeon: Grace Isaac, MD;  Location: [place];  Service: Open Heart Surgery;  Laterality: N/A;   REPAIR OF RIGHT VENTRICLE LACERATION Left 04/12/2017   Procedure: Resection of LV Aneurysm;  Surgeon: Grace Isaac, MD;  Location: [place];  Service: Open Heart Surgery;  Laterality: Left;   STERNAL CLOSURE N/A 04/14/2017   Procedure: STERNAL CLOSURE;  Surgeon: Grace Isaac, MD;  Location: [place];  Service: Thoracic;  Laterality: N/A;   TEE WITHOUT CARDIOVERSION N/A 04/14/2017   Procedure: TRANSESOPHAGEAL ECHOCARDIOGRAM (TEE);  Surgeon: Grace Isaac, MD;  Location: [facility] OR;  Service: Thoracic;  Laterality: N/A;   ULTRASOUND GUIDANCE FOR VASCULAR ACCESS  04/11/2017   Procedure: Ultrasound Guidance For Vascular Access;   Surgeon: Jettie Booze, MD;  Location: [place] CV LAB;  Service: Cardiovascular;;    Outpatient Medications Prior to Visit  Medication Sig Dispense Refill   amoxicillin-clavulanate (AUGMENTIN) 875-125 MG tablet Take 1 tablet by mouth every 12 (twelve) hours. 14 tablet 0   atorvastatin (LIPITOR) 80 MG tablet TAKE 1 TABLET (80 MG) BY ORAL ROUTE ONCE DAILY, AT 6PM 90 tablet 3   carvedilol (COREG) 3.125 MG tablet TAKE 1 TABLET BY MOUTH TWICE A DAY WITH FOOD 180 tablet 3   Cholecalciferol (VITAMIN D3) 125 MCG (5000 UT) CAPS Take by mouth.     clopidogrel (PLAVIX) 75 MG tablet TAKE 1 TABLET BY MOUTH EVERY DAY 90 tablet 3   ENTRESTO 24-26 MG TAKE 1 TABLET BY MOUTH TWICE A DAY 60 tablet 11   Iron, Ferrous Sulfate, 325 (65 Fe) MG TABS Take 325 mg by mouth daily. 180 tablet 1   levocetirizine (XYZAL ALLERGY 24HR) 5 MG tablet Take 1 tablet (5 mg total) by mouth every evening. 30 tablet 0   Multiple Vitamins-Minerals (MULTIVITAMIN WITH MINERALS) tablet Take 1 tablet by mouth daily.     permethrin (ELIMITE) 5 % cream Apply topically once.     aspirin 81 MG chewable tablet Chew 81 mg by mouth daily. (Patient not taking: Reported on 05/27/2022)     cyclobenzaprine (FLEXERIL) 5 MG tablet TAKE 1 TABLET BY MOUTH 2 TIMES DAILY AS NEEDED FOR MUSCLE SPASMS. (Patient not taking: Reported on 06/07/2022) 60 tablet  0   No facility-administered medications prior to visit.    Family History  Problem Relation Age of Onset   Esophageal cancer Mother     Social History   Socioeconomic History   Marital status: Divorced    Spouse name: Not on file   Number of children: Not on file   Years of education: 14   Highest education level: Associate degree: occupational, Scientist, product/process development, or vocational program  Occupational History   Occupation: Retired  Tobacco Use   Smoking status: Former    Packs/day: 1.50    Years: 53.00    Total pack years: 79.50    Types: Cigarettes    Quit date: 04/11/2017    Years  since quitting: 5.1    Passive exposure: Never   Smokeless tobacco: Never  Vaping Use   Vaping Use: Never used  Substance and Sexual Activity   Alcohol use: Not Currently   Drug use: Never   Sexual activity: Not Currently  Other Topics Concern   Not on file  Social History Narrative   Not on file   Social Determinants of Health   Financial Resource Strain: Low Risk  (01/31/2021)   Overall Financial Resource Strain (CARDIA)    Difficulty of Paying Living Expenses: Not hard at all  Food Insecurity: Unknown (01/31/2021)   Hunger Vital Sign    Worried About Running Out of Food in the Last Year: Patient refused    Ran Out of Food in the Last Year: Patient refused  Transportation Needs: No Transportation Needs (01/31/2021)   PRAPARE - Administrator, Civil Service (Medical): No    Lack of Transportation (Non-Medical): No  Physical Activity: Unknown (01/31/2021)   Exercise Vital Sign    Days of Exercise per Week: Patient refused    Minutes of Exercise per Session: Patient refused  Stress: No Stress Concern Present (01/31/2021)   [employer] of Occupational Health - Occupational Stress Questionnaire    Feeling of Stress : Not at all  Social Connections: Moderately Isolated (01/31/2021)   Social Connection and Isolation Panel [NHANES]    Frequency of Communication with Friends and Family: More than three times a week    Frequency of Social Gatherings with Friends and Family: More than three times a week    Attends Religious Services: More than 4 times per year    Active Member of [employer] or Organizations: No    Attends Banker Meetings: Never    Marital Status: Divorced  Catering manager Violence: Not At Risk (01/31/2021)   Humiliation, Afraid, Rape, and Kick questionnaire    Fear of Current or Ex-Partner: No    Emotionally Abused: No    Physically Abused: No    Sexually Abused: No                                                                                                  Objective:  Physical Exam: BP 120/78 (BP Location: Left Arm, Patient Position: Sitting, Cuff Size: Large)   Pulse 71   Temp (!) 97.1 F (36.2 C) (Temporal)   Wt 230  lb (104.3 kg)   SpO2 98%   BMI 37.12 kg/m    General: No acute distress. Awake and conversant.  Eyes: Normal conjunctiva, anicteric. Round symmetric pupils.  ENT: Hearing grossly intact. No nasal discharge.  Neck: Neck is supple. No masses or thyromegaly.  Respiratory: Respirations are non-labored. No auditory wheezing.  Skin: Warm. No rashes or ulcers.  Psych: Alert and oriented. Cooperative, Appropriate mood and affect, Normal judgment.  CV: No cyanosis or JVD MSK:  No clubbing,  Swelling noted on the medial side of the left knee. Tenderness upon palpation. Difficulty ambulating as per patient's history. Neuro: Sensation and CN II-XII grossly normal.        Garner Nash, MD, MS

## 2022-06-07 NOTE — Patient Instructions (Signed)
For knee xray, go to:    [facility], [address], [place], [place] [address] Phone: [phone]  Use Voltaren gel and tramadol as needed for pain.  You may also continue wearing the brace  We are referring to [facility] orthopedics

## 2022-06-07 NOTE — Telephone Encounter (Signed)
[name] with patient and advised him that we would have to see his sister, MRN: [id number] [name] 10/15/1956,in office for first visit to work her up properly per conversation with Dr. Grandville Silos. Patient verbalized understanding and stated that his sister has hx of stroke 1.5 years ago. He stated she is bed ridden, but he may be able to get her in a wheel chair. He states he will call back or either try to get her scheduled when he comes in for his appointment today with Dr. Grandville Silos.

## 2022-06-10 NOTE — Telephone Encounter (Signed)
error 

## 2022-06-15 DIAGNOSIS — M25562 Pain in left knee: Secondary | ICD-10-CM | POA: Diagnosis not present

## 2022-06-18 ENCOUNTER — Other Ambulatory Visit: Payer: Self-pay | Admitting: Family Medicine

## 2022-06-18 DIAGNOSIS — L249 Irritant contact dermatitis, unspecified cause: Secondary | ICD-10-CM

## 2022-07-18 ENCOUNTER — Other Ambulatory Visit: Payer: Self-pay

## 2022-07-18 ENCOUNTER — Ambulatory Visit (HOSPITAL_COMMUNITY)
Admission: EM | Admit: 2022-07-18 | Discharge: 2022-07-18 | Disposition: A | Discharge status: Home / Self Care | Payer: 59 | Attending: Physician Assistant | Admitting: Physician Assistant

## 2022-07-18 ENCOUNTER — Encounter (HOSPITAL_COMMUNITY): Payer: Self-pay | Admitting: *Deleted

## 2022-07-18 DIAGNOSIS — L249 Irritant contact dermatitis, unspecified cause: Secondary | ICD-10-CM | POA: Diagnosis not present

## 2022-07-18 DIAGNOSIS — L509 Urticaria, unspecified: Secondary | ICD-10-CM

## 2022-07-18 DIAGNOSIS — L282 Other prurigo: Secondary | ICD-10-CM

## 2022-07-18 MED ORDER — PREDNISONE 20 MG PO TABS: 20.0000 mg | ORAL_TABLET | Freq: Every day | ORAL | 0 refills | Status: AC

## 2022-07-18 MED ORDER — FAMOTIDINE 20 MG PO TABS: 20.0000 mg | ORAL_TABLET | Freq: Every day | ORAL | 0 refills | Status: AC

## 2022-07-18 MED ORDER — LEVOCETIRIZINE DIHYDROCHLORIDE 5 MG PO TABS: 5.0000 mg | ORAL_TABLET | Freq: Every evening | ORAL | 0 refills | Status: DC

## 2022-07-18 NOTE — ED Provider Notes (Signed)
[place]    CSN: [id number] Arrival date & time: 07/18/22  [facility]      History   Chief Complaint Chief Complaint  Patient presents with   Rash    HPI [name] is a 72 y.o. male.   Patient presents today with a several day history of pruritic rash.  He reports that for the past several months he has had intermittent episodes of this condition.  At one point he was told this was related to scabies and was treated with permethrin but did not have resolution of symptoms.  He was seen by different provider who thought symptoms were more related to atopic dermatitis and was started on prednisone as well as Xyzal.  He did have resolution of symptoms following this course of medication until recurrence recently.  He denies any changes to personal hygiene products including soaps or detergents.  He denies history of dermatological condition including psoriasis or eczema.  He denies any swelling of his throat, shortness of breath, muffled voice, nausea, vomiting, wheezing.  Denies history of diabetes and has not had prednisone since last course several months ago.  Denies any recent medication changes.  He has not tried any over-the-counter medication for symptom management.    Past Medical History:  Diagnosis Date   ACS (acute coronary syndrome) ([place])    Acute anterior wall MI ([place])    Acute blood loss anemia    Acute heart failure ([place])    Acute hypoxemic respiratory failure (HCC)    Acute pulmonary edema (HCC)    Acute respiratory failure with hypoxia (HCC)    Aneurysm of left ventricle of heart 04/12/2017   Anoxic brain injury ([place])    Bacteremia    Benign essential HTN    CAD in native artery    Cardiogenic shock (HCC)    CHF (congestive heart failure), NYHA class II, acute, combined ([place]) 04/11/2017   Chronic neck pain    Chronic systolic (congestive) heart failure (HCC)    Confusion    Congestive heart failure (CHF) (HCC)    COPD (chronic obstructive pulmonary  disease) ([place]) 04/11/2017   Coronary artery disease    Dysphagia    Encounter for central line placement    Encounter for management of intra-aortic balloon pump    Essential hypertension    Fever    Hyperglycemia    Hypertension    Hypokalemia    Hypotension due to drugs    Hypoxemia    Labile blood pressure    Leukocytosis    Physical debility 04/27/2017   Rash 05/15/2017   Sepsis ([place])    Sore throat    STEMI (ST elevation myocardial infarction) ([place]) 04/11/2017   Supplemental oxygen dependent    Tachypnea    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 [facility])     Patient Active Problem List   Diagnosis Date Noted   History of tobacco use 02/18/2021   Muscle cramps 02/18/2021   Well adult exam 03/06/2019   Penile discharge 09/26/2018   Need for hepatitis C screening test 10/06/2017   Elevated serum creatinine 10/06/2017   Iron deficiency anemia 10/06/2017   Essential hypertension    Confusion    Anoxic brain injury ([place])    Chronic systolic (congestive) heart failure ([place])    CAD in native artery    Physical debility 04/27/2017   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 [facility])    Congestive heart failure (CHF) ([place])    Dysphagia  Chronic neck pain    Aneurysm of left ventricle of heart 04/12/2017   Encounter for screening colonoscopy    STEMI (ST elevation myocardial infarction) ([place]) 04/11/2017   CHF (congestive heart failure), NYHA class II, acute, combined ([place]) 04/11/2017    Past Surgical History:  Procedure Laterality Date   APPLICATION OF WOUND VAC N/A 04/14/2017   Procedure: APPLICATION OF WOUND VAC with reposition of Impella device;  Surgeon: Grace Isaac, MD;  Location: [place] OR;  Service: Thoracic;  Laterality: N/A;   CARDIAC CATHETERIZATION     CORONARY STENT INTERVENTION N/A 04/11/2017   Procedure: CORONARY STENT INTERVENTION;  Surgeon: Jettie Booze, MD;  Location: [place] CV LAB;   Service: Cardiovascular;  Laterality: N/A;   CORONARY THROMBECTOMY N/A 04/11/2017   Procedure: Coronary Thrombectomy;  Surgeon: Jettie Booze, MD;  Location: [place] CV LAB;  Service: Cardiovascular;  Laterality: N/A;   CORONARY/GRAFT ACUTE MI REVASCULARIZATION N/A 04/11/2017   Procedure: Coronary/Graft Acute MI Revascularization;  Surgeon: Jettie Booze, MD;  Location: [place] CV LAB;  Service: Cardiovascular;  Laterality: N/A;   IABP INSERTION N/A 04/11/2017   Procedure: IABP Insertion;  Surgeon: Jettie Booze, MD;  Location: [place] [facility] LAB;  Service: Cardiovascular;  Laterality: N/A;   LEFT HEART CATH AND CORONARY ANGIOGRAPHY N/A 04/11/2017   Procedure: LEFT HEART CATH AND CORONARY ANGIOGRAPHY;  Surgeon: Jettie Booze, MD;  Location: [place] [facility] LAB;  Service: Cardiovascular;  Laterality: N/A;   PLACEMENT OF IMPELLA LEFT VENTRICULAR ASSIST DEVICE  04/12/2017   Procedure: PLACEMENT OF IMPELLA  LD;  Surgeon: Grace Isaac, MD;  Location: [place];  Service: Open Heart Surgery;;   REMOVAL OF IMPELLA LEFT VENTRICULAR ASSIST DEVICE N/A 04/17/2017   Procedure: REMOVAL OF IMPELLA LEFT VENTRICULAR ASSIST DEVICE;  Surgeon: Grace Isaac, MD;  Location: [place];  Service: Open Heart Surgery;  Laterality: N/A;   REPAIR OF RIGHT VENTRICLE LACERATION Left 04/12/2017   Procedure: Resection of LV Aneurysm;  Surgeon: Grace Isaac, MD;  Location: [place];  Service: Open Heart Surgery;  Laterality: Left;   STERNAL CLOSURE N/A 04/14/2017   Procedure: STERNAL CLOSURE;  Surgeon: Grace Isaac, MD;  Location: [place];  Service: Thoracic;  Laterality: N/A;   TEE WITHOUT CARDIOVERSION N/A 04/14/2017   Procedure: TRANSESOPHAGEAL ECHOCARDIOGRAM (TEE);  Surgeon: Grace Isaac, MD;  Location: [facility] OR;  Service: Thoracic;  Laterality: N/A;   ULTRASOUND GUIDANCE FOR VASCULAR ACCESS  04/11/2017   Procedure: Ultrasound Guidance For Vascular Access;  Surgeon: Jettie Booze, MD;  Location: [place] CV LAB;  Service: Cardiovascular;;       Home Medications    Prior to Admission medications   Medication Sig Start Date End Date Taking? Authorizing Provider  famotidine (PEPCID) 20 MG tablet Take 1 tablet (20 mg total) by mouth at bedtime. 07/18/22  Yes Zyan Coby K, PA-C  predniSONE (DELTASONE) 20 MG tablet Take 1 tablet (20 mg total) by mouth daily for 3 days. 07/18/22 07/21/22 Yes Joran Kallal, Derry Skill, PA-C  aspirin 81 MG chewable tablet Chew 81 mg by mouth daily. Patient not taking: Reported on 05/27/2022    [provider]  atorvastatin (LIPITOR) 80 MG tablet TAKE 1 TABLET (80 MG) BY ORAL ROUTE ONCE DAILY, AT [facility] 02/24/21   Libby Maw, MD  carvedilol (COREG) 3.125 MG tablet TAKE 1 TABLET BY MOUTH TWICE A DAY WITH FOOD 12/01/21   Jettie Booze, MD  Cholecalciferol (VITAMIN D3)  125 MCG (5000 UT) CAPS Take by mouth.    [provider]  clopidogrel (PLAVIX) 75 MG tablet TAKE 1 TABLET BY MOUTH EVERY DAY 12/21/21   Jettie Booze, MD  cyclobenzaprine (FLEXERIL) 5 MG tablet TAKE 1 TABLET BY MOUTH 2 TIMES DAILY AS NEEDED FOR MUSCLE SPASMS. Patient not taking: Reported on 06/07/2022 08/12/20   Ronnald Nian, DO  diclofenac Sodium (VOLTAREN) 1 % GEL Apply 4 g topically 4 (four) times daily as needed. 06/07/22   Bonnita Hollow, MD  ENTRESTO 24-26 MG TAKE 1 TABLET BY MOUTH TWICE A DAY 10/22/21   Jettie Booze, MD  Iron, Ferrous Sulfate, 325 (65 Fe) MG TABS Take 325 mg by mouth daily. 02/19/21   Libby Maw, MD  levocetirizine (XYZAL) 5 MG tablet Take 1 tablet (5 mg total) by mouth every evening. 07/18/22   Legend Tumminello, Derry Skill, PA-C  Multiple Vitamins-Minerals (MULTIVITAMIN WITH MINERALS) tablet Take 1 tablet by mouth daily.    [provider]    Family History Family History  Problem Relation Age of Onset   Esophageal cancer Mother     Social History Social History   Tobacco Use   Smoking  status: Former    Packs/day: 1.50    Years: 53.00    Total pack years: 79.50    Types: Cigarettes    Quit date: 04/11/2017    Years since quitting: 5.2    Passive exposure: Never   Smokeless tobacco: Never  Vaping Use   Vaping Use: Never used  Substance Use Topics   Alcohol use: Not Currently   Drug use: Never     Allergies   Other and Tomato   Review of Systems Review of Systems  Constitutional:  Positive for activity change. Negative for appetite change, fatigue and fever.  HENT:  Negative for sore throat, trouble swallowing and voice change.   Respiratory:  Negative for shortness of breath.   Skin:  Positive for rash.     Physical Exam Triage Vital Signs ED Triage Vitals  Enc Vitals Group     BP 07/18/22 1840 121/63     Pulse Rate 07/18/22 1840 85     Resp 07/18/22 1840 20     Temp 07/18/22 1840 (!) 97.4 F (36.3 C)     Temp src --      SpO2 07/18/22 1840 94 %     Weight --      Height --      Head Circumference --      Peak Flow --      Pain Score 07/18/22 1838 0     Pain Loc --      Pain Edu? --      Excl. in [place]? --    No data found.  Updated Vital Signs BP 121/63   Pulse 85   Temp (!) 97.4 F (36.3 C)   Resp 20   SpO2 94%   Visual Acuity Right Eye Distance:   Left Eye Distance:   Bilateral Distance:    Right Eye Near:   Left Eye Near:    Bilateral Near:     Physical Exam Vitals reviewed.  Constitutional:      General: He is awake.     Appearance: Normal appearance. He is well-developed. He is not ill-appearing.     Comments: Very pleasant male appears stated age in no acute distress sitting comfortably in exam room  HENT:     Head: Normocephalic and atraumatic.  Mouth/Throat:     Dentition: Abnormal dentition.     Pharynx: Uvula midline. No oropharyngeal exudate, posterior oropharyngeal erythema or uvula swelling.  Pulmonary:     Effort: Pulmonary effort is normal. No tachypnea, accessory muscle usage or respiratory distress.      Breath sounds: Normal breath sounds. No wheezing, rhonchi or rales.  Skin:    Findings: Rash present. Rash is urticarial.     Comments: Raised urticarial rash in bilateral antecubital spaces and along anterior neck.  No additional lesions.  No burrows.  Neurological:     Mental Status: He is alert.  Psychiatric:        Behavior: Behavior is cooperative.      UC Treatments / Results  Labs (all labs ordered are listed, but only abnormal results are displayed) Labs Reviewed - No data to display  EKG   Radiology No results found.  Procedures Procedures (including critical care time)  Medications Ordered in UC Medications - No data to display  Initial Impression / Assessment and Plan / UC Course  I have reviewed the triage vital signs and the nursing notes.  Pertinent labs & imaging results that were available during my care of the patient were reviewed by me and considered in my medical decision making (see chart for details).     Patient is well-appearing, afebrile, nontoxic, nontachycardic.  Given distribution and appearance of rash concern for urticaria.  No active burrows or physical exam findings concerning for scabies.  Will treat with H1 and H2 blockade and Xyzal and famotidine sent to pharmacy.  Will also start short course of prednisone 20 mg for 3 days.  We discussed that we typically do not like to use this medication in patients with a history of cardiovascular disease given it can lead to CHF exacerbation, however, patient appears euvolemic today and was encouraged to limit salt and obtain daily weights.  Discussed that he is not to take NSAIDs with this medication.  Recommended to use hypoallergenic soaps and detergents.  Recommended follow-up with primary care if symptoms or not improving quickly.  Discussed that if he has any worsening symptoms including spread of rash, swelling of his throat, shortness of breath, change in voice, nausea/vomiting he needs to be  seen immediately.  Strict return precautions given.  Discussed case with Dr. Windy Carina who agreed with treatment plan.  Final Clinical Impressions(s) / UC Diagnoses   Final diagnoses:  Urticaria  Pruritic rash     Discharge Instructions      Your rash appears to be hives.  Please use hypoallergenic soaps and detergents and wear loosefitting cotton clothing.  Take levocetirizine at night as well as famotidine at night.  You can use prednisone in the morning for 3 days.  If your symptoms are not improving or if anything worsens and you have spread of rash, swelling of your throat, shortness of breath, change in your voice, nausea/vomiting you need to be seen immediately.  Follow-up with your primary care within the week.     ED Prescriptions     Medication Sig Dispense Auth. Provider   levocetirizine (XYZAL) 5 MG tablet Take 1 tablet (5 mg total) by mouth every evening. 30 tablet Suriah Peragine K, PA-C   predniSONE (DELTASONE) 20 MG tablet Take 1 tablet (20 mg total) by mouth daily for 3 days. 3 tablet Makale Pindell K, PA-C   famotidine (PEPCID) 20 MG tablet Take 1 tablet (20 mg total) by mouth at bedtime. 30 tablet Taye Cato, Derry Skill,  PA-C      PDMP not reviewed this encounter.   Terrilee Croak, PA-C 07/18/22 1909

## 2022-07-18 NOTE — Discharge Instructions (Signed)
Your rash appears to be hives.  Please use hypoallergenic soaps and detergents and wear loosefitting cotton clothing.  Take levocetirizine at night as well as famotidine at night.  You can use prednisone in the morning for 3 days.  If your symptoms are not improving or if anything worsens and you have spread of rash, swelling of your throat, shortness of breath, change in your voice, nausea/vomiting you need to be seen immediately.  Follow-up with your primary care within the week.

## 2022-07-18 NOTE — ED Triage Notes (Signed)
Pt reports rash started today .

## 2022-07-21 ENCOUNTER — Telehealth: Payer: Self-pay | Admitting: General Practice

## 2022-07-21 NOTE — Telephone Encounter (Signed)
Contacted [name] to schedule their annual wellness visit. Appointment made for 07/29/22.  [name] AWV direct phone # [phone]

## 2022-08-04 ENCOUNTER — Encounter (HOSPITAL_COMMUNITY): Payer: Self-pay | Admitting: Emergency Medicine

## 2022-08-04 ENCOUNTER — Other Ambulatory Visit: Payer: Self-pay | Admitting: Family Medicine

## 2022-08-04 ENCOUNTER — Ambulatory Visit (HOSPITAL_COMMUNITY)
Admission: EM | Admit: 2022-08-04 | Discharge: 2022-08-04 | Disposition: A | Discharge status: Home / Self Care | Payer: 59 | Attending: Physician Assistant | Admitting: Physician Assistant

## 2022-08-04 DIAGNOSIS — K029 Dental caries, unspecified: Secondary | ICD-10-CM

## 2022-08-04 MED ORDER — CLINDAMYCIN HCL 300 MG PO CAPS: 300.0000 mg | ORAL_CAPSULE | Freq: Three times a day (TID) | ORAL | 0 refills | Status: AC

## 2022-08-04 NOTE — ED Provider Notes (Signed)
[place]    CSN: [id number] Arrival date & time: 08/04/22  1339      History   Chief Complaint Chief Complaint  Patient presents with   Dental Pain    HPI [name] is a 72 y.o. male.   Patient complains of multiple bad teeth.  Patient reports he has been trying to get an appointment with oral surgeon to extract 8 of his remaining teeth.  Patient complains of discomfort.  Reports the dentist said that they could only pull 1.  The history is provided by the patient. No language interpreter was used.  Dental Pain Quality:  Aching Severity:  Moderate Onset quality:  Gradual Timing:  Constant Progression:  Worsening Relieved by:  Nothing Worsened by:  Nothing Associated symptoms: no fever     Past Medical History:  Diagnosis Date   ACS (acute coronary syndrome) (HCC)    Acute anterior wall MI ([place])    Acute blood loss anemia    Acute heart failure (HCC)    Acute hypoxemic respiratory failure (HCC)    Acute pulmonary edema (HCC)    Acute respiratory failure with hypoxia (HCC)    Aneurysm of left ventricle of heart 04/12/2017   Anoxic brain injury ([place])    Bacteremia    Benign essential HTN    CAD in native artery    Cardiogenic shock (HCC)    CHF (congestive heart failure), NYHA class II, acute, combined ([place]) 04/11/2017   Chronic neck pain    Chronic systolic (congestive) heart failure (HCC)    Confusion    Congestive heart failure (CHF) ([place])    COPD (chronic obstructive pulmonary disease) ([place]) 04/11/2017   Coronary artery disease    Dysphagia    Encounter for central line placement    Encounter for management of intra-aortic balloon pump    Essential hypertension    Fever    Hyperglycemia    Hypertension    Hypokalemia    Hypotension due to drugs    Hypoxemia    Labile blood pressure    Leukocytosis    Physical debility 04/27/2017   Rash 05/15/2017   Sepsis ([place])    Sore throat    STEMI (ST elevation myocardial infarction) ([place])  04/11/2017   Supplemental oxygen dependent    Tachypnea    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 [facility])     Patient Active Problem List   Diagnosis Date Noted   History of tobacco use 02/18/2021   Muscle cramps 02/18/2021   Well adult exam 03/06/2019   Penile discharge 09/26/2018   Need for hepatitis C screening test 10/06/2017   Elevated serum creatinine 10/06/2017   Iron deficiency anemia 10/06/2017   Essential hypertension    Confusion    Anoxic brain injury ([place])    Chronic systolic (congestive) heart failure ([place])    CAD in native artery    Physical debility 04/27/2017   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 [facility])    Congestive heart failure (CHF) ([place])    Dysphagia    Chronic neck pain    Aneurysm of left ventricle of heart 04/12/2017   Encounter for screening colonoscopy    STEMI (ST elevation myocardial infarction) ([place]) 04/11/2017   CHF (congestive heart failure), NYHA class II, acute, combined ([place]) 04/11/2017    Past Surgical History:  Procedure Laterality Date   APPLICATION OF WOUND VAC N/A 04/14/2017   Procedure: APPLICATION OF WOUND VAC with reposition of Impella device;  Surgeon: Grace Isaac, MD;  Location: [facility] OR;  Service: Thoracic;  Laterality: N/A;   CARDIAC CATHETERIZATION     CORONARY STENT INTERVENTION N/A 04/11/2017   Procedure: CORONARY STENT INTERVENTION;  Surgeon: Jettie Booze, MD;  Location: [place] CV LAB;  Service: Cardiovascular;  Laterality: N/A;   CORONARY THROMBECTOMY N/A 04/11/2017   Procedure: Coronary Thrombectomy;  Surgeon: Jettie Booze, MD;  Location: [place] CV LAB;  Service: Cardiovascular;  Laterality: N/A;   CORONARY/GRAFT ACUTE MI REVASCULARIZATION N/A 04/11/2017   Procedure: Coronary/Graft Acute MI Revascularization;  Surgeon: Jettie Booze, MD;  Location: [place] CV LAB;  Service: Cardiovascular;  Laterality: N/A;   IABP INSERTION  N/A 04/11/2017   Procedure: IABP Insertion;  Surgeon: Jettie Booze, MD;  Location: [place] [facility] LAB;  Service: Cardiovascular;  Laterality: N/A;   LEFT HEART CATH AND CORONARY ANGIOGRAPHY N/A 04/11/2017   Procedure: LEFT HEART CATH AND CORONARY ANGIOGRAPHY;  Surgeon: Jettie Booze, MD;  Location: [place] [facility] LAB;  Service: Cardiovascular;  Laterality: N/A;   PLACEMENT OF IMPELLA LEFT VENTRICULAR ASSIST DEVICE  04/12/2017   Procedure: PLACEMENT OF IMPELLA  LD;  Surgeon: Grace Isaac, MD;  Location: [place];  Service: Open Heart Surgery;;   REMOVAL OF IMPELLA LEFT VENTRICULAR ASSIST DEVICE N/A 04/17/2017   Procedure: REMOVAL OF IMPELLA LEFT VENTRICULAR ASSIST DEVICE;  Surgeon: Grace Isaac, MD;  Location: [place];  Service: Open Heart Surgery;  Laterality: N/A;   REPAIR OF RIGHT VENTRICLE LACERATION Left 04/12/2017   Procedure: Resection of LV Aneurysm;  Surgeon: Grace Isaac, MD;  Location: [place];  Service: Open Heart Surgery;  Laterality: Left;   STERNAL CLOSURE N/A 04/14/2017   Procedure: STERNAL CLOSURE;  Surgeon: Grace Isaac, MD;  Location: [place];  Service: Thoracic;  Laterality: N/A;   TEE WITHOUT CARDIOVERSION N/A 04/14/2017   Procedure: TRANSESOPHAGEAL ECHOCARDIOGRAM (TEE);  Surgeon: Grace Isaac, MD;  Location: [facility] OR;  Service: Thoracic;  Laterality: N/A;   ULTRASOUND GUIDANCE FOR VASCULAR ACCESS  04/11/2017   Procedure: Ultrasound Guidance For Vascular Access;  Surgeon: Jettie Booze, MD;  Location: [place] [facility] LAB;  Service: Cardiovascular;;       Home Medications    Prior to Admission medications   Medication Sig Start Date End Date Taking? Authorizing Provider  clindamycin (CLEOCIN) 300 MG capsule Take 1 capsule (300 mg total) by mouth 3 (three) times daily for 10 days. 08/04/22 08/14/22 Yes Fransico Meadow, PA-C  aspirin 81 MG chewable tablet Chew 81 mg by mouth daily. Patient not taking: Reported on 05/27/2022    [provider]  atorvastatin (LIPITOR) 80 MG tablet TAKE 1 TABLET (80 MG) BY ORAL ROUTE ONCE DAILY, AT [facility] 02/24/21   Libby Maw, MD  carvedilol (COREG) 3.125 MG tablet TAKE 1 TABLET BY MOUTH TWICE A DAY WITH FOOD 12/01/21   Jettie Booze, MD  Cholecalciferol (VITAMIN D3) 125 MCG (5000 UT) CAPS Take by mouth.    [provider]  clopidogrel (PLAVIX) 75 MG tablet TAKE 1 TABLET BY MOUTH EVERY DAY 12/21/21   Jettie Booze, MD  cyclobenzaprine (FLEXERIL) 5 MG tablet TAKE 1 TABLET BY MOUTH 2 TIMES DAILY AS NEEDED FOR MUSCLE SPASMS. Patient not taking: Reported on 06/07/2022 08/12/20   Ronnald Nian, DO  diclofenac Sodium (VOLTAREN) 1 % GEL Apply 4 g topically 4 (four) times daily as needed. 06/07/22   Bonnita Hollow, MD  ENTRESTO 24-26  MG TAKE 1 TABLET BY MOUTH TWICE A DAY 10/22/21   Jettie Booze, MD  famotidine (PEPCID) 20 MG tablet Take 1 tablet (20 mg total) by mouth at bedtime. 07/18/22   Raspet, Derry Skill, PA-C  Iron, Ferrous Sulfate, 325 (65 Fe) MG TABS Take 325 mg by mouth daily. 02/19/21   Libby Maw, MD  levocetirizine (XYZAL) 5 MG tablet Take 1 tablet (5 mg total) by mouth every evening. 07/18/22   Raspet, Derry Skill, PA-C  Multiple Vitamins-Minerals (MULTIVITAMIN WITH MINERALS) tablet Take 1 tablet by mouth daily.    [provider]    Family History Family History  Problem Relation Age of Onset   Esophageal cancer Mother     Social History Social History   Tobacco Use   Smoking status: Former    Packs/day: 1.50    Years: 53.00    Total pack years: 79.50    Types: Cigarettes    Quit date: 04/11/2017    Years since quitting: 5.3    Passive exposure: Never   Smokeless tobacco: Never  Vaping Use   Vaping Use: Never used  Substance Use Topics   Alcohol use: Not Currently   Drug use: Never     Allergies   Other and Tomato   Review of Systems Review of Systems  Constitutional:  Negative for fever.  All other systems  reviewed and are negative.    Physical Exam Triage Vital Signs ED Triage Vitals  Enc Vitals Group     BP 08/04/22 1458 (!) 155/81     Pulse Rate 08/04/22 1458 83     Resp 08/04/22 1458 17     Temp 08/04/22 1458 98.2 F (36.8 C)     Temp Source 08/04/22 1458 Oral     SpO2 08/04/22 1458 93 %     Weight --      Height --      Head Circumference --      Peak Flow --      Pain Score 08/04/22 1457 10     Pain Loc --      Pain Edu? --      Excl. in [place]? --    No data found.  Updated Vital Signs BP (!) 155/81 (BP Location: Left Arm)   Pulse 83   Temp 98.2 F (36.8 C) (Oral)   Resp 17   SpO2 93%   Visual Acuity Right Eye Distance:   Left Eye Distance:   Bilateral Distance:    Right Eye Near:   Left Eye Near:    Bilateral Near:     Physical Exam Vitals reviewed.  Constitutional:      Appearance: Normal appearance.  HENT:     Head: Normocephalic.     Mouth/Throat:     Comments: Patient has multiple dark decayed teeth, no obvious abscess no facial swelling no trismus Eyes:     Pupils: Pupils are equal, round, and reactive to light.  Cardiovascular:     Rate and Rhythm: Normal rate.  Pulmonary:     Effort: Pulmonary effort is normal.  Skin:    General: Skin is warm.  Neurological:     General: No focal deficit present.     Mental Status: He is alert.  Psychiatric:        Mood and Affect: Mood normal.      UC Treatments / Results  Labs (all labs ordered are listed, but only abnormal results are displayed) Labs Reviewed - No data to  display  EKG   Radiology No results found.  Procedures Procedures (including critical care time)  Medications Ordered in UC Medications - No data to display  Initial Impression / Assessment and Plan / UC Course  I have reviewed the triage vital signs and the nursing notes.  Pertinent labs & imaging results that were available during my care of the patient were reviewed by me and considered in my medical decision  making (see chart for details).      Final Clinical Impressions(s) / UC Diagnoses   Final diagnoses:  Dental decay     Discharge Instructions      Follow up with oral surgery for evaluation    ED Prescriptions     Medication Sig Dispense Auth. Provider   clindamycin (CLEOCIN) 300 MG capsule Take 1 capsule (300 mg total) by mouth 3 (three) times daily for 10 days. 21 capsule Fransico Meadow, [place]      PDMP not reviewed this encounter. An After Visit Summary was printed and given to the patient.        Fransico Meadow, [place] 08/04/22 [phone]

## 2022-08-04 NOTE — Discharge Instructions (Addendum)
Follow up with oral surgery for evaluation

## 2022-08-04 NOTE — ED Triage Notes (Signed)
Pt c/o broken and rotten teeth and waiting to get in with dentist about getting pulled. Needing medications to help with pain

## 2022-08-22 ENCOUNTER — Other Ambulatory Visit: Payer: Self-pay | Admitting: Family Medicine

## 2022-08-26 ENCOUNTER — Telehealth: Payer: Self-pay | Admitting: Home Health

## 2022-08-26 MED ORDER — ATORVASTATIN CALCIUM 80 MG PO TABS
ORAL_TABLET | ORAL | 0 refills | Status: DC
Start: 2022-08-26 — End: 2022-11-22

## 2022-08-26 NOTE — Telephone Encounter (Signed)
Patient called after hour line, reports no refill for lipitor, called 4 days ago and had no response, refill sent. Message sent to triage for arranging follow up in 1-2 month.

## 2022-09-09 ENCOUNTER — Telehealth: Payer: Self-pay | Admitting: Interventional Cardiology

## 2022-09-09 NOTE — Telephone Encounter (Signed)
Patient brought in a Disability Parking Placard application for Dr. Eldridge Dace to sign.  It is in Dr. [name] Barr box.

## 2022-09-09 NOTE — Telephone Encounter (Signed)
I spoke with patient.  He has a handicap card for his car and it needs renewal.  He will drop it off in the office

## 2022-09-09 NOTE — Telephone Encounter (Signed)
Patient is calling because he has a renewal form about his disability with the [employer]. Patient stated that he needed this form filled out and was wanting to know if he can drop it off at the front desk. Patient also wanted to know if he needed to be seen before his scheduled appt of May 14th to have this form filled out. Please advise.

## 2022-09-09 NOTE — Telephone Encounter (Signed)
Left message to call office

## 2022-09-13 NOTE — Telephone Encounter (Signed)
Signed paperwork left at front desk for patient to pick up.  Patient made aware

## 2022-09-16 ENCOUNTER — Telehealth: Payer: Self-pay

## 2022-09-16 NOTE — Telephone Encounter (Signed)
This nurse called patient for AWV. He said he was busy right now going to the vet with his puppy. He said he does not know why this has to be done. Medicare knows everything there is to know about him so what is the point.

## 2022-10-10 NOTE — Progress Notes (Deleted)
Office Visit    Patient Name: [name] Date of Encounter: 10/10/2022  Primary Care Provider:  Garnette Gunner, MD Primary Cardiologist:  Lance Muss, MD Primary Electrophysiologist: None   Past Medical History    Past Medical History:  Diagnosis Date   ACS (acute coronary syndrome) (HCC)    Acute anterior wall MI (HCC)    Acute blood loss anemia    Acute heart failure (HCC)    Acute hypoxemic respiratory failure (HCC)    Acute pulmonary edema (HCC)    Acute respiratory failure with hypoxia (HCC)    Aneurysm of left ventricle of heart 04/12/2017   Anoxic brain injury (HCC)    Bacteremia    Benign essential HTN    CAD in native artery    Cardiogenic shock (HCC)    CHF (congestive heart failure), NYHA class II, acute, combined (HCC) 04/11/2017   Chronic neck pain    Chronic systolic (congestive) heart failure (HCC)    Confusion    Congestive heart failure (CHF) (HCC)    COPD (chronic obstructive pulmonary disease) (HCC) 04/11/2017   Coronary artery disease    Dysphagia    Encounter for central line placement    Encounter for management of intra-aortic balloon pump    Essential hypertension    Fever    Hyperglycemia    Hypertension    Hypokalemia    Hypotension due to drugs    Hypoxemia    Labile blood pressure    Leukocytosis    Physical debility 04/27/2017   Rash 05/15/2017   Sepsis (HCC)    Sore throat    STEMI (ST elevation myocardial infarction) (HCC) 04/11/2017   Supplemental oxygen dependent    Tachypnea    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 [facility])    Past Surgical History:  Procedure Laterality Date   APPLICATION OF WOUND VAC N/A 04/14/2017   Procedure: APPLICATION OF WOUND VAC with reposition of Impella device;  Surgeon: Delight Ovens, MD;  Location: [facility] OR;  Service: Thoracic;  Laterality: N/A;   CARDIAC CATHETERIZATION     CORONARY STENT INTERVENTION N/A 04/11/2017   Procedure: CORONARY STENT  INTERVENTION;  Surgeon: Corky Crafts, MD;  Location: [facility] INVASIVE CV LAB;  Service: Cardiovascular;  Laterality: N/A;   CORONARY THROMBECTOMY N/A 04/11/2017   Procedure: Coronary Thrombectomy;  Surgeon: Corky Crafts, MD;  Location: [facility] INVASIVE CV LAB;  Service: Cardiovascular;  Laterality: N/A;   CORONARY/GRAFT ACUTE MI REVASCULARIZATION N/A 04/11/2017   Procedure: Coronary/Graft Acute MI Revascularization;  Surgeon: Corky Crafts, MD;  Location: [facility] INVASIVE CV LAB;  Service: Cardiovascular;  Laterality: N/A;   IABP INSERTION N/A 04/11/2017   Procedure: IABP Insertion;  Surgeon: Corky Crafts, MD;  Location: [facility] INVASIVE CV LAB;  Service: Cardiovascular;  Laterality: N/A;   LEFT HEART CATH AND CORONARY ANGIOGRAPHY N/A 04/11/2017   Procedure: LEFT HEART CATH AND CORONARY ANGIOGRAPHY;  Surgeon: Corky Crafts, MD;  Location: [facility] INVASIVE CV LAB;  Service: Cardiovascular;  Laterality: N/A;   PLACEMENT OF IMPELLA LEFT VENTRICULAR ASSIST DEVICE  04/12/2017   Procedure: PLACEMENT OF IMPELLA  LD;  Surgeon: Delight Ovens, MD;  Location: [facility] OR;  Service: Open Heart Surgery;;   REMOVAL OF IMPELLA LEFT VENTRICULAR ASSIST DEVICE N/A 04/17/2017   Procedure: REMOVAL OF IMPELLA LEFT VENTRICULAR ASSIST DEVICE;  Surgeon: Delight Ovens, MD;  Location: [facility] OR;  Service: Open Heart Surgery;  Laterality: N/A;   REPAIR OF RIGHT VENTRICLE LACERATION  Left 04/12/2017   Procedure: Resection of LV Aneurysm;  Surgeon: Delight Ovens, MD;  Location: [facility] OR;  Service: Open Heart Surgery;  Laterality: Left;   STERNAL CLOSURE N/A 04/14/2017   Procedure: STERNAL CLOSURE;  Surgeon: Delight Ovens, MD;  Location: [facility] OR;  Service: Thoracic;  Laterality: N/A;   TEE WITHOUT CARDIOVERSION N/A 04/14/2017   Procedure: TRANSESOPHAGEAL ECHOCARDIOGRAM (TEE);  Surgeon: Delight Ovens, MD;  Location: [facility] OR;  Service: Thoracic;  Laterality: N/A;   ULTRASOUND GUIDANCE FOR VASCULAR ACCESS   04/11/2017   Procedure: Ultrasound Guidance For Vascular Access;  Surgeon: Corky Crafts, MD;  Location: [facility] INVASIVE CV LAB;  Service: Cardiovascular;;    Allergies  Allergies  Allergen Reactions   Other Other (See Comments)    Potatoes- Blurry eyes, eyes hurt, and patient passes out (thinks he may bed sensitive)   Tomato Other (See Comments)    Potatoes- Blurry eyes, eyes hurt, and patient passes out (thinks he may bed sensitive)     History of Present Illness    [name]  is a 72 year old male with a PMH of CAD s/p inferior STEMI in 1994 and 2018 with DES to RCA complicated by pseudoaneurysm and extensive infarct, systolic CHF, ICM, COPD, HTN, PAF who presents for 1 year follow-up.  Mr. [name] was initially seen in 2018 after suffering inferior lateral STEMI and LHC performed with DES/PCI to RCA and pulm to chronically occluded LAD with IABP placed.  Patient suffered pseudoaneurysm and underwent repair in 2018.  He developed cardiogenic shock and EF was decreased to 25% but improved to 55% on 07/2017.  He was treated with GDMT and followed by advanced heart failure team.  He was last seen by Dr. Eldridge Dace on 09/2021 and during his visit he reported that he quit smoking.  He was doing well overall with no new cardiac complaints.  Since last being seen in the office patient reports***.  Patient denies chest pain, palpitations, dyspnea, PND, orthopnea, nausea, vomiting, dizziness, syncope, edema, weight gain, or early satiety.     ***Notes:  Home Medications    Current Outpatient Medications  Medication Sig Dispense Refill   aspirin 81 MG chewable tablet Chew 81 mg by mouth daily. (Patient not taking: Reported on 05/27/2022)     atorvastatin (LIPITOR) 80 MG tablet TAKE 1 TABLET (80 MG) BY ORAL ROUTE ONCE DAILY, AT 6PM 90 tablet 0   carvedilol (COREG) 3.125 MG tablet TAKE 1 TABLET BY MOUTH TWICE A DAY WITH FOOD 180 tablet 3   Cholecalciferol (VITAMIN D3) 125 MCG (5000 UT)  CAPS Take by mouth.     clopidogrel (PLAVIX) 75 MG tablet TAKE 1 TABLET BY MOUTH EVERY DAY 90 tablet 3   cyclobenzaprine (FLEXERIL) 5 MG tablet TAKE 1 TABLET BY MOUTH 2 TIMES DAILY AS NEEDED FOR MUSCLE SPASMS. (Patient not taking: Reported on 06/07/2022) 60 tablet 0   diclofenac Sodium (VOLTAREN) 1 % GEL Apply 4 g topically 4 (four) times daily as needed. 100 g 3   ENTRESTO 24-26 MG TAKE 1 TABLET BY MOUTH TWICE A DAY 60 tablet 11   famotidine (PEPCID) 20 MG tablet Take 1 tablet (20 mg total) by mouth at bedtime. 30 tablet 0   Iron, Ferrous Sulfate, 325 (65 Fe) MG TABS Take 325 mg by mouth daily. 180 tablet 1   levocetirizine (XYZAL) 5 MG tablet Take 1 tablet (5 mg total) by mouth every evening. 30 tablet 0   Multiple Vitamins-Minerals (MULTIVITAMIN WITH MINERALS)  tablet Take 1 tablet by mouth daily.     No current facility-administered medications for this visit.     Review of Systems  Please see the history of present illness.    (+)*** (+)***  All other systems reviewed and are otherwise negative except as noted above.  Physical Exam    Wt Readings from Last 3 Encounters:  06/07/22 230 lb (104.3 kg)  05/27/22 233 lb (105.7 kg)  10/07/21 232 lb 3.2 oz (105.3 kg)   NW:GNFAO were no vitals filed for this visit.,There is no height or weight on file to calculate BMI.  Constitutional:      Appearance: Healthy appearance. Not in distress.  Neck:     Vascular: JVD normal.  Pulmonary:     Effort: Pulmonary effort is normal.     Breath sounds: No wheezing. No rales. Diminished in the bases Cardiovascular:     Normal rate. Regular rhythm. Normal S1. Normal S2.      Murmurs: There is no murmur.  Edema:    Peripheral edema absent.  Abdominal:     Palpations: Abdomen is soft non tender. There is no hepatomegaly.  Skin:    General: Skin is warm and dry.  Neurological:     General: No focal deficit present.     Mental Status: Alert and oriented to person, place and time.     Cranial  Nerves: Cranial nerves are intact.  EKG/LABS/ Recent Cardiac Studies    ECG personally reviewed by me today - ***  Cardiac Studies & Procedures   CARDIAC CATHETERIZATION  CARDIAC CATHETERIZATION 04/12/2017  Narrative  Mid LAD-2 lesion is 100% stenosed.  Balloon angioplasty was performed using a BALLOON SAPPHIRE 2.5X12. Post intervention, there is a 25% residual stenosis with significant no reflow in the apical LAD.  Dist RCA lesion is 95% stenosed. A drug-eluting stent was successfully placed using a STENT SYNERGY DES 3.5X28.  Post intervention, there is a 0% residual stenosis.  LV end diastolic pressure is severely elevated.  There is no aortic valve stenosis.  Ost 1st Diag lesion is 50% stenosed.  Proximal ramus is 70% stenosed.  IABP placed due to high LVEDP.  Continue IV Angiomax for the current bag.  He will need dual antiplatelet therapy for ideally 1 year.  He will need echocardiogram to evaluate left ventricular function.  I suspect his LV function is severely decreased based on his LVEDP of 44 mmHg.  Continue aggressive secondary prevention, including smoking cessation.  Appreciate assistance from [facility] team for vent management.  IV Lasix ordered.  Findings Coronary Findings Diagnostic  Dominance: Right  Left Anterior Descending Mid LAD-1 lesion is 25% stenosed. Mid LAD-2 lesion is 100% stenosed. The lesion is heavily thrombotic.  First Diagonal Branch Ost 1st Diag lesion is 50% stenosed.  Left Circumflex  First Obtuse Marginal Branch Ost 1st Mrg lesion is 70% stenosed.  Right Coronary Artery Ost RCA to Prox RCA lesion is 40% stenosed. Dist RCA lesion is 95% stenosed. The lesion is ulcerative.  Intervention  Mid LAD-2 lesion Thrombectomy The clot was removed by aspiration. The catheter used was a CATH PRONTO V4 5.6F. Passes taken: 2. Saline infused: 0 mL. Angioplasty Lesion length:  15 mm. Balloon angioplasty was performed using a BALLOON  SAPPHIRE 2.5X12. Maximum pressure: 12 atm. Significant no reflow noted in the apical LAD.  Stent was not placed due to the poor outflow which persisted despite multiple doses of IC adenosine. Post-Intervention Lesion Assessment There is a 25% residual stenosis  post intervention.  Dist RCA lesion Stent Lesion crossed with guidewire using a WIRE ASAHI PROWATER 180CM. Pre-stent angioplasty was performed using a BALLOON SAPPHIRE 2.75X20. A drug-eluting stent was successfully placed using a STENT SYNERGY DES 3.5X28. Stent strut is well apposed. Post-stent angioplasty was performed using a BALLOON SAPPHIRE [place] [id number]. Post-Intervention Lesion Assessment There is a 0% residual stenosis post intervention.     ECHOCARDIOGRAM  ECHOCARDIOGRAM COMPLETE 08/22/2017  Narrative *[place]* *[facility]* [address] [place], [place] [address] [phone]  ------------------------------------------------------------------- Transthoracic Echocardiography  Patient:    Kornell, Hor MR #:       [id number] Study Date: 08/22/2017 Gender:     M Age:        41 Height:     170.2 cm Weight:     86.2 kg BSA:        2.04 m^2 Pt. Status: Room:  ATTENDING    Nicholes Mango, MD ORDERING     Bensimhon, Daniel PERFORMING   [facility], Outpatient SONOGRAPHER  Leta Jungling, RDCS  cc:  -------------------------------------------------------------------  ------------------------------------------------------------------- Indications:      CHF - 428.0.  ------------------------------------------------------------------- History:   PMH:   Coronary artery disease.  Chronic obstructive pulmonary disease.  PMH:   Myocardial infarction.  Risk factors: Hypertension.  ------------------------------------------------------------------- Study Conclusions  - Left ventricle: The cavity size was normal. Wall thickness was normal. Systolic function was mildly reduced. Doppler parameters are  consistent with abnormal left ventricular relaxation (grade 1 diastolic dysfunction). Suspect severe hypokinesis or akinesis of the apex and apical segments of the anterior and anteroseptal walls. Overall EF may be approximately 40%. - Ventricular septum: Septal motion showed paradox. These changes are consistent with a post-thoracotomy state. - Mitral valve: Calcified annulus.  Impressions:  - Normal contractility of the basal LV wall segments. Even with Definity contrast, imaging of the apical segments is mediocre. Suspect at least severe hypokinesis, possibly akinesis of the mid-LAD territory wall segments and overall mildly depressed systolic function.  ------------------------------------------------------------------- Labs, prior tests, procedures, and surgery: Coronary artery bypass grafting.  ------------------------------------------------------------------- Study data:  Comparison was made to the study of 05/08/2017.  Study status:  Routine.  Procedure:  The patient reported no pain pre or post test. Transthoracic echocardiography. Image quality was suboptimal. Intravenous contrast (Definity) was administered. Study completion:  There were no complications. Transthoracic echocardiography.  M-mode, complete 2D, spectral Doppler, and color Doppler.  Birthdate:  Patient birthdate: 09/10/1950.  Age:  Patient is 72 yr old.  Sex:  Gender: male. BMI: 29.8 kg/m^2.  Blood pressure:     140/74  Patient status: Inpatient.  Study date:  Study date: 08/22/2017. Study time: 01:19 PM.  Location:  Echo laboratory.  -------------------------------------------------------------------  ------------------------------------------------------------------- Left ventricle:  The cavity size was normal. Wall thickness was normal. Systolic function was mildly reduced. Doppler parameters are consistent with abnormal left ventricular relaxation (grade 1 diastolic  dysfunction).  ------------------------------------------------------------------- Aortic valve:   Trileaflet; normal thickness leaflets. Mobility was not restricted.  Doppler:  Transvalvular velocity was within the normal range. There was no stenosis. There was no regurgitation.  ------------------------------------------------------------------- Aorta:  The aorta was poorly visualized. Aortic root: The aortic root was normal in size.  ------------------------------------------------------------------- Mitral valve:   Calcified annulus. Leaflet separation was normal. Mobility was not restricted.  Doppler:  Transvalvular velocity was within the normal range. There was no evidence for stenosis. There was no regurgitation.  ------------------------------------------------------------------- Left atrium:  The atrium was normal in size.  ------------------------------------------------------------------- Right ventricle:  The  cavity size was normal. Wall thickness was normal. Systolic function was normal.  ------------------------------------------------------------------- Ventricular septum:   Septal motion showed paradox. These changes are consistent with a post-thoracotomy state.  ------------------------------------------------------------------- Pulmonic valve:   Poorly visualized.  Doppler:  Transvalvular velocity was within the normal range. There was no evidence for stenosis. There was no significant regurgitation.  ------------------------------------------------------------------- Tricuspid valve:  Poorly visualized.  Structurally normal valve. Doppler:  Transvalvular velocity was within the normal range. There was no regurgitation.  ------------------------------------------------------------------- Pulmonary artery:   The main pulmonary artery was normal-sized. Systolic pressure could not be accurately  estimated.  ------------------------------------------------------------------- Right atrium:  The atrium was normal in size.  ------------------------------------------------------------------- Pericardium:  There was no pericardial effusion.  ------------------------------------------------------------------- Systemic veins: Inferior vena cava: The vessel was normal in size.  ------------------------------------------------------------------- Post procedure conclusions Ascending Aorta:  - The aorta was poorly visualized.  ------------------------------------------------------------------- Measurements  Left ventricle                           Value        Reference LV ID, ED, PLAX chordal        (L)       41.3  mm     43 - 52 LV ID, ES, PLAX chordal                  30.3  mm     23 - 38 LV fx shortening, PLAX chordal (L)       27    %      >=29 LV PW thickness, ED                      10.8  mm     --------- IVS/LV PW ratio, ED                      0.94         <=1.3 LV e&', lateral                           4.79  cm/s   --------- LV E/e&', lateral                         13.67        ---------  Ventricular septum                       Value        Reference IVS thickness, ED                        10.1  mm     ---------  LVOT                                     Value        Reference LVOT ID, S                               19    mm     --------- LVOT area  2.84  cm^2   ---------  Aorta                                    Value        Reference Aortic root ID, ED                       29    mm     ---------  Left atrium                              Value        Reference LA ID, A-P, ES                           34    mm     --------- LA ID/bsa, A-P                           1.66  cm/m^2 <=2.2  Mitral valve                             Value        Reference Mitral E-wave peak velocity              65.5  cm/s   --------- Mitral A-wave peak  velocity              84.9  cm/s   --------- Mitral deceleration time       (L)       141   ms     150 - 230 Mitral E/A ratio, peak                   0.8          ---------  Legend: (L)  and  (H)  mark values outside specified reference range.  ------------------------------------------------------------------- Prepared and Electronically Authenticated by  Thurmon Fair, MD 2019-03-26T18:44:15   TEE  ECHO TEE 04/16/2017  Interpretation Summary  Right ventricle: Prior to chest closure, the RV size and systolic function appeared adequate. Following chest closure there was some reduction RV systolic function, but there was no RV enlargement or septal shift.  Mitral valve: The mitral valve leaflets were of normal thickness. There was good leaflet separation. There was normal leaflet coptation without prolapsing or flail leaflet segments. There was trace mitral insufficiency. The impella cannula did not interfere with the mitral valve apparatus.  Tricuspid valve: Trace regurgitation. The tricuspid valve regurgitation jet is central.            Risk Assessment/Calculations:   {Does this patient have ATRIAL FIBRILLATION?[phone]}        Lab Results  Component Value Date   WBC 12.7 (H) 06/06/2022   HGB 13.2 06/06/2022   HCT 42.3 06/06/2022   MCV 89.4 06/06/2022   PLT 263 06/06/2022   Lab Results  Component Value Date   CREATININE 1.13 06/06/2022   BUN 13 06/06/2022   NA 136 06/06/2022   K 4.4 06/06/2022   CL 99 06/06/2022   CO2 29 06/06/2022   Lab Results  Component Value Date   ALT 15 06/06/2022   AST 16 06/06/2022   ALKPHOS 56 06/06/2022   BILITOT 0.8 06/06/2022  Lab Results  Component Value Date   CHOL 103 01/07/2020   HDL 26 (L) 01/07/2020   LDLCALC 63 01/07/2020   TRIG 60 01/07/2020   CHOLHDL 4.0 01/07/2020    Lab Results  Component Value Date   HGBA1C 5.5 07/28/2017     Assessment & Plan    1.  Coronary artery disease: -s/p inferior  STEMI  2.  Paroxysmal atrial fibrillation: -He is currently rate controlled with carvedilol and takes Plavix as anticoagulation   3.  HFrEF/ICM: -Patient's last 2D echo was 07/2017 with improved EF.  4.  Essential hypertension: -Patient's blood pressure today was  5. History of tobacco abuse      Disposition: Follow-up with Lance Muss, MD or APP in *** months {Are you ordering a CV Procedure (e.g. stress test, cath, DCCV, TEE, etc)?   Press F2        :[id number]}   Medication Adjustments/Labs and Tests Ordered: Current medicines are reviewed at length with the patient today.  Concerns regarding medicines are outlined above.   Signed, Napoleon Form, Leodis Rains, NP 10/10/2022, 7:20 PM [facility]

## 2022-10-11 ENCOUNTER — Ambulatory Visit: Payer: 59 | Attending: Nurse Practitioner | Admitting: Nurse Practitioner

## 2022-10-11 DIAGNOSIS — I2510 Atherosclerotic heart disease of native coronary artery without angina pectoris: Secondary | ICD-10-CM

## 2022-10-11 DIAGNOSIS — Z87891 Personal history of nicotine dependence: Secondary | ICD-10-CM

## 2022-10-11 DIAGNOSIS — I10 Essential (primary) hypertension: Secondary | ICD-10-CM

## 2022-10-11 DIAGNOSIS — I255 Ischemic cardiomyopathy: Secondary | ICD-10-CM

## 2022-10-11 DIAGNOSIS — I5022 Chronic systolic (congestive) heart failure: Secondary | ICD-10-CM

## 2022-10-12 ENCOUNTER — Encounter: Payer: Self-pay | Admitting: Nurse Practitioner

## 2022-11-18 ENCOUNTER — Other Ambulatory Visit: Payer: Self-pay | Admitting: Interventional Cardiology

## 2022-11-22 ENCOUNTER — Other Ambulatory Visit: Payer: Self-pay | Admitting: Home Health

## 2022-11-24 ENCOUNTER — Ambulatory Visit (INDEPENDENT_AMBULATORY_CARE_PROVIDER_SITE_OTHER): Payer: 59 | Admitting: Family Medicine

## 2022-11-24 ENCOUNTER — Encounter: Payer: Self-pay | Admitting: Family Medicine

## 2022-11-24 VITALS — BP 130/84 | HR 76 | Temp 97.6°F | Wt 246.0 lb

## 2022-11-24 DIAGNOSIS — Z6839 Body mass index (BMI) 39.0-39.9, adult: Secondary | ICD-10-CM | POA: Diagnosis not present

## 2022-11-24 DIAGNOSIS — D509 Iron deficiency anemia, unspecified: Secondary | ICD-10-CM | POA: Diagnosis not present

## 2022-11-24 DIAGNOSIS — E66812 Obesity, class 2: Secondary | ICD-10-CM

## 2022-11-24 DIAGNOSIS — M25562 Pain in left knee: Secondary | ICD-10-CM

## 2022-11-24 DIAGNOSIS — I5041 Acute combined systolic (congestive) and diastolic (congestive) heart failure: Secondary | ICD-10-CM

## 2022-11-24 DIAGNOSIS — M25561 Pain in right knee: Secondary | ICD-10-CM | POA: Diagnosis not present

## 2022-11-24 DIAGNOSIS — M542 Cervicalgia: Secondary | ICD-10-CM | POA: Diagnosis not present

## 2022-11-24 DIAGNOSIS — I209 Angina pectoris, unspecified: Secondary | ICD-10-CM

## 2022-11-24 DIAGNOSIS — M25551 Pain in right hip: Secondary | ICD-10-CM

## 2022-11-24 DIAGNOSIS — G8929 Other chronic pain: Secondary | ICD-10-CM

## 2022-11-24 DIAGNOSIS — M25552 Pain in left hip: Secondary | ICD-10-CM

## 2022-11-24 DIAGNOSIS — E785 Hyperlipidemia, unspecified: Secondary | ICD-10-CM

## 2022-11-24 DIAGNOSIS — I2510 Atherosclerotic heart disease of native coronary artery without angina pectoris: Secondary | ICD-10-CM

## 2022-11-24 HISTORY — DX: Morbid (severe) obesity due to excess calories: E66.01

## 2022-11-24 HISTORY — DX: Other chronic pain: G89.29

## 2022-11-24 HISTORY — DX: Obesity, class 2: E66.812

## 2022-11-24 HISTORY — DX: Hyperlipidemia, unspecified: E78.5

## 2022-11-24 HISTORY — DX: Angina pectoris, unspecified: I20.9

## 2022-11-24 LAB — CBC WITH DIFFERENTIAL/PLATELET
Basophils Absolute: 0 10*3/uL (ref 0.0–0.1)
Basophils Relative: 0.5 % (ref 0.0–3.0)
Eosinophils Absolute: 0.2 10*3/uL (ref 0.0–0.7)
Eosinophils Relative: 2.4 % (ref 0.0–5.0)
HCT: 42 % (ref 39.0–52.0)
Hemoglobin: 13.3 g/dL (ref 13.0–17.0)
Lymphocytes Relative: 26.7 % (ref 12.0–46.0)
Lymphs Abs: 2.1 10*3/uL (ref 0.7–4.0)
MCHC: 31.6 g/dL (ref 30.0–36.0)
MCV: 87.7 fl (ref 78.0–100.0)
Monocytes Absolute: 0.9 10*3/uL (ref 0.1–1.0)
Monocytes Relative: 11.5 % (ref 3.0–12.0)
Neutro Abs: 4.7 10*3/uL (ref 1.4–7.7)
Neutrophils Relative %: 58.9 % (ref 43.0–77.0)
Platelets: 204 10*3/uL (ref 150.0–400.0)
RBC: 4.79 Mil/uL (ref 4.22–5.81)
RDW: 15.5 % (ref 11.5–15.5)
WBC: 8 10*3/uL (ref 4.0–10.5)

## 2022-11-24 LAB — LIPID PANEL
Cholesterol: 124 mg/dL (ref 0–200)
HDL: 28.3 mg/dL — ABNORMAL LOW (ref >39.00)
LDL Cholesterol: 78 mg/dL (ref 0–99)
NonHDL: 96.05
Total CHOL/HDL Ratio: 4
Triglycerides: 88 mg/dL (ref 0.0–149.0)
VLDL: 17.6 mg/dL (ref 0.0–40.0)

## 2022-11-24 LAB — URINALYSIS, ROUTINE W REFLEX MICROSCOPIC
Bilirubin Urine: NEGATIVE
Hgb urine dipstick: NEGATIVE
Ketones, ur: NEGATIVE
Leukocytes,Ua: NEGATIVE
Nitrite: NEGATIVE
RBC / HPF: NONE SEEN (ref 0–?)
Specific Gravity, Urine: 1.01 (ref 1.000–1.030)
Total Protein, Urine: NEGATIVE
Urine Glucose: NEGATIVE
Urobilinogen, UA: 0.2 (ref 0.0–1.0)
WBC, UA: NONE SEEN (ref 0–?)
pH: 6 (ref 5.0–8.0)

## 2022-11-24 LAB — COMPREHENSIVE METABOLIC PANEL
ALT: 16 U/L (ref 0–53)
AST: 16 U/L (ref 0–37)
Albumin: 4 g/dL (ref 3.5–5.2)
Alkaline Phosphatase: 79 U/L (ref 39–117)
BUN: 18 mg/dL (ref 6–23)
CO2: 30 mEq/L (ref 19–32)
Calcium: 9.3 mg/dL (ref 8.4–10.5)
Chloride: 105 mEq/L (ref 96–112)
Creatinine, Ser: 1.18 mg/dL (ref 0.40–1.50)
GFR: 60.9 mL/min (ref >60.00)
Glucose, Bld: 90 mg/dL (ref 70–99)
Potassium: 4.9 mEq/L (ref 3.5–5.1)
Sodium: 141 mEq/L (ref 135–145)
Total Bilirubin: 0.5 mg/dL (ref 0.2–1.2)
Total Protein: 7 g/dL (ref 6.0–8.3)

## 2022-11-24 LAB — B12 AND FOLATE PANEL
Folate: >23.8 ng/mL (ref >5.9)
Vitamin B-12: 1003 pg/mL — ABNORMAL HIGH (ref 211–911)

## 2022-11-24 LAB — MAGNESIUM: Magnesium: 2 mg/dL (ref 1.5–2.5)

## 2022-11-24 LAB — CK: Total CK: 48 U/L (ref 7–232)

## 2022-11-24 LAB — TSH: TSH: 1.28 u[IU]/mL (ref 0.35–5.50)

## 2022-11-24 LAB — MICROALBUMIN / CREATININE URINE RATIO
Creatinine,U: 43.3 mg/dL
Microalb Creat Ratio: 1.6 mg/g (ref 0.0–30.0)
Microalb, Ur: <0.7 mg/dL (ref 0.0–1.9)

## 2022-11-24 LAB — HEMOGLOBIN A1C: Hgb A1c MFr Bld: 6.3 % (ref 4.6–6.5)

## 2022-11-24 MED ORDER — DICLOFENAC SODIUM 1 % EX GEL: 4.0000 g | Freq: Four times a day (QID) | CUTANEOUS | 3 refills | Status: AC | PRN

## 2022-11-24 MED ORDER — NITROGLYCERIN 0.4 MG SL SUBL: 0.4000 mg | SUBLINGUAL_TABLET | SUBLINGUAL | 3 refills | Status: DC | PRN

## 2022-11-24 MED ORDER — ROSUVASTATIN CALCIUM 20 MG PO TABS: 20.0000 mg | ORAL_TABLET | Freq: Every day | ORAL | 3 refills | Status: DC

## 2022-11-24 NOTE — Assessment & Plan Note (Signed)
Patient on atorvastatin for several years with history of two heart attacks and five stents. -Continue atorvastatin 80mg  daily until further assessment of joint pain cause. -Consider switching to rosuvastatin if joint pain is confirmed to be statin-induced.

## 2022-11-24 NOTE — Assessment & Plan Note (Signed)
Longstanding neck pain with history of cervical and thoracic spine injury. -Order X-rays of cervical and thoracic spine to assess current status. -Consider re-prescribing diclofenac gel for topical pain relief.

## 2022-11-24 NOTE — Assessment & Plan Note (Signed)
Patient reports worsening joint pain in hips and knees over the past year, severe enough to limit walking distance. Patient suspects atorvastatin as the cause. -Order X-rays of hips and knees to assess for osteoarthritis. -Check CK levels to assess for statin-induced myopathy. -Consider switching from atorvastatin to rosuvastatin to potentially reduce joint discomfort.
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
## 2022-11-24 NOTE — Assessment & Plan Note (Signed)
History of two heart attacks, five stents, and currently on Entresto and carvedilol. -Continue current medications. -Consider prescribing nitroglycerin for potential angina relief.

## 2022-11-24 NOTE — Patient Instructions (Signed)
VISIT SUMMARY:  During our visit, we discussed your concerns about joint pain, chronic neck pain, weight gain, and your ongoing management of high cholesterol and heart disease. We also touched on your history of anemia. I appreciate your active involvement in your health and your dedication to caring for your family members.  YOUR PLAN:  -JOINT PAIN: Your joint pain might be due to arthritis or a side effect of your cholesterol medication. We'll do some tests to find out and may switch your medication if necessary.  For xrays, go to:    [place] [facility] [address] [place], [place], [place] [address] Phone: [phone]   -CHRONIC NECK PAIN: We'll take some X-rays of your neck and spine to better understand your neck pain. We may prescribe a topical pain relief gel.  -HIGH CHOLESTEROL: We'll stop atorvastatin and start rosuvastatin to see if it's causing your joint pain.  -HEART DISEASE: We'll continue your current heart medications and may add a new one to help with chest pain.  -ANEMIA: We'll check your blood counts and nutrient levels to monitor your anemia.  -WEIGHT GAIN: If your weight gain continues, we may consider dietary counseling or other weight management strategies.  INSTRUCTIONS:  Please follow up with [place] to check your cholesterol, kidney, and liver function. If we continue to have difficulty managing your high cholesterol, we may refer you to a heart specialist.

## 2022-11-24 NOTE — Assessment & Plan Note (Signed)
History of iron deficiency anemia. -Check blood counts, iron, B12, folate, and magnesium levels.

## 2022-11-24 NOTE — Assessment & Plan Note (Signed)
Weight Gain: Patient reports significant weight gain over the past few months. -Consider dietary counseling or weight management strategies if weight gain continues.

## 2022-11-25 LAB — IRON,TIBC AND FERRITIN PANEL
%SAT: 22 % (calc) (ref 20–48)
Ferritin: 127 ng/mL (ref 24–380)
Iron: 71 ug/dL (ref 50–180)
TIBC: 325 mcg/dL (calc) (ref 250–425)

## 2022-12-17 ENCOUNTER — Other Ambulatory Visit: Payer: Self-pay | Admitting: Family Medicine

## 2022-12-17 ENCOUNTER — Other Ambulatory Visit: Payer: Self-pay | Admitting: Interventional Cardiology

## 2022-12-17 DIAGNOSIS — L249 Irritant contact dermatitis, unspecified cause: Secondary | ICD-10-CM

## 2023-01-03 ENCOUNTER — Other Ambulatory Visit: Payer: Self-pay | Admitting: Interventional Cardiology

## 2023-01-04 ENCOUNTER — Other Ambulatory Visit (HOSPITAL_BASED_OUTPATIENT_CLINIC_OR_DEPARTMENT_OTHER): Payer: Self-pay | Admitting: *Deleted

## 2023-01-25 ENCOUNTER — Other Ambulatory Visit: Payer: Self-pay | Admitting: Interventional Cardiology

## 2023-01-25 DIAGNOSIS — U071 COVID-19: Secondary | ICD-10-CM | POA: Diagnosis not present

## 2023-01-25 DIAGNOSIS — R059 Cough, unspecified: Secondary | ICD-10-CM | POA: Diagnosis not present

## 2023-02-06 ENCOUNTER — Encounter: Payer: Self-pay | Admitting: Family Medicine

## 2023-02-06 ENCOUNTER — Ambulatory Visit (INDEPENDENT_AMBULATORY_CARE_PROVIDER_SITE_OTHER): Payer: 59 | Admitting: Family Medicine

## 2023-02-06 VITALS — BP 132/80 | HR 75 | Temp 98.2°F | Wt 238.4 lb

## 2023-02-06 DIAGNOSIS — U099 Post covid-19 condition, unspecified: Secondary | ICD-10-CM | POA: Insufficient documentation

## 2023-02-06 HISTORY — DX: Post covid-19 condition, unspecified: U09.9

## 2023-02-06 MED ORDER — FLUTICASONE PROPIONATE 50 MCG/ACT NA SUSP
2.0000 | Freq: Every day | NASAL | 6 refills | Status: AC
Start: 2023-02-06 — End: ?

## 2023-02-06 MED ORDER — GUAIFENESIN-DM 100-10 MG/5ML PO SYRP: 5.0000 mL | ORAL_SOLUTION | ORAL | 0 refills | Status: DC | PRN

## 2023-02-06 NOTE — Patient Instructions (Signed)
Stop taking medications with acetaminophen such as Equate Cold and Flu during the daytime.  Continue Chloreptic for your sore throat as needed. Use over-the-counter Robitussin for congestion and cough relief. Start using the prescribed Flonase (fluticasone) nasal spray to manage sinus congestion and postnasal drip. Monitor for any chest pain or shortness of breath, and seek medical attention if symptoms worsen. Drink plenty of fluids and rest to aid in your recovery from post-COVID symptoms.

## 2023-02-06 NOTE — Progress Notes (Signed)
Assessment/Plan:   Problem List Items Addressed This Visit       Other   Post-COVID syndrome - Primary    Symptoms indicate ongoing post-COVID recovery, including congestion, cough, and mild sore throat.  Plan: Discontinue NyQuil and DayQuil to decrease potential side effects of sedation and drowsiness Prescribe Fluticasone nasal spray for sinus congestion and postnasal drip. Recommend over-the-counter Robitussin for cough, which contains dextromethorphan and guaifenesin Continue other OTC supportive care as needed. ED and return precautions discussed Follow-up as needed      Relevant Medications   guaiFENesin-dextromethorphan (ROBITUSSIN DM) 100-10 MG/5ML syrup   fluticasone (FLONASE) 50 MCG/ACT nasal spray    There are no discontinued medications.  Return if symptoms worsen or fail to improve.    Subjective:   Encounter date: 02/06/2023  [name] is a 72 y.o. 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 [facility]); Chronic systolic (congestive) heart failure (HCC); CAD in native artery; Anoxic brain injury (HCC); Confusion; Essential hypertension; Need for hepatitis C screening test; Elevated serum creatinine; Iron deficiency anemia; Penile discharge; Well adult exam; History of tobacco use; Muscle cramps; Class 2 severe obesity with serious comorbidity and body mass index (BMI) of 39.0 to 39.9 in adult [facility]); Angina pectoris (HCC); Chronic pain of both knees; Hyperlipidemia; and Post-COVID syndrome on their problem list..   He  has a past medical history of ACS (acute coronary syndrome) (HCC), Acute anterior wall MI (HCC), Acute blood loss anemia, Acute heart  failure (HCC), Acute hypoxemic respiratory failure (HCC), Acute pulmonary edema (HCC), Acute respiratory failure with hypoxia (HCC), Aneurysm of left ventricle of heart (04/12/2017), Anoxic brain injury (HCC), Bacteremia, Benign essential HTN, CAD in native artery, Cardiogenic shock (HCC), CHF (congestive heart failure), NYHA class II, acute, combined (HCC) (04/11/2017), Chronic neck pain, Chronic systolic (congestive) heart failure (HCC), Confusion, Congestive heart failure (CHF) (HCC), COPD (chronic obstructive pulmonary disease) (HCC) (04/11/2017), Coronary artery disease, Dysphagia, Encounter for central line placement, Encounter for management of intra-aortic balloon pump, Essential hypertension, Fever, Hyperglycemia, Hypertension, Hypokalemia, Hypotension due to drugs, Hypoxemia, Labile blood pressure, Leukocytosis, Physical debility (04/27/2017), Rash (05/15/2017), Sepsis (HCC), Sore throat, STEMI (ST elevation myocardial infarction) (HCC) (04/11/2017), Supplemental oxygen dependent, Tachypnea, and 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   Chief Complaint: Ongoing symptoms post-COVID infection.  History of Present Illness: Post-COVID Symptoms. Patient first tested positive for COVID-19 on January 25, 2023. Initial symptoms included a hacking cough and significant congestion. The patient was prescribed the [facility] and underwent a chest X-ray which appeared normal. Despite some improvement, the patient still experiences a runny nose, cough, and sinus issues. The current symptoms include a stuffed-up nose, occasional coughing with phlegm (less frequent than initially). Reports ongoing drowsiness particularly after taking over-the-counter cold and flu medication.  His mild sore throat is improved by Chloraseptic spray.  Review of Systems  Constitutional:  Positive for malaise/fatigue (mild, improving). Negative for chills and fever.  HENT:  Positive for congestion  and sore throat. Negative for sinus pain.   Eyes:  Negative for blurred vision.  Respiratory:  Positive for cough and sputum production. Negative for shortness of breath and wheezing.   Cardiovascular:  Negative for chest pain, palpitations and leg swelling.  All other systems reviewed and are  negative.   Past Surgical History:  Procedure Laterality Date   APPLICATION OF WOUND VAC N/A 04/14/2017   Procedure: APPLICATION OF WOUND VAC with reposition of Impella device;  Surgeon: Delight Ovens, MD;  Location: [facility] OR;  Service: Thoracic;  Laterality: N/A;   CARDIAC CATHETERIZATION     CORONARY STENT INTERVENTION N/A 04/11/2017   Procedure: CORONARY STENT INTERVENTION;  Surgeon: Corky Crafts, MD;  Location: [facility] INVASIVE CV LAB;  Service: Cardiovascular;  Laterality: N/A;   CORONARY THROMBECTOMY N/A 04/11/2017   Procedure: Coronary Thrombectomy;  Surgeon: Corky Crafts, MD;  Location: [facility] INVASIVE CV LAB;  Service: Cardiovascular;  Laterality: N/A;   CORONARY/GRAFT ACUTE MI REVASCULARIZATION N/A 04/11/2017   Procedure: Coronary/Graft Acute MI Revascularization;  Surgeon: Corky Crafts, MD;  Location: [facility] INVASIVE CV LAB;  Service: Cardiovascular;  Laterality: N/A;   IABP INSERTION N/A 04/11/2017   Procedure: IABP Insertion;  Surgeon: Corky Crafts, MD;  Location: [facility] INVASIVE CV LAB;  Service: Cardiovascular;  Laterality: N/A;   LEFT HEART CATH AND CORONARY ANGIOGRAPHY N/A 04/11/2017   Procedure: LEFT HEART CATH AND CORONARY ANGIOGRAPHY;  Surgeon: Corky Crafts, MD;  Location: [facility] INVASIVE CV LAB;  Service: Cardiovascular;  Laterality: N/A;   PLACEMENT OF IMPELLA LEFT VENTRICULAR ASSIST DEVICE  04/12/2017   Procedure: PLACEMENT OF IMPELLA  LD;  Surgeon: Delight Ovens, MD;  Location: [facility] OR;  Service: Open Heart Surgery;;   REMOVAL OF IMPELLA LEFT VENTRICULAR ASSIST DEVICE N/A 04/17/2017   Procedure: REMOVAL OF IMPELLA LEFT VENTRICULAR ASSIST DEVICE;  Surgeon:  Delight Ovens, MD;  Location: [facility] OR;  Service: Open Heart Surgery;  Laterality: N/A;   REPAIR OF RIGHT VENTRICLE LACERATION Left 04/12/2017   Procedure: Resection of LV Aneurysm;  Surgeon: Delight Ovens, MD;  Location: [facility] OR;  Service: Open Heart Surgery;  Laterality: Left;   STERNAL CLOSURE N/A 04/14/2017   Procedure: STERNAL CLOSURE;  Surgeon: Delight Ovens, MD;  Location: [facility] OR;  Service: Thoracic;  Laterality: N/A;   TEE WITHOUT CARDIOVERSION N/A 04/14/2017   Procedure: TRANSESOPHAGEAL ECHOCARDIOGRAM (TEE);  Surgeon: Delight Ovens, MD;  Location: [facility] OR;  Service: Thoracic;  Laterality: N/A;   ULTRASOUND GUIDANCE FOR VASCULAR ACCESS  04/11/2017   Procedure: Ultrasound Guidance For Vascular Access;  Surgeon: Corky Crafts, MD;  Location: [facility] INVASIVE CV LAB;  Service: Cardiovascular;;    Outpatient Medications Prior to Visit  Medication Sig Dispense Refill   aspirin 81 MG chewable tablet Chew 81 mg by mouth daily.     carvedilol (COREG) 3.125 MG tablet TAKE 1 TABLET BY MOUTH TWICE A DAY WITH FOOD 60 tablet 0   Cholecalciferol (VITAMIN D3) 125 MCG (5000 UT) CAPS Take by mouth.     clopidogrel (PLAVIX) 75 MG tablet TAKE 1 TABLET BY MOUTH EVERY DAY 90 tablet 3   cyclobenzaprine (FLEXERIL) 5 MG tablet TAKE 1 TABLET BY MOUTH 2 TIMES DAILY AS NEEDED FOR MUSCLE SPASMS. 60 tablet 0   diclofenac Sodium (VOLTAREN) 1 % GEL Apply 4 g topically 4 (four) times daily as needed. 100 g 3   ENTRESTO 24-26 MG TAKE 1 TABLET BY MOUTH TWICE A DAY 60 tablet 0   famotidine (PEPCID) 20 MG tablet Take 1 tablet (20 mg total) by mouth at bedtime. 30 tablet 0   Iron, Ferrous Sulfate, 325 (65 Fe) MG TABS Take 325 mg by mouth daily. 180 tablet 1   LAGEVRIO 200 MG CAPS capsule Take by mouth 2 (two) times daily.  levocetirizine (XYZAL) 5 MG tablet TAKE 1 TABLET BY MOUTH EVERY DAY IN THE EVENING 90 tablet 1   Multiple Vitamins-Minerals (MULTIVITAMIN WITH MINERALS) tablet Take 1 tablet by mouth  daily.     nitroGLYCERIN (NITROSTAT) 0.4 MG SL tablet Place 1 tablet (0.4 mg total) under the tongue every 5 (five) minutes as needed for chest pain. 50 tablet 3   rosuvastatin (CRESTOR) 20 MG tablet Take 1 tablet (20 mg total) by mouth daily. 90 tablet 3   traMADol (ULTRAM) 50 MG tablet TAKE 1 TABLET BY MOUTH EVERY 8 HOURS AS NEEDED FOR UP TO 5 DAYS     No facility-administered medications prior to visit.    Family History  Problem Relation Age of Onset   Esophageal cancer Mother     Social History   Socioeconomic History   Marital status: Divorced    Spouse name: Not on file   Number of children: Not on file   Years of education: 14   Highest education level: Associate degree: occupational, Scientist, product/process development, or vocational program  Occupational History   Occupation: Retired  Tobacco Use   Smoking status: Former    Current packs/day: 0.00    Average packs/day: 1.5 packs/day for 53.0 years (79.5 ttl pk-yrs)    Types: Cigarettes    Start date: 04/11/1964    Quit date: 04/11/2017    Years since quitting: 5.8    Passive exposure: Never   Smokeless tobacco: Never  Vaping Use   Vaping status: Never Used  Substance and Sexual Activity   Alcohol use: Not Currently   Drug use: Never   Sexual activity: Not Currently  Other Topics Concern   Not on file  Social History Narrative   Not on file   Social Determinants of Health   Financial Resource Strain: Low Risk  (01/31/2021)   Overall Financial Resource Strain (CARDIA)    Difficulty of Paying Living Expenses: Not hard at all  Food Insecurity: Unknown (01/31/2021)   Hunger Vital Sign    Worried About Running Out of Food in the Last Year: Patient declined    Ran Out of Food in the Last Year: Patient declined  Transportation Needs: No Transportation Needs (01/31/2021)   PRAPARE - Administrator, Civil Service (Medical): No    Lack of Transportation (Non-Medical): No  Physical Activity: Unknown (01/31/2021)   Exercise Vital Sign     Days of Exercise per Week: Patient declined    Minutes of Exercise per Session: Patient declined  Stress: No Stress Concern Present (01/31/2021)   [employer] of Occupational Health - Occupational Stress Questionnaire    Feeling of Stress : Not at all  Social Connections: Moderately Isolated (01/31/2021)   Social Connection and Isolation Panel [NHANES]    Frequency of Communication with Friends and Family: More than three times a week    Frequency of Social Gatherings with Friends and Family: More than three times a week    Attends Religious Services: More than 4 times per year    Active Member of [employer] or Organizations: No    Attends Banker Meetings: Never    Marital Status: Divorced  Catering manager Violence: Not At Risk (01/31/2021)   Humiliation, Afraid, Rape, and Kick questionnaire    Fear of Current or Ex-Partner: No    Emotionally Abused: No    Physically Abused: No    Sexually Abused: No  Objective:  Physical Exam: BP 132/80 (BP Location: Left Arm, Patient Position: Sitting, Cuff Size: Large)   Pulse 75   Temp 98.2 F (36.8 C) (Oral)   Wt 238 lb 6.4 oz (108.1 kg)   SpO2 97%   BMI 38.48 kg/m     Physical Exam Constitutional:      Appearance: Normal appearance.  HENT:     Head: Normocephalic and atraumatic.     Right Ear: Hearing normal.     Left Ear: Hearing normal.     Nose: Nose normal.  Eyes:     General: No scleral icterus.       Right eye: No discharge.        Left eye: No discharge.     Extraocular Movements: Extraocular movements intact.  Cardiovascular:     Rate and Rhythm: Normal rate and regular rhythm.     Heart sounds: Normal heart sounds.  Pulmonary:     Effort: Pulmonary effort is normal.     Breath sounds: Normal breath sounds.  Abdominal:     Palpations: Abdomen is soft.     Tenderness: There is no abdominal tenderness.   Musculoskeletal:     Right lower leg: No edema.     Left lower leg: No edema.  Skin:    General: Skin is warm.     Findings: No rash.  Neurological:     General: No focal deficit present.     Mental Status: He is alert.     Cranial Nerves: No cranial nerve deficit.  Psychiatric:        Mood and Affect: Mood normal.        Behavior: Behavior normal.        Thought Content: Thought content normal.        Judgment: Judgment normal.     No results found.  Recent Results (from the past 2160 hour(s))  TSH     Status: None   Collection Time: 11/24/22  2:00 PM  Result Value Ref Range   TSH 1.28 0.35 - 5.50 uIU/mL  Lipid panel     Status: Abnormal   Collection Time: 11/24/22  2:00 PM  Result Value Ref Range   Cholesterol 124 0 - 200 mg/dL    Comment: ATP III Classification       Desirable:  < 200 mg/dL               Borderline High:  200 - 239 mg/dL          High:  > = 272 mg/dL   Triglycerides 53.6 0.0 - 149.0 mg/dL    Comment: Normal:  <644 mg/dLBorderline High:  150 - 199 mg/dL   HDL 03.47 (L) >42.59 mg/dL   VLDL 56.3 0.0 - 87.5 mg/dL   LDL Cholesterol 78 0 - 99 mg/dL   Total CHOL/HDL Ratio 4     Comment:                Men          Women1/2 Average Risk     3.4          3.3Average Risk          5.0          4.42X Average Risk          9.6          7.13X Average Risk          15.0  11.0                       NonHDL 96.05     Comment: NOTE:  Non-HDL goal should be 30 mg/dL higher than patient's LDL goal (i.e. LDL goal of < 70 mg/dL, would have non-HDL goal of < 100 mg/dL)  Hemoglobin Q5Z     Status: None   Collection Time: 11/24/22  2:00 PM  Result Value Ref Range   Hgb A1c MFr Bld 6.3 4.6 - 6.5 %    Comment: Glycemic Control Guidelines for People with Diabetes:Non Diabetic:  <6%Goal of Therapy: <7%Additional Action Suggested:  >8%   Microalbumin / creatinine urine ratio     Status: None   Collection Time: 11/24/22  2:00 PM  Result Value Ref Range   Microalb, Ur <0.7  0.0 - 1.9 mg/dL   Creatinine,U 56.3 mg/dL   Microalb Creat Ratio 1.6 0.0 - 30.0 mg/g  Urinalysis, Routine w reflex microscopic     Status: None   Collection Time: 11/24/22  2:00 PM  Result Value Ref Range   Color, Urine YELLOW Yellow;Lt. Yellow;Straw;Dark Yellow;Amber;Green;Red;Brown   APPearance CLEAR Clear;Turbid;Slightly Cloudy;Cloudy   Specific Gravity, Urine 1.010 1.000 - 1.030   pH 6.0 5.0 - 8.0   Total Protein, Urine NEGATIVE Negative   Urine Glucose NEGATIVE Negative   Ketones, ur NEGATIVE Negative   Bilirubin Urine NEGATIVE Negative   Hgb urine dipstick NEGATIVE Negative   Urobilinogen, UA 0.2 0.0 - 1.0   Leukocytes,Ua NEGATIVE Negative   Nitrite NEGATIVE Negative   WBC, UA none seen 0-2/hpf   RBC / HPF none seen 0-2/hpf   Squamous Epithelial / HPF Rare(0-4/hpf) Rare(0-4/hpf)  CBC with Differential/Platelet     Status: None   Collection Time: 11/24/22  2:00 PM  Result Value Ref Range   WBC 8.0 4.0 - 10.5 K/uL   RBC 4.79 4.22 - 5.81 Mil/uL   Hemoglobin 13.3 13.0 - 17.0 g/dL   HCT 87.5 64.3 - 32.9 %   MCV 87.7 78.0 - 100.0 fl   MCHC 31.6 30.0 - 36.0 g/dL   RDW 51.8 84.1 - 66.0 %   Platelets 204.0 150.0 - 400.0 K/uL   Neutrophils Relative % 58.9 43.0 - 77.0 %   Lymphocytes Relative 26.7 12.0 - 46.0 %   Monocytes Relative 11.5 3.0 - 12.0 %   Eosinophils Relative 2.4 0.0 - 5.0 %   Basophils Relative 0.5 0.0 - 3.0 %   Neutro Abs 4.7 1.4 - 7.7 K/uL   Lymphs Abs 2.1 0.7 - 4.0 K/uL   Monocytes Absolute 0.9 0.1 - 1.0 K/uL   Eosinophils Absolute 0.2 0.0 - 0.7 K/uL   Basophils Absolute 0.0 0.0 - 0.1 K/uL  Comprehensive metabolic panel     Status: None   Collection Time: 11/24/22  2:00 PM  Result Value Ref Range   Sodium 141 135 - 145 mEq/L   Potassium 4.9 3.5 - 5.1 mEq/L   Chloride 105 96 - 112 mEq/L   CO2 30 19 - 32 mEq/L   Glucose, Bld 90 70 - 99 mg/dL   BUN 18 6 - 23 mg/dL   Creatinine, Ser 6.30 0.40 - 1.50 mg/dL   Total Bilirubin 0.5 0.2 - 1.2 mg/dL   Alkaline  Phosphatase 79 39 - 117 U/L   AST 16 0 - 37 U/L   ALT 16 0 - 53 U/L   Total Protein 7.0 6.0 - 8.3 g/dL   Albumin 4.0 3.5 -  5.2 g/dL   GFR 16.10 >96.04 mL/min    Comment: Calculated using the CKD-EPI Creatinine Equation (2021)   Calcium 9.3 8.4 - 10.5 mg/dL  Magnesium     Status: None   Collection Time: 11/24/22  2:00 PM  Result Value Ref Range   Magnesium 2.0 1.5 - 2.5 mg/dL  V40 and Folate Panel     Status: Abnormal   Collection Time: 11/24/22  2:00 PM  Result Value Ref Range   Vitamin B-12 1,003 (H) 211 - 911 pg/mL   Folate >23.8 >5.9 ng/mL  CK (Creatine Kinase)     Status: None   Collection Time: 11/24/22  2:00 PM  Result Value Ref Range   Total CK 48 7 - 232 U/L  Iron, TIBC and Ferritin Panel     Status: None   Collection Time: 11/24/22  2:00 PM  Result Value Ref Range   Iron 71 50 - 180 mcg/dL   TIBC 981 191 - 478 mcg/dL (calc)   %SAT 22 20 - 48 % (calc)   Ferritin 127 24 - 380 ng/mL        Garner Nash, MD, MS

## 2023-02-06 NOTE — Assessment & Plan Note (Signed)
Symptoms indicate ongoing post-COVID recovery, including congestion, cough, and mild sore throat.  Plan: Discontinue NyQuil and DayQuil to decrease potential side effects of sedation and drowsiness Prescribe Fluticasone nasal spray for sinus congestion and postnasal drip. Recommend over-the-counter Robitussin for cough, which contains dextromethorphan and guaifenesin Continue other OTC supportive care as needed. ED and return precautions discussed Follow-up as needed

## 2023-02-19 ENCOUNTER — Other Ambulatory Visit: Payer: Self-pay | Admitting: Interventional Cardiology

## 2023-03-19 ENCOUNTER — Other Ambulatory Visit: Payer: Self-pay | Admitting: Interventional Cardiology

## 2023-03-23 ENCOUNTER — Telehealth: Payer: Self-pay | Admitting: Family Medicine

## 2023-03-23 DIAGNOSIS — I5041 Acute combined systolic (congestive) and diastolic (congestive) heart failure: Secondary | ICD-10-CM

## 2023-03-23 MED ORDER — ENTRESTO 24-26 MG PO TABS
1.0000 | ORAL_TABLET | Freq: Two times a day (BID) | ORAL | 0 refills | Status: AC
Start: 2023-03-23 — End: 2023-06-21

## 2023-03-23 NOTE — Telephone Encounter (Signed)
Please see the MyChart message reply(ies) for my assessment and plan.    This patient gave consent for this Medical Advice Message and is aware that it may result in a bill to [employer], as well as the possibility of receiving a bill for a co-payment or deductible. They are an established patient, but are not seeking medical advice exclusively about a problem treated during an in person or video visit in the last seven days. I did not recommend an in person or video visit within seven days of my reply.     Refilled Entresto for 3 months.  I would not be able to place further refills given the necessity to be managed by cardiology.  Placed referral to cardiology. I spent a total of 10 minutes cumulative time within 7 days through [employer].  Garnette Gunner, MD

## 2023-03-23 NOTE — Telephone Encounter (Signed)
[name] [phone]  sacubitril-valsartan (ENTRESTO) 24-26 [place] [[id number]]  Pt stated that his cardiologist is moving and will no longer be with [facility]. He has 4 pills left and is asking if you can refill for him. I suggested that he may need to find another cardiologist but I can not answer for you and that I would pass his message along.   [id number] [place], [place] - [address] AT [facility] [address] [place], [place] [address] Phone: [phone]  Fax: [phone]

## 2023-03-30 ENCOUNTER — Other Ambulatory Visit: Payer: Self-pay | Admitting: Nurse Practitioner

## 2023-04-18 ENCOUNTER — Ambulatory Visit (INDEPENDENT_AMBULATORY_CARE_PROVIDER_SITE_OTHER): Payer: 59 | Admitting: Family Medicine

## 2023-04-18 ENCOUNTER — Encounter: Payer: Self-pay | Admitting: Family Medicine

## 2023-04-18 VITALS — BP 124/86 | HR 70 | Temp 97.9°F | Wt 248.0 lb

## 2023-04-18 DIAGNOSIS — Z23 Encounter for immunization: Secondary | ICD-10-CM

## 2023-04-18 DIAGNOSIS — W5503XA Scratched by cat, initial encounter: Secondary | ICD-10-CM | POA: Diagnosis not present

## 2023-04-18 DIAGNOSIS — L03119 Cellulitis of unspecified part of limb: Secondary | ICD-10-CM | POA: Diagnosis not present

## 2023-04-18 MED ORDER — AMOXICILLIN-POT CLAVULANATE 875-125 MG PO TABS: 1.0000 | ORAL_TABLET | Freq: Two times a day (BID) | ORAL | 0 refills | Status: AC

## 2023-04-18 NOTE — Progress Notes (Unsigned)
Assessment/Plan:   Problem List Items Addressed This Visit   None Visit Diagnoses     Cat scratch    -  Primary   Relevant Medications   amoxicillin-clavulanate (AUGMENTIN) 875-125 MG tablet   Other Relevant Orders   Td : Tetanus/diphtheria >74yo Preservative  free   Need for immunization against tetanus alone       Relevant Orders   Td : Tetanus/diphtheria >74yo Preservative  free   Cellulitis of lower extremity, unspecified laterality       Relevant Orders   Td : Tetanus/diphtheria >74yo Preservative  free       There are no discontinued medications.  Return in about 3 months (around 07/19/2023) for BP, HLD.    Subjective:   Encounter date: 04/18/2023  [name] is a 72 y.o. 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   He  has a past medical history of ACS (acute coronary syndrome) (HCC), Acute anterior wall MI (HCC), Acute blood loss anemia, Acute heart failure (HCC), Acute hypoxemic respiratory failure (HCC), Acute pulmonary edema (HCC), Acute respiratory failure with hypoxia (HCC), Aneurysm of left ventricle of heart (04/12/2017), Anoxic brain  injury (HCC), Bacteremia, Benign essential HTN, CAD in native artery, Cardiogenic shock (HCC), CHF (congestive heart failure), NYHA class II, acute, combined (HCC) (04/11/2017), Chronic neck pain, Chronic systolic (congestive) heart failure (HCC), Confusion, Congestive heart failure (CHF) (HCC), COPD (chronic obstructive pulmonary disease) (HCC) (04/11/2017), Coronary artery disease, Dysphagia, Encounter for central line placement, Encounter for management of intra-aortic balloon pump, Essential hypertension, Fever, Hyperglycemia, Hypertension, Hypokalemia, Hypotension due to drugs, Hypoxemia, Labile blood pressure, Leukocytosis, Physical debility (04/27/2017), Rash (05/15/2017), Sepsis (HCC), Sore throat, STEMI (ST elevation myocardial infarction) (HCC) (04/11/2017), Supplemental oxygen dependent, Tachypnea, and 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   HPI:   ROS  Past Surgical History:  Procedure Laterality Date  . APPLICATION OF WOUND VAC N/A 04/14/2017   Procedure: APPLICATION OF WOUND VAC with reposition of Impella device;  Surgeon: Delight Ovens, MD;  Location: [facility] OR;  Service: Thoracic;  Laterality: N/A;  . CARDIAC CATHETERIZATION    . CORONARY STENT INTERVENTION N/A 04/11/2017   Procedure: CORONARY STENT INTERVENTION;  Surgeon: Corky Crafts, MD;  Location: [facility] INVASIVE CV LAB;  Service: Cardiovascular;  Laterality: N/A;  . CORONARY THROMBECTOMY N/A 04/11/2017   Procedure: Coronary Thrombectomy;  Surgeon: Corky Crafts, MD;  Location: [facility] INVASIVE CV LAB;  Service: Cardiovascular;  Laterality: N/A;  . CORONARY/GRAFT ACUTE MI REVASCULARIZATION N/A 04/11/2017   Procedure: Coronary/Graft Acute MI Revascularization;  Surgeon: Corky Crafts, MD;  Location: [facility] INVASIVE CV LAB;  Service: Cardiovascular;  Laterality: N/A;  .  IABP  INSERTION N/A 04/11/2017   Procedure: IABP Insertion;  Surgeon: Corky Crafts, MD;  Location: [facility] INVASIVE CV LAB;  Service: Cardiovascular;  Laterality: N/A;  . LEFT HEART CATH AND CORONARY ANGIOGRAPHY N/A 04/11/2017   Procedure: LEFT HEART CATH AND CORONARY ANGIOGRAPHY;  Surgeon: Corky Crafts, MD;  Location: [facility] INVASIVE CV LAB;  Service: Cardiovascular;  Laterality: N/A;  . PLACEMENT OF IMPELLA LEFT VENTRICULAR ASSIST DEVICE  04/12/2017   Procedure: PLACEMENT OF IMPELLA  LD;  Surgeon: Delight Ovens, MD;  Location: [facility] OR;  Service: Open Heart Surgery;;  . REMOVAL OF IMPELLA LEFT VENTRICULAR ASSIST DEVICE N/A 04/17/2017   Procedure: REMOVAL OF IMPELLA LEFT VENTRICULAR ASSIST DEVICE;  Surgeon: Delight Ovens, MD;  Location: [facility] OR;  Service: Open Heart Surgery;  Laterality: N/A;  . REPAIR OF RIGHT VENTRICLE LACERATION Left 04/12/2017   Procedure: Resection of LV Aneurysm;  Surgeon: Delight Ovens, MD;  Location: [facility] OR;  Service: Open Heart Surgery;  Laterality: Left;  . STERNAL CLOSURE N/A 04/14/2017   Procedure: STERNAL CLOSURE;  Surgeon: Delight Ovens, MD;  Location: [facility] OR;  Service: Thoracic;  Laterality: N/A;  . TEE WITHOUT CARDIOVERSION N/A 04/14/2017   Procedure: TRANSESOPHAGEAL ECHOCARDIOGRAM (TEE);  Surgeon: Delight Ovens, MD;  Location: [facility] OR;  Service: Thoracic;  Laterality: N/A;  . ULTRASOUND GUIDANCE FOR VASCULAR ACCESS  04/11/2017   Procedure: Ultrasound Guidance For Vascular Access;  Surgeon: Corky Crafts, MD;  Location: [facility] INVASIVE CV LAB;  Service: Cardiovascular;;    Outpatient Medications Prior to Visit  Medication Sig Dispense Refill  . aspirin 81 MG chewable tablet Chew 81 mg by mouth daily.    . carvedilol (COREG) 3.125 MG tablet TAKE 1 TABLET BY MOUTH TWICE A DAY WITH FOOD 60 tablet 0  . Cholecalciferol (VITAMIN D3) 125 MCG (5000 UT) CAPS Take by mouth.    . clopidogrel (PLAVIX) 75 MG tablet Take 1 tablet (75 mg total) by  mouth daily. t. 30 tablet 0  . diclofenac Sodium (VOLTAREN) 1 % GEL Apply 4 g topically 4 (four) times daily as needed. 100 g 3  . fluticasone (FLONASE) 50 MCG/ACT nasal spray Place 2 sprays into both nostrils daily. 16 g 6  . Iron, Ferrous Sulfate, 325 (65 Fe) MG TABS Take 325 mg by mouth daily. 180 tablet 1  . levocetirizine (XYZAL) 5 MG tablet TAKE 1 TABLET BY MOUTH EVERY DAY IN THE EVENING 90 tablet 1  . Multiple Vitamins-Minerals (MULTIVITAMIN WITH MINERALS) tablet Take 1 tablet by mouth daily.    . nitroGLYCERIN (NITROSTAT) 0.4 MG SL tablet Place 1 tablet (0.4 mg total) under the tongue every 5 (five) minutes as needed for chest pain. 50 tablet 3  . rosuvastatin (CRESTOR) 20 MG tablet Take 1 tablet (20 mg total) by mouth daily. 90 tablet 3  . sacubitril-valsartan (ENTRESTO) 24-26 MG Take 1 tablet by mouth 2 (two) times daily. TAKE 1 TABLET BY MOUTH TWICE A DAY 180 tablet 0  . traMADol (ULTRAM) 50 MG tablet TAKE 1 TABLET BY MOUTH EVERY 8 HOURS AS NEEDED FOR UP TO 5 DAYS    . cyclobenzaprine (FLEXERIL) 5 MG tablet TAKE 1 TABLET BY MOUTH 2 TIMES DAILY AS NEEDED FOR MUSCLE SPASMS. (Patient not taking: Reported on 04/18/2023) 60 tablet 0  . famotidine (PEPCID) 20 MG tablet Take 1 tablet (20 mg total) by mouth at bedtime. (Patient not taking: Reported on 04/18/2023) 30 tablet 0  . guaiFENesin-dextromethorphan (ROBITUSSIN DM) 100-10 MG/5ML syrup Take 5 mLs  by mouth every 4 (four) hours as needed for cough. (Patient not taking: Reported on 04/18/2023) 118 mL 0  . LAGEVRIO 200 MG CAPS capsule Take by mouth 2 (two) times daily. (Patient not taking: Reported on 04/18/2023)     No facility-administered medications prior to visit.    Family History  Problem Relation Age of Onset  . Esophageal cancer Mother     Social History   Socioeconomic History  . Marital status: Divorced    Spouse name: Not on file  . Number of children: Not on file  . Years of education: 75  . Highest education level:  Associate degree: occupational, Scientist, product/process development, or vocational program  Occupational History  . Occupation: Retired  Tobacco Use  . Smoking status: Former    Current packs/day: 0.00    Average packs/day: 1.5 packs/day for 53.0 years (79.5 ttl pk-yrs)    Types: Cigarettes    Start date: 04/11/1964    Quit date: 04/11/2017    Years since quitting: 6.0    Passive exposure: Never  . Smokeless tobacco: Never  Vaping Use  . Vaping status: Never Used  Substance and Sexual Activity  . Alcohol use: Not Currently  . Drug use: Never  . Sexual activity: Not Currently  Other Topics Concern  . Not on file  Social History Narrative  . Not on file   Social Determinants of Health   Financial Resource Strain: Low Risk  (01/31/2021)   Overall Financial Resource Strain (CARDIA)   . Difficulty of Paying Living Expenses: Not hard at all  Food Insecurity: Unknown (01/31/2021)   Hunger Vital Sign   . Worried About Programme researcher, broadcasting/film/video in the Last Year: Patient declined   . Ran Out of Food in the Last Year: Patient declined  Transportation Needs: No Transportation Needs (01/31/2021)   PRAPARE - Transportation   . Lack of Transportation (Medical): No   . Lack of Transportation (Non-Medical): No  Physical Activity: Unknown (01/31/2021)   Exercise Vital Sign   . Days of Exercise per Week: Patient declined   . Minutes of Exercise per Session: Patient declined  Stress: No Stress Concern Present (01/31/2021)   [employer] of Occupational Health - Occupational Stress Questionnaire   . Feeling of Stress : Not at all  Social Connections: Moderately Isolated (01/31/2021)   Social Connection and Isolation Panel [NHANES]   . Frequency of Communication with Friends and Family: More than three times a week   . Frequency of Social Gatherings with Friends and Family: More than three times a week   . Attends Religious Services: More than 4 times per year   . Active Member of Clubs or Organizations: No   . Attends Tax inspector Meetings: Never   . Marital Status: Divorced  Catering manager Violence: Not At Risk (01/31/2021)   Humiliation, Afraid, Rape, and Kick questionnaire   . Fear of Current or Ex-Partner: No   . Emotionally Abused: No   . Physically Abused: No   . Sexually Abused: No  Objective:  Physical Exam: BP 124/86 (BP Location: Left Arm, Patient Position: Sitting, Cuff Size: Large)   Pulse 70   Temp 97.9 F (36.6 C) (Temporal)   Wt 248 lb (112.5 kg)   SpO2 98%   BMI 40.03 kg/m     Physical Exam  No results found.  No results found for this or any previous visit (from the past 2160 hour(s)).      Garner Nash, MD, MS

## 2023-04-18 NOTE — Patient Instructions (Addendum)
Take Augmentin twice a day for 7 days.

## 2023-04-19 DIAGNOSIS — I2510 Atherosclerotic heart disease of native coronary artery without angina pectoris: Secondary | ICD-10-CM | POA: Insufficient documentation

## 2023-04-19 DIAGNOSIS — Z4509 Encounter for adjustment and management of other cardiac device: Secondary | ICD-10-CM | POA: Insufficient documentation

## 2023-04-19 DIAGNOSIS — I10 Essential (primary) hypertension: Secondary | ICD-10-CM | POA: Insufficient documentation

## 2023-04-20 ENCOUNTER — Ambulatory Visit: Payer: 59 | Admitting: Cardiology

## 2023-04-20 DIAGNOSIS — L03119 Cellulitis of unspecified part of limb: Secondary | ICD-10-CM | POA: Insufficient documentation

## 2023-04-20 NOTE — Assessment & Plan Note (Addendum)
Start Augmentin 875?mg/125?mg orally twice daily for 7 days Administer Td vaccine Educate patient on proper wound care: Keep affected areas clean and dry. avoid scratching or irritating the lesions. Monitor for signs of worsening infection. Advise minimizing further scratches: trim the cat's nails regularly, consider using protective clothing  Follow up if no improvement Advised to Seek immediate medical attention if symptoms significantly worsen, including increased redness, swelling, pain, fever, or drainage.

## 2023-05-08 ENCOUNTER — Telehealth: Payer: Self-pay | Admitting: Family Medicine

## 2023-05-08 ENCOUNTER — Telehealth: Payer: Self-pay | Admitting: Cardiology

## 2023-05-08 MED ORDER — CARVEDILOL 3.125 MG PO TABS: 3.1250 mg | ORAL_TABLET | Freq: Two times a day (BID) | ORAL | 0 refills | Status: DC

## 2023-05-08 NOTE — Telephone Encounter (Signed)
[name] will you please call him about this med carvedilol (COREG) 3.125 MG tablet [[id number]] he says he has 4 pills left. I was not sure what to tell him being it wasn't prescribed by Dr Janee Morn. He has many questions and concerns.  Dannell [phone]

## 2023-05-08 NOTE — Telephone Encounter (Signed)
*  STAT* If patient is at the pharmacy, call can be transferred to refill team.   1. Which medications need to be refilled? (please list name of each medication and dose if known) carvedilol (COREG) 3.125 MG tablet    2. Would you like to learn more about the convenience, safety, & potential cost savings by using the [facility]?      3. Are you open to using the [facility] (Type [facility]. ).   4. Which pharmacy/location (including street and city if local pharmacy) is medication to be sent to? [id number] - [place], [place] - [address]    5. Do they need a 30 day or 90 day supply? 90

## 2023-05-10 ENCOUNTER — Other Ambulatory Visit: Payer: Self-pay | Admitting: Physician Assistant

## 2023-05-10 NOTE — Telephone Encounter (Addendum)
Medication was refilled by Cardiology on 05/08/23

## 2023-05-10 NOTE — Addendum Note (Signed)
Addended by: Dorris Fetch on: 05/10/2023 02:41 PM   Modules accepted: Orders

## 2023-05-24 ENCOUNTER — Other Ambulatory Visit: Payer: Self-pay | Admitting: Physician Assistant

## 2023-05-25 DIAGNOSIS — L03116 Cellulitis of left lower limb: Secondary | ICD-10-CM | POA: Diagnosis not present

## 2023-06-04 ENCOUNTER — Other Ambulatory Visit: Payer: Self-pay | Admitting: Cardiology

## 2023-06-09 ENCOUNTER — Ambulatory Visit (HOSPITAL_BASED_OUTPATIENT_CLINIC_OR_DEPARTMENT_OTHER): Payer: 59 | Admitting: Cardiology

## 2023-06-19 ENCOUNTER — Ambulatory Visit (INDEPENDENT_AMBULATORY_CARE_PROVIDER_SITE_OTHER): Payer: 59 | Admitting: Cardiology

## 2023-06-19 VITALS — BP 110/60 | HR 74 | Ht 67.0 in | Wt 241.0 lb

## 2023-06-19 DIAGNOSIS — I253 Aneurysm of heart: Secondary | ICD-10-CM | POA: Diagnosis not present

## 2023-06-19 DIAGNOSIS — I2101 ST elevation (STEMI) myocardial infarction involving left main coronary artery: Secondary | ICD-10-CM

## 2023-06-19 DIAGNOSIS — I255 Ischemic cardiomyopathy: Secondary | ICD-10-CM | POA: Diagnosis not present

## 2023-06-19 DIAGNOSIS — I5022 Chronic systolic (congestive) heart failure: Secondary | ICD-10-CM

## 2023-06-19 DIAGNOSIS — I5041 Acute combined systolic (congestive) and diastolic (congestive) heart failure: Secondary | ICD-10-CM

## 2023-06-19 DIAGNOSIS — I509 Heart failure, unspecified: Secondary | ICD-10-CM | POA: Diagnosis not present

## 2023-06-19 NOTE — Patient Instructions (Signed)
Medication Instructions:  Your physician recommends that you continue on your current medications as directed. Please refer to the Current Medication list given to you today.  *If you need a refill on your cardiac medications before your next appointment, please call your pharmacy*   Lab Work: NONE If you have labs (blood work) drawn today and your tests are completely normal, you will receive your results only by: MyChart Message (if you have MyChart) OR A paper copy in the mail If you have any lab test that is abnormal or we need to change your treatment, we will call you to review the results.   Testing/Procedures: NONE   Follow-Up: At [facility], you and your health needs are our priority.  As part of our continuing mission to provide you with exceptional heart care, we have created designated Provider Care Teams.  These Care Teams include your primary Cardiologist (physician) and Advanced Practice Providers (APPs -  Physician Assistants and Nurse Practitioners) who all work together to provide you with the care you need, when you need it.  We recommend signing up for the patient portal called "MyChart".  Sign up information is provided on this After Visit Summary.  MyChart is used to connect with patients for Virtual Visits (Telemedicine).  Patients are able to view lab/test results, encounter notes, upcoming appointments, etc.  Non-urgent messages can be sent to your provider as well.   To learn more about what you can do with MyChart, go to [email or web address].    Your next appointment:   12 month(s)  Provider:   NP/PA   Other Instructions

## 2023-06-19 NOTE — Progress Notes (Signed)
Cardiology Office Note:  .   Date:  06/19/2023  ID:  [name], DOB 12-Jun-1950, MRN [id number] PCP: Garnette Gunner, MD  [facility] Providers Cardiologist:  Donato Schultz, MD     History of Present Illness: [name]   [name] is a 73 y.o. male Discussed with the use of AI scribe   History of Present Illness   The patient, a 73 year old with a history of ischemic cardiomyopathy, presented for a follow-up visit. He had an inferior ST elevation myocardial infarction in November 2018, which was treated with a drug-eluting stent to the RCA. This was complicated by an LV pseudoaneurysm and an attempted PCI to the LAD, which failed due to chronic occlusion. The patient also has chronic systolic heart failure secondary to cardiogenic shock, with an EF that improved from 25% to 55% as of March 2019. He has a history of hypertension and paroxysmal atrial fibrillation, which has converted to normal sinus rhythm. Amiodarone was stopped due to past bleeding issues, and the patient declined stronger anticoagulants. He also has COPD but has quit smoking and maintains a healthy diet.  The patient's mobility is limited by hip and back pain. He takes clopidogrel daily and uses aspirin rarely. He had an LAD intervention with poor runoff but appears euvolemic and is maintaining sinus rhythm.  The patient reported that he has been trying to lose weight and is taking his medications as prescribed, including low-dose carvedilol and Entresto. He reported that he reduced the frequency of carvedilol to once daily in the morning on the advice of his primary care physician, which improved his symptoms. His LDL is 78, slightly above the target of 70, despite being on rosuvastatin.  The patient also reported issues with his back and hips, which limit his ability to exercise. He has received a shot of Visco supplementation for his knee after an injury caused by twisting it while getting into his car. He has an upcoming  appointment with an orthopedic doctor for this issue.  Despite these health issues, the patient is active and busy, caring for his bedridden sister and a brother with mental disabilities. He expressed a desire to lose weight and continue to manage his health conditions effectively.          ROS: No CP, no SOB  Studies Reviewed: [name]   EKG Interpretation Date/Time:  Monday June 19 2023 11:27:55 EST Ventricular Rate:  79 PR Interval:  154 QRS Duration:  86 QT Interval:  380 QTC Calculation: 435 R Axis:   113  Text Interpretation: Normal sinus rhythm Possible Left atrial enlargement Right axis deviation T wave abnormality, consider inferior ischemia T wave abnormality, consider anterolateral ischemia When compared with ECG of 27-Sep-2017 13:44, Criteria for Anterolateral infarct are no longer Present Minimal criteria for Inferior infarct are no longer Present Confirmed by Donato Schultz ([id number]) on 06/19/2023 11:44:07 AM    Results   LABS LDL: 78 Creatinine: 1.18  DIAGNOSTIC Echocardiography: EF 55% (08/22/2017) EKG: Normal sinus rhythm     Risk Assessment/Calculations:            Physical Exam:   VS:  BP 110/60 (BP Location: Left Arm, Patient Position: Sitting, Cuff Size: Large)   Pulse 74   Ht 5\' 7"  (1.702 m)   Wt 241 lb (109.3 kg)   SpO2 96%   BMI 37.75 kg/m    Wt Readings from Last 3 Encounters:  06/19/23 241 lb (109.3 kg)  04/18/23 248 lb (112.5 kg)  02/06/23  238 lb 6.4 oz (108.1 kg)    GEN: Well nourished, well developed in no acute distress NECK: No JVD; No carotid bruits CARDIAC: RRR, no murmurs, no rubs, no gallops RESPIRATORY:  Clear to auscultation without rales, wheezing or rhonchi  ABDOMEN: Soft, non-tender, non-distended EXTREMITIES:  No edema; No deformity   ASSESSMENT AND PLAN: .    Assessment and Plan    Ischemic Cardiomyopathy Follow-up for ischemic cardiomyopathy with a history of inferior STEMI on 04/18/2017, treated with a drug-eluting stent  to the RCA, complicated by LV pseudoaneurysm and failed PCI to the LAD due to chronic occlusion and extensive infarct. Chronic systolic heart failure secondary to cardiogenic shock with an EF improved from 25% to 55% on echo as of 08/22/2017. Currently maintaining sinus rhythm and appears euvolemic. Discussed the importance of continuing current medications to maintain cardiac function and prevent further complications. Patient prefers not to be on stronger anticoagulants due to past bleeding issues. - Continue clopidogrel 75 mg daily, aspirin 81 mg as needed, carvedilol 3.125 mg once daily, Entresto (dose not specified), rosuvastatin 20 mg daily - Encourage Mediterranean diet and exercise - Schedule follow-up in one year with APP  Paroxysmal Atrial Fibrillation Paroxysmal atrial fibrillation, currently in normal sinus rhythm. Amiodarone was previously stopped due to bleeding issues. Discussed the importance of monitoring for recurrence and maintaining sinus rhythm. - Monitor for atrial fibrillation recurrence  Hypertension Hypertension, well-managed with carvedilol and other medications. Blood pressure appears stable. Discussed the importance of maintaining current antihypertensive regimen to prevent cardiovascular events. - Continue current antihypertensive regimen  Hyperlipidemia LDL cholesterol is 78 mg/dL, close to the target of less than 70 mg/dL. Currently on rosuvastatin 20 mg daily. Discussed dietary modifications to further reduce LDL levels and the benefits of maintaining target cholesterol levels to prevent cardiovascular events. - Continue rosuvastatin 20 mg daily - Encourage dietary modifications to reduce LDL further  Chronic Obstructive Pulmonary Disease (COPD) COPD, patient has quit smoking. Discussed the importance of avoiding smoking to prevent exacerbations and maintain respiratory function. - Continue to avoid smoking - Monitor respiratory status  General Health  Maintenance Patient is generally maintaining good health practices, including a healthy diet. Limited exercise due to hip and back pain. Discussed the importance of weight loss and addressing musculoskeletal issues to improve overall health. - Encourage weight loss - Continue healthy diet - Address hip and back pain with appropriate specialists  Follow-up - Schedule follow-up in one year with APP - Provide summary page to patient.              Signed, Donato Schultz, MD

## 2023-07-05 ENCOUNTER — Other Ambulatory Visit: Payer: Self-pay | Admitting: Cardiology

## 2023-07-05 MED ORDER — CLOPIDOGREL BISULFATE 75 MG PO TABS
75.0000 mg | ORAL_TABLET | Freq: Every day | ORAL | 3 refills | Status: DC
Start: 2023-07-05 — End: 2024-02-22

## 2023-07-05 NOTE — Addendum Note (Signed)
 Addended by: Gayleen Kawasaki D on: 07/05/2023 10:40 AM   Modules accepted: Orders

## 2023-07-13 ENCOUNTER — Other Ambulatory Visit: Payer: Self-pay | Admitting: Cardiology

## 2023-07-19 ENCOUNTER — Encounter: Payer: Self-pay | Admitting: Family Medicine

## 2023-07-19 ENCOUNTER — Telehealth (INDEPENDENT_AMBULATORY_CARE_PROVIDER_SITE_OTHER): Payer: 59 | Admitting: Family Medicine

## 2023-07-19 VITALS — Ht 67.0 in | Wt 238.0 lb

## 2023-07-19 DIAGNOSIS — L97929 Non-pressure chronic ulcer of unspecified part of left lower leg with unspecified severity: Secondary | ICD-10-CM | POA: Diagnosis not present

## 2023-07-19 DIAGNOSIS — I2510 Atherosclerotic heart disease of native coronary artery without angina pectoris: Secondary | ICD-10-CM

## 2023-07-19 DIAGNOSIS — I5041 Acute combined systolic (congestive) and diastolic (congestive) heart failure: Secondary | ICD-10-CM

## 2023-07-19 DIAGNOSIS — M1711 Unilateral primary osteoarthritis, right knee: Secondary | ICD-10-CM | POA: Insufficient documentation

## 2023-07-19 DIAGNOSIS — Z87891 Personal history of nicotine dependence: Secondary | ICD-10-CM

## 2023-07-19 MED ORDER — MUPIROCIN 2 % EX OINT: 1.0000 | TOPICAL_OINTMENT | Freq: Every day | CUTANEOUS | 0 refills | Status: DC

## 2023-07-19 MED ORDER — CLINDAMYCIN HCL 300 MG PO CAPS: 300.0000 mg | ORAL_CAPSULE | Freq: Four times a day (QID) | ORAL | 0 refills | Status: AC
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
## 2023-07-21 ENCOUNTER — Telehealth: Payer: Self-pay | Admitting: Family Medicine

## 2023-07-21 NOTE — Telephone Encounter (Signed)
Copied from CRM [id number]. Topic: General - Other >> Jul 21, 2023  1:22 PM [place] A wrote: Reason for CRM: Dorothy with Imaging called regarding an Order for patient but needs information regarding Prior Authorization   K. Robb Matar 07/21/2023 1:35p Sherri - No phone # provided for Nicole Cella by [employer] agent. I was able to look up phone # for her department [phone] [facility]).

## 2023-07-26 ENCOUNTER — Telehealth: Payer: Self-pay | Admitting: Cardiology

## 2023-07-26 NOTE — Telephone Encounter (Signed)
*  STAT* If patient is at the pharmacy, call can be transferred to refill team.   1. Which medications need to be refilled? (please list name of each medication and dose if known) Entresto 24  2. Which pharmacy/location (including street and city if local pharmacy) is medication to be sent to? [id number] - [place], [place] - [address] [phone]   3. Do they need a 30 day or 90 day supply? 90, please see if pt is supposed to be taking this and call pt. Not on current medlist but pt is taking

## 2023-07-26 NOTE — Telephone Encounter (Signed)
 Attempted to call patient, no answer left message requesting a call back.

## 2023-07-27 MED ORDER — SACUBITRIL-VALSARTAN 24-26 MG PO TABS: 1.0000 | ORAL_TABLET | Freq: Two times a day (BID) | ORAL | 3 refills | Status: AC

## 2023-07-27 NOTE — Telephone Encounter (Signed)
Rx refilled, patient notified 

## 2023-08-28 ENCOUNTER — Ambulatory Visit (HOSPITAL_BASED_OUTPATIENT_CLINIC_OR_DEPARTMENT_OTHER): Payer: 59 | Admitting: Cardiology

## 2023-08-31 NOTE — Progress Notes (Unsigned)
 Patient ID: [name], male   DOB: [birth year], 73 y.o.   MRN: [id number]  Reason for Consult: No chief complaint on file.   Referred by Garnette Gunner, MD  Subjective:     HPI  [name] is a 73 y.o. male ***  Past Medical History:  Diagnosis Date   Aneurysm of left ventricle of heart 04/12/2017   Angina pectoris (HCC) 11/24/2022   Anoxic brain injury (HCC)    CAD in native artery    CHF (congestive heart failure), NYHA class II, acute, combined (HCC) 04/11/2017   Chronic neck pain    Chronic pain of both knees 11/24/2022   Chronic systolic (congestive) heart failure (HCC)    Class 2 severe obesity with serious comorbidity and body mass index (BMI) of 39.0 to 39.9 in adult (HCC) 11/24/2022   Confusion    Congestive heart failure (CHF) (HCC)    Coronary artery disease    Degeneration of lumbar intervertebral disc 11/02/2017   Disseminated idiopathic skeletal hyperostosis 11/02/2017   Dog bite of forearm 09/20/2017   Dysphagia    Elevated serum creatinine 10/06/2017   Encounter for management of intra-aortic balloon pump    Encounter for screening colonoscopy    Essential hypertension    History of tobacco use 02/18/2021   Hyperlipidemia 11/24/2022   Hypertension    Iron deficiency anemia 10/06/2017   Muscle cramps 02/18/2021   Penile discharge 09/26/2018   Physical debility 04/27/2017   Post-COVID syndrome 02/06/2023   STEMI (ST elevation myocardial infarction) (HCC) 04/11/2017   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 [facility])    Well adult exam 03/06/2019   Family History  Problem Relation Age of Onset   Esophageal cancer Mother    Past Surgical History:  Procedure Laterality Date   APPLICATION OF WOUND VAC N/A 04/14/2017   Procedure: APPLICATION OF WOUND VAC with reposition of Impella device;  Surgeon: Delight Ovens, MD;  Location: [facility] OR;  Service: Thoracic;  Laterality: N/A;   CARDIAC CATHETERIZATION     CORONARY STENT  INTERVENTION N/A 04/11/2017   Procedure: CORONARY STENT INTERVENTION;  Surgeon: Corky Crafts, MD;  Location: [facility] INVASIVE CV LAB;  Service: Cardiovascular;  Laterality: N/A;   CORONARY THROMBECTOMY N/A 04/11/2017   Procedure: Coronary Thrombectomy;  Surgeon: Corky Crafts, MD;  Location: [facility] INVASIVE CV LAB;  Service: Cardiovascular;  Laterality: N/A;   CORONARY/GRAFT ACUTE MI REVASCULARIZATION N/A 04/11/2017   Procedure: Coronary/Graft Acute MI Revascularization;  Surgeon: Corky Crafts, MD;  Location: [facility] INVASIVE CV LAB;  Service: Cardiovascular;  Laterality: N/A;   IABP INSERTION N/A 04/11/2017   Procedure: IABP Insertion;  Surgeon: Corky Crafts, MD;  Location: [facility] INVASIVE CV LAB;  Service: Cardiovascular;  Laterality: N/A;   LEFT HEART CATH AND CORONARY ANGIOGRAPHY N/A 04/11/2017   Procedure: LEFT HEART CATH AND CORONARY ANGIOGRAPHY;  Surgeon: Corky Crafts, MD;  Location: [facility] INVASIVE CV LAB;  Service: Cardiovascular;  Laterality: N/A;   PLACEMENT OF IMPELLA LEFT VENTRICULAR ASSIST DEVICE  04/12/2017   Procedure: PLACEMENT OF IMPELLA  LD;  Surgeon: Delight Ovens, MD;  Location: [facility] OR;  Service: Open Heart Surgery;;   REMOVAL OF IMPELLA LEFT VENTRICULAR ASSIST DEVICE N/A 04/17/2017   Procedure: REMOVAL OF IMPELLA LEFT VENTRICULAR ASSIST DEVICE;  Surgeon: Delight Ovens, MD;  Location: [facility] OR;  Service: Open Heart Surgery;  Laterality: N/A;   REPAIR OF RIGHT VENTRICLE LACERATION Left 04/12/2017   Procedure: Resection of LV Aneurysm;  Surgeon: Delight Ovens, MD;  Location: [facility] OR;  Service: Open Heart Surgery;  Laterality: Left;   STERNAL CLOSURE N/A 04/14/2017   Procedure: STERNAL CLOSURE;  Surgeon: Delight Ovens, MD;  Location: [facility] OR;  Service: Thoracic;  Laterality: N/A;   TEE WITHOUT CARDIOVERSION N/A 04/14/2017   Procedure: TRANSESOPHAGEAL ECHOCARDIOGRAM (TEE);  Surgeon: Delight Ovens, MD;  Location: [facility] OR;  Service: Thoracic;   Laterality: N/A;   ULTRASOUND GUIDANCE FOR VASCULAR ACCESS  04/11/2017   Procedure: Ultrasound Guidance For Vascular Access;  Surgeon: Corky Crafts, MD;  Location: [facility] INVASIVE CV LAB;  Service: Cardiovascular;;    Short Social History:  Social History   Tobacco Use   Smoking status: Former    Current packs/day: 0.00    Average packs/day: 1.5 packs/day for 53.0 years (79.5 ttl pk-yrs)    Types: Cigarettes    Start date: 04/11/1964    Quit date: 04/11/2017    Years since quitting: 6.3    Passive exposure: Never   Smokeless tobacco: Never  Substance Use Topics   Alcohol use: Not Currently    Allergies  Allergen Reactions   Other Other (See Comments)    Potatoes- Blurry eyes, eyes hurt, and patient passes out (thinks he may bed sensitive)   Tomato Other (See Comments)    Potatoes- Blurry eyes, eyes hurt, and patient passes out (thinks he may bed sensitive)    Current Outpatient Medications  Medication Sig Dispense Refill   aspirin 81 MG chewable tablet Chew 81 mg by mouth daily.     carvedilol (COREG) 3.125 MG tablet Take 1 tablet (3.125 mg total) by mouth 2 (two) times daily with a meal. 180 tablet 3   Cholecalciferol (VITAMIN D3) 125 MCG (5000 UT) CAPS Take by mouth.     clopidogrel (PLAVIX) 75 MG tablet Take 1 tablet (75 mg total) by mouth daily. 90 tablet 3   cyclobenzaprine (FLEXERIL) 5 MG tablet TAKE 1 TABLET BY MOUTH 2 TIMES DAILY AS NEEDED FOR MUSCLE SPASMS. 60 tablet 0   diclofenac Sodium (VOLTAREN) 1 % GEL Apply 4 g topically 4 (four) times daily as needed. 100 g 3   famotidine (PEPCID) 20 MG tablet Take 1 tablet (20 mg total) by mouth at bedtime. 30 tablet 0   fluticasone (FLONASE) 50 MCG/ACT nasal spray Place 2 sprays into both nostrils daily. 16 g 6   Iron, Ferrous Sulfate, 325 (65 Fe) MG TABS Take 325 mg by mouth daily. 180 tablet 1   levocetirizine (XYZAL) 5 MG tablet TAKE 1 TABLET BY MOUTH EVERY DAY IN THE EVENING 90 tablet 1   Multiple Vitamins-Minerals  (MULTIVITAMIN WITH MINERALS) tablet Take 1 tablet by mouth daily.     mupirocin ointment (BACTROBAN) 2 % Apply 1 Application topically daily. 30 g 0   nitroGLYCERIN (NITROSTAT) 0.4 MG SL tablet Place 1 tablet (0.4 mg total) under the tongue every 5 (five) minutes as needed for chest pain. 50 tablet 3   rosuvastatin (CRESTOR) 20 MG tablet Take 1 tablet (20 mg total) by mouth daily. 90 tablet 3   sacubitril-valsartan (ENTRESTO) 24-26 MG Take 1 tablet by mouth 2 (two) times daily. 180 tablet 3   traMADol (ULTRAM) 50 MG tablet TAKE 1 TABLET BY MOUTH EVERY 8 HOURS AS NEEDED FOR UP TO 5 DAYS     No current facility-administered medications for this visit.    REVIEW OF SYSTEMS  Positive for ***  All other systems were reviewed and are negative  Objective:  Objective   There were no vitals filed for this visit. There is no height or weight on file to calculate BMI.  Physical Exam General: no acute distress Cardiac: hemodynamically stable Pulm: normal work of breathing Abdomen: non-tender, no pulsatile mass*** Neuro: alert, no focal deficit Extremities: no edema, cyanosis or wounds*** Vascular:   Right: ***  Left: ***   Data: ABI ***  CMP reviewed, Cr 1.18  Lipid panel reviewed     Assessment/Plan:     [name] is a 73 y.o. male ***    Recommendations to optimize cardiovascular risk: Abstinence from all tobacco products. Blood glucose control with goal A1c < 7%. Blood pressure control with goal blood pressure < 140/90 mmHg. Lipid reduction therapy with goal LDL-C <100 mg/dL  Aspirin 81mg  PO QD.  Atorvastatin 40-80mg  PO QD (or other "high intensity" statin therapy).     Daria Pastures MD [facility]

## 2023-09-01 ENCOUNTER — Ambulatory Visit (HOSPITAL_COMMUNITY)
Admission: RE | Admit: 2023-09-01 | Discharge: 2023-09-01 | Disposition: A | Discharge status: Home / Self Care | Payer: 59 | Source: Ambulatory Visit | Attending: Vascular Surgery | Admitting: Vascular Surgery

## 2023-09-01 ENCOUNTER — Ambulatory Visit: Payer: 59 | Admitting: Vascular Surgery

## 2023-09-01 ENCOUNTER — Encounter: Payer: Self-pay | Admitting: Vascular Surgery

## 2023-09-01 VITALS — BP 100/58 | HR 78 | Temp 98.6°F | Resp 18 | Ht 67.0 in | Wt 230.0 lb

## 2023-09-01 DIAGNOSIS — L97929 Non-pressure chronic ulcer of unspecified part of left lower leg with unspecified severity: Secondary | ICD-10-CM | POA: Insufficient documentation

## 2023-09-01 DIAGNOSIS — I739 Peripheral vascular disease, unspecified: Secondary | ICD-10-CM

## 2023-09-01 DIAGNOSIS — L97928 Non-pressure chronic ulcer of unspecified part of left lower leg with other specified severity: Secondary | ICD-10-CM | POA: Diagnosis not present

## 2023-09-05 ENCOUNTER — Encounter (HOSPITAL_BASED_OUTPATIENT_CLINIC_OR_DEPARTMENT_OTHER): Payer: 59 | Attending: Internal Medicine | Admitting: Internal Medicine

## 2023-09-05 DIAGNOSIS — W5503XA Scratched by cat, initial encounter: Secondary | ICD-10-CM

## 2023-09-05 DIAGNOSIS — I87312 Chronic venous hypertension (idiopathic) with ulcer of left lower extremity: Secondary | ICD-10-CM

## 2023-09-05 DIAGNOSIS — L97822 Non-pressure chronic ulcer of other part of left lower leg with fat layer exposed: Secondary | ICD-10-CM

## 2023-09-05 DIAGNOSIS — Z09 Encounter for follow-up examination after completed treatment for conditions other than malignant neoplasm: Secondary | ICD-10-CM | POA: Diagnosis not present

## 2023-09-12 ENCOUNTER — Encounter (HOSPITAL_BASED_OUTPATIENT_CLINIC_OR_DEPARTMENT_OTHER): Admitting: Internal Medicine

## 2023-09-12 DIAGNOSIS — Z09 Encounter for follow-up examination after completed treatment for conditions other than malignant neoplasm: Secondary | ICD-10-CM | POA: Diagnosis not present

## 2023-09-12 DIAGNOSIS — L97822 Non-pressure chronic ulcer of other part of left lower leg with fat layer exposed: Secondary | ICD-10-CM | POA: Diagnosis not present

## 2023-09-12 DIAGNOSIS — I87312 Chronic venous hypertension (idiopathic) with ulcer of left lower extremity: Secondary | ICD-10-CM | POA: Diagnosis not present

## 2023-09-19 ENCOUNTER — Emergency Department (HOSPITAL_BASED_OUTPATIENT_CLINIC_OR_DEPARTMENT_OTHER): Admitting: Radiology

## 2023-09-19 ENCOUNTER — Ambulatory Visit: Payer: Self-pay

## 2023-09-19 ENCOUNTER — Telehealth: Payer: Self-pay | Admitting: Family Medicine

## 2023-09-19 ENCOUNTER — Encounter (HOSPITAL_BASED_OUTPATIENT_CLINIC_OR_DEPARTMENT_OTHER): Payer: Self-pay | Admitting: Emergency Medicine

## 2023-09-19 ENCOUNTER — Emergency Department (HOSPITAL_BASED_OUTPATIENT_CLINIC_OR_DEPARTMENT_OTHER)
Admission: EM | Admit: 2023-09-19 | Discharge: 2023-09-19 | Disposition: A | Discharge status: Home / Self Care | Attending: Emergency Medicine | Admitting: Emergency Medicine

## 2023-09-19 ENCOUNTER — Telehealth: Payer: Self-pay | Admitting: Cardiology

## 2023-09-19 ENCOUNTER — Other Ambulatory Visit: Payer: Self-pay

## 2023-09-19 DIAGNOSIS — Z87891 Personal history of nicotine dependence: Secondary | ICD-10-CM | POA: Insufficient documentation

## 2023-09-19 DIAGNOSIS — I2510 Atherosclerotic heart disease of native coronary artery without angina pectoris: Secondary | ICD-10-CM | POA: Diagnosis not present

## 2023-09-19 DIAGNOSIS — I5041 Acute combined systolic (congestive) and diastolic (congestive) heart failure: Secondary | ICD-10-CM | POA: Insufficient documentation

## 2023-09-19 DIAGNOSIS — R031 Nonspecific low blood-pressure reading: Secondary | ICD-10-CM | POA: Diagnosis not present

## 2023-09-19 DIAGNOSIS — I110 Hypertensive heart disease with heart failure: Secondary | ICD-10-CM | POA: Insufficient documentation

## 2023-09-19 DIAGNOSIS — J811 Chronic pulmonary edema: Secondary | ICD-10-CM | POA: Diagnosis not present

## 2023-09-19 DIAGNOSIS — Z7982 Long term (current) use of aspirin: Secondary | ICD-10-CM | POA: Insufficient documentation

## 2023-09-19 DIAGNOSIS — R0789 Other chest pain: Secondary | ICD-10-CM | POA: Diagnosis not present

## 2023-09-19 DIAGNOSIS — H81399 Other peripheral vertigo, unspecified ear: Secondary | ICD-10-CM | POA: Diagnosis not present

## 2023-09-19 DIAGNOSIS — Z79899 Other long term (current) drug therapy: Secondary | ICD-10-CM | POA: Insufficient documentation

## 2023-09-19 DIAGNOSIS — R079 Chest pain, unspecified: Secondary | ICD-10-CM | POA: Diagnosis not present

## 2023-09-19 DIAGNOSIS — R42 Dizziness and giddiness: Secondary | ICD-10-CM | POA: Diagnosis not present

## 2023-09-19 DIAGNOSIS — R0989 Other specified symptoms and signs involving the circulatory and respiratory systems: Secondary | ICD-10-CM | POA: Diagnosis not present

## 2023-09-19 DIAGNOSIS — I517 Cardiomegaly: Secondary | ICD-10-CM | POA: Diagnosis not present

## 2023-09-19 LAB — CBC
HCT: 41.9 % (ref 39.0–52.0)
Hemoglobin: 13.3 g/dL (ref 13.0–17.0)
MCH: 28.1 pg (ref 26.0–34.0)
MCHC: 31.7 g/dL (ref 30.0–36.0)
MCV: 88.6 fL (ref 80.0–100.0)
Platelets: 214 10*3/uL (ref 150–400)
RBC: 4.73 MIL/uL (ref 4.22–5.81)
RDW: 15 % (ref 11.5–15.5)
WBC: 8.9 10*3/uL (ref 4.0–10.5)
nRBC: 0 % (ref 0.0–0.2)

## 2023-09-19 LAB — BASIC METABOLIC PANEL WITH GFR
Anion gap: 12 (ref 5–15)
BUN: 15 mg/dL (ref 8–23)
CO2: 24 mmol/L (ref 22–32)
Calcium: 9.9 mg/dL (ref 8.9–10.3)
Chloride: 102 mmol/L (ref 98–111)
Creatinine, Ser: 1.21 mg/dL (ref 0.61–1.24)
GFR, Estimated: >60 mL/min (ref >60)
Glucose, Bld: 99 mg/dL (ref 70–99)
Potassium: 4.4 mmol/L (ref 3.5–5.1)
Sodium: 138 mmol/L (ref 135–145)

## 2023-09-19 LAB — TROPONIN T, HIGH SENSITIVITY
Troponin T High Sensitivity: <15 ng/L (ref <19)
Troponin T High Sensitivity: <15 ng/L (ref <19)

## 2023-09-19 LAB — PRO BRAIN NATRIURETIC PEPTIDE: Pro Brain Natriuretic Peptide: 248 pg/mL (ref <300.0)

## 2023-09-19 MED ORDER — SODIUM CHLORIDE 0.9 % IV BOLUS: 500.0000 mL | Freq: Once | INTRAVENOUS | Status: DC

## 2023-09-19 MED ORDER — MECLIZINE HCL 25 MG PO TABS
25.0000 mg | ORAL_TABLET | Freq: Once | ORAL | Status: AC
  Administered 2023-09-19: 25 mg via ORAL
  Filled 2023-09-19: qty 1

## 2023-09-19 MED ORDER — MECLIZINE HCL 25 MG PO TABS: 25.0000 mg | ORAL_TABLET | Freq: Three times a day (TID) | ORAL | 0 refills | Status: AC | PRN

## 2023-09-19 NOTE — Telephone Encounter (Signed)
 Noted. Thanks.

## 2023-09-19 NOTE — Telephone Encounter (Signed)
 Pt called [facility] and requested to speak with pcp. It was strongly suggested that he be NT. He continued to refuse. [facility] called and asked if this was the right thing  Dr Hildy Lowers has not seen him since 07/25/23. I also strongly suggested that he talk to NT or go straight to the ED.

## 2023-09-19 NOTE — Discharge Instructions (Addendum)
 While you were in the emergency room, your blood work done that was normal.  Your chest x-ray was also normal.  I think your symptoms are being caused by vertigo.  You can take a medicine called meclizine  this can sometimes help with the symptoms.  Please follow-up with your primary care doctor within 1 to 2 weeks.  Return to the emergency room if you develop any increased difficulty with your balance or pain in your chest.

## 2023-09-19 NOTE — Telephone Encounter (Signed)
 Patient called stating he was at Urgent Care for feeling 'woozy' and they did an EKG that was showing differences since his most recent EKG. Patient was referred to go to Emergency Department for further evaluation. Patient has cardiac history of open heart surgery and 5 stents. Upon call back, patient is currently walking in to [facility] Emergency Department for evaluation now. This RN advised patient to follow up with PCP after ED visit. Patient verbalized understanding.   Copied from CRM [id number]. Topic: General - Call Back - No Documentation >> Sep 19, 2023  1:37 PM Shereese L wrote: Reason for CRM: patient has 5 stints in his heart and wanted to receive a phone call from the doctor and stated that he will be going to the ER Reason for Disposition  Doctor (or NP/PA) call to PCP  Answer Assessment - Initial Assessment Questions 1. REASON FOR CALL or QUESTION: "What is your reason for calling today?" or "How can I best help you?" or "What question do you have that I can help answer?"     Please see nurse triage notes.  2. CALLER: Document the source of call. (e.g., laboratory, patient).     Patient  Protocols used: PCP Call - No Triage-A-AH

## 2023-09-19 NOTE — ED Provider Notes (Signed)
 [place] EMERGENCY DEPARTMENT AT [facility] Provider Note  CSN: [id number] Arrival date & time: 09/19/23 1403  Chief Complaint(s) Dizziness  HPI [name] is a 73 y.o. male who is here today for occasional unsteadiness on his feet, as well as some pain in his chest.  Patient a bit of a difficult historian, tells me that he has occasionally felt "woozy "over the last couple of days.  Says it seems to come and go.  Is also concerned that he might have dislodged a stent in his heart by trying to climb up and down from a new jeep that he has purchased.  He denies any fever or chills.  He is not currently feeling "woozy".   Past Medical History Past Medical History:  Diagnosis Date   Aneurysm of left ventricle of heart 04/12/2017   Angina pectoris (HCC) 11/24/2022   Anoxic brain injury (HCC)    CAD in native artery    CHF (congestive heart failure), NYHA class II, acute, combined (HCC) 04/11/2017   Chronic neck pain    Chronic pain of both knees 11/24/2022   Chronic systolic (congestive) heart failure (HCC)    Class 2 severe obesity with serious comorbidity and body mass index (BMI) of 39.0 to 39.9 in adult [facility]) 11/24/2022   Confusion    Congestive heart failure (CHF) (HCC)    Coronary artery disease    Degeneration of lumbar intervertebral disc 11/02/2017   Disseminated idiopathic skeletal hyperostosis 11/02/2017   Dog bite of forearm 09/20/2017   Dysphagia    Elevated serum creatinine 10/06/2017   Encounter for management of intra-aortic balloon pump    Encounter for screening colonoscopy    Essential hypertension    History of tobacco use 02/18/2021   Hyperlipidemia 11/24/2022   Hypertension    Iron  deficiency anemia 10/06/2017   Muscle cramps 02/18/2021   Penile discharge 09/26/2018   Physical debility 04/27/2017   Post-COVID syndrome 02/06/2023   STEMI (ST elevation myocardial infarction) (HCC) 04/11/2017   Ventricular 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 [facility])    Well adult exam 03/06/2019   Patient Active Problem List   Diagnosis Date Noted   Primary osteoarthritis of right knee 07/19/2023   Chronic ulcer of left lower extremity (HCC) 07/19/2023   Cellulitis of lower extremity from cat scratch 04/20/2023   Coronary artery disease    Encounter for management of intra-aortic balloon pump    Hypertension    Post-COVID syndrome 02/06/2023   Class 2 severe obesity with serious comorbidity and body mass index (BMI) of 39.0 to 39.9 in adult (HCC) 11/24/2022   Angina pectoris (HCC) 11/24/2022   Chronic pain of both knees 11/24/2022   Hyperlipidemia 11/24/2022   History of tobacco use 02/18/2021   Muscle cramps 02/18/2021   Well adult exam 03/06/2019   Penile discharge 09/26/2018   Degeneration of lumbar intervertebral disc 11/02/2017   Disseminated idiopathic skeletal hyperostosis 11/02/2017   Need for hepatitis C screening test 10/06/2017   Elevated serum creatinine 10/06/2017   Iron  deficiency anemia 10/06/2017   Dog bite of forearm 09/20/2017   Essential hypertension    Confusion    Anoxic brain injury (HCC)    Chronic systolic (congestive) heart failure (HCC)    CAD in native artery    Physical debility 04/27/2017   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 [facility])    Congestive heart failure (CHF) (HCC)    Dysphagia    Chronic neck pain  Aneurysm of left ventricle of heart 04/12/2017   Encounter for screening colonoscopy    STEMI (ST elevation myocardial infarction) (HCC) 04/11/2017   CHF (congestive heart failure), NYHA class II, acute, combined (HCC) 04/11/2017   Home Medication(s) Prior to Admission medications   Medication Sig Start Date End Date Taking? Authorizing Provider  meclizine  (ANTIVERT ) 25 MG tablet Take 1 tablet (25 mg total) by mouth 3 (three) times daily as needed for dizziness. 09/19/23  Yes Nathanael Baker, DO  aspirin  81 MG chewable tablet Chew 81 mg by  mouth daily.    [provider]  carvedilol  (COREG ) 3.125 MG tablet Take 1 tablet (3.125 mg total) by mouth 2 (two) times daily with a meal. 07/14/23   Hugh Madura, MD  Cholecalciferol (VITAMIN D3) 125 MCG (5000 UT) CAPS Take by mouth.    [provider]  clopidogrel  (PLAVIX ) 75 MG tablet Take 1 tablet (75 mg total) by mouth daily. 07/05/23   Hugh Madura, MD  cyclobenzaprine  (FLEXERIL ) 5 MG tablet TAKE 1 TABLET BY MOUTH 2 TIMES DAILY AS NEEDED FOR MUSCLE SPASMS. 08/12/20   Cirigliano, Kathrine Paris, DO  diclofenac  Sodium (VOLTAREN ) 1 % GEL Apply 4 g topically 4 (four) times daily as needed. 11/24/22   Catheryn Cluck, MD  famotidine  (PEPCID ) 20 MG tablet Take 1 tablet (20 mg total) by mouth at bedtime. 07/18/22   Raspet, Erin K, PA-C  fluticasone  (FLONASE ) 50 MCG/ACT nasal spray Place 2 sprays into both nostrils daily. 02/06/23   Catheryn Cluck, MD  Iron , Ferrous Sulfate , 325 (65 Fe) MG TABS Take 325 mg by mouth daily. 02/19/21   Tonna Frederic, MD  levocetirizine (XYZAL ) 5 MG tablet TAKE 1 TABLET BY MOUTH EVERY DAY IN THE EVENING 12/17/22   Webb, Padonda B, FNP  Multiple Vitamins-Minerals (MULTIVITAMIN WITH MINERALS) tablet Take 1 tablet by mouth daily.    [provider]  mupirocin  ointment (BACTROBAN ) 2 % Apply 1 Application topically daily. 07/19/23   Catheryn Cluck, MD  nitroGLYCERIN  (NITROSTAT ) 0.4 MG SL tablet Place 1 tablet (0.4 mg total) under the tongue every 5 (five) minutes as needed for chest pain. 11/24/22   Catheryn Cluck, MD  rosuvastatin  (CRESTOR ) 20 MG tablet Take 1 tablet (20 mg total) by mouth daily. 11/24/22   Catheryn Cluck, MD  sacubitril -valsartan  (ENTRESTO ) 24-26 MG Take 1 tablet by mouth 2 (two) times daily. 07/27/23   Hugh Madura, MD  traMADol  (ULTRAM ) 50 MG tablet TAKE 1 TABLET BY MOUTH EVERY 8 HOURS AS NEEDED FOR UP TO 5 DAYS    [provider]                                                                                                                                     Past Surgical History Past Surgical History:  Procedure Laterality Date   APPLICATION OF WOUND VAC N/A 04/14/2017  Procedure: APPLICATION OF WOUND VAC with reposition of Impella device;  Surgeon: Norita Beauvais, MD;  Location: [facility] OR;  Service: Thoracic;  Laterality: N/A;   CARDIAC CATHETERIZATION     CORONARY STENT INTERVENTION N/A 04/11/2017   Procedure: CORONARY STENT INTERVENTION;  Surgeon: Lucendia Rusk, MD;  Location: [facility] INVASIVE CV LAB;  Service: Cardiovascular;  Laterality: N/A;   CORONARY THROMBECTOMY N/A 04/11/2017   Procedure: Coronary Thrombectomy;  Surgeon: Lucendia Rusk, MD;  Location: [facility] INVASIVE CV LAB;  Service: Cardiovascular;  Laterality: N/A;   CORONARY/GRAFT ACUTE MI REVASCULARIZATION N/A 04/11/2017   Procedure: Coronary/Graft Acute MI Revascularization;  Surgeon: Lucendia Rusk, MD;  Location: [facility] INVASIVE CV LAB;  Service: Cardiovascular;  Laterality: N/A;   IABP INSERTION N/A 04/11/2017   Procedure: IABP Insertion;  Surgeon: Lucendia Rusk, MD;  Location: [facility] INVASIVE CV LAB;  Service: Cardiovascular;  Laterality: N/A;   LEFT HEART CATH AND CORONARY ANGIOGRAPHY N/A 04/11/2017   Procedure: LEFT HEART CATH AND CORONARY ANGIOGRAPHY;  Surgeon: Lucendia Rusk, MD;  Location: [facility] INVASIVE CV LAB;  Service: Cardiovascular;  Laterality: N/A;   PLACEMENT OF IMPELLA LEFT VENTRICULAR ASSIST DEVICE  04/12/2017   Procedure: PLACEMENT OF IMPELLA  LD;  Surgeon: Norita Beauvais, MD;  Location: [facility] OR;  Service: Open Heart Surgery;;   REMOVAL OF IMPELLA LEFT VENTRICULAR ASSIST DEVICE N/A 04/17/2017   Procedure: REMOVAL OF IMPELLA LEFT VENTRICULAR ASSIST DEVICE;  Surgeon: Norita Beauvais, MD;  Location: [facility] OR;  Service: Open Heart Surgery;  Laterality: N/A;   REPAIR OF RIGHT VENTRICLE LACERATION Left 04/12/2017   Procedure: Resection of LV Aneurysm;  Surgeon: Norita Beauvais, MD;  Location: [facility] OR;   Service: Open Heart Surgery;  Laterality: Left;   STERNAL CLOSURE N/A 04/14/2017   Procedure: STERNAL CLOSURE;  Surgeon: Norita Beauvais, MD;  Location: [facility] OR;  Service: Thoracic;  Laterality: N/A;   TEE WITHOUT CARDIOVERSION N/A 04/14/2017   Procedure: TRANSESOPHAGEAL ECHOCARDIOGRAM (TEE);  Surgeon: Norita Beauvais, MD;  Location: [facility] OR;  Service: Thoracic;  Laterality: N/A;   ULTRASOUND GUIDANCE FOR VASCULAR ACCESS  04/11/2017   Procedure: Ultrasound Guidance For Vascular Access;  Surgeon: Lucendia Rusk, MD;  Location: [facility] INVASIVE CV LAB;  Service: Cardiovascular;;   Family History Family History  Problem Relation Age of Onset   Esophageal cancer Mother     Social History Social History   Tobacco Use   Smoking status: Former    Current packs/day: 0.00    Average packs/day: 1.5 packs/day for 53.0 years (79.5 ttl pk-yrs)    Types: Cigarettes    Start date: 04/11/1964    Quit date: 04/11/2017    Years since quitting: 6.4    Passive exposure: Never   Smokeless tobacco: Never  Vaping Use   Vaping status: Never Used  Substance Use Topics   Alcohol use: Not Currently   Drug use: Never   Allergies Other and Tomato  Review of Systems Review of Systems  Physical Exam Vital Signs  I have reviewed the triage vital signs BP (!) 154/76   Pulse 80   Temp 97.6 F (36.4 C) (Oral)   Resp 18   SpO2 96%   Physical Exam Vitals reviewed.  HENT:     Right Ear: Tympanic membrane and ear canal normal.     Left Ear: Tympanic membrane and ear canal normal.  Eyes:     Pupils: Pupils are equal, round, and reactive to light.  Cardiovascular:  Rate and Rhythm: Normal rate.  Pulmonary:     Effort: Pulmonary effort is normal. No respiratory distress.     Breath sounds: No wheezing.  Abdominal:     General: Abdomen is flat.     Palpations: Abdomen is soft.  Skin:    General: Skin is warm.  Neurological:     General: No focal deficit present.     Mental Status: He  is alert.     Comments: Normal gait, negative Romberg.  5-5 strength in bilateral upper and lower extremities.  Intact sensation.  Cranial nerves II through XII intact.  No nystagmus.     ED Results and Treatments Labs (all labs ordered are listed, but only abnormal results are displayed) Labs Reviewed  BASIC METABOLIC PANEL WITH GFR  CBC  PRO BRAIN NATRIURETIC PEPTIDE  TROPONIN T, HIGH SENSITIVITY  TROPONIN T, HIGH SENSITIVITY                                                                                                                          Radiology DG Chest 2 View Result Date: 09/19/2023 CLINICAL DATA:  Chest pain EXAM: CHEST - 2 VIEW COMPARISON:  Chest x-ray 06/06/2022.  CT of the chest 12/08/2017. FINDINGS: Sternotomy wires are again seen. The heart is mildly enlarged. There central pulmonary vascular congestion and some interstitial opacities in the infrahilar regions. There is no lung infiltrate, pleural effusion or pneumothorax. No acute osseous abnormality. There are sclerotic densities in the right humeral neck which are unchanged, possibly bone infarcts. IMPRESSION: Mild cardiomegaly with mild interstitial edema. Electronically Signed   By: Tyron Gallon M.D.   On: 09/19/2023 15:17    Pertinent labs & imaging results that were available during my care of the patient were reviewed by me and considered in my medical decision making (see MDM for details).  Medications Ordered in ED Medications  meclizine  (ANTIVERT ) tablet 25 mg (25 mg Oral Given 09/19/23 1948)                                                                                                                                     Procedures Procedures  (including critical care time)  Medical Decision Making / ED Course   This patient presents to the ED for concern of intermittent episodes of feeling unsteady, concerns about previous cardiac intervention., this involves an extensive number of treatment  options,  and is a complaint that carries with it a high risk of complications and morbidity.  The differential diagnosis includes peripheral vertigo, central vertigo, ACS, pulmonary edema.  MDM: Patient a bit of a difficult historian, however after spending a bit more time with the patient, was able nail down that his primary concerns are occasionally feeling a little bit dizzy, which she describes as "feeling drunk."  Symptoms seem to come and go, and is not experiencing any symptoms such as that right now.  Symptoms seem to be positional, I am inclined to think that this is likely peripheral vertigo.  Considered advanced imaging on the patient such as CT and CTA, however patient is reluctant to have additional testing done at this time due to not wanting to have an IV placed, being in the ED for an extended period of time thus far.  With his overall benign exam at this time I think that this is reasonable, and he can follow-up with his PCP for additional testing.  Patient's chest x-ray does show some vascular congestion.  Perhaps patient is feeling a bit short of breath with ambulation.  When we get a repeat troponin, will get a BMP on the patient.  Reassessment 9 PM-patient's BNP not elevated, troponin flat.  Likely peripheral vertigo.  Will treat.  Patient ambulatory at time of discharge.   Additional history obtained: -Additional history obtained from  -External records from outside source obtained and reviewed including: Chart review including previous notes, labs, imaging, consultation notes   Lab Tests: -I ordered, reviewed, and interpreted labs.   The pertinent results include:   Labs Reviewed  BASIC METABOLIC PANEL WITH GFR  CBC  PRO BRAIN NATRIURETIC PEPTIDE  TROPONIN T, HIGH SENSITIVITY  TROPONIN T, HIGH SENSITIVITY      EKG ST depressions in the inferior and lateral precordial leads.  No changes from 06/19/2023  EKG Interpretation Date/Time:  Tuesday September 19 2023 14:11:23  EDT Ventricular Rate:  80 PR Interval:  152 QRS Duration:  82 QT Interval:  358 QTC Calculation: 412 R Axis:   121  Text Interpretation: Normal sinus rhythm Right axis deviation T wave abnormality, consider inferolateral ischemia Abnormal ECG When compared with ECG of 19-Jun-2023 11:27, No significant change was found No change from 06/19/23 Confirmed by Afton Horse [phone]) on 09/19/2023 7:43:16 PM         Imaging Studies ordered: I ordered imaging studies including  I independently visualized and interpreted imaging. I agree with the radiologist interpretation   Medicines ordered and prescription drug management: Meds ordered this encounter  Medications   meclizine  (ANTIVERT ) tablet 25 mg   DISCONTD: sodium chloride  0.9 % bolus 500 mL   meclizine  (ANTIVERT ) 25 MG tablet    Sig: Take 1 tablet (25 mg total) by mouth 3 (three) times daily as needed for dizziness.    Dispense:  30 tablet    Refill:  0    -I have reviewed the patients home medicines and have made adjustments as needed    Cardiac Monitoring: The patient was maintained on a cardiac monitor.  I personally viewed and interpreted the cardiac monitored which showed an underlying rhythm of: Normal sinus rhythm  Social Determinants of Health:  Factors impacting patients care include: Lack of access to primary care   Reevaluation: After the interventions noted above, I reevaluated the patient and found that they have :improved  Co morbidities that complicate the patient evaluation  Past Medical History:  Diagnosis Date   Aneurysm of  left ventricle of heart 04/12/2017   Angina pectoris (HCC) 11/24/2022   Anoxic brain injury (HCC)    CAD in native artery    CHF (congestive heart failure), NYHA class II, acute, combined (HCC) 04/11/2017   Chronic neck pain    Chronic pain of both knees 11/24/2022   Chronic systolic (congestive) heart failure (HCC)    Class 2 severe obesity with serious comorbidity and body  mass index (BMI) of 39.0 to 39.9 in adult [facility]) 11/24/2022   Confusion    Congestive heart failure (CHF) (HCC)    Coronary artery disease    Degeneration of lumbar intervertebral disc 11/02/2017   Disseminated idiopathic skeletal hyperostosis 11/02/2017   Dog bite of forearm 09/20/2017   Dysphagia    Elevated serum creatinine 10/06/2017   Encounter for management of intra-aortic balloon pump    Encounter for screening colonoscopy    Essential hypertension    History of tobacco use 02/18/2021   Hyperlipidemia 11/24/2022   Hypertension    Iron  deficiency anemia 10/06/2017   Muscle cramps 02/18/2021   Penile discharge 09/26/2018   Physical debility 04/27/2017   Post-COVID syndrome 02/06/2023   STEMI (ST elevation myocardial infarction) (HCC) 04/11/2017   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 [facility])    Well adult exam 03/06/2019      Dispostion: I considered admission for this patient, however the patient's symptoms he is appropriate for outpatient follow-up.     Final Clinical Impression(s) / ED Diagnoses Final diagnoses:  Peripheral vertigo, unspecified laterality     @PCDICTATION @    Afton Horse T, DO 09/19/23 2110

## 2023-09-19 NOTE — Telephone Encounter (Signed)
   Received page to call patient back in regards to his current state of health. He is currently in the [facility] ED to get worked up for dizziness that he has been having for the last couple of days. He called because he said that he was taken back, they drew labs, did a chest x-ray, and repeated an EKG. He was then placed back into the waiting room and had not been updated yet. He called to see what he should do. I instructed patient to continue to wait in the waiting room until someone pulls him back into the room, to discuss his results with him and develop a plan. Patient expressed understanding and stated he would stay in the waiting room until he is told about his results. All questions by patient were answered.   Jiles Mote, PA-C 09/19/2023 6:53 PM

## 2023-09-19 NOTE — Telephone Encounter (Signed)
 Pt spoke with NT. Per NT note " FYI - patient is at ER being evaluated. "

## 2023-09-19 NOTE — ED Triage Notes (Signed)
 Dizziness x 2 days  Some chest pain

## 2023-09-20 ENCOUNTER — Telehealth: Payer: Self-pay

## 2023-09-20 NOTE — Transitions of Care (Post Inpatient/ED Visit) (Signed)
 09/20/2023  Name: [name] MRN: [id number] DOB: 1950-11-24  Today's TOC FU Call Status: Today's TOC FU Call Status:: Successful TOC FU Call Completed TOC FU Call Complete Date: 09/20/23 Patient's Name and Date of Birth confirmed.  Transition Care Management Follow-up Telephone Call Date of Discharge: 09/19/23 Discharge Facility: [facility] ([facility]) Type of Discharge: Emergency Department Reason for ED Visit: Other: (Peripheral Vertigo) How have you been since you were released from the hospital?: Same Any questions or concerns?: No  Items Reviewed: Did you receive and understand the discharge instructions provided?: Yes Medications obtained,verified, and reconciled?: Yes (Medications Reviewed) Any new allergies since your discharge?: No Dietary orders reviewed?: NA Do you have support at home?: No  Medications Reviewed Today: Medications Reviewed Today     Reviewed by Cathye Coca, LPN (Licensed Practical Nurse) on 09/20/23 at 1058  Med List Status: <None>   Medication Order Taking? Sig Documenting Provider Last Dose Status Informant  aspirin  81 MG chewable tablet [id number] Yes Chew 81 mg by mouth daily. [provider] Taking Active   carvedilol  (COREG ) 3.125 MG tablet [id number] Yes Take 1 tablet (3.125 mg total) by mouth 2 (two) times daily with a meal. Hugh Madura, MD Taking Active   Cholecalciferol (VITAMIN D3) 125 MCG (5000 UT) CAPS [id number] Yes Take by mouth. [provider] Taking Active   clopidogrel  (PLAVIX ) 75 MG tablet [id number] Yes Take 1 tablet (75 mg total) by mouth daily. Hugh Madura, MD Taking Active   cyclobenzaprine  (FLEXERIL ) 5 MG tablet [id number] Yes TAKE 1 TABLET BY MOUTH 2 TIMES DAILY AS NEEDED FOR MUSCLE SPASMS. Alben Hue, DO Taking Active   diclofenac  Sodium (VOLTAREN ) 1 % GEL [id number] Yes Apply 4 g topically 4 (four) times daily as needed. Catheryn Cluck, MD Taking Active   famotidine  (PEPCID ) 20 MG  tablet [id number] Yes Take 1 tablet (20 mg total) by mouth at bedtime. Raspet, Erin K, PA-C Taking Active   fluticasone  (FLONASE ) 50 MCG/ACT nasal spray [id number] Yes Place 2 sprays into both nostrils daily. Catheryn Cluck, MD Taking Active   Iron , Ferrous Sulfate , 325 (65 Fe) MG TABS [id number] Yes Take 325 mg by mouth daily. Tonna Frederic, MD Taking Active   levocetirizine (XYZAL ) 5 MG tablet [id number] Yes TAKE 1 TABLET BY MOUTH EVERY DAY IN THE [place] Gammon, Padonda B, FNP Taking Active   meclizine  (ANTIVERT ) 25 MG tablet [id number] Yes Take 1 tablet (25 mg total) by mouth 3 (three) times daily as needed for dizziness. Nathanael Baker, DO Taking Active   Multiple Vitamins-Minerals (MULTIVITAMIN WITH MINERALS) tablet [id number] Yes Take 1 tablet by mouth daily. [provider] Taking Active   mupirocin  ointment (BACTROBAN ) 2 % [id number] Yes Apply 1 Application topically daily. Catheryn Cluck, MD Taking Active   nitroGLYCERIN  (NITROSTAT ) 0.4 MG SL tablet [id number] Yes Place 1 tablet (0.4 mg total) under the tongue every 5 (five) minutes as needed for chest pain. Catheryn Cluck, MD Taking Active   rosuvastatin  (CRESTOR ) 20 MG tablet [id number] Yes Take 1 tablet (20 mg total) by mouth daily. Catheryn Cluck, MD Taking Active   sacubitril -valsartan  (ENTRESTO ) 24-26 MG [id number] Yes Take 1 tablet by mouth 2 (two) times daily. Hugh Madura, MD Taking Active   traMADol  (ULTRAM ) 50 MG tablet [id number] Yes TAKE 1 TABLET BY MOUTH EVERY 8 HOURS AS NEEDED FOR UP TO 5 DAYS [provider] Taking Active   Med List Note Jacquiline Maul, [facility] 04/29/17 0203):  750mg  IV             Home Care and Equipment/Supplies: Were Home Health Services Ordered?: NA Any new equipment or medical supplies ordered?: NA  Functional Questionnaire: Do you need assistance with bathing/showering or dressing?: No Do you need assistance with meal preparation?: No Do you need assistance  with eating?: No Do you have difficulty maintaining continence: No Do you need assistance with getting out of bed/getting out of a chair/moving?: No Do you have difficulty managing or taking your medications?: No  Follow up appointments reviewed: PCP Follow-up appointment confirmed?: Yes Date of PCP follow-up appointment?: 09/25/23 Follow-up Provider: Dr. Hildy Lowers Specialist [facility] Follow-up appointment confirmed?: NA Do you need transportation to your follow-up appointment?: No Do you understand care options if your condition(s) worsen?: Yes-patient verbalized understanding    SIGNATURE [place]ron [place], LPN [facility] Health Advisor [place] [facility] You Are. We Are. [facility] [place] Direct Dial [phone]

## 2023-09-25 ENCOUNTER — Inpatient Hospital Stay: Admitting: Family Medicine

## 2023-10-13 ENCOUNTER — Other Ambulatory Visit: Payer: Self-pay | Admitting: Family Medicine

## 2023-10-13 DIAGNOSIS — L97929 Non-pressure chronic ulcer of unspecified part of left lower leg with unspecified severity: Secondary | ICD-10-CM

## 2024-02-20 ENCOUNTER — Ambulatory Visit: Payer: Self-pay

## 2024-02-20 ENCOUNTER — Ambulatory Visit: Admitting: Family Medicine

## 2024-02-20 NOTE — Telephone Encounter (Signed)
 FYI Only or Action Required?: Action required by provider: update on patient condition.  Patient was last seen in primary care on 07/19/2023 by Sebastian Beverley NOVAK, MD.  Called Nurse Triage reporting Hematuria.  Symptoms began several days ago.  Interventions attempted: Nothing.  Symptoms are: unchanged.  Triage Disposition: See Physician Within 24 Hours  Patient/caregiver understands and will follow disposition?: Yes  Copied from CRM [id number]. Topic: Clinical - Red Word Triage >> Feb 20, 2024 11:02 AM Carlyon D wrote: Red Word that prompted transfer to Nurse Triage: Pt is urinating blood pt states its a lot. 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  Unable to come in sooner for appointment.  Will call or proceed to ED if symptoms worsen  1. COLOR of URINE: Describe the color of the urine.  (e.g., tea-colored, pink, red, bloody) Do you have blood clots in your urine? (e.g., none, pea, grape, small coin)     One episode of bloody urine 2. ONSET: When did the bleeding start?      Four days ago 3. EPISODES: How many times has there been blood in the urine? or How many times today?     One time 4. PAIN with URINATION: Is there any pain with passing your urine? If Yes, ask: How bad is the pain?  (Scale 1-10; or mild, moderate, severe)     Denies pain 5. FEVER: Do you have a fever? If Yes, ask: What is your temperature, how was it measured, and when did it start?     denies 6. ASSOCIATED SYMPTOMS: Are you passing urine more frequently than usual?     denies 7. OTHER SYMPTOMS: Do you have any other symptoms? (e.g., back/flank pain, abdomen pain, vomiting)     Admits to some blood in his stool x1 as well 8. PREGNANCY: Is there any chance you are pregnant? When was your last menstrual period?     N/a  Protocols used:  Urine - Blood In-A-AH

## 2024-02-22 ENCOUNTER — Ambulatory Visit (INDEPENDENT_AMBULATORY_CARE_PROVIDER_SITE_OTHER): Admitting: Internal Medicine

## 2024-02-22 VITALS — BP 132/80 | HR 68 | Temp 97.8°F | Ht 67.0 in | Wt 238.0 lb

## 2024-02-22 DIAGNOSIS — R319 Hematuria, unspecified: Secondary | ICD-10-CM

## 2024-02-22 DIAGNOSIS — I5041 Acute combined systolic (congestive) and diastolic (congestive) heart failure: Secondary | ICD-10-CM | POA: Diagnosis not present

## 2024-02-22 DIAGNOSIS — K921 Melena: Secondary | ICD-10-CM | POA: Diagnosis not present

## 2024-02-22 LAB — POC URINALSYSI DIPSTICK (AUTOMATED)
Bilirubin, UA: NEGATIVE
Blood, UA: NEGATIVE
Glucose, UA: NEGATIVE
Ketones, UA: NEGATIVE
Leukocytes, UA: NEGATIVE
Nitrite, UA: NEGATIVE
Protein, UA: NEGATIVE
Spec Grav, UA: 1.02 (ref 1.010–1.025)
Urobilinogen, UA: 0.2 U/dL
pH, UA: 6 (ref 5.0–8.0)

## 2024-02-22 MED ORDER — CLOPIDOGREL BISULFATE 75 MG PO TABS: 75.0000 mg | ORAL_TABLET | Freq: Every day | ORAL | 0 refills | Status: AC

## 2024-02-22 MED ORDER — ROSUVASTATIN CALCIUM 20 MG PO TABS
20.0000 mg | ORAL_TABLET | Freq: Every day | ORAL | 0 refills | Status: DC
Start: 2024-02-22 — End: 2024-03-28

## 2024-02-22 NOTE — Progress Notes (Signed)
 [facility] PRIMARY [facility]ARE [facility] [address] [place] [address] Dept: [phone] Dept Fax: [phone]  Acute Care Office Visit  Subjective:   [name] 03-09-1951 02/22/2024  Chief Complaint  Patient presents with   Urinary Tract Infection    Blood in urine 2 weeks ago some rectal bleeding a week ago     HPI:  [name] is a 73 yo M whith PMHx of MI, CHF, HTN, HLD, anoxic brain injury, who complains of 1 episode of blood in his urine 2 weeks ago.  Patient states he had drank 2 to 3 cups of coffee, went to the bathroom to void, and at the end of voiding he noticed some blood in his urine.  Since that episode, he has had no further episodes of blood in his urine.  He denies fever, chills, abdominal pain, dysuria, increased urination frequency, penile discharge.  He is not sexually active.  He is supposed to be taking clopidogrel  75 mg p.o. daily, but he states he has misplaced his medication.  He also reports unable to find his rosuvastatin .  He thinks a family member accidentally threw it away or misplaced it.  He has been out of these medications and has not taken them for at least 3 weeks.  He was not taking his Plavix  at the time of the episode of hematuria. He does have the rest of his medications.  He also reports having 1 episode of blood in his stool after straining for a bowel movement.  Patient reports he was constipated at that time.  He started taking Colace which has helped soften his stools.  No further episodes of blood in his stool.  He denies abdominal pain, diarrhea.     The following portions of the patient's history were reviewed and updated as appropriate: past medical history, past surgical history, family history, social history, allergies, medications, and problem list.   Patient Active Problem List   Diagnosis Date Noted   Primary osteoarthritis of right knee 07/19/2023   Chronic ulcer of left lower extremity  (HCC) 07/19/2023   Cellulitis of lower extremity from cat scratch 04/20/2023   Coronary artery disease    Encounter for management of intra-aortic balloon pump    Hypertension    Post-COVID syndrome 02/06/2023   Class 2 severe obesity with serious comorbidity and body mass index (BMI) of 39.0 to 39.9 in adult 11/24/2022   Angina pectoris 11/24/2022   Chronic pain of both knees 11/24/2022   Hyperlipidemia 11/24/2022   History of tobacco use 02/18/2021   Muscle cramps 02/18/2021   Well adult exam 03/06/2019   Penile discharge 09/26/2018   Degeneration of lumbar intervertebral disc 11/02/2017   Disseminated idiopathic skeletal hyperostosis 11/02/2017   Need for hepatitis C screening test 10/06/2017   Elevated serum creatinine 10/06/2017   Iron  deficiency anemia 10/06/2017   Dog bite of forearm 09/20/2017   Essential hypertension    Confusion    Anoxic brain injury (HCC)    Chronic systolic (congestive) heart failure (HCC)    CAD in native artery    Physical debility 04/27/2017   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 [facility])    Congestive heart failure (CHF) (HCC)    Dysphagia    Chronic neck pain    Aneurysm of left ventricle of heart 04/12/2017   Encounter for screening colonoscopy    STEMI (ST elevation myocardial infarction) (HCC) 04/11/2017   CHF (congestive heart failure), NYHA class II, acute, combined (  HCC) 04/11/2017   Past Medical History:  Diagnosis Date   Aneurysm of left ventricle of heart 04/12/2017   Angina pectoris 11/24/2022   Anoxic brain injury (HCC)    CAD in native artery    CHF (congestive heart failure), NYHA class II, acute, combined (HCC) 04/11/2017   Chronic neck pain    Chronic pain of both knees 11/24/2022   Chronic systolic (congestive) heart failure (HCC)    Class 2 severe obesity with serious comorbidity and body mass index (BMI) of 39.0 to 39.9 in adult 11/24/2022   Confusion    Congestive heart failure (CHF) (HCC)     Coronary artery disease    Degeneration of lumbar intervertebral disc 11/02/2017   Disseminated idiopathic skeletal hyperostosis 11/02/2017   Dog bite of forearm 09/20/2017   Dysphagia    Elevated serum creatinine 10/06/2017   Encounter for management of intra-aortic balloon pump    Encounter for screening colonoscopy    Essential hypertension    History of tobacco use 02/18/2021   Hyperlipidemia 11/24/2022   Hypertension    Iron  deficiency anemia 10/06/2017   Muscle cramps 02/18/2021   Penile discharge 09/26/2018   Physical debility 04/27/2017   Post-COVID syndrome 02/06/2023   STEMI (ST elevation myocardial infarction) (HCC) 04/11/2017   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 [facility])    Well adult exam 03/06/2019   Past Surgical History:  Procedure Laterality Date   APPLICATION OF WOUND VAC N/A 04/14/2017   Procedure: APPLICATION OF WOUND VAC with reposition of Impella device;  Surgeon: Army Dallas NOVAK, MD;  Location: [facility] OR;  Service: Thoracic;  Laterality: N/A;   CARDIAC CATHETERIZATION     CORONARY STENT INTERVENTION N/A 04/11/2017   Procedure: CORONARY STENT INTERVENTION;  Surgeon: Dann Candyce RAMAN, MD;  Location: [facility] INVASIVE CV LAB;  Service: Cardiovascular;  Laterality: N/A;   CORONARY THROMBECTOMY N/A 04/11/2017   Procedure: Coronary Thrombectomy;  Surgeon: Dann Candyce RAMAN, MD;  Location: [facility] INVASIVE CV LAB;  Service: Cardiovascular;  Laterality: N/A;   CORONARY/GRAFT ACUTE MI REVASCULARIZATION N/A 04/11/2017   Procedure: Coronary/Graft Acute MI Revascularization;  Surgeon: Dann Candyce RAMAN, MD;  Location: [facility] INVASIVE CV LAB;  Service: Cardiovascular;  Laterality: N/A;   IABP INSERTION N/A 04/11/2017   Procedure: IABP Insertion;  Surgeon: Dann Candyce RAMAN, MD;  Location: [facility] INVASIVE CV LAB;  Service: Cardiovascular;  Laterality: N/A;   LEFT HEART CATH AND CORONARY ANGIOGRAPHY N/A 04/11/2017   Procedure: LEFT HEART CATH AND  CORONARY ANGIOGRAPHY;  Surgeon: Dann Candyce RAMAN, MD;  Location: [facility] INVASIVE CV LAB;  Service: Cardiovascular;  Laterality: N/A;   PLACEMENT OF IMPELLA LEFT VENTRICULAR ASSIST DEVICE  04/12/2017   Procedure: PLACEMENT OF IMPELLA  LD;  Surgeon: Army Dallas NOVAK, MD;  Location: [facility] OR;  Service: Open Heart Surgery;;   REMOVAL OF IMPELLA LEFT VENTRICULAR ASSIST DEVICE N/A 04/17/2017   Procedure: REMOVAL OF IMPELLA LEFT VENTRICULAR ASSIST DEVICE;  Surgeon: Army Dallas NOVAK, MD;  Location: [facility] OR;  Service: Open Heart Surgery;  Laterality: N/A;   REPAIR OF RIGHT VENTRICLE LACERATION Left 04/12/2017   Procedure: Resection of LV Aneurysm;  Surgeon: Army Dallas NOVAK, MD;  Location: [facility] OR;  Service: Open Heart Surgery;  Laterality: Left;   STERNAL CLOSURE N/A 04/14/2017   Procedure: STERNAL CLOSURE;  Surgeon: Army Dallas NOVAK, MD;  Location: [facility] OR;  Service: Thoracic;  Laterality: N/A;   TEE WITHOUT CARDIOVERSION N/A 04/14/2017   Procedure: TRANSESOPHAGEAL ECHOCARDIOGRAM (TEE);  Surgeon: Army Dallas NOVAK, MD;  Location: [facility] OR;  Service: Thoracic;  Laterality: N/A;   ULTRASOUND GUIDANCE FOR VASCULAR ACCESS  04/11/2017   Procedure: Ultrasound Guidance For Vascular Access;  Surgeon: Dann Candyce RAMAN, MD;  Location: [facility] INVASIVE CV LAB;  Service: Cardiovascular;;   Family History  Problem Relation Age of Onset   Esophageal cancer Mother     Current Outpatient Medications:    aspirin  81 MG chewable tablet, Chew 81 mg by mouth daily., Disp: , Rfl:    carvedilol  (COREG ) 3.125 MG tablet, Take 1 tablet (3.125 mg total) by mouth 2 (two) times daily with a meal., Disp: 180 tablet, Rfl: 3   Cholecalciferol (VITAMIN D3) 125 MCG (5000 UT) CAPS, Take by mouth., Disp: , Rfl:    cyclobenzaprine  (FLEXERIL ) 5 MG tablet, TAKE 1 TABLET BY MOUTH 2 TIMES DAILY AS NEEDED FOR MUSCLE SPASMS., Disp: 60 tablet, Rfl: 0   Multiple Vitamins-Minerals (MULTIVITAMIN WITH MINERALS) tablet, Take 1 tablet by mouth daily.,  Disp: , Rfl:    nitroGLYCERIN  (NITROSTAT ) 0.4 MG SL tablet, Place 1 tablet (0.4 mg total) under the tongue every 5 (five) minutes as needed for chest pain., Disp: 50 tablet, Rfl: 3   clopidogrel  (PLAVIX ) 75 MG tablet, Take 1 tablet (75 mg total) by mouth daily., Disp: 90 tablet, Rfl: 0   diclofenac  Sodium (VOLTAREN ) 1 % GEL, Apply 4 g topically 4 (four) times daily as needed. (Patient not taking: Reported on 02/22/2024), Disp: 100 g, Rfl: 3   famotidine  (PEPCID ) 20 MG tablet, Take 1 tablet (20 mg total) by mouth at bedtime. (Patient not taking: Reported on 02/22/2024), Disp: 30 tablet, Rfl: 0   fluticasone  (FLONASE ) 50 MCG/ACT nasal spray, Place 2 sprays into both nostrils daily. (Patient not taking: Reported on 02/22/2024), Disp: 16 g, Rfl: 6   Iron , Ferrous Sulfate , 325 (65 Fe) MG TABS, Take 325 mg by mouth daily. (Patient not taking: Reported on 02/22/2024), Disp: 180 tablet, Rfl: 1   levocetirizine (XYZAL ) 5 MG tablet, TAKE 1 TABLET BY MOUTH EVERY DAY IN THE EVENING (Patient not taking: Reported on 02/22/2024), Disp: 90 tablet, Rfl: 1   meclizine  (ANTIVERT ) 25 MG tablet, Take 1 tablet (25 mg total) by mouth 3 (three) times daily as needed for dizziness. (Patient not taking: Reported on 02/22/2024), Disp: 30 tablet, Rfl: 0   mupirocin  ointment (BACTROBAN ) 2 %, APPLY 1 APPLICATION TOPICALLY DAILY (Patient not taking: Reported on 02/22/2024), Disp: 22 g, Rfl: 0   rosuvastatin  (CRESTOR ) 20 MG tablet, Take 1 tablet (20 mg total) by mouth daily., Disp: 90 tablet, Rfl: 0   sacubitril -valsartan  (ENTRESTO ) 24-26 MG, Take 1 tablet by mouth 2 (two) times daily., Disp: 180 tablet, Rfl: 3   traMADol  (ULTRAM ) 50 MG tablet, TAKE 1 TABLET BY MOUTH EVERY 8 HOURS AS NEEDED FOR UP TO 5 DAYS (Patient not taking: Reported on 02/22/2024), Disp: , Rfl:  Allergies  Allergen Reactions   Other Other (See Comments)    Potatoes- Blurry eyes, eyes hurt, and patient passes out (thinks he may bed sensitive)   Tomato Other (See  Comments)    Potatoes- Blurry eyes, eyes hurt, and patient passes out (thinks he may bed sensitive)     ROS: A complete ROS was performed with pertinent positives/negatives noted in the HPI. The remainder of the ROS are negative.    Objective:   Today's Vitals   02/22/24 1550  BP: 132/80  Pulse: 68  Temp: 97.8 F (36.6 C)  TempSrc: Temporal  SpO2: 98%  Weight: 238 lb (108 kg)  Height: 5' 7 (1.702 m)    GENERAL: Well-appearing, in NAD. Well nourished.  SKIN: Pink, warm and dry. No rash, lesion, ulceration, or ecchymoses.  NECK: Trachea midline. Full ROM w/o pain or tenderness. No lymphadenopathy.  RESPIRATORY: Chest wall symmetrical. Respirations even and non-labored. Breath sounds clear to auscultation bilaterally.  CARDIAC: S1, S2 present, regular rate and rhythm. Peripheral pulses 2+ bilaterally.  GI: Abdomen soft, non-tender. Normoactive bowel sounds. No CVA tenderness.  EXTREMITIES: Without clubbing, cyanosis, edema. PSYCH/MENTAL STATUS: Alert, oriented x 3. Cooperative, appropriate mood and affect.    Results for orders placed or performed in visit on 02/22/24  POCT Urinalysis Dipstick (Automated)  Result Value Ref Range   Color, UA     Clarity, UA     Glucose, UA Negative Negative   Bilirubin, UA Negative    Ketones, UA Negative    Spec Grav, UA 1.020 1.010 - 1.025   Blood, UA Negative    pH, UA 6.0 5.0 - 8.0   Protein, UA Negative Negative   Urobilinogen, UA 0.2 0.2 or 1.0 E.U./dL   Nitrite, UA Negative    Leukocytes, UA Negative Negative      Assessment & Plan:  1. Hematuria, unspecified type (Primary) - POCT Urinalysis Dipstick (Automated) - Urine analysis does not show any blood present in urine.  Advised patient if this reoccurs, to follow-up in the office for further evaluation. - He was off of his Plavix  when the blood in the urine started  2. Blood in stool -Resolved, likely due to straining with constipation  3. CHF (congestive heart failure),  NYHA class II, acute, combined (HCC) -I will resend in his rosuvastatin  since he has misplaced this medication. - rosuvastatin  (CRESTOR ) 20 MG tablet; Take 1 tablet (20 mg total) by mouth daily.  Dispense: 90 tablet; Refill: 0  I am also resending in his Plavix  since he has misplaced this medication as well.  Meds ordered this encounter  Medications   rosuvastatin  (CRESTOR ) 20 MG tablet    Sig: Take 1 tablet (20 mg total) by mouth daily.    Dispense:  90 tablet    Refill:  0    Supervising Provider:   THOMPSON, AARON B [[id number]]   clopidogrel  (PLAVIX ) 75 MG tablet    Sig: Take 1 tablet (75 mg total) by mouth daily.    Dispense:  90 tablet    Refill:  0    Supervising Provider:   THOMPSON, AARON B [[id number]]   Orders Placed This Encounter  Procedures   POCT Urinalysis Dipstick (Automated)   Lab Orders         POCT Urinalysis Dipstick (Automated)     No images are attached to the encounter or orders placed in the encounter.  Return in about 1 month (around 03/23/2024) for Chronic Condition follow up with Dr. Sebastian . lab work at visit. SABRA Rosina Senters, FNP

## 2024-03-05 ENCOUNTER — Telehealth: Payer: Self-pay | Admitting: Family Medicine

## 2024-03-05 ENCOUNTER — Encounter: Payer: Self-pay | Admitting: Family Medicine

## 2024-03-05 NOTE — Telephone Encounter (Signed)
 09/25/2023 & 02/20/2024 no show  Final warning sent via mail and mychart

## 2024-03-19 NOTE — Telephone Encounter (Signed)
 Copied from CRM #[id number]. Topic: General - Other >> Mar 19, 2024  9:23 AM Franky GRADE wrote: Reason for CRM: Patient is calling because he received a notice for 2 no shows; however, patient states the appointment he scheduled for 09/25/2023 was not an appointment he wanted for himself it was for his sister and the appointment on 02/20/2024 he states Dr.Thompson was not available to see him and he was seen on 02/22/2024 with Rosina Senters so he doesn't understand why it would be considered a no show.

## 2024-03-21 NOTE — Telephone Encounter (Signed)
 Pt is certain, confused, the appointment in April was for his sister, even though he was in the hospital. The appointment in [place] feels was scheduled on the 25th, not the 23rd. He feels we made a mistake in scheduling both appointments. My manager, Roxie is going to forgive one. He will be held accountable for one of the missed appt's. I will call pt and remind him of his upcoming appt.

## 2024-03-26 ENCOUNTER — Ambulatory Visit: Admitting: Family Medicine

## 2024-03-28 ENCOUNTER — Encounter: Payer: Self-pay | Admitting: Family Medicine

## 2024-03-28 ENCOUNTER — Ambulatory Visit: Admitting: Family Medicine

## 2024-03-28 VITALS — BP 126/68 | HR 81 | Temp 97.6°F | Resp 94 | Ht 67.0 in | Wt 239.0 lb

## 2024-03-28 DIAGNOSIS — I2510 Atherosclerotic heart disease of native coronary artery without angina pectoris: Secondary | ICD-10-CM

## 2024-03-28 DIAGNOSIS — Z87891 Personal history of nicotine dependence: Secondary | ICD-10-CM

## 2024-03-28 DIAGNOSIS — R7303 Prediabetes: Secondary | ICD-10-CM

## 2024-03-28 DIAGNOSIS — E782 Mixed hyperlipidemia: Secondary | ICD-10-CM | POA: Diagnosis not present

## 2024-03-28 DIAGNOSIS — I10 Essential (primary) hypertension: Secondary | ICD-10-CM | POA: Diagnosis not present

## 2024-03-28 DIAGNOSIS — I209 Angina pectoris, unspecified: Secondary | ICD-10-CM

## 2024-03-28 DIAGNOSIS — Z122 Encounter for screening for malignant neoplasm of respiratory organs: Secondary | ICD-10-CM

## 2024-03-28 DIAGNOSIS — I25119 Atherosclerotic heart disease of native coronary artery with unspecified angina pectoris: Secondary | ICD-10-CM | POA: Diagnosis not present

## 2024-03-28 DIAGNOSIS — Z6837 Body mass index (BMI) 37.0-37.9, adult: Secondary | ICD-10-CM

## 2024-03-28 DIAGNOSIS — B351 Tinea unguium: Secondary | ICD-10-CM

## 2024-03-28 DIAGNOSIS — D509 Iron deficiency anemia, unspecified: Secondary | ICD-10-CM

## 2024-03-28 DIAGNOSIS — I5041 Acute combined systolic (congestive) and diastolic (congestive) heart failure: Secondary | ICD-10-CM | POA: Diagnosis not present

## 2024-03-28 DIAGNOSIS — I5042 Chronic combined systolic (congestive) and diastolic (congestive) heart failure: Secondary | ICD-10-CM | POA: Diagnosis not present

## 2024-03-28 DIAGNOSIS — Z87448 Personal history of other diseases of urinary system: Secondary | ICD-10-CM

## 2024-03-28 DIAGNOSIS — E559 Vitamin D deficiency, unspecified: Secondary | ICD-10-CM

## 2024-03-28 DIAGNOSIS — Z136 Encounter for screening for cardiovascular disorders: Secondary | ICD-10-CM

## 2024-03-28 DIAGNOSIS — E66812 Obesity, class 2: Secondary | ICD-10-CM

## 2024-03-28 LAB — POC URINALSYSI DIPSTICK (AUTOMATED)
Blood, UA: NEGATIVE
Glucose, UA: NEGATIVE
Ketones, UA: NEGATIVE
Leukocytes, UA: NEGATIVE
Nitrite, UA: NEGATIVE
Protein, UA: POSITIVE — AB
Spec Grav, UA: 1.025 (ref 1.010–1.025)
Urobilinogen, UA: 0.2 U/dL
pH, UA: 6 (ref 5.0–8.0)

## 2024-03-28 MED ORDER — NITROGLYCERIN 0.4 MG SL SUBL
0.4000 mg | SUBLINGUAL_TABLET | SUBLINGUAL | 3 refills | Status: AC | PRN
Start: 2024-03-28 — End: ?

## 2024-03-28 MED ORDER — ROSUVASTATIN CALCIUM 20 MG PO TABS: 20.0000 mg | ORAL_TABLET | Freq: Every day | ORAL | 3 refills | Status: AC

## 2024-03-28 MED ORDER — VITAMIN D3 125 MCG (5000 UT) PO CAPS: 1.0000 | ORAL_CAPSULE | Freq: Every day | ORAL | 3 refills | Status: AC

## 2024-03-28 NOTE — Progress Notes (Signed)
 Assessment & Plan   Assessment/Plan:   Assessment and Plan Assessment & Plan Chronic heart failure with reduced ejection fraction (NYHA class II) NYHA class II, well-managed. No chest pain or dyspnea. Blood pressure controlled with current medications. - Continue carvedilol  3.125 mg BID - Continue sacubitril -valsartan  24/26 mg BID - Order TSH with Reflex T4 to screen for thyroid  dysfunction - Order ProBNP to assess heart function  Coronary artery disease with stable angina and history of myocardial infarction and stent placement Stable angina with intermittent chest pain managed with nitroglycerin . On dual antiplatelet therapy with aspirin  and clopidogrel . - Refill nitroglycerin  0.4 mg sublingual PRN - Continue aspirin  81 mg daily - Continue clopidogrel  75 mg daily - Order HS CRP  Chronic venous insufficiency with leg ulcers History of leg ulcers, currently healed but legs remain sore. - Order CK to assess for muscle breakdown   Hyperlipidemia Managed with rosuvastatin . Plan to further assess lipid profile and cardiovascular risk. - Continue rosuvastatin  20 mg daily - Order lipid panel - Order ApoB and ApoA - Order high sensitivity CRP  Morbid obesity (BMI 37) BMI of 37. Encouraged to maintain a heart-healthy diet and engage in cardiovascular exercise. - Encourage heart-healthy diet - Encourage cardiovascular exercise  Prediabetes Plan to assess current glycemic control and risk of diabetes. - Order hemoglobin A1c - Order C-peptide - Order insulin  level  History of smoking Due for AAA and lung cancer screening. - Order lung cancer screening - Order AAA screen   Vitamin D deficiency Plan to recheck vitamin D levels. - Order vitamin D level  Hematuria No gross hematuria currently. Plan to verify with urine studies. - Order urine microalbumin creatinine ratio - Order urine culture  Onychomycosis (nail fungus) Thick, yellow nails suggestive of onychomycosis.  Interested in seeing a podiatrist for management. - Refer to podiatry for management of onychomycosis        Assessment and Plan               Medications Discontinued During This Encounter  Medication Reason   Cholecalciferol (VITAMIN D3) 125 MCG (5000 UT) CAPS Reorder   rosuvastatin  (CRESTOR ) 20 MG tablet Reorder   nitroGLYCERIN  (NITROSTAT ) 0.4 MG SL tablet Reorder    Return in about 6 months (around 09/26/2024) for BP, HLD.        Subjective:   Encounter date: 03/28/2024  [name] is a 73 y.o. 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 [facility]); Chronic systolic (congestive) heart failure (HCC); CAD in native artery; Anoxic brain injury (HCC); Confusion; Essential hypertension; Need for hepatitis C screening test; Elevated serum creatinine; Iron  deficiency anemia; Penile discharge; Well adult exam; History of tobacco use; Muscle cramps; Class 2 severe obesity with serious comorbidity and body mass index (BMI) of 39.0 to 39.9 in adult; Angina pectoris; Chronic pain of both knees; Hyperlipidemia; Post-COVID syndrome; Coronary artery disease; Encounter for management of intra-aortic balloon pump; Hypertension; Degeneration of lumbar intervertebral disc; Disseminated idiopathic skeletal hyperostosis; Dog bite of forearm; Cellulitis of lower extremity from cat scratch; Primary osteoarthritis of right knee; and Chronic ulcer of left lower extremity (HCC) on their problem list..   He  has a past medical history of Aneurysm of left ventricle of heart (04/12/2017), Angina pectoris (11/24/2022), Anoxic brain injury (HCC),  CAD in native artery, CHF (congestive heart  failure), NYHA class II, acute, combined (HCC) (04/11/2017), Chronic neck pain, Chronic pain of both knees (11/24/2022), Chronic systolic (congestive) heart failure (HCC), Class 2 severe obesity with serious comorbidity and body mass index (BMI) of 39.0 to 39.9 in adult (11/24/2022), Confusion, Congestive heart failure (CHF) (HCC), Coronary artery disease, Degeneration of lumbar intervertebral disc (11/02/2017), Disseminated idiopathic skeletal hyperostosis (11/02/2017), Dog bite of forearm (09/20/2017), Dysphagia, Elevated serum creatinine (10/06/2017), Encounter for management of intra-aortic balloon pump, Encounter for screening colonoscopy, Essential hypertension, History of tobacco use (02/18/2021), Hyperlipidemia (11/24/2022), Hypertension, Iron  deficiency anemia (10/06/2017), Muscle cramps (02/18/2021), Penile discharge (09/26/2018), Physical debility (04/27/2017), Post-COVID syndrome (02/06/2023), STEMI (ST elevation myocardial infarction) (HCC) (04/11/2017), 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 He would like to get a refill on his nitroglycerin  tablets, as well as his flonase ./Pt also mentions that he is feeling better since his visit in September, but does still have some pink tinged urine on and off.) .  Discussed the use of AI scribe software for clinical note transcription with the patient, who gave verbal consent to proceed.  History of Present Illness [name] is a 73 year old male with hyperlipidemia, coronary artery disease, and heart failure who presents for chronic disease management follow-up.  Coronary Artery Disease / Angina  History of 2 MIs and 5 stents; managed with clopidogrel  75 mg daily, nitroglycerin  0.4 mg SL PRN (needs refill). Occasional chest pain  relieved by nitroglycerin .  Congestive Heart Failure  Managed with carvedilol  3.125 mg BID and sacubitril -valsartan  24/26 mg BID; no current decompensation. Followed by cardiology (last visit 06/19/2023).  Hyperlipidemia / Prediabetes  On rosuvastatin  20 mg daily; due for lipid panel and HbA1c.  Chronic Venous Insufficiency  History of bilateral leg ulcers (healed); residual soreness and dryness. Chronic ulcer left leg began Nov 2024 (cat scratch); treated with Bactroban  May 2025.  Onychomycosis  Thick, yellow nails; no podiatry evaluation yet.  Musculoskeletal Pain  Chronic neck, bilateral knee, and hip pain (previously linked to atorvastatin  use); mobility limited. Managed with Voltaren  gel; CK normal (48 on 11/24/2022).  Obesity  Weight stable 235-245 lbs; exercise limited by joint pain.  Other  History of smoking; due for lung cancer screening. Vitamin D deficiency.    ROS  Past Surgical History:  Procedure Laterality Date   APPLICATION OF WOUND VAC N/A 04/14/2017   Procedure: APPLICATION OF WOUND VAC with reposition of Impella device;  Surgeon: Army Dallas NOVAK, MD;  Location: [facility] OR;  Service: Thoracic;  Laterality: N/A;   CARDIAC CATHETERIZATION     CORONARY STENT INTERVENTION N/A 04/11/2017   Procedure: CORONARY STENT INTERVENTION;  Surgeon: Dann Candyce RAMAN, MD;  Location: [facility] INVASIVE CV LAB;  Service: Cardiovascular;  Laterality: N/A;   CORONARY THROMBECTOMY N/A 04/11/2017   Procedure: Coronary Thrombectomy;  Surgeon: Dann Candyce RAMAN, MD;  Location: [facility] INVASIVE CV LAB;  Service: Cardiovascular;  Laterality: N/A;   CORONARY/GRAFT ACUTE MI REVASCULARIZATION N/A 04/11/2017   Procedure: Coronary/Graft Acute MI Revascularization;  Surgeon: Dann Candyce RAMAN, MD;  Location: [facility] INVASIVE CV LAB;  Service: Cardiovascular;  Laterality: N/A;   IABP INSERTION N/A 04/11/2017   Procedure: IABP Insertion;  Surgeon: Dann Candyce RAMAN, MD;  Location: [facility]  INVASIVE CV LAB;  Service: Cardiovascular;  Laterality: N/A;   LEFT  HEART CATH AND CORONARY ANGIOGRAPHY N/A 04/11/2017   Procedure: LEFT HEART CATH AND CORONARY ANGIOGRAPHY;  Surgeon: Dann Candyce RAMAN, MD;  Location: [facility] INVASIVE CV LAB;  Service: Cardiovascular;  Laterality: N/A;   PLACEMENT OF IMPELLA LEFT VENTRICULAR ASSIST DEVICE  04/12/2017   Procedure: PLACEMENT OF IMPELLA  LD;  Surgeon: Army Dallas NOVAK, MD;  Location: [facility] OR;  Service: Open Heart Surgery;;   REMOVAL OF IMPELLA LEFT VENTRICULAR ASSIST DEVICE N/A 04/17/2017   Procedure: REMOVAL OF IMPELLA LEFT VENTRICULAR ASSIST DEVICE;  Surgeon: Army Dallas NOVAK, MD;  Location: [facility] OR;  Service: Open Heart Surgery;  Laterality: N/A;   REPAIR OF RIGHT VENTRICLE LACERATION Left 04/12/2017   Procedure: Resection of LV Aneurysm;  Surgeon: Army Dallas NOVAK, MD;  Location: [facility] OR;  Service: Open Heart Surgery;  Laterality: Left;   STERNAL CLOSURE N/A 04/14/2017   Procedure: STERNAL CLOSURE;  Surgeon: Army Dallas NOVAK, MD;  Location: [facility] OR;  Service: Thoracic;  Laterality: N/A;   TEE WITHOUT CARDIOVERSION N/A 04/14/2017   Procedure: TRANSESOPHAGEAL ECHOCARDIOGRAM (TEE);  Surgeon: Army Dallas NOVAK, MD;  Location: [facility] OR;  Service: Thoracic;  Laterality: N/A;   ULTRASOUND GUIDANCE FOR VASCULAR ACCESS  04/11/2017   Procedure: Ultrasound Guidance For Vascular Access;  Surgeon: Dann Candyce RAMAN, MD;  Location: [facility] INVASIVE CV LAB;  Service: Cardiovascular;;    Current Outpatient Medications on File Prior to Visit  Medication Sig Dispense Refill   aspirin  81 MG chewable tablet Chew 81 mg by mouth daily.     carvedilol  (COREG ) 3.125 MG tablet Take 1 tablet (3.125 mg total) by mouth 2 (two) times daily with a meal. 180 tablet 3   clopidogrel  (PLAVIX ) 75 MG tablet Take 1 tablet (75 mg total) by mouth daily. 90 tablet 0   cyclobenzaprine  (FLEXERIL ) 5 MG tablet TAKE 1 TABLET BY MOUTH 2 TIMES DAILY AS NEEDED FOR MUSCLE SPASMS. 60 tablet 0    Iron , Ferrous Sulfate , 325 (65 Fe) MG TABS Take 325 mg by mouth daily. 180 tablet 1   Multiple Vitamins-Minerals (MULTIVITAMIN WITH MINERALS) tablet Take 1 tablet by mouth daily.     sacubitril -valsartan  (ENTRESTO ) 24-26 MG Take 1 tablet by mouth 2 (two) times daily. 180 tablet 3   diclofenac  Sodium (VOLTAREN ) 1 % GEL Apply 4 g topically 4 (four) times daily as needed. (Patient not taking: Reported on 03/28/2024) 100 g 3   famotidine  (PEPCID ) 20 MG tablet Take 1 tablet (20 mg total) by mouth at bedtime. (Patient not taking: Reported on 03/28/2024) 30 tablet 0   fluticasone  (FLONASE ) 50 MCG/ACT nasal spray Place 2 sprays into both nostrils daily. (Patient not taking: Reported on 03/28/2024) 16 g 6   levocetirizine (XYZAL ) 5 MG tablet TAKE 1 TABLET BY MOUTH EVERY DAY IN THE EVENING (Patient not taking: Reported on 03/28/2024) 90 tablet 1   meclizine  (ANTIVERT ) 25 MG tablet Take 1 tablet (25 mg total) by mouth 3 (three) times daily as needed for dizziness. (Patient not taking: Reported on 03/28/2024) 30 tablet 0   mupirocin  ointment (BACTROBAN ) 2 % APPLY 1 APPLICATION TOPICALLY DAILY (Patient not taking: Reported on 03/28/2024) 22 g 0   traMADol  (ULTRAM ) 50 MG tablet TAKE 1 TABLET BY MOUTH EVERY 8 HOURS AS NEEDED FOR UP TO 5 DAYS (Patient not taking: Reported on 03/28/2024)     No current facility-administered medications on file prior to visit.   Family History  Problem Relation Age of Onset   Esophageal cancer Mother     Social History  Socioeconomic History   Marital status: Divorced    Spouse name: Not on file   Number of children: Not on file   Years of education: 14   Highest education level: Associate degree: occupational, scientist, product/process development, or vocational program  Occupational History   Occupation: Retired  Tobacco Use   Smoking status: Former    Current packs/day: 0.00    Average packs/day: 1.5 packs/day for 53.0 years (79.5 ttl pk-yrs)    Types: Cigarettes    Start date: 04/11/1964     Quit date: 04/11/2017    Years since quitting: 6.9    Passive exposure: Never   Smokeless tobacco: Never  Vaping Use   Vaping status: Never Used  Substance and Sexual Activity   Alcohol use: Not Currently   Drug use: Never   Sexual activity: Not Currently  Other Topics Concern   Not on file  Social History Narrative   Not on file   Social Drivers of Health   Financial Resource Strain: Low Risk  (01/31/2021)   Overall Financial Resource Strain (CARDIA)    Difficulty of Paying Living Expenses: Not hard at all  Food Insecurity: Unknown (01/31/2021)   Hunger Vital Sign    Worried About Running Out of Food in the Last Year: Patient declined    Ran Out of Food in the Last Year: Patient declined  Transportation Needs: No Transportation Needs (01/31/2021)   PRAPARE - Administrator, Civil Service (Medical): No    Lack of Transportation (Non-Medical): No  Physical Activity: Unknown (01/31/2021)   Exercise Vital Sign    Days of Exercise per Week: Patient declined    Minutes of Exercise per Session: Patient declined  Stress: No Stress Concern Present (01/31/2021)   [employer] of Occupational Health - Occupational Stress Questionnaire    Feeling of Stress : Not at all  Social Connections: Moderately Isolated (01/31/2021)   Social Connection and Isolation Panel    Frequency of Communication with Friends and Family: More than three times a week    Frequency of Social Gatherings with Friends and Family: More than three times a week    Attends Religious Services: More than 4 times per year    Active Member of [employer] or Organizations: No    Attends Banker Meetings: Never    Marital Status: Divorced  Catering Manager Violence: Not At Risk (01/31/2021)   Humiliation, Afraid, Rape, and Kick questionnaire    Fear of Current or Ex-Partner: No    Emotionally Abused: No    Physically Abused: No    Sexually Abused: No                                                                                                   Objective:  Physical Exam: BP 126/68 (BP Location: Left Arm, Cuff Size: Normal)   Pulse 81   Temp 97.6 F (36.4 C) (Temporal)   Resp (!) 94   Ht 5' 7 (1.702 m)   Wt 239 lb (108.4 kg)   BMI 37.43 kg/m    Physical Exam Constitutional:  Appearance: Normal appearance.  HENT:     Head: Normocephalic and atraumatic.     Right Ear: Hearing normal.     Left Ear: Hearing normal.     Nose: Nose normal.  Eyes:     General: No scleral icterus.       Right eye: No discharge.        Left eye: No discharge.     Extraocular Movements: Extraocular movements intact.  Cardiovascular:     Rate and Rhythm: Normal rate and regular rhythm.     Heart sounds: Normal heart sounds.  Pulmonary:     Effort: Pulmonary effort is normal.     Breath sounds: Normal breath sounds.  Abdominal:     Palpations: Abdomen is soft.     Tenderness: There is no abdominal tenderness.  Skin:    General: Skin is warm.     Findings: No rash.  Neurological:     General: No focal deficit present.     Mental Status: He is alert.     Cranial Nerves: No cranial nerve deficit.  Psychiatric:        Mood and Affect: Mood normal.        Behavior: Behavior normal.        Thought Content: Thought content normal.        Judgment: Judgment normal.     No results found.       Beverley KATHEE Hummer, MD  I,Emily Lagle,acting as a scribe for Beverley KATHEE Hummer, MD.,have documented all relevant documentation on the behalf of Beverley KATHEE Hummer, MD,as directed by  Beverley KATHEE Hummer, MD while in the presence of Beverley KATHEE Hummer, MD.   I, Beverley KATHEE Hummer, MD, have reviewed all documentation for this visit. The documentation on 03/28/2024 for the exam, diagnosis, procedures, and orders are all accurate and complete.

## 2024-03-28 NOTE — Patient Instructions (Signed)
  VISIT SUMMARY: Today, we reviewed your chronic conditions including heart failure, coronary artery disease, hyperlipidemia, and prediabetes. We also discussed your chronic venous insufficiency, nail changes, and history of smoking. Several tests were ordered to monitor your health, and medication refills were provided.  YOUR PLAN: CHRONIC HEART FAILURE WITH REDUCED EJECTION FRACTION (NYHA CLASS II): Your heart failure is well-managed with no current symptoms. Your blood pressure is controlled with your current medications. -Continue taking carvedilol  3.125 mg twice daily. -Continue taking sacubitril -valsartan  24/26 mg twice daily. -We will check your thyroid  function with a TSH with Reflex T4 test. -We will assess your heart function with a ProBNP test.  CORONARY ARTERY DISEASE WITH STABLE ANGINA AND HISTORY OF MYOCARDIAL INFARCTION AND STENT PLACEMENT: You have stable angina with occasional chest pain that is relieved by nitroglycerin . You are on dual antiplatelet therapy. -Refill nitroglycerin  0.4 mg sublingual as needed. -Continue taking aspirin  81 mg daily. -Continue taking clopidogrel  75 mg daily.  CHRONIC VENOUS INSUFFICIENCY WITH LEG ULCERS: Your leg ulcers are healed, but your legs remain sore. -Continue current care for your legs and monitor for any new symptoms.  HYPERLIPIDEMIA: Your cholesterol levels are being managed with medication. We will further assess your lipid profile and cardiovascular risk. -Continue taking rosuvastatin  20 mg daily. -We will check your lipid panel, ApoB, ApoA, and high sensitivity CRP.  MORBID OBESITY (BMI 37): Your BMI is 37. It is important to maintain a heart-healthy diet and engage in regular exercise. -Maintain a heart-healthy diet. -Engage in regular cardiovascular exercise.  PREDIABETES: We need to assess your current blood sugar levels and risk of developing diabetes. -We will check your hemoglobin A1c, C-peptide, and insulin   levels.  HISTORY OF SMOKING: You have a history of smoking and are due for lung cancer screening. -We will order a lung cancer screening.  VITAMIN D DEFICIENCY: We need to recheck your vitamin D levels. -We will check your vitamin D level.  HEMATURIA: You do not have visible blood in your urine, but we need to verify with urine tests. -We will check your urine microalbumin creatinine ratio and urine culture.  ONYCHOMYCOSIS (NAIL FUNGUS): You have thick, yellow nails suggestive of nail fungus. You are interested in seeing a podiatrist. -We will refer you to a podiatrist for management of your nail fungus.                      Contains text generated by [employer]bridge.                                 Contains text generated by Abridge.

## 2024-03-29 ENCOUNTER — Telehealth (HOSPITAL_BASED_OUTPATIENT_CLINIC_OR_DEPARTMENT_OTHER): Payer: Self-pay

## 2024-03-29 LAB — MICROALBUMIN / CREATININE URINE RATIO
Creatinine,U: 233.9 mg/dL
Microalb Creat Ratio: 7.9 mg/g (ref 0.0–30.0)
Microalb, Ur: 1.8 mg/dL (ref 0.0–1.9)

## 2024-03-29 LAB — LIPID PANEL
Cholesterol: 118 mg/dL (ref 0–200)
HDL: 36.2 mg/dL — ABNORMAL LOW (ref >39.00)
LDL Cholesterol: 71 mg/dL (ref 0–99)
NonHDL: 81.53
Total CHOL/HDL Ratio: 3
Triglycerides: 55 mg/dL (ref 0.0–149.0)
VLDL: 11 mg/dL (ref 0.0–40.0)

## 2024-03-29 LAB — COMPREHENSIVE METABOLIC PANEL WITH GFR
ALT: 13 U/L (ref 0–53)
AST: 16 U/L (ref 0–37)
Albumin: 4 g/dL (ref 3.5–5.2)
Alkaline Phosphatase: 72 U/L (ref 39–117)
BUN: 18 mg/dL (ref 6–23)
CO2: 26 meq/L (ref 19–32)
Calcium: 9.1 mg/dL (ref 8.4–10.5)
Chloride: 102 meq/L (ref 96–112)
Creatinine, Ser: 1.16 mg/dL (ref 0.40–1.50)
GFR: 61.57 mL/min (ref >60.00)
Glucose, Bld: 106 mg/dL — ABNORMAL HIGH (ref 70–99)
Potassium: 4.3 meq/L (ref 3.5–5.1)
Sodium: 137 meq/L (ref 135–145)
Total Bilirubin: 0.5 mg/dL (ref 0.2–1.2)
Total Protein: 6.9 g/dL (ref 6.0–8.3)

## 2024-03-29 LAB — CBC WITH DIFFERENTIAL/PLATELET
Basophils Absolute: 0.1 K/uL (ref 0.0–0.1)
Basophils Relative: 0.8 % (ref 0.0–3.0)
Eosinophils Absolute: 0.2 K/uL (ref 0.0–0.7)
Eosinophils Relative: 2 % (ref 0.0–5.0)
HCT: 41.5 % (ref 39.0–52.0)
Hemoglobin: 13.7 g/dL (ref 13.0–17.0)
Lymphocytes Relative: 16.4 % (ref 12.0–46.0)
Lymphs Abs: 1.7 K/uL (ref 0.7–4.0)
MCHC: 33.1 g/dL (ref 30.0–36.0)
MCV: 87 fl (ref 78.0–100.0)
Monocytes Absolute: 0.9 K/uL (ref 0.1–1.0)
Monocytes Relative: 8.6 % (ref 3.0–12.0)
Neutro Abs: 7.3 K/uL (ref 1.4–7.7)
Neutrophils Relative %: 72.2 % (ref 43.0–77.0)
Platelets: 178 K/uL (ref 150.0–400.0)
RBC: 4.77 Mil/uL (ref 4.22–5.81)
RDW: 15.4 % (ref 11.5–15.5)
WBC: 10.2 K/uL (ref 4.0–10.5)

## 2024-03-29 LAB — B12 AND FOLATE PANEL
Folate: >23.7 ng/mL (ref >5.9)
Vitamin B-12: 617 pg/mL (ref 211–911)

## 2024-03-29 LAB — URINALYSIS, MICROSCOPIC ONLY

## 2024-03-29 LAB — APO A1 + B + RATIO
Apolipo. B/A-1 Ratio: 0.7 ratio (ref 0.0–0.7)
Apolipoprotein A-1: 108 mg/dL (ref 101–178)
Apolipoprotein B: 73 mg/dL (ref <90)

## 2024-03-29 LAB — PRO B NATRIURETIC PEPTIDE: NT-Pro BNP: 233 pg/mL (ref 0–486)

## 2024-03-29 LAB — CK: Total CK: 47 U/L (ref 17–232)

## 2024-03-29 LAB — MAGNESIUM: Magnesium: 2.1 mg/dL (ref 1.5–2.5)

## 2024-03-29 LAB — HIGH SENSITIVITY CRP: CRP, High Sensitivity: 31.49 mg/L — ABNORMAL HIGH (ref 0.000–5.000)

## 2024-03-29 LAB — TSH RFX ON ABNORMAL TO FREE T4: TSH: 1.07 u[IU]/mL (ref 0.450–4.500)

## 2024-03-29 LAB — HEMOGLOBIN A1C: Hgb A1c MFr Bld: 6.6 % — ABNORMAL HIGH (ref 4.6–6.5)

## 2024-03-31 DIAGNOSIS — Z87448 Personal history of other diseases of urinary system: Secondary | ICD-10-CM | POA: Insufficient documentation

## 2024-03-31 DIAGNOSIS — E559 Vitamin D deficiency, unspecified: Secondary | ICD-10-CM | POA: Insufficient documentation

## 2024-03-31 DIAGNOSIS — R7303 Prediabetes: Secondary | ICD-10-CM | POA: Insufficient documentation

## 2024-04-03 ENCOUNTER — Ambulatory Visit: Admitting: Podiatry

## 2024-04-04 LAB — VITAMIN D 1,25 DIHYDROXY
Vitamin D 1, 25 (OH)2 Total: 47 pg/mL (ref 18–72)
Vitamin D2 1, 25 (OH)2: <8 pg/mL
Vitamin D3 1, 25 (OH)2: 47 pg/mL

## 2024-04-04 LAB — URINE CULTURE
MICRO NUMBER:: 17169873
Result:: NO GROWTH
SPECIMEN QUALITY:: ADEQUATE

## 2024-04-04 LAB — IRON,TIBC AND FERRITIN PANEL
%SAT: 9 % — ABNORMAL LOW (ref 20–48)
Ferritin: 157 ng/mL (ref 24–380)
Iron: 157 ug/dL — AB (ref 50–380)
TIBC: 323 ug/dL (ref 250–425)

## 2024-04-04 LAB — C-PEPTIDE: C-Peptide: 15.17 ng/mL — ABNORMAL HIGH (ref 0.80–3.85)

## 2024-04-04 LAB — INSULIN, RANDOM: Insulin: 33.8 u[IU]/mL — ABNORMAL HIGH

## 2024-04-05 ENCOUNTER — Ambulatory Visit: Payer: Self-pay | Admitting: Family Medicine

## 2024-04-05 DIAGNOSIS — I714 Abdominal aortic aneurysm, without rupture, unspecified: Secondary | ICD-10-CM

## 2024-04-05 DIAGNOSIS — R911 Solitary pulmonary nodule: Secondary | ICD-10-CM

## 2024-04-08 ENCOUNTER — Ambulatory Visit: Admitting: Podiatry

## 2024-04-15 ENCOUNTER — Ambulatory Visit (INDEPENDENT_AMBULATORY_CARE_PROVIDER_SITE_OTHER): Admitting: Podiatry

## 2024-04-15 ENCOUNTER — Encounter: Payer: Self-pay | Admitting: Podiatry

## 2024-04-15 VITALS — Ht 67.0 in | Wt 239.0 lb

## 2024-04-15 DIAGNOSIS — M79674 Pain in right toe(s): Secondary | ICD-10-CM

## 2024-04-15 DIAGNOSIS — B351 Tinea unguium: Secondary | ICD-10-CM | POA: Diagnosis not present

## 2024-04-15 DIAGNOSIS — M79675 Pain in left toe(s): Secondary | ICD-10-CM | POA: Diagnosis not present

## 2024-04-15 NOTE — Progress Notes (Signed)
   Chief Complaint  Patient presents with   Nail Problem    Pt is here due to left great toenail states the nail is cutting into the skin, wants to have it trimmed.    SUBJECTIVE Patient presents to office today complaining of elongated, thickened nails that cause pain while ambulating in shoes.  Patient is unable to trim their own nails. Patient is here for further evaluation and treatment.  Past Medical History:  Diagnosis Date   Aneurysm of left ventricle of heart 04/12/2017   Angina pectoris 11/24/2022   Anoxic brain injury (HCC)    CAD in native artery    CHF (congestive heart failure), NYHA class II, acute, combined (HCC) 04/11/2017   Chronic neck pain    Chronic pain of both knees 11/24/2022   Chronic systolic (congestive) heart failure (HCC)    Class 2 severe obesity with serious comorbidity and body mass index (BMI) of 39.0 to 39.9 in adult 11/24/2022   Confusion    Congestive heart failure (CHF) (HCC)    Coronary artery disease    Degeneration of lumbar intervertebral disc 11/02/2017   Disseminated idiopathic skeletal hyperostosis 11/02/2017   Dog bite of forearm 09/20/2017   Dysphagia    Elevated serum creatinine 10/06/2017   Encounter for management of intra-aortic balloon pump    Encounter for screening colonoscopy    Essential hypertension    History of tobacco use 02/18/2021   Hyperlipidemia 11/24/2022   Hypertension    Iron  deficiency anemia 10/06/2017   Muscle cramps 02/18/2021   Penile discharge 09/26/2018   Physical debility 04/27/2017   Post-COVID syndrome 02/06/2023   STEMI (ST elevation myocardial infarction) (HCC) 04/11/2017   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 Derm Skin is dry and supple bilateral. Negative open lesions or macerations. Remaining integument unremarkable. Nails are tender, long, thickened and dystrophic with subungual debris, consistent with onychomycosis, 1-5 bilateral. No signs of infection noted. Vasc  DP and PT pedal pulses palpable bilaterally. Temperature gradient within normal limits.  Neuro Epicritic and protective threshold sensation grossly intact bilaterally.  Musculoskeletal Exam No symptomatic pedal deformities noted bilateral. Muscular strength within normal limits.  ASSESSMENT 1.  Pain due to onychomycosis of toenails both  PLAN OF CARE 1. Patient evaluated today.  2. Instructed to maintain good pedal hygiene and foot care.  3. Mechanical debridement of nails 1-5 bilaterally performed using a nail nipper. Filed with dremel without incident.  4. Return to clinic in 3 mos.    Thresa EMERSON Sar, DPM [facility]  Dr. Thresa EMERSON Sar, DPM    [address] [place], [place] [address]                Office [phone]  Fax [phone]

## 2024-04-16 ENCOUNTER — Telehealth (HOSPITAL_BASED_OUTPATIENT_CLINIC_OR_DEPARTMENT_OTHER): Payer: Self-pay

## 2024-05-15 ENCOUNTER — Ambulatory Visit: Admitting: Family Medicine

## 2024-06-03 ENCOUNTER — Encounter: Payer: Self-pay | Admitting: Family Medicine

## 2024-06-03 ENCOUNTER — Ambulatory Visit: Admitting: Family Medicine

## 2024-06-07 ENCOUNTER — Inpatient Hospital Stay (HOSPITAL_BASED_OUTPATIENT_CLINIC_OR_DEPARTMENT_OTHER): Admission: RE | Admit: 2024-06-07 | Source: Ambulatory Visit | Admitting: Radiology

## 2024-06-07 ENCOUNTER — Ambulatory Visit (HOSPITAL_BASED_OUTPATIENT_CLINIC_OR_DEPARTMENT_OTHER): Admitting: Radiology

## 2024-06-11 ENCOUNTER — Ambulatory Visit (HOSPITAL_BASED_OUTPATIENT_CLINIC_OR_DEPARTMENT_OTHER): Admission: RE | Admit: 2024-06-11 | Discharge: 2024-06-11 | Attending: Family Medicine

## 2024-06-11 ENCOUNTER — Ambulatory Visit (HOSPITAL_BASED_OUTPATIENT_CLINIC_OR_DEPARTMENT_OTHER)
Admission: RE | Admit: 2024-06-11 | Discharge: 2024-06-11 | Disposition: A | Discharge status: Home / Self Care | Source: Ambulatory Visit | Attending: Family Medicine | Admitting: Family Medicine

## 2024-06-11 DIAGNOSIS — R918 Other nonspecific abnormal finding of lung field: Secondary | ICD-10-CM | POA: Diagnosis not present

## 2024-06-11 DIAGNOSIS — I700 Atherosclerosis of aorta: Secondary | ICD-10-CM | POA: Diagnosis not present

## 2024-06-11 DIAGNOSIS — I77811 Abdominal aortic ectasia: Secondary | ICD-10-CM | POA: Insufficient documentation

## 2024-06-11 DIAGNOSIS — Z87891 Personal history of nicotine dependence: Secondary | ICD-10-CM | POA: Insufficient documentation

## 2024-06-11 DIAGNOSIS — Z136 Encounter for screening for cardiovascular disorders: Secondary | ICD-10-CM | POA: Insufficient documentation

## 2024-06-11 DIAGNOSIS — J439 Emphysema, unspecified: Secondary | ICD-10-CM | POA: Diagnosis not present

## 2024-06-11 DIAGNOSIS — I253 Aneurysm of heart: Secondary | ICD-10-CM | POA: Diagnosis not present

## 2024-06-11 DIAGNOSIS — Z122 Encounter for screening for malignant neoplasm of respiratory organs: Secondary | ICD-10-CM | POA: Insufficient documentation

## 2024-06-12 DIAGNOSIS — I714 Abdominal aortic aneurysm, without rupture, unspecified: Secondary | ICD-10-CM | POA: Insufficient documentation

## 2024-06-12 NOTE — Addendum Note (Signed)
 Addended by: SEBASTIAN CROCK B on: 06/12/2024 03:57 PM   Modules accepted: Orders

## 2024-06-13 NOTE — Progress Notes (Signed)
 ATC patient to get him scheduled. LVMTCB

## 2024-06-20 NOTE — Addendum Note (Signed)
 Addended by: SEBASTIAN CROCK B on: 06/20/2024 01:14 PM   Modules accepted: Orders

## 2024-06-21 NOTE — Telephone Encounter (Signed)
 This encounter was created in error - please disregard.

## 2024-07-15 ENCOUNTER — Encounter (HOSPITAL_BASED_OUTPATIENT_CLINIC_OR_DEPARTMENT_OTHER): Admitting: General Surgery

## 2024-07-17 ENCOUNTER — Ambulatory Visit: Admitting: Vascular Surgery

## 2024-09-26 ENCOUNTER — Ambulatory Visit: Admitting: Family Medicine
